# Patient Record
Sex: Male | Born: 1935
Health system: Southern US, Community
[De-identification: ages and names within clinical notes are randomized; demographics above are authoritative.]

## PROBLEM LIST (undated history)

## (undated) DIAGNOSIS — G4700 Insomnia, unspecified: Secondary | ICD-10-CM

## (undated) DIAGNOSIS — I491 Atrial premature depolarization: Secondary | ICD-10-CM

## (undated) DIAGNOSIS — D497 Neoplasm of unspecified behavior of endocrine glands and other parts of nervous system: Secondary | ICD-10-CM

## (undated) DIAGNOSIS — L309 Dermatitis, unspecified: Secondary | ICD-10-CM

## (undated) DIAGNOSIS — Z955 Presence of coronary angioplasty implant and graft: Secondary | ICD-10-CM

## (undated) DIAGNOSIS — M545 Low back pain, unspecified: Secondary | ICD-10-CM

## (undated) DIAGNOSIS — N400 Enlarged prostate without lower urinary tract symptoms: Secondary | ICD-10-CM

## (undated) DIAGNOSIS — M1711 Unilateral primary osteoarthritis, right knee: Secondary | ICD-10-CM

## (undated) DIAGNOSIS — E559 Vitamin D deficiency, unspecified: Secondary | ICD-10-CM

## (undated) DIAGNOSIS — I509 Heart failure, unspecified: Secondary | ICD-10-CM

## (undated) DIAGNOSIS — I2510 Atherosclerotic heart disease of native coronary artery without angina pectoris: Secondary | ICD-10-CM

## (undated) DIAGNOSIS — G8929 Other chronic pain: Secondary | ICD-10-CM

## (undated) DIAGNOSIS — M5136 Other intervertebral disc degeneration, lumbar region: Secondary | ICD-10-CM

## (undated) DIAGNOSIS — K635 Polyp of colon: Secondary | ICD-10-CM

## (undated) DIAGNOSIS — T7849XA Other allergy, initial encounter: Secondary | ICD-10-CM

## (undated) DIAGNOSIS — M51369 Other intervertebral disc degeneration, lumbar region without mention of lumbar back pain or lower extremity pain: Secondary | ICD-10-CM

## (undated) DIAGNOSIS — R413 Other amnesia: Secondary | ICD-10-CM

## (undated) DIAGNOSIS — R339 Retention of urine, unspecified: Secondary | ICD-10-CM

## (undated) DIAGNOSIS — N281 Cyst of kidney, acquired: Secondary | ICD-10-CM

## (undated) DIAGNOSIS — E785 Hyperlipidemia, unspecified: Secondary | ICD-10-CM

## (undated) DIAGNOSIS — I428 Other cardiomyopathies: Secondary | ICD-10-CM

## (undated) DIAGNOSIS — I214 Non-ST elevation (NSTEMI) myocardial infarction: Secondary | ICD-10-CM

## (undated) DIAGNOSIS — R768 Other specified abnormal immunological findings in serum: Secondary | ICD-10-CM

## (undated) DIAGNOSIS — H269 Unspecified cataract: Secondary | ICD-10-CM

## (undated) HISTORY — DX: Other chronic pain: G89.29

## (undated) HISTORY — DX: Unilateral primary osteoarthritis, right knee: M17.11

## (undated) HISTORY — DX: Cyst of kidney, acquired: N28.1

## (undated) HISTORY — DX: Hyperlipidemia, unspecified: E78.5

## (undated) HISTORY — DX: Other amnesia: R41.3

## (undated) HISTORY — DX: Other intervertebral disc degeneration, lumbar region without mention of lumbar back pain or lower extremity pain: M51.369

## (undated) HISTORY — DX: Other allergy, initial encounter: T78.49XA

## (undated) HISTORY — DX: Benign prostatic hyperplasia without lower urinary tract symptoms: N40.0

## (undated) HISTORY — DX: Atrial premature depolarization: I49.1

## (undated) HISTORY — DX: Retention of urine, unspecified: R33.9

## (undated) HISTORY — PX: OTHER SURGICAL HISTORY: SHX169

## (undated) HISTORY — DX: Polyp of colon: K63.5

## (undated) HISTORY — DX: Low back pain, unspecified: M54.50

## (undated) HISTORY — DX: Unspecified cataract: H26.9

## (undated) HISTORY — DX: Other intervertebral disc degeneration, lumbar region: M51.36

## (undated) HISTORY — DX: Dermatitis, unspecified: L30.9

## (undated) HISTORY — DX: Neoplasm of unspecified behavior of endocrine glands and other parts of nervous system: D49.7

## (undated) HISTORY — DX: Vitamin D deficiency, unspecified: E55.9

## (undated) HISTORY — DX: Atherosclerotic heart disease of native coronary artery without angina pectoris: I25.10

## (undated) HISTORY — DX: Other cardiomyopathies: I42.8

## (undated) HISTORY — DX: Presence of coronary angioplasty implant and graft: Z95.5

## (undated) HISTORY — PX: HAND SURGERY: SHX662

## (undated) HISTORY — PX: PITUITARY SURGERY: SHX203

## (undated) HISTORY — DX: Other specified abnormal immunological findings in serum: R76.8

## (undated) HISTORY — DX: Insomnia, unspecified: G47.00

## (undated) HISTORY — PX: BACK SURGERY: SHX140

## (undated) HISTORY — DX: Low back pain: M54.5

## (undated) HISTORY — PX: CATARACT EXTRACTION: SUR2

---

## 2000-03-13 ENCOUNTER — Encounter: Payer: Self-pay | Admitting: Orthopedic Surgery

## 2000-03-13 ENCOUNTER — Ambulatory Visit (HOSPITAL_COMMUNITY): Admission: RE | Admit: 2000-03-13 | Discharge: 2000-03-13 | Payer: Self-pay | Admitting: Orthopedic Surgery

## 2000-03-18 ENCOUNTER — Ambulatory Visit (HOSPITAL_COMMUNITY): Admission: RE | Admit: 2000-03-18 | Discharge: 2000-03-18 | Payer: Self-pay | Admitting: Orthopedic Surgery

## 2000-03-18 ENCOUNTER — Encounter: Payer: Self-pay | Admitting: Orthopedic Surgery

## 2003-03-01 ENCOUNTER — Emergency Department (HOSPITAL_COMMUNITY): Admission: EM | Admit: 2003-03-01 | Discharge: 2003-03-01 | Payer: Self-pay | Admitting: Emergency Medicine

## 2006-07-10 ENCOUNTER — Encounter: Admission: RE | Admit: 2006-07-10 | Discharge: 2006-07-10 | Payer: Self-pay | Admitting: Sports Medicine

## 2006-09-19 ENCOUNTER — Encounter: Admission: RE | Admit: 2006-09-19 | Discharge: 2006-09-19 | Payer: Self-pay | Admitting: Sports Medicine

## 2006-09-23 ENCOUNTER — Ambulatory Visit (HOSPITAL_COMMUNITY): Admission: RE | Admit: 2006-09-23 | Discharge: 2006-09-23 | Payer: Self-pay | Admitting: Neurological Surgery

## 2006-09-25 ENCOUNTER — Ambulatory Visit (HOSPITAL_COMMUNITY): Admission: RE | Admit: 2006-09-25 | Discharge: 2006-09-26 | Payer: Self-pay | Admitting: Neurological Surgery

## 2006-09-29 ENCOUNTER — Emergency Department (HOSPITAL_COMMUNITY): Admission: EM | Admit: 2006-09-29 | Discharge: 2006-09-29 | Payer: Self-pay | Admitting: Emergency Medicine

## 2006-12-04 ENCOUNTER — Ambulatory Visit (HOSPITAL_COMMUNITY): Admission: RE | Admit: 2006-12-04 | Discharge: 2006-12-04 | Payer: Self-pay | Admitting: Internal Medicine

## 2007-10-29 ENCOUNTER — Encounter: Admission: RE | Admit: 2007-10-29 | Discharge: 2007-10-29 | Payer: Self-pay | Admitting: Internal Medicine

## 2007-10-29 ENCOUNTER — Encounter: Admission: RE | Admit: 2007-10-29 | Discharge: 2007-10-29 | Payer: Self-pay | Admitting: Neurological Surgery

## 2008-01-06 ENCOUNTER — Encounter: Admission: RE | Admit: 2008-01-06 | Discharge: 2008-01-06 | Payer: Self-pay | Admitting: Cardiology

## 2008-01-12 ENCOUNTER — Inpatient Hospital Stay (HOSPITAL_BASED_OUTPATIENT_CLINIC_OR_DEPARTMENT_OTHER): Admission: RE | Admit: 2008-01-12 | Discharge: 2008-01-12 | Payer: Self-pay | Admitting: Cardiology

## 2008-01-30 DIAGNOSIS — E559 Vitamin D deficiency, unspecified: Secondary | ICD-10-CM

## 2008-01-30 HISTORY — DX: Vitamin D deficiency, unspecified: E55.9

## 2008-09-03 ENCOUNTER — Ambulatory Visit: Payer: Self-pay | Admitting: Cardiology

## 2008-09-06 ENCOUNTER — Encounter: Payer: Self-pay | Admitting: Cardiology

## 2008-09-06 ENCOUNTER — Ambulatory Visit: Payer: Self-pay

## 2008-09-22 ENCOUNTER — Ambulatory Visit: Payer: Self-pay | Admitting: Cardiology

## 2009-01-10 ENCOUNTER — Emergency Department (HOSPITAL_COMMUNITY): Admission: EM | Admit: 2009-01-10 | Discharge: 2009-01-10 | Payer: Self-pay | Admitting: Emergency Medicine

## 2009-02-04 ENCOUNTER — Encounter: Payer: Self-pay | Admitting: Cardiology

## 2009-02-10 ENCOUNTER — Ambulatory Visit: Payer: Self-pay | Admitting: Cardiology

## 2009-02-10 DIAGNOSIS — I459 Conduction disorder, unspecified: Secondary | ICD-10-CM | POA: Insufficient documentation

## 2009-02-10 DIAGNOSIS — I2510 Atherosclerotic heart disease of native coronary artery without angina pectoris: Secondary | ICD-10-CM | POA: Insufficient documentation

## 2009-02-10 DIAGNOSIS — I429 Cardiomyopathy, unspecified: Secondary | ICD-10-CM | POA: Insufficient documentation

## 2009-02-10 DIAGNOSIS — E7800 Pure hypercholesterolemia, unspecified: Secondary | ICD-10-CM | POA: Insufficient documentation

## 2009-02-10 DIAGNOSIS — I498 Other specified cardiac arrhythmias: Secondary | ICD-10-CM | POA: Insufficient documentation

## 2009-02-17 ENCOUNTER — Ambulatory Visit: Payer: Self-pay | Admitting: Cardiology

## 2009-07-13 ENCOUNTER — Encounter
Admission: RE | Admit: 2009-07-13 | Discharge: 2009-07-15 | Payer: Self-pay | Admitting: Physical Medicine & Rehabilitation

## 2009-07-15 ENCOUNTER — Ambulatory Visit: Payer: Self-pay | Admitting: Physical Medicine & Rehabilitation

## 2009-08-03 ENCOUNTER — Ambulatory Visit: Payer: Self-pay

## 2009-08-03 ENCOUNTER — Ambulatory Visit (HOSPITAL_COMMUNITY): Admission: RE | Admit: 2009-08-03 | Discharge: 2009-08-03 | Payer: Self-pay | Admitting: Cardiology

## 2009-08-03 ENCOUNTER — Encounter: Payer: Self-pay | Admitting: Cardiology

## 2009-08-03 ENCOUNTER — Ambulatory Visit: Payer: Self-pay | Admitting: Cardiovascular Disease

## 2009-08-17 ENCOUNTER — Ambulatory Visit: Payer: Self-pay | Admitting: Cardiology

## 2009-10-01 DIAGNOSIS — D497 Neoplasm of unspecified behavior of endocrine glands and other parts of nervous system: Secondary | ICD-10-CM

## 2009-10-01 HISTORY — DX: Neoplasm of unspecified behavior of endocrine glands and other parts of nervous system: D49.7

## 2009-11-15 ENCOUNTER — Encounter: Admission: RE | Admit: 2009-11-15 | Discharge: 2009-11-15 | Payer: Self-pay | Admitting: Neurology

## 2009-11-30 ENCOUNTER — Telehealth: Payer: Self-pay | Admitting: Cardiology

## 2009-12-01 ENCOUNTER — Encounter (INDEPENDENT_AMBULATORY_CARE_PROVIDER_SITE_OTHER): Payer: Self-pay | Admitting: Neurological Surgery

## 2009-12-01 ENCOUNTER — Inpatient Hospital Stay (HOSPITAL_COMMUNITY): Admission: RE | Admit: 2009-12-01 | Discharge: 2009-12-07 | Payer: Self-pay | Admitting: Neurological Surgery

## 2010-01-05 ENCOUNTER — Ambulatory Visit (HOSPITAL_COMMUNITY): Admission: RE | Admit: 2010-01-05 | Discharge: 2010-01-05 | Payer: Self-pay | Admitting: Internal Medicine

## 2010-02-20 ENCOUNTER — Encounter: Admission: RE | Admit: 2010-02-20 | Discharge: 2010-02-20 | Payer: Self-pay | Admitting: Neurological Surgery

## 2010-10-23 ENCOUNTER — Ambulatory Visit
Admission: RE | Admit: 2010-10-23 | Discharge: 2010-10-23 | Payer: Self-pay | Source: Home / Self Care | Attending: Cardiology | Admitting: Cardiology

## 2010-10-23 ENCOUNTER — Encounter: Payer: Self-pay | Admitting: Cardiology

## 2010-10-27 ENCOUNTER — Encounter: Payer: Self-pay | Admitting: Cardiology

## 2010-10-31 NOTE — Progress Notes (Signed)
Summary: surgery  Phone Note Call from Patient Call back at Home Phone [phone]   Caller: Patient Reason for Call: Talk to Nurse Summary of Call: having surgery on 3/3 @ [facility], pituitary gland Initial call taken by: Migdalia Dk,  November 30, 2009 1:24 PM  Follow-up for Phone Call        I spoke with the pt and he has been having vision loss.  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  Dr Annalee Genta and Dr [name] will be performing 3 hour surgery on 12/01/09 at [facility].  The pt will be hospitalized 3-4 days. The pt wanted Dr Riley Kill to know this information.  Follow-up by: Julieta Gutting, RN, BSN,  November 30, 2009 2:50 PM  Additional Follow-up for Phone Call Additional follow up Details #1::        Noted.  Will check on patient at the hospital Additional Follow-up by: Ronaldo Miyamoto, MD, [facility],  December 01, 2009 4:59 PM

## 2010-11-02 NOTE — Assessment & Plan Note (Signed)
Summary: 1 YR F/U   Visit Type:  1 year follow up Primary Provider:  Dr.Perini  CC:  No complaints.  History of Present Illness: He is getting along really well at this point.  He had pituitary surgery, and he is gotten over that.  In light of his symptoms, Dr. Waynard Edwards was reconsidering his anti lipemic treatment, but the patient still has medication related side effects, and is very sensitive to most medications.   Problems Prior to Update: 1)  Cardiomyopathy, Primary, Dilated  (ICD-425.4) 2)  Cad, Native Vessel  (ICD-414.01) 3)  Hypercholesterolemia Iia  (ICD-272.0) 4)  Bradycardia  (ICD-427.89)  Current Medications (verified): 1)  Lovaza 1 Gm Caps (Omega-3-Acid Ethyl Esters) .... 4 Once Daily 2)  Finasteride 5 Mg Tabs (Finasteride) .... Take 1 Tablet By Mouth Once A Day 3)  Aspirin 81 Mg Tbec (Aspirin) .... Take One Tablet By Mouth Daily 4)  Vitamin D 50,000 .... Once A Week 5)  Glucosamine Sulfate   Powd (Glucosamine Sulfate) .Marland Kitchen.. 1 By Mouth Once Daily 6)  Cinnamon 500 Mg Caps (Cinnamon) .Marland Kitchen.. 1 By Mouth Once Daily 7)  Mega Multi Men  Cr-Tabs (Multiple Vitamins-Minerals) .Marland Kitchen.. 1 By Mouth Once Daily 8)  Coq10 200 Mg Caps (Coenzyme Q10) .... Take 1 Capsule By Mouth Once A Day  Allergies: 1)  ! * Flomax 2)  ! Sulfa  Past History:  Past Medical History: Last updated: 09/30/2008 hyperlipidemia Benign Prostatic Hypertrophy Non ischemic cardiomyopathy Atrial premature beats Coronary Artery Disease  Past Surgical History: Left hand surgery cataract extraction prior back surgery cataract extraction arthroscopic surgery bunion and hammar toe surgery pituitary surgery  Family History: Reviewed history from 09/30/2008 and no changes required. Father age 72 deceased CHF Mother age 65 deceased old age Brother  history of CAD Sister deceased 66 polio Brother and sister alive  Social History: Reviewed history from 09/30/2008 and no changes required. Nonsmoker, married,  retired Associate Professor  Vital Signs:  Patient profile:   77 year old male Height:      75 inches Weight:      253.75 pounds BMI:     31.83 Pulse rate:   75 / minute Pulse rhythm:   irregular Resp:     18 per minute BP sitting:   128 / 82  (left arm) Cuff size:   large  Vitals Entered By: Vikki Ports (October 23, 2010 9:21 AM)  Physical Exam  General:  Well developed, well nourished, in no acute distress. Head:  normocephalic and atraumatic Eyes:  PERRLA/EOM intact; conjunctiva and lids normal. Lungs:  Clear bilaterally to auscultation and percussion. Heart:  PMI non displaced. NOrmal S1 and S2.  S4 gallop.  No definite murmur. Pulses:  pulses normal in all 4 extremities Extremities:  No clubbing or cyanosis. Neurologic:  Alert and oriented x 3.   EKG  Procedure date:  10/23/2010  Findings:      NSR  Atrial bigeminy and PACs  Impression & Recommendations:  Problem # 1:  CARDIOMYOPATHY, PRIMARY, DILATED (ICD-425.4) Out of proportion to CAD findings.  Continue to monitor.  Will need repeat echocardiogram to follow. His updated medication list for this problem includes:    Aspirin 81 Mg Tbec (Aspirin) .Marland Kitchen... Take one tablet by mouth daily  Orders: EKG w/ Interpretation (93000)  Problem # 2:  CAD, NATIVE VESSEL (ICD-414.01) Has been stable.  Unable to take statins.  continues on ASA His updated medication list for this problem includes:    Aspirin 81 Mg Tbec (  Aspirin) .Marland Kitchen... Take one tablet by mouth daily  Orders: EKG w/ Interpretation (93000)  Problem # 3:  HYPERCHOLESTEROLEMIA  IIA (ICD-272.0) difficult treatment issues secondary to medication side effects. His updated medication list for this problem includes:    Lovaza 1 Gm Caps (Omega-3-acid ethyl esters) .Marland KitchenMarland KitchenMarland KitchenMarland Kitchen 4 once daily  Orders: EKG w/ Interpretation (93000)  Problem # 4:  BRADYCARDIA (ICD-427.89) not currently a clinical issue. His updated medication list for this problem includes:    Aspirin 81 Mg  Tbec (Aspirin) .Marland Kitchen... Take one tablet by mouth daily  Orders: EKG w/ Interpretation (93000)  Patient Instructions: 1)  Your physician recommends that you continue on your current medications as directed. Please refer to the Current Medication list given to you today. 2)  Your physician wants you to follow-up in: 6 MONTHS.  You will receive a reminder letter in the mail two months in advance. If you don't receive a letter, please call our office to schedule the follow-up appointment. 3)  Your physician has requested that you have an echocardiogram (prior to orthopaedic surgery--please call the office once surgery is scheduled).  Echocardiography is a painless test that uses sound waves to create images of your heart. It provides your doctor with information about the size and shape of your heart and how well your heart's chambers and valves are working.  This procedure takes approximately one hour. There are no restrictions for this procedure.

## 2010-12-24 LAB — CBC
HCT: 37.6 % — ABNORMAL LOW (ref 39.0–52.0)
HCT: 43.1 % (ref 39.0–52.0)
HCT: 45.3 % (ref 39.0–52.0)
Hemoglobin: 13.1 g/dL (ref 13.0–17.0)
Hemoglobin: 15 g/dL (ref 13.0–17.0)
Hemoglobin: 15.1 g/dL (ref 13.0–17.0)
MCHC: 34.3 g/dL (ref 30.0–36.0)
MCHC: 34.9 g/dL (ref 30.0–36.0)
MCV: 96.2 fL (ref 78.0–100.0)
Platelets: 174 10*3/uL (ref 150–400)
Platelets: 174 10*3/uL (ref 150–400)
RBC: 4.11 MIL/uL — ABNORMAL LOW (ref 4.22–5.81)
RBC: 4.45 MIL/uL (ref 4.22–5.81)
RDW: 13.6 % (ref 11.5–15.5)
RDW: 13.9 % (ref 11.5–15.5)
WBC: 14.1 10*3/uL — ABNORMAL HIGH (ref 4.0–10.5)
WBC: 7.3 10*3/uL (ref 4.0–10.5)

## 2010-12-24 LAB — BASIC METABOLIC PANEL
BUN: 10 mg/dL (ref 6–23)
BUN: 10 mg/dL (ref 6–23)
CO2: 14 mEq/L — ABNORMAL LOW (ref 19–32)
CO2: 26 mEq/L (ref 19–32)
CO2: 29 mEq/L (ref 19–32)
Calcium: 8.4 mg/dL (ref 8.4–10.5)
Calcium: 8.6 mg/dL (ref 8.4–10.5)
Calcium: 9.8 mg/dL (ref 8.4–10.5)
Chloride: 102 mEq/L (ref 96–112)
Chloride: 104 mEq/L (ref 96–112)
Chloride: 107 mEq/L (ref 96–112)
Creatinine, Ser: 0.85 mg/dL (ref 0.4–1.5)
Creatinine, Ser: 0.9 mg/dL (ref 0.4–1.5)
Creatinine, Ser: 0.94 mg/dL (ref 0.4–1.5)
GFR calc Af Amer: >60 mL/min (ref >60)
GFR calc non Af Amer: >60 mL/min (ref >60)
GFR calc non Af Amer: >60 mL/min (ref >60)
Glucose, Bld: 150 mg/dL — ABNORMAL HIGH (ref 70–99)
Glucose, Bld: 98 mg/dL (ref 70–99)
Potassium: 4.4 mEq/L (ref 3.5–5.1)
Sodium: 139 mEq/L (ref 135–145)

## 2011-01-01 ENCOUNTER — Encounter: Payer: Self-pay | Admitting: Cardiology

## 2011-01-12 ENCOUNTER — Other Ambulatory Visit (HOSPITAL_COMMUNITY): Payer: Self-pay | Admitting: Cardiology

## 2011-01-12 DIAGNOSIS — I428 Other cardiomyopathies: Secondary | ICD-10-CM

## 2011-01-15 ENCOUNTER — Ambulatory Visit (HOSPITAL_COMMUNITY): Payer: Medicare Other | Attending: Cardiology | Admitting: Radiology

## 2011-01-15 DIAGNOSIS — I428 Other cardiomyopathies: Secondary | ICD-10-CM

## 2011-01-15 DIAGNOSIS — I2510 Atherosclerotic heart disease of native coronary artery without angina pectoris: Secondary | ICD-10-CM

## 2011-01-15 DIAGNOSIS — Z01810 Encounter for preprocedural cardiovascular examination: Secondary | ICD-10-CM | POA: Insufficient documentation

## 2011-01-30 ENCOUNTER — Encounter: Payer: Self-pay | Admitting: Cardiology

## 2011-01-31 ENCOUNTER — Encounter: Payer: Self-pay | Admitting: Cardiology

## 2011-01-31 ENCOUNTER — Ambulatory Visit (INDEPENDENT_AMBULATORY_CARE_PROVIDER_SITE_OTHER): Payer: Medicare Other | Admitting: Cardiology

## 2011-01-31 VITALS — BP 144/72 | HR 67 | Resp 14 | Ht 75.0 in | Wt 254.8 lb

## 2011-01-31 DIAGNOSIS — E7800 Pure hypercholesterolemia, unspecified: Secondary | ICD-10-CM

## 2011-01-31 DIAGNOSIS — I2510 Atherosclerotic heart disease of native coronary artery without angina pectoris: Secondary | ICD-10-CM

## 2011-01-31 DIAGNOSIS — I428 Other cardiomyopathies: Secondary | ICD-10-CM

## 2011-01-31 NOTE — Patient Instructions (Signed)
Your physician recommends that you schedule a follow-up appointment in: 6 months with Dr. Stuckey   

## 2011-02-08 NOTE — Assessment & Plan Note (Signed)
See results of his echo.  His overall LVEF is not too far down.  He also has some RV dysfunction.  Fluid during surgery will need to be monitored closely.

## 2011-02-08 NOTE — Assessment & Plan Note (Signed)
Has not been able to take statins without significant side effects.  Dr. Waynard Edwards has worked closely with him on this.  No change.

## 2011-02-08 NOTE — Assessment & Plan Note (Signed)
The patient has class I NYHA symptoms.  He can exercise at the 4 mets level without symptoms.  He has known disease without symptoms.  Given a cath within a three year period, and echo done recently, no other workup is indicated.  I explained in part the futility of preop testing to ensure no events, and the guidelines per [facility] regarding this.  He has some inflammatory risk with the procedure, and has known disease without aggressive prevention due to lack of medication tolerance.  However, this is not per se a contraindication to proceeding. Quality of life is substantially impacted, and he has been working on other things as well.    I have spoken with Dr. Waynard Edwards who is aware of his needs for stress steroids.  Dr. [name] is also aware of this.  We would be glad to see him, although I will be away during his hospitalization.  [facility] will be available.

## 2011-02-08 NOTE — Progress Notes (Signed)
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  He is seen prior to having an orthopedic procedure, specifically a TKR.  He has been doing reasonably well. 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  He has no chest pain whatsoever.  The patient has significant medical issues.  Of note, he has had pituitary surgery, and is on replacement hormonal therapy that Dr. Waynard Edwards manages.  He denies any chest pain.  He has known CAD, and has not been able to take statins despite substantial hyperlipidemia.  A lot has been tried by Dr. Waynard Edwards over a period of time. He also has a mild cardiomyopathy out of proportion to his CAD findings.  These are noted as follows:  January 15, 2011:  Echo: Study Conclusions    - Left ventricle: Thereis inferior hypokinesis. The EF is in the     50-55% range. The cavity size was mildly dilated. Wall thickness     was increased in a pattern of mild LVH. Doppler parameters are     consistent with abnormal left ventricular relaxation (grade 1     diastolic dysfunction). Doppler parameters are consistent with     high ventricular filling pressure   He has had prior cath 01/12/2008 by Dr. Donnie Aho  .ANGIOGRAPHIC DATA:  Left ventriculogram:  Performed in the 30-degree RAO   projection.  The aortic valve is normal.  The mitral valve is normal.   There are frequent ventricular ectopic beats noted most likely PACs.   There was mild diffuse hypokinesis noted with an estimated ejection   fraction between 40% and 45% with some accentuation of the EF post   premature beat.  Coronary arteries arise and distribute normally.  There   was calcification noted in the left coronary system.  The left main   coronary artery is normal.  The left anterior descending is calcified   proximally.  There is an area of diffuse narrowing in the proximal and   mid segment.  There is a focal area, which is eccentric seen best in the   LAO cranial  view estimated at no greater than 50%.  There is diffuse   disease in the midportion estimated at 30%.  There is a focal eccentric   stenosis at the very distal apex estimated at 60%.  Circumflex coronary   artery contains moderate irregularity, but no significant focal   obstructive stenoses are noted.  Several marginal branches arise.  The   right coronary artery is a dominant vessel with scattered irregularity   as the posterior descending and posterolateral branches contain   scattered irregularities with no significant disease.      IMPRESSION:   1. Mild reduction in left ventricular ejection fraction.   2. Coronary artery disease predominantly involving the left anterior       descending in moderate fashion.  No severe       focal obstructive stenoses were noted that would be suitable for       intervention.  There is only mild irregularity in the other       vessels.      RECOMMENDATIONS:  Intensive medical therapy, initiate ACE inhibitor.               Georga Hacking, M.D.    - Aortic valve: Sclerosis without stenosis.   - Left atrium: The atrium was moderately to severely dilated.   -  Right ventricle: RV is not seen well. There is suggestion of some     RV dilitation and some RV dysfunction.  He denies anginal chest pain, or progressive shortness of breath.  He does not have orthopnea.  He has no other cardiac symptoms.        Current Outpatient Prescriptions  Medication Sig Dispense Refill  . aspirin 81 MG tablet Take 81 mg by mouth daily.        . Cinnamon 500 MG capsule Take 500 mg by mouth daily.        . Coenzyme Q10 (COQ10) 200 MG CAPS Take 1 capsule by mouth daily.        . ergocalciferol (VITAMIN D2) 50000 UNITS capsule Take 50,000 Units by mouth once a week.        . fenofibrate 160 MG tablet Take 1 tablet by mouth Daily.      . finasteride (PROSCAR) 5 MG tablet Take 5 mg by mouth daily.        . Glucosamine Sulfate 1000 MG CAPS Take 1 capsule by mouth  daily.        . hydrocortisone (CORTEF) 10 MG tablet One and on half tablet daily      . levothyroxine (SYNTHROID, LEVOTHROID) 75 MCG tablet As directed. One tablet every other day. One and one half the other days.      . Multiple Vitamins-Minerals (MEGA MULTI MEN PO) Take 1 tablet by mouth daily.        Marland Kitchen omega-3 acid ethyl esters (LOVAZA) 1 G capsule Take by mouth. Take 4 caps daily       . testosterone cypionate (DEPOTESTOTERONE CYPIONATE) 200 MG/ML injection Inject into the muscle every 14 (fourteen) days. 1.62 % injection         Allergies  Allergen Reactions  . Sulfonamide Derivatives   . Tamsulosin     Past Medical History  Diagnosis Date  . Hyperlipidemia   . BPH (benign prostatic hypertrophy)   . Non-ischemic cardiomyopathy   . Atrial premature beats   . Coronary artery disease     Past Surgical History  Procedure Date  . Hand surgery     left  . Cataract extraction   . Back surgery   . Hammer  bunion toe surgery   . Pituitary surgery     12/01/09 Dr. Annalee Genta and Dr. [name].  . Arthroscopic surgery     Family History  Problem Relation Age of Onset  . Heart failure Father 28    deceased  . Other Mother 47    deceased old age  . Coronary artery disease Brother   . Other Sister 27    polio    History   Social History  . Marital Status: Married    Spouse Name: N/A    Number of Children: N/A  . Years of Education: N/A   Occupational History  . retired Associate Professor    Social History Main Topics  . Smoking status: Never Smoker   . Smokeless tobacco: Not on file  . Alcohol Use: No  . Drug Use: No  . Sexually Active: Not on file   Other Topics Concern  . Not on file   Social History Narrative  . No narrative on file    ROS: Please see the HPI.  All other systems reviewed and negative.  PHYSICAL EXAM:  BP 144/72  Pulse 67  Resp 14  Ht 6\' 3"  (1.905 m)  Wt 254 lb 12.8 oz (115.577 kg)  BMI 31.85 kg/m2  General: Well developed, well  nourished, in no acute distress. Head:  Normocephalic and atraumatic. Neck: no JVD Lungs: Clear to auscultation and percussion. Heart: Normal S1 and S2.  No murmur, rubs or gallops.  Abdomen:  Normal bowel sounds; soft; non tender; no organomegaly Pulses: Pulses normal in all 4 extremities. Extremities: No clubbing or cyanosis. No edema. Neurologic: Alert and oriented x 3.  EKG:  NSR with PACs, frequent.  No acute changes.    ASSESSMENT AND PLAN:

## 2011-02-09 ENCOUNTER — Other Ambulatory Visit: Payer: Self-pay | Admitting: Neurological Surgery

## 2011-02-09 DIAGNOSIS — D497 Neoplasm of unspecified behavior of endocrine glands and other parts of nervous system: Secondary | ICD-10-CM

## 2011-02-13 ENCOUNTER — Other Ambulatory Visit: Payer: Self-pay | Admitting: Orthopedic Surgery

## 2011-02-13 ENCOUNTER — Other Ambulatory Visit (HOSPITAL_COMMUNITY): Payer: Self-pay | Admitting: Orthopedic Surgery

## 2011-02-13 ENCOUNTER — Encounter (HOSPITAL_COMMUNITY): Payer: Medicare Other

## 2011-02-13 ENCOUNTER — Ambulatory Visit (HOSPITAL_COMMUNITY)
Admission: RE | Admit: 2011-02-13 | Discharge: 2011-02-13 | Disposition: A | Discharge status: Home / Self Care | Payer: Medicare Other | Source: Ambulatory Visit | Attending: Orthopedic Surgery | Admitting: Orthopedic Surgery

## 2011-02-13 DIAGNOSIS — I77819 Aortic ectasia, unspecified site: Secondary | ICD-10-CM | POA: Insufficient documentation

## 2011-02-13 DIAGNOSIS — Z01811 Encounter for preprocedural respiratory examination: Secondary | ICD-10-CM

## 2011-02-13 DIAGNOSIS — M1710 Unilateral primary osteoarthritis, unspecified knee: Secondary | ICD-10-CM | POA: Insufficient documentation

## 2011-02-13 DIAGNOSIS — Z01818 Encounter for other preprocedural examination: Secondary | ICD-10-CM | POA: Insufficient documentation

## 2011-02-13 DIAGNOSIS — Z01812 Encounter for preprocedural laboratory examination: Secondary | ICD-10-CM | POA: Insufficient documentation

## 2011-02-13 LAB — COMPREHENSIVE METABOLIC PANEL
ALT: 32 U/L (ref 0–53)
AST: 29 U/L (ref 0–37)
Alkaline Phosphatase: 65 U/L (ref 39–117)
CO2: 27 mEq/L (ref 19–32)
Calcium: 9.2 mg/dL (ref 8.4–10.5)
Chloride: 103 mEq/L (ref 96–112)
GFR calc Af Amer: >60 mL/min (ref >60)
GFR calc non Af Amer: >60 mL/min (ref >60)
Potassium: 4.4 mEq/L (ref 3.5–5.1)
Sodium: 138 mEq/L (ref 135–145)

## 2011-02-13 LAB — URINALYSIS, ROUTINE W REFLEX MICROSCOPIC
Bilirubin Urine: NEGATIVE
Glucose, UA: NEGATIVE mg/dL
Hgb urine dipstick: NEGATIVE
Ketones, ur: NEGATIVE mg/dL
Protein, ur: NEGATIVE mg/dL
Urobilinogen, UA: 0.2 mg/dL (ref 0.0–1.0)

## 2011-02-13 LAB — CBC
HCT: 51.9 % (ref 39.0–52.0)
Hemoglobin: 17.6 g/dL — ABNORMAL HIGH (ref 13.0–17.0)
MCH: 30.5 pg (ref 26.0–34.0)
MCHC: 33.9 g/dL (ref 30.0–36.0)
MCV: 89.9 fL (ref 78.0–100.0)
RDW: 14.3 % (ref 11.5–15.5)

## 2011-02-13 LAB — PROTIME-INR: Prothrombin Time: 14.2 seconds (ref 11.6–15.2)

## 2011-02-13 NOTE — Cardiovascular Report (Signed)
NAMEKRISS, [name]                   ACCOUNT NO.:  [id number]   MEDICAL RECORD NO.:  [id number]          PATIENT TYPE:  OIB   LOCATION:  1961                         FACILITY:  [facility]   PHYSICIAN:  Georga Hacking, M.D.DATE OF BIRTH:  10-08-1933   DATE OF PROCEDURE:  DATE OF DISCHARGE:  01/12/2008                            CARDIAC CATHETERIZATION   HISTORY:  The patient is a 77 year old male who has had some vague  fatigue.  He has a significant family history of heart disease and  familial hypercholesterolemia.  Cardiolite testing showed a diminished  ejection fraction, but no evidence of ischemia.  Catheterization was  done to assess the etiology of the diminished ejection fraction.   PROCEDURE:  Left heart catheterization with coronary angiograms and left  ventriculogram.   PROCEDURE:  The patient tolerated the procedure well without  complications.  The right femoral artery was entered using a single  anterior needle wall stick and a 4-French sheath was placed in the right  femoral artery.  The procedure was done through 4-French catheters and a  30 mL ventriculogram was performed following the end of the procedure.  The sheath was removed with manual pressure.   HEMODYNAMIC DATA:  Aorta postcontrast 123/57 and LV postcontrast 123/6-  12.   ANGIOGRAPHIC DATA:  Left ventriculogram:  Performed in the 30-degree RAO  projection.  The aortic valve is normal.  The mitral valve is normal.  There are frequent ventricular ectopic beats noted most likely PACs.  There was mild diffuse hypokinesis noted with an estimated ejection  fraction between 40% and 45% with some accentuation of the EF post  premature beat.  Coronary arteries arise and distribute normally.  There  was calcification noted in the left coronary system.  The left main  coronary artery is normal.  The left anterior descending is calcified  proximally.  There is an area of diffuse narrowing in the proximal and  mid  segment.  There is a focal area, which is eccentric seen best in the  LAO cranial view estimated at no greater than 50%.  There is diffuse  disease in the midportion estimated at 30%.  There is a focal eccentric  stenosis at the very distal apex estimated at 60%.  Circumflex coronary  artery contains moderate irregularity, but no significant focal  obstructive stenoses are noted.  Several marginal branches arise.  The  right coronary artery is a dominant vessel with scattered irregularity  as the posterior descending and posterolateral branches contain  scattered irregularities with no significant disease.   IMPRESSION:  1. Mild reduction in left ventricular ejection fraction.  2. Coronary artery disease predominantly involving the left anterior      descending in moderate fashion.  No severe      focal obstructive stenoses were noted that would be suitable for      intervention.  There is only mild irregularity in the other      vessels.   RECOMMENDATIONS:  Intensive medical therapy, initiate ACE inhibitor.      Georga Hacking, M.D.  Electronically Signed  WST/MEDQ  D:  01/12/2008  T:  01/12/2008  Job:  [id number]   cc:   Loraine Leriche A. Perini, M.D.

## 2011-02-13 NOTE — Assessment & Plan Note (Signed)
Olsen Olsen                            CARDIOLOGY OFFICE NOTE   NAME:Olsen Olsen, Olsen                          MRN:          [id number]  DATE:09/10/2008                            DOB:          02-11-1934    REFERRING PHYSICIAN:  Loraine Leriche A. Perini, M.D.   CHIEF COMPLAINT:  Cardiomyopathy.   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  He actually is doing reasonably well.  He had seen Dr. [name] in  late 2007, and at that time he had some back surgery.  He was seen by  [name] at that point in late December and he had been a patient of  [name].  At the time that he had a surgery, which was near  Christmas time in December 2007, the patient was in pretty severe pain.  He wanted to go ahead right away because he had been unable to lie down  for over 10 days due to his back pain.  He had been given some  prednisone, and then developed some lower extremity swelling.  Eventually, a cardiac evaluation ensued which revealed a reduced  ejection fraction by echo.  We do not have availability of the echo.  He  was seen by [name] at that time, and a Cardiolite testing  apparently showed diminished ejection fraction, but without evidence of  ischemia.  Because of that, he ultimately underwent cardiac  catheterization.  This was done in April 2009.  There were frequent  ventricular ectopic beats, and it also should be noted the he was noted  to have frequent skips.  He was thought at that point to have mild  diffuse hypokinesis with an ejection fraction of 40-45%.  There was a  normal distribution of the coronary arteries by the report and the LAD  was calcified with diffuse narrowing in the proximal and midsegment, but  there was not felt to be in excess of 50%.  There was an apical stenosis  of 60%.  There were no significant focal obstructions  in the circumflex,  and the right was a dominant vessel with scattered irregularities.  As a  result, it was not felt that a coronary disease was the etiology of his  LV dysfunction.  The actual reason for this was uncertain.  The patient  since then has been tried on a combination of variety of medications for  treatment.  He was started on some carvedilol, as well as lisinopril and  with this, he had problems with his vision.  He basically could not see  he was in [name] at that time.  He also had some problems after missing  [name] where he had some trouble with Flomax and apparently with  Avodart likewise.  He ultimately stopped his Flomax.  Similarly, he has  experienced some visual difficulties with statins as well.  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  His blood pressures at home have really been  relatively adequate, and with all of these issues we have discussed, he  asked about the possibility of being seen.   PAST MEDICAL HISTORY:  Remarkable for his neurosurgical evaluation.  He  has been seen subsequently by Dr. [name] who felt that repeat surgery  would be of no benefit to him.  He has had arthroscopic surgery on his  right knee twice.  He has had modest fatigue, and arthritis involving  what he describes as the lower back.   MEDICATIONS:  1. Lovaza 4 tablets daily.  2. Finasteride 5 mg daily.  3. Aspirin 81 mg daily.  4. Vitamin D weekly.  5. Glucosamine chondroitin sulfate.  6. Cinnamon 500 mg daily.  7. Multivitamin daily.   FAMILY HISTORY:  Remarkable for mother who died at 30 of old age.  His  father died at 40 and had some a congestive heart failure.  He did have  a sister who drowned.   REVIEW OF SYSTEMS:  The patient has had fatigue.  He has not had  particular problems with breathing.  He denies any history of  significant chest pain.  He has had palpitations.  His weight has been   relatively stable.  Review of systems also reveals the fact that he had  a thumb injury previously.  He had acute urinary retention, Dr. [name]  notes also list fatty liver infiltration with gallbladder sludge.  He  has had colonoscopy in 1997 and 2008.  He has had negative stool cards.  Lipitor resulted in upset stomach as did Crestor, finasteride, and  Uroxatral all affected his vision.   PHYSICAL EXAMINATION:  VITAL SIGNS:  His weight is 255 pounds.  Blood  pressure 134/68, equal and bilaterally, and the pulse is 78.  The  carotid upstrokes are brisk.  LUNGS:  Clear to auscultation and percussion.  The PMI is not displaced.  There is a normal first heart sound and second heart sound.  There is  frequent ectopy with what sounds like atrial bigeminy.  The abdomen is  soft without any obvious hepatosplenomegaly.  He has varicosities, but  no significant extremity edema.   The electrocardiogram demonstrates normal sinus rhythm.  There are no Q  waves.  There are frequent atrial premature beats.   IMPRESSION:  1. Nonischemic cardiomyopathy of uncertain etiology.  2. History of prior back surgery.  3. History of multiple arthroscopic surgeries.  4. Mild coronary artery disease by cardiac catheterization previously.  5. History of frequent atrial ectopy.   RECOMMENDATIONS:  1. We had an extensive discussion about the benefit of potential      weight loss as it relates to his inability to take Crestor,      cardiovascular risk factor reduction, and potential options in that      regard.  2. We had a thorough discussion regarding his multiple drug issues,      and the fact that many drugs affect him adversely and therefore not      optimal on his long-term management.  3. We talked about the possibility of referral for management of his      hyperlipidemia with regard to specific options.  We talked about      various options in that regard.  4. We discussed the potential role of  electrophysiologic evaluation.  5. I do feel that repeat echocardiography would be helpful and they  would be helpful to obtain his records from [name] office      specifically in that regard.   We will see him back in followup in a month or so to try to review all  of this once we have had an opportunity to peer at his prior records.     Arturo Morton. Riley Kill, MD, [facility]  Electronically Signed    TDS/MedQ  DD: 09/10/2008  DT: 09/10/2008  Job #: [id number]

## 2011-02-16 NOTE — H&P (Signed)
[name], [name]                   ACCOUNT NO.:  [id number]   MEDICAL RECORD NO.:  [id number]          PATIENT TYPE:  OIB   LOCATION:  3007                         FACILITY:  [facility]   PHYSICIAN:  Stefani Dama, M.D.  DATE OF BIRTH:  [birth year]   DATE OF ADMISSION:  09/25/2006  DATE OF DISCHARGE:                              HISTORY & PHYSICAL   ADMISSION DIAGNOSIS:  Herniated nucleus pulposus with left lumbar  radiculopathy L5 distribution.   HISTORY OF PRESENT ILLNESS:  Mr. [name] is a 77 year old individual  who for the last 2 weeks has had substantial pain un the back and left  lower extremity.  He notes that about a week ago the pain became so  severe and unrelenting he was not able to sleep or rest in any  comfortable position.  I saw him on December 24th, emergently in the  office and the patient had evidence of a foot drop on that left side.  He had an MRI performed in October because of an episode of centralized  back pain with some radiation and the scan demonstrated the presence of  marked disk degeneration with a small central herniation of the disk at  L4-L5 and moderate degeneration at the L5-S1 level without any evidence  of any disk herniation.  Because of the notable change and the  complaints of severe radiculopathy, a new MRI was performed on the  evening of the 24th and this demonstrated the presence of a substantial  disk herniation in the left lateral gutter compressing the L5 nerve  root.  This appears to have come from the L5-S1 disk space and traveled  cephalad.  There is also some increase in the size of the disk  protrusion at the L4-L5 level.  Because of the patients severe  radiculopathy, he is now being admitted to undergo surgical  decompression of the L4-5 and the L5-S1 space.   PAST MEDICAL HISTORY:  Reveals that his general health has been very  good.  He reports no medical problems whatsoever and takes no medication  on a regular basis.   In  the recent past has been taking Vicodin for pain control and he also  had been given some Flexeril.  I had prescribed the patient some  Percocet and Valium to make him comfortable over the Christmas holiday.   THE PATIENT NOTES NO ALLERGIES TO AN Y MEDICATIONS.   His only other past surgery has been some knee and foot surgery on that  right leg a couple of years ago by Dr. Salvatore Marvel.   PERSONAL HISTORY:  He does not smoke.  He drinks alcohol on a social  basis.  His height and weight have been stable at 6 feet 3 inches, 225  pounds.   His systems review is notable only for the complaints of back pain, left  lower extremity pain, and weakness that he has been experiencing  recently   PHYSICAL EXAMINATION:  GENERAL:  He is an alert and oriented individual  in moderate to severe distress with back and leg pain.  MUSCULOSKELETAL:  The patient prefers standing as to sitting but does so  uncomfortable pacing about the room and maintaining his back forward  flexed approximately 15-20 degrees.  He notes that he has not been able  to lie down flat and has been sleeping while leaning over a table to  gain some level of comfort.  NEUROLOGIC:  Reveals that his motor strength in the iliopsoas and  quadriceps appears intact.  The tibialis anterior, extensor hallucis  longus on the left is weak to 3 minus out of 5, compared to the right  side.  Gastroc strength is also decreased by a grade.  His deep tendon  reflexes are 2+ in the patellae, absent in the left Achilles, 1+ in the  right Achilles.  It is also noted that he has 3+ pitting edema in the  distal lower extremities.  Sensation appears to be intact to vibration  in the distal lower extremities.  In the upper extremities, his motor  strength is good in the deltoids, biceps, triceps, grips and intrinsics.  Deep tendon reflexes are 2+ in the biceps and triceps, 1+ in the  brachial radialis.  Cranial nerve examination reveals pupils 3-mm   briskly reactive to light and accommodation.  Extraocular movements are  full.  Face symmetric grimace.  Tongue and uvula midline.  Sclerae and  conjunctivae are normal.  LUNGS:  Clear to auscultation.  HEART:  Regular rate and rhythm.  ABDOMEN:  Soft.  Bowel sounds positive.  No masses are palpable.  EXTREMITIES:  Reveal no cyanosis, clubbing or edema.   IMPRESSION:  The patient has evidence of a herniated nucleus pulposus at  the L4-5 and the L5-S1 level.  He is now being admitted to undergo  surgical extirpation of the disk.  As noted, the patient has 3+ pitting  edema in his lower extremities.  This will be observed during the  postoperative period.      Stefani Dama, M.D.  Electronically Signed     HJE/MEDQ  D:  09/25/2006  T:  09/25/2006  Job:  [id number]

## 2011-02-16 NOTE — Consult Note (Signed)
NAMEIRVING, BLOOR                   ACCOUNT NO.:  [id number]   MEDICAL RECORD NO.:  [id number]          PATIENT TYPE:  OIB   LOCATION:  3007                         FACILITY:  [facility]   PHYSICIAN:  Mark A. Perini, M.D.   DATE OF BIRTH:  04-Sep-1934   DATE OF CONSULTATION:  09/26/2006  DATE OF DISCHARGE:  09/26/2006                                 CONSULTATION   REFERRING PHYSICIAN:  Stefani Dama, M.D.   I was asked to see this patient by neurosurgery due to his lower  extremity edema.  [name] is a patient of our office, although he has not been in our office  in over 5 years.  He reports no interval medical problems.  He was  admitted for a herniated nucleus pulposus and radiculopathy and  underwent a lumbar diskectomy this admission.  He had been given  prednisone and has been unable to lie down for over 10 days due to his  back pain and leg pain and this is when his lower extremity swelling  began up.  He denies any chest pain or shortness of breath.  He reports  no known allergies and no previous medications.  He denies any previous  significant medical history.   PHYSICAL EXAM:  VITAL SIGNS:  His temperature was 98.0, he is afebrile.  Pulse was 58-41, respiratory rate 20, blood pressure 139/76, 95%  saturation on room air.  GENERAL:  He is in no acute distress.  Alert and oriented, sitting in  chair.  LUNGS:  Clear to auscultation bilaterally with no wheezes, rales or  rhonchi.  HEART:  Regular rhythm with no murmur, rub or gallop.  ABDOMEN:  Soft.  There is 2+ bilateral lower extremity edema which was  nonpitting.   LABORATORY DATA:  Sodium 140, potassium 4.7, chloride 103, CO2 29, BUN  19, creatinine 0.9, glucose 120, albumin 3.0.   EKG showed normal sinus rhythm with PACs and LVH by voltage, otherwise  normal.  Chest x-ray was negative for any acute findings.   ASSESSMENT/PLAN:  Probable dependent lower extremity edema.  Now that  the patient can rest in supine fashion.   I think that his edema will  gradually resolve.  We should make every effort to avoid prednisone and  NSAIDS if possible.  He will follow  up in my office in 7-10 days.  He  will call sooner if he has other problems.  I do not feel that any  further inpatient workup is necessary for his edema at this point.           ______________________________  Redge Gainer. Waynard Edwards, M.D.     MAP/MEDQ  D:  10/08/2006  T:  10/08/2006  Job:  [id number]

## 2011-02-16 NOTE — Discharge Summary (Signed)
[name], [name]                   ACCOUNT NO.:  [id number]   MEDICAL RECORD NO.:  [id number]          PATIENT TYPE:  OIB   LOCATION:  3007                         FACILITY:  [facility]   PHYSICIAN:  Stefani Dama, M.D.  DATE OF BIRTH:  03/04/1934   DATE OF ADMISSION:  09/25/2006  DATE OF DISCHARGE:  09/26/2006                               DISCHARGE SUMMARY   ADMISSION DIAGNOSIS:  Herniated nucleus pulposus L4-5 and L5-S1 with  left lumbar radiculopathy.   DISCHARGE AND FINAL DIAGNOSES:  1. Herniated nucleus pulposus at L4-5 and L5-S1 with left lumbar      radiculopathy.  2. Dependent edema both lower extremities.   CONDITION ON DISCHARGE:  Improving.   CONSULTATIONS:  Dr. Loraine Leriche A. Perini, M.D., Internal Medicine.   HOSPITAL COURSE:  [name]. [name] is a 77 year old individual who has had  severe unrelenting pain in the back and left lower extremity secondary  to herniated nucleus pulposus at L4-5 and L5-S1.  He was admitted to the  hospital to undergo surgical intervention which was performed on the  evening of the 26th.  It was noted at the time of admission that he had  3+ pitting edema in both ankles.  I had contacted Dr. [name], his  primary care physician, who evaluated him and feels that this is purely  secondary to dependent positioning and possibly aggravation from the  prednisone dosage that he had received in the weeks prior.   Postoperatively, the patient has experienced good relief of the leg pain  and has only mild to moderate back pain.  He is discharged home having  prescriptions at the house for Percocet and Valium.   He will be seen in the office in 2-3 weeks' time.   His incision on his back is dry.  A dressing is intact.  There is  minimal spotting, bleed through, on the dressing. We will see him in 2  weeks' time.   CONDITION ON DISCHARGE:  Improving.      Stefani Dama, M.D.  Electronically Signed     HJE/MEDQ  D:  09/26/2006  T:  09/26/2006   Job:  [id number]

## 2011-02-16 NOTE — Op Note (Signed)
[name], [name]                   ACCOUNT NO.:  [id number]   MEDICAL RECORD NO.:  [id number]          PATIENT TYPE:  OIB   LOCATION:  3007                         FACILITY:  [facility]   PHYSICIAN:  Stefani Dama, M.D.  DATE OF BIRTH:  [birth year]   DATE OF PROCEDURE:  09/25/2006  DATE OF DISCHARGE:                               OPERATIVE REPORT   PREOPERATIVE DIAGNOSIS:  Herniated nucleus pulposus L4-5, L5-S1 left  with left lumbar radiculopathy L5 distribution.   POSTOPERATIVE DIAGNOSIS:  Herniated nucleus pulposus L4-5, L5-S1 left  with left lumbar radiculopathy L5 distribution.   PROCEDURE:  Hemilaminectomy of L5 laminotomy L4-5 at L5-S1, diskectomy  L4-5, L5-S1, decompression of L5 nerve root.  Operating microscope  microdissection technique.   SURGEON:  Dr. [name]   FIRST ASSISTANT:  Dr. Delma Officer.   ANESTHESIA:  General endotracheal.   INDICATIONS:  [name] is a 77 year old individual who has had  significant back and left lower extremity pain with weakness in tibialis  anterior group. He was noted on a recent MRI to have substantial disk  herniation which is believed come from L5-S1 and moved cephalad.  There  is also presence of disk herniation at L4-5 which may have moved  possibly inferiorly, is larger than was 2 months ago.  He is now taken  to the operating room to undergo surgical decompression.   PROCEDURE:  The patient was brought to the operating room supine on the  stretcher after smooth induction of general endotracheal anesthesia was  turned prone.  The back was prepped with DuraPrep and draped in sterile  fashion.  A midline incision was created in the lower lumbar spinous and  was carried down to the lumbodorsal fascia. Fascia was opened on the  left side of midline and monopolar cautery was then used to dissect the  paraspinous fascia and allow a subperiosteal retraction of the  paraspinous musculature. The spinous process of L4 was marked  positively  and this was identified positively on a radiograph.  The L4-5 in the L5-  S1 space was then identified and the hemilamina of L5 was identified.  This was then resected using a high-speed bur and a 4 mm round bur. The  dura underlying this was identified. The yellow ligament was taken up in  this region to expose the interlaminar space at the L5-S1 space and  hemilaminectomy of L5 was completed. The common dural tube being exposed  the L5 nerve root was identified being pushed out laterally by  substantial mass underneath. This mass was identified as a number of  disk fragments which were traced down and found to be coming from the L5-  S1 space.  The fragments were removed in a piecemeal fashion and  gradually the common dural tube and the L5 nerve root became  decompressed.  Dissection was carried out laterally and a probe was  passed carefully under the L5 nerve root down to the lateral passage to  make sure any fragments that may been migrated there could be retrieved  and brought back into the opening.  Underneath the common dural tube on  the cephalad margin the L4-5 space was identified. The ligament over the  disk here was noted be rather firm and no fragments of disk were noted  to protrude themselves from underneath this ligament which was tightly  bound. A formal diskectomy at the L4-5 space was not then performed,  however, the space was explored and the space above the L4-5 disk space  was also explored. Path the L5 nerve root was cleared and decompressed.  Dissection was taken down to L5-S1 where an opening into the disk space  was identified.  This was enlarged with a 15 blade and a combination of  curettes and rongeurs was used to evacuate a substantial quantity of  severely degenerated disk material.  This was done both the medial and  lateral aspects removing numerous fragments of disk from within the disk  space until the space was essentially empty. Dissection  was carried as  far across the midline as could be possible in subligamentous space and  good decompression was achieved. The interval space was evacuated  copiously and irrigated copiously with antibiotic irrigating solution.  Hemostasis was then achieved in the epidural space.  There was numerous  epidural bleeding veins from around the L5 nerve root superiorly and the  L4-5 disk space and also the L5-S1 disk space. In the end hemostasis was  adequately achieved. Disk space was well decompressed. Both the paths of  the L5-S1 nerve roots were well decompressed and in the end the Gelfoam  soaked thrombin pledgets which were placed to obtain hemostasis were  removed in piecemeal fashion.  40 mg of Depo-Medrol was left in the  epidural space. Retractor was then removed. The microscope was removed.  Lumbodorsal fascia was then closed with #1 Vicryl interrupted fashion, 2-  0 Vicryl used in subcutaneous tissues, 3-0 Vicryl subcuticularly.  Blood  loss for the procedure was estimated about 250 mL. [name] helped  during the entirety of this procedure from the dissection of the  interlaminar space and the diskectomy by doing the lateral portions of  the diskectomy and also provided retraction against common dural tube  while under the microscope.      Stefani Dama, M.D.  Electronically Signed     HJE/MEDQ  D:  09/25/2006  T:  09/26/2006  Job:  [id number]

## 2011-02-19 ENCOUNTER — Inpatient Hospital Stay (HOSPITAL_COMMUNITY)
Admission: RE | Admit: 2011-02-19 | Discharge: 2011-02-22 | DRG: 470 | Disposition: A | Discharge status: Home Health | Payer: Medicare Other | Source: Ambulatory Visit | Attending: Orthopedic Surgery | Admitting: Orthopedic Surgery

## 2011-02-19 DIAGNOSIS — IMO0002 Reserved for concepts with insufficient information to code with codable children: Secondary | ICD-10-CM

## 2011-02-19 DIAGNOSIS — M1710 Unilateral primary osteoarthritis, unspecified knee: Principal | ICD-10-CM | POA: Diagnosis present

## 2011-02-19 DIAGNOSIS — N138 Other obstructive and reflux uropathy: Secondary | ICD-10-CM | POA: Diagnosis not present

## 2011-02-19 DIAGNOSIS — M21869 Other specified acquired deformities of unspecified lower leg: Secondary | ICD-10-CM | POA: Diagnosis present

## 2011-02-19 DIAGNOSIS — R339 Retention of urine, unspecified: Secondary | ICD-10-CM | POA: Diagnosis not present

## 2011-02-19 DIAGNOSIS — N401 Enlarged prostate with lower urinary tract symptoms: Secondary | ICD-10-CM | POA: Diagnosis not present

## 2011-02-19 DIAGNOSIS — Z01812 Encounter for preprocedural laboratory examination: Secondary | ICD-10-CM

## 2011-02-19 DIAGNOSIS — E785 Hyperlipidemia, unspecified: Secondary | ICD-10-CM | POA: Diagnosis present

## 2011-02-19 DIAGNOSIS — I428 Other cardiomyopathies: Secondary | ICD-10-CM | POA: Diagnosis present

## 2011-02-19 LAB — TYPE AND SCREEN: Antibody Screen: NEGATIVE

## 2011-02-20 LAB — BASIC METABOLIC PANEL
BUN: 12 mg/dL (ref 6–23)
CO2: 28 mEq/L (ref 19–32)
Calcium: 8 mg/dL — ABNORMAL LOW (ref 8.4–10.5)
Chloride: 103 mEq/L (ref 96–112)
Creatinine, Ser: 0.95 mg/dL (ref 0.4–1.5)
GFR calc Af Amer: >60 mL/min (ref >60)

## 2011-02-20 LAB — CBC
MCH: 30.4 pg (ref 26.0–34.0)
MCHC: 33.7 g/dL (ref 30.0–36.0)
MCV: 90.2 fL (ref 78.0–100.0)
Platelets: 196 10*3/uL (ref 150–400)

## 2011-02-21 LAB — CBC
MCH: 30.4 pg (ref 26.0–34.0)
MCV: 91.1 fL (ref 78.0–100.0)
Platelets: 189 10*3/uL (ref 150–400)
RBC: 4.25 MIL/uL (ref 4.22–5.81)
RDW: 14.5 % (ref 11.5–15.5)
WBC: 12.9 10*3/uL — ABNORMAL HIGH (ref 4.0–10.5)

## 2011-02-21 LAB — BASIC METABOLIC PANEL
BUN: 9 mg/dL (ref 6–23)
Chloride: 104 mEq/L (ref 96–112)
Creatinine, Ser: 1.03 mg/dL (ref 0.4–1.5)
GFR calc Af Amer: >60 mL/min (ref >60)
GFR calc non Af Amer: >60 mL/min (ref >60)

## 2011-02-21 NOTE — Op Note (Signed)
NAMEHALIM, SURRETTE                   ACCOUNT NO.:  [id number]  MEDICAL RECORD NO.:  [id number]           PATIENT TYPE:  I  LOCATION:  0012                         [facility]ACILITY[facility]  PHYSICIAN:  Ollen Gross, M.D.    DATE OF BIRTH:  11/07/1933  DATE OF PROCEDURE: DATE OF DISCHARGE:                              OPERATIVE REPORT   PREOPERATIVE DIAGNOSIS:  Osteoarthritis of the right knee.  POSTOPERATIVE DIAGNOSIS:  Osteoarthritis of the right knee.  PROCEDURE:  Right total-knee arthroplasty.  SURGEON:  Ollen Gross, M.D.  ASSISTANT:  Alexzandrew L. Perkins, P.A.C.  ANESTHESIA:  Spinal.  ESTIMATED BLOOD LOSS:  Minimal.  DRAINS:  Hemovac x1.  TOURNIQUET TIME:  41 minutes at 300 mmHg.  COMPLICATIONS:  None.  CONDITION:  Stable to recovery.  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  He has failed extensive nonoperative management and presents now for aright total-knee arthroplasty.  PROCEDURE IN DETAIL:  After the successful administration of spinal anesthetic, a tourniquet was placed on his right thigh and his right lower extremity was prepped and draped in the usual sterile fashion. Extremities wrapped in Esmarch, knee flexed, tourniquet inflated to 300 mmHg.  Midline incision was made with a 10-blade through subcutaneous tissue to the level of the extensor mechanism.  He has a severe valgus deformity, but the knee was also very stiff so I did not want to do a lateral arthrotomy as I did not think that I would be able to get the patella everted safely medially.  I subsequently did a medial parapatellar arthrotomy and did not elevate the soft tissue on the medial side.  I made a counter incision laterally from the superior part of the patella down adjacent to the patellar tendon to the tubercle.  I elevated the soft tissue on the proximal lateral tibia, around to  the posterolateral corner, but did not include the structures of the posterolateral corner.  We then everted the patella easily in the lateral direction.  The knee was flexed 90 degrees and the ACL was already gone, but the PCL was removed.  A drill was used to create a starting hole and the distal femoral canal was thoroughly irrigated.  A 5-degrees right valgus alignment guide was placed.  Distal femoral cutting block was pinned and the resection made for a distal resection of 12 mm due to his flexion contracture.  Distal femoral resection was made with an oscillating saw.  The tibia was subluxed forward and the menisci were removed. Extramedullary tibial alignment guide was placed referencing proximally at the medial aspect of the tibial tubercle and distally along the second metatarsal axis of the tibial crest.  Block was pinned to remove 2 mm off the more deficient lateral side.  Tibial resection was made with an oscillating saw.  Size 6 was the most appropriate tibial component and the proximal tibia was prepared with the modular drill and keel punch for the size 6.  Femoral sizing guide was placed and size 5 was the  most appropriate. Rotation was marked at the epicondylar axis and confirmed by creating rectangular flexion gap at 90 degrees.  The block was pinned in this rotation and the anterior, posterior and chamfer cuts were made. Intercondylar block was placed and that cut was made.  Trial size 5 posterior stabilized femur was placed.  The 12.5-mm insert was placed and there was a tiny bit of AP play so we went to a 15, which allowed for full extension with excellent varus-valgus and anterior-posterior balance throughout full range of motion.  The patella was everted and thickness measured to be 27 mm.  Freehand resection was taken to 15 mm, 41 template was placed, lug holes were drilled, a trial patella was placed and it tracks normally.  Large osteophytes were removed off  the posterior femur with the trials in place.  All trials were removed and the cut bone surface was prepared with pulsatile lavage.  Cement was mixed and once ready for implantation, the size 6 mobile bearing tibial tray, size 5 posterior stabilized femur and the 41 patella were cemented into place.  The patella was held with a clamp.  Trial 15 insert was placed, knee held in full extension, and all extruded cement was removed.  When the cement had fully hardened, then the permanent 15-mm posterior stabilized rotating platform insert was placed in the tibial tray.  The wound was copiously irrigated with saline solution and the arthrotomy medially closed with interrupted #1 PDS.  Flexion against gravity was 140 degrees and the patella tracks normally.  The tourniquet was released for a total time of 41 minutes.  The lateral arthrotomy was inspected and there was no evidence of any bleeding.  I left open a small area from the superior and inferior pole of the patella and then closed off the rest of it inferior to that with interrupted PDS. Flexion against gravity remains at 140 degrees with normal tracking of the patella.  Subcutaneous was then closed with interrupted 2-0 Vicryl, subcuticular running 4-0 Monocryl.  The catheter for the Marcaine pain pump was placed and the pump was initiated.  The incision was cleaned and dried and Steri-Strips and a bulky sterile dressing were applied. He was then placed into a knee immobilizer, awakened, and transported to recovery in stable condition.     Ollen Gross, M.D.     FA/MEDQ  D:  02/19/2011  T:  02/19/2011  Job:  [id number]  Electronically Signed by Ollen Gross M.D. on 02/21/2011 12:55:54 PM

## 2011-02-22 LAB — CBC
MCV: 90.7 fL (ref 78.0–100.0)
Platelets: 177 10*3/uL (ref 150–400)
RBC: 3.78 MIL/uL — ABNORMAL LOW (ref 4.22–5.81)
RDW: 14.5 % (ref 11.5–15.5)
WBC: 14.3 10*3/uL — ABNORMAL HIGH (ref 4.0–10.5)

## 2011-03-03 NOTE — H&P (Signed)
[name], [name]                   ACCOUNT NO.:  [id number]  MEDICAL RECORD NO.:  [id number]           PATIENT TYPE:  LOCATION:                                 FACILITY:  PHYSICIAN:  Ollen Gross, M.D.    DATE OF BIRTH:  1934-05-27  DATE OF ADMISSION: DATE OF DISCHARGE:                             HISTORY & PHYSICAL   DATE OF ADMISSION:  Feb 19, 2011  CHIEF COMPLAINT:  Right knee pain.  HISTORY OF PRESENT ILLNESS:  The patient is a 77 year old male who is seen by Dr. Lequita Halt, who has known advanced end-stage arthritis, it has been progressive over the years.  He has tried injections including Synvisc, which no longer help.  It is felt he would benefit undergoing surgical intervention.  Risks and benefits have been discussed and he elects to proceed with surgery.  He has been seen preoperatively by Dr. [name] and felt stable.  He does have chronic hydrocortisone therapy and feels that a stress dose steroid on the day of surgery is required.  He has also been seen by Dr. Shawnie Pons.  The patient is known to have known cardiac disease and does have some inflammatory risk with the procedure and has known disease without aggressive prevention due to the lack of medication tolerance.  It is felt that he can proceed with surgery.  Dr. Riley Kill also agrees with Dr. Waynard Edwards who states that he will need stress steroids.  The patient's risks and benefits have been discussed with the patient.  He would like to proceed with surgery.  ALLERGIES:  SULFA drugs cause anaphylactic shock and also TAMSULOSIN documented.  CURRENT MEDICATIONS:  Aspirin, cinnamon, CoQ10, vitamin D, fenofibrate, Proscar, glucosamine chondroitin, hydrocortisone, levothyroxine, multivitamin, omega-3, testosterone injections.  PAST MEDICAL HISTORY: 1. Hyperlipidemia. 2. Benign prostatic hypertrophy. 3. Past history of urinary retention following surgery. 4. Nonischemic cardiomyopathy, premature atrial  beats, coronary     arterial disease. 5. Chronic hydrocortisone steroid therapy. 6. Basal cell skin cancer.  PAST SURGICAL HISTORY: 1. Right knee arthroscopy, 2004. 2. Hammertoe surgery, 2004. 3. Right big toe surgery, 2005. 4. Back surgery, 2007. 5. Benign tumor removal, 2011.  FAMILY HISTORY:  Father deceased at age 40 with congestive heart failure.  Mother with history of herniated bowel.  He has 3 living siblings and 1 sibling that has passed away.  SOCIAL HISTORY:  Married.  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  Seldom intake of alcohol.  His wife will be assisting with care after surgery.  REVIEW OF SYSTEMS:  GENERAL:  No fevers, chills, night sweats. NEUROLOGIC:  No seizures, syncope, or paralysis.  RESPIRATORY:  No shortness breath, productive cough, or hemoptysis.  CARDIOVASCULAR:  No chest pain, orthopnea.  GI:  No nausea, vomiting, diarrhea, or constipation.  GU:  He does have a little bit of nocturia.  No dysuria, hematuria.  MUSCULOSKELETAL:  Knee pain.  PHYSICAL EXAMINATION:  VITAL SIGNS:  Pulse is around 52-54, respirations 12, blood pressure 148/68. GENERAL:  A 77 year old white male well nourished, well developed, tall frame, he is alert, oriented, and cooperative. HEENT:  Normocephalic, atraumatic.  Pupils are round and reactive.  EOMs intact.  Does have hearing aid. NECK:  Supple.  No carotid bruits are appreciated. CHEST:  Clear anterior and posterior chest walls.  No rhonchi, rales, or wheezing. HEART:  He does have an irregular-type rhythm, it is a bradycardic rhythm with some ectopic beats.  He does have a systolic ejection murmur grade 3/6. ABDOMEN:  Soft, nontender.  Bowel sounds present. RECTAL/BREASTS/GENITALIA:  Not done, not part of present illness. EXTREMITIES:  Right knee, range of motion is 5-125, marked crepitus.  He does have a severe valgus malalignment deformity.  No effusion is noted.  IMPRESSION:  Osteoarthritis right knee  with severe valgus malalignment deformity.  PLAN:  The patient admitted to [facility] to undergo a right total knee replacement arthroplasty.  Surgery will be performed by Dr. Ollen Gross.  Dr. Waynard Edwards and Dr. Riley Kill will be both notified of the patient's room number and admission and they will be consulted if needed for any medical assistance or cardiac assistance with this patient during the hospital course.     Alexzandrew L. Julien Girt, P.A.C.   ______________________________ Ollen Gross, M.D.    ALP/MEDQ  D:  02/18/2011  T:  02/18/2011  Job:  [id number]  cc:   Loraine Leriche A. Perini, M.D. Fax: [phone]  Arturo Morton. Riley Kill, MD, [facility] [address]  [address] [place] [address]  Ollen Gross, M.D. Fax: [phone]  Electronically Signed by Patrica Duel P.A.C. on 02/27/2011 07:10:10 AM Electronically Signed by Ollen Gross M.D. on 03/03/2011 04:05:42 PM

## 2011-03-21 ENCOUNTER — Other Ambulatory Visit: Payer: Medicare Other

## 2011-03-28 ENCOUNTER — Ambulatory Visit: Payer: BLUE CROSS/BLUE SHIELD | Admitting: Cardiology

## 2011-04-11 NOTE — Discharge Summary (Signed)
[name], Olsen                   ACCOUNT NO.:  [id number]  MEDICAL RECORD NO.:  [id number]  LOCATION:  1618                         FACILITY:  [facility]  PHYSICIAN:  [name], M.D.    DATE OF BIRTH:  1934/02/21  DATE OF ADMISSION:  02/19/2011 DATE OF DISCHARGE:  02/22/2011                              DISCHARGE SUMMARY   ADMITTING DIAGNOSES: 1. Osteoarthritis, right knee, with severe valgus malalignment     deformity. 2. Hyperlipidemia. 3. Benign prostatic hypertrophy. 4. Past history of urinary retention following surgery. 5. Nonischemic cardiomyopathy 6. Premature atrial beats. 7. Coronary arterial disease. 8. Chronic hydrocortisone steroid therapy. 9. Basal cell skin cancer.  DISCHARGE DIAGNOSES: 1. Osteoarthritis, right knee, status post right total knee     replacement arthroplasty. 2. Postoperative urinary retention. 3. Postoperative bradycardia. 4. Hyperlipidemia. 5. Benign prostatic hypertrophy. 6. Past history of urinary retention following surgery. 7. Nonischemic cardiomyopathy 8. Premature atrial beats. 9. Coronary arterial disease. 10.Chronic hydrocortisone steroid therapy. 11.Basal cell skin cancer.  PROCEDURE:  Feb 19, 2011, right total knee.  Surgeon Dr. Lequita Olsen. Assistant, [name], P.A.C.  Spinal anesthesia. Tourniquet time, 41 minutes.  CONSULTS:  None.  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  He has failed nonoperative management, now presents for total knee arthroplasty.  LABORATORY DATA:  Admission CBC is not scanned into the chart.  Followup CBC after surgery was 13.9.  Daily CBCs were followed, last known hemoglobin 11.5 and 34.3.  Chem panel on admission is not scanned under this chart.  Serial BMETs were followed for 48 hours.  Electrolytes all remained within normal limits.  Blood group type A negative.  HOSPITAL COURSE:  The patient  admitted to [facility], taken to OR, underwent above-stated procedure without complication.  The patient tolerated the procedure well, later transferred to the recovery room on orthopedic floor, started on p.o. and IV analgesic pain control following surgery, given 24 hours postop IV antibiotics.  Started on Xarelto for DVT prophylaxis.  The patient did pretty well on the morning of day 1, had a little bit of pain through the night, some intermittent nausea.  We DC'ed the PCA and switched him over to p.o. meds and provided a different IV push medication.  He did a little bit better with this.  He was started back on his home meds.  His pulse little slowed down in the high 40s and early 50s.  This was normal for him.  He states that he has always been bradycardic.  He was asymptomatic with this bradycardia.  Dr. Riley Olsen was his cardiologist, we would call if he were to develop any symptoms.  He started getting up with therapy, did fairly well with therapy by day 2 with removal of his Foley.  He was afebrile.  Hemoglobin was 12.9.  Incision was healing well.  No signs of infection.  Continued to work with physical therapy, arranging home health.  He did have a little bit problems with some urinary retention later that day requiring bladder scan.  He was able to void a little bit  later that day but was not able to empty his bladder requiring reinsertion of the Foley.  By day #3, he was doing well from an orthopedic standpoint.  We put him on some Flomax.  He had had this problem before and had to have the same treatment.  He converted over to a leg bag with a Foley.  He was meeting his goals from therapy standpoint, otherwise, other than the urinary retention he was doing well, was discharged home later that day.  DISCHARGE/PLAN: 1. The patient discharged home Feb 22, 2011. 2. Discharge diagnoses, please see above. 3. Discharge meds.  Current medications at time of transfer  include:  Robaxin, OxyIR, Xarelto.  Also given Flomax.  Continue home meds of enteric-coated aspirin 81 mg, finasteride, hydrocortisone and levothyroxine.  ACTIVITY:  Weightbearing as tolerated.  Total knee protocol.  FOLLOWUP:  He needs to follow up by Dr. Lequita Olsen in the office 2 weeks from date of surgery.  Please contact the office at [phone].  He will also follow up with [facility] Dr. Vonita Olsen if needed.  DIET:  Heart healthy diet.  DISPOSITION:  Home.  CONDITION ON DISCHARGE:  Improved.     [name], P.A.C.   ______________________________ [name], M.D.    ALP/MEDQ  D:  04/02/2011  T:  04/02/2011  Job:  [id number]  cc:   Loraine Leriche A. Olsen, M.D. Fax: [phone]  [name] Morton. [name] Kill, MD, [facility] [address]  [address] [place] [address]  Electronically Signed by [name] P.A.C. on 04/03/2011 07:30:34 AM Electronically Signed by [name] M.D. on 04/11/2011 12:32:13 PM

## 2011-06-06 ENCOUNTER — Ambulatory Visit
Admission: RE | Admit: 2011-06-06 | Discharge: 2011-06-06 | Disposition: A | Discharge status: Home / Self Care | Payer: Medicare Other | Source: Ambulatory Visit | Attending: Neurological Surgery | Admitting: Neurological Surgery

## 2011-06-06 DIAGNOSIS — D497 Neoplasm of unspecified behavior of endocrine glands and other parts of nervous system: Secondary | ICD-10-CM

## 2011-06-06 MED ORDER — GADOBENATE DIMEGLUMINE 529 MG/ML IV SOLN
10.0000 mL | Freq: Once | INTRAVENOUS | Status: AC | PRN
  Administered 2011-06-06: 10 mL via INTRAVENOUS

## 2011-08-30 DIAGNOSIS — E237 Disorder of pituitary gland, unspecified: Secondary | ICD-10-CM | POA: Insufficient documentation

## 2011-10-03 DIAGNOSIS — R972 Elevated prostate specific antigen [PSA]: Secondary | ICD-10-CM | POA: Diagnosis not present

## 2011-10-03 DIAGNOSIS — N138 Other obstructive and reflux uropathy: Secondary | ICD-10-CM | POA: Diagnosis not present

## 2011-10-03 DIAGNOSIS — N401 Enlarged prostate with lower urinary tract symptoms: Secondary | ICD-10-CM | POA: Diagnosis not present

## 2011-10-04 DIAGNOSIS — Z1212 Encounter for screening for malignant neoplasm of rectum: Secondary | ICD-10-CM | POA: Diagnosis not present

## 2011-10-08 DIAGNOSIS — Z0000 Encounter for general adult medical examination without abnormal findings: Secondary | ICD-10-CM | POA: Diagnosis not present

## 2011-10-08 DIAGNOSIS — E230 Hypopituitarism: Secondary | ICD-10-CM | POA: Diagnosis not present

## 2011-10-08 DIAGNOSIS — E039 Hypothyroidism, unspecified: Secondary | ICD-10-CM | POA: Diagnosis not present

## 2011-10-08 DIAGNOSIS — I2510 Atherosclerotic heart disease of native coronary artery without angina pectoris: Secondary | ICD-10-CM | POA: Diagnosis not present

## 2011-10-10 DIAGNOSIS — E291 Testicular hypofunction: Secondary | ICD-10-CM | POA: Diagnosis not present

## 2011-10-16 DIAGNOSIS — C751 Malignant neoplasm of pituitary gland: Secondary | ICD-10-CM | POA: Diagnosis not present

## 2011-10-16 DIAGNOSIS — D352 Benign neoplasm of pituitary gland: Secondary | ICD-10-CM | POA: Diagnosis not present

## 2011-10-24 DIAGNOSIS — E291 Testicular hypofunction: Secondary | ICD-10-CM | POA: Diagnosis not present

## 2011-11-07 DIAGNOSIS — E291 Testicular hypofunction: Secondary | ICD-10-CM | POA: Diagnosis not present

## 2011-11-21 DIAGNOSIS — E291 Testicular hypofunction: Secondary | ICD-10-CM | POA: Diagnosis not present

## 2011-11-21 DIAGNOSIS — Z79899 Other long term (current) drug therapy: Secondary | ICD-10-CM | POA: Diagnosis not present

## 2011-11-21 DIAGNOSIS — E559 Vitamin D deficiency, unspecified: Secondary | ICD-10-CM | POA: Diagnosis not present

## 2011-11-30 ENCOUNTER — Other Ambulatory Visit: Payer: Self-pay | Admitting: Oral and Maxillofacial Surgery

## 2011-11-30 DIAGNOSIS — L570 Actinic keratosis: Secondary | ICD-10-CM | POA: Diagnosis not present

## 2011-11-30 DIAGNOSIS — D219 Benign neoplasm of connective and other soft tissue, unspecified: Secondary | ICD-10-CM | POA: Diagnosis not present

## 2011-11-30 DIAGNOSIS — L723 Sebaceous cyst: Secondary | ICD-10-CM | POA: Diagnosis not present

## 2011-12-05 DIAGNOSIS — E291 Testicular hypofunction: Secondary | ICD-10-CM | POA: Diagnosis not present

## 2011-12-14 DIAGNOSIS — J0100 Acute maxillary sinusitis, unspecified: Secondary | ICD-10-CM | POA: Diagnosis not present

## 2011-12-18 DIAGNOSIS — E291 Testicular hypofunction: Secondary | ICD-10-CM | POA: Diagnosis not present

## 2011-12-19 DIAGNOSIS — J0100 Acute maxillary sinusitis, unspecified: Secondary | ICD-10-CM | POA: Diagnosis not present

## 2011-12-19 DIAGNOSIS — E291 Testicular hypofunction: Secondary | ICD-10-CM | POA: Diagnosis not present

## 2011-12-19 DIAGNOSIS — D352 Benign neoplasm of pituitary gland: Secondary | ICD-10-CM | POA: Diagnosis not present

## 2011-12-19 DIAGNOSIS — E2749 Other adrenocortical insufficiency: Secondary | ICD-10-CM | POA: Diagnosis not present

## 2011-12-19 DIAGNOSIS — E230 Hypopituitarism: Secondary | ICD-10-CM | POA: Diagnosis not present

## 2011-12-19 DIAGNOSIS — Z79899 Other long term (current) drug therapy: Secondary | ICD-10-CM | POA: Diagnosis not present

## 2011-12-19 DIAGNOSIS — D353 Benign neoplasm of craniopharyngeal duct: Secondary | ICD-10-CM | POA: Diagnosis not present

## 2012-01-02 DIAGNOSIS — N138 Other obstructive and reflux uropathy: Secondary | ICD-10-CM | POA: Diagnosis not present

## 2012-01-02 DIAGNOSIS — N400 Enlarged prostate without lower urinary tract symptoms: Secondary | ICD-10-CM | POA: Diagnosis not present

## 2012-01-02 DIAGNOSIS — N401 Enlarged prostate with lower urinary tract symptoms: Secondary | ICD-10-CM | POA: Diagnosis not present

## 2012-01-07 DIAGNOSIS — J309 Allergic rhinitis, unspecified: Secondary | ICD-10-CM | POA: Diagnosis not present

## 2012-01-09 DIAGNOSIS — N400 Enlarged prostate without lower urinary tract symptoms: Secondary | ICD-10-CM | POA: Diagnosis not present

## 2012-01-09 DIAGNOSIS — E291 Testicular hypofunction: Secondary | ICD-10-CM | POA: Diagnosis not present

## 2012-01-09 DIAGNOSIS — E2749 Other adrenocortical insufficiency: Secondary | ICD-10-CM | POA: Diagnosis not present

## 2012-01-09 DIAGNOSIS — E230 Hypopituitarism: Secondary | ICD-10-CM | POA: Diagnosis not present

## 2012-01-09 DIAGNOSIS — D352 Benign neoplasm of pituitary gland: Secondary | ICD-10-CM | POA: Diagnosis not present

## 2012-01-09 DIAGNOSIS — R972 Elevated prostate specific antigen [PSA]: Secondary | ICD-10-CM | POA: Diagnosis not present

## 2012-01-09 DIAGNOSIS — D353 Benign neoplasm of craniopharyngeal duct: Secondary | ICD-10-CM | POA: Diagnosis not present

## 2012-01-14 DIAGNOSIS — J3081 Allergic rhinitis due to animal (cat) (dog) hair and dander: Secondary | ICD-10-CM | POA: Diagnosis not present

## 2012-01-14 DIAGNOSIS — J301 Allergic rhinitis due to pollen: Secondary | ICD-10-CM | POA: Diagnosis not present

## 2012-01-14 DIAGNOSIS — J3089 Other allergic rhinitis: Secondary | ICD-10-CM | POA: Diagnosis not present

## 2012-01-15 DIAGNOSIS — J309 Allergic rhinitis, unspecified: Secondary | ICD-10-CM | POA: Diagnosis not present

## 2012-01-17 DIAGNOSIS — J309 Allergic rhinitis, unspecified: Secondary | ICD-10-CM | POA: Diagnosis not present

## 2012-01-21 DIAGNOSIS — J309 Allergic rhinitis, unspecified: Secondary | ICD-10-CM | POA: Diagnosis not present

## 2012-01-23 DIAGNOSIS — E291 Testicular hypofunction: Secondary | ICD-10-CM | POA: Diagnosis not present

## 2012-01-24 DIAGNOSIS — J309 Allergic rhinitis, unspecified: Secondary | ICD-10-CM | POA: Diagnosis not present

## 2012-01-28 DIAGNOSIS — J309 Allergic rhinitis, unspecified: Secondary | ICD-10-CM | POA: Diagnosis not present

## 2012-01-31 DIAGNOSIS — J309 Allergic rhinitis, unspecified: Secondary | ICD-10-CM | POA: Diagnosis not present

## 2012-02-04 DIAGNOSIS — J309 Allergic rhinitis, unspecified: Secondary | ICD-10-CM | POA: Diagnosis not present

## 2012-02-06 ENCOUNTER — Other Ambulatory Visit: Payer: Self-pay | Admitting: Dermatology

## 2012-02-06 DIAGNOSIS — C4360 Malignant melanoma of unspecified upper limb, including shoulder: Secondary | ICD-10-CM | POA: Diagnosis not present

## 2012-02-06 DIAGNOSIS — L820 Inflamed seborrheic keratosis: Secondary | ICD-10-CM | POA: Diagnosis not present

## 2012-02-06 DIAGNOSIS — C44519 Basal cell carcinoma of skin of other part of trunk: Secondary | ICD-10-CM | POA: Diagnosis not present

## 2012-02-06 DIAGNOSIS — C4359 Malignant melanoma of other part of trunk: Secondary | ICD-10-CM | POA: Diagnosis not present

## 2012-02-06 DIAGNOSIS — E291 Testicular hypofunction: Secondary | ICD-10-CM | POA: Diagnosis not present

## 2012-02-06 DIAGNOSIS — C44319 Basal cell carcinoma of skin of other parts of face: Secondary | ICD-10-CM | POA: Diagnosis not present

## 2012-02-06 DIAGNOSIS — L821 Other seborrheic keratosis: Secondary | ICD-10-CM | POA: Diagnosis not present

## 2012-02-06 DIAGNOSIS — L570 Actinic keratosis: Secondary | ICD-10-CM | POA: Diagnosis not present

## 2012-02-06 DIAGNOSIS — D485 Neoplasm of uncertain behavior of skin: Secondary | ICD-10-CM | POA: Diagnosis not present

## 2012-02-07 DIAGNOSIS — J309 Allergic rhinitis, unspecified: Secondary | ICD-10-CM | POA: Diagnosis not present

## 2012-02-11 DIAGNOSIS — J309 Allergic rhinitis, unspecified: Secondary | ICD-10-CM | POA: Diagnosis not present

## 2012-02-13 DIAGNOSIS — E039 Hypothyroidism, unspecified: Secondary | ICD-10-CM | POA: Diagnosis not present

## 2012-02-13 DIAGNOSIS — E785 Hyperlipidemia, unspecified: Secondary | ICD-10-CM | POA: Diagnosis not present

## 2012-02-13 DIAGNOSIS — F411 Generalized anxiety disorder: Secondary | ICD-10-CM | POA: Diagnosis not present

## 2012-02-13 DIAGNOSIS — E230 Hypopituitarism: Secondary | ICD-10-CM | POA: Diagnosis not present

## 2012-02-14 ENCOUNTER — Other Ambulatory Visit: Payer: Self-pay | Admitting: Dermatology

## 2012-02-14 DIAGNOSIS — J309 Allergic rhinitis, unspecified: Secondary | ICD-10-CM | POA: Diagnosis not present

## 2012-02-14 DIAGNOSIS — C4359 Malignant melanoma of other part of trunk: Secondary | ICD-10-CM | POA: Diagnosis not present

## 2012-02-14 DIAGNOSIS — C4360 Malignant melanoma of unspecified upper limb, including shoulder: Secondary | ICD-10-CM | POA: Diagnosis not present

## 2012-02-18 DIAGNOSIS — J309 Allergic rhinitis, unspecified: Secondary | ICD-10-CM | POA: Diagnosis not present

## 2012-02-20 DIAGNOSIS — E291 Testicular hypofunction: Secondary | ICD-10-CM | POA: Diagnosis not present

## 2012-02-21 DIAGNOSIS — J309 Allergic rhinitis, unspecified: Secondary | ICD-10-CM | POA: Diagnosis not present

## 2012-02-26 DIAGNOSIS — J309 Allergic rhinitis, unspecified: Secondary | ICD-10-CM | POA: Diagnosis not present

## 2012-02-28 DIAGNOSIS — C44319 Basal cell carcinoma of skin of other parts of face: Secondary | ICD-10-CM | POA: Diagnosis not present

## 2012-02-29 DIAGNOSIS — J309 Allergic rhinitis, unspecified: Secondary | ICD-10-CM | POA: Diagnosis not present

## 2012-03-03 DIAGNOSIS — J309 Allergic rhinitis, unspecified: Secondary | ICD-10-CM | POA: Diagnosis not present

## 2012-03-05 DIAGNOSIS — E291 Testicular hypofunction: Secondary | ICD-10-CM | POA: Diagnosis not present

## 2012-03-06 ENCOUNTER — Other Ambulatory Visit: Payer: Self-pay | Admitting: Dermatology

## 2012-03-06 DIAGNOSIS — D485 Neoplasm of uncertain behavior of skin: Secondary | ICD-10-CM | POA: Diagnosis not present

## 2012-03-06 DIAGNOSIS — J309 Allergic rhinitis, unspecified: Secondary | ICD-10-CM | POA: Diagnosis not present

## 2012-03-06 DIAGNOSIS — L570 Actinic keratosis: Secondary | ICD-10-CM | POA: Diagnosis not present

## 2012-03-11 DIAGNOSIS — J309 Allergic rhinitis, unspecified: Secondary | ICD-10-CM | POA: Diagnosis not present

## 2012-03-18 DIAGNOSIS — E291 Testicular hypofunction: Secondary | ICD-10-CM | POA: Diagnosis not present

## 2012-03-18 DIAGNOSIS — J309 Allergic rhinitis, unspecified: Secondary | ICD-10-CM | POA: Diagnosis not present

## 2012-03-24 DIAGNOSIS — J309 Allergic rhinitis, unspecified: Secondary | ICD-10-CM | POA: Diagnosis not present

## 2012-04-01 DIAGNOSIS — J309 Allergic rhinitis, unspecified: Secondary | ICD-10-CM | POA: Diagnosis not present

## 2012-04-02 DIAGNOSIS — N529 Male erectile dysfunction, unspecified: Secondary | ICD-10-CM | POA: Diagnosis not present

## 2012-04-02 DIAGNOSIS — E291 Testicular hypofunction: Secondary | ICD-10-CM | POA: Diagnosis not present

## 2012-04-04 DIAGNOSIS — J309 Allergic rhinitis, unspecified: Secondary | ICD-10-CM | POA: Diagnosis not present

## 2012-04-04 DIAGNOSIS — E291 Testicular hypofunction: Secondary | ICD-10-CM | POA: Diagnosis not present

## 2012-04-07 DIAGNOSIS — D353 Benign neoplasm of craniopharyngeal duct: Secondary | ICD-10-CM | POA: Diagnosis not present

## 2012-04-07 DIAGNOSIS — D352 Benign neoplasm of pituitary gland: Secondary | ICD-10-CM | POA: Diagnosis not present

## 2012-04-07 DIAGNOSIS — Z09 Encounter for follow-up examination after completed treatment for conditions other than malignant neoplasm: Secondary | ICD-10-CM | POA: Diagnosis not present

## 2012-04-08 DIAGNOSIS — J309 Allergic rhinitis, unspecified: Secondary | ICD-10-CM | POA: Diagnosis not present

## 2012-04-11 DIAGNOSIS — J309 Allergic rhinitis, unspecified: Secondary | ICD-10-CM | POA: Diagnosis not present

## 2012-04-14 DIAGNOSIS — J309 Allergic rhinitis, unspecified: Secondary | ICD-10-CM | POA: Diagnosis not present

## 2012-04-14 DIAGNOSIS — D352 Benign neoplasm of pituitary gland: Secondary | ICD-10-CM | POA: Diagnosis not present

## 2012-04-16 ENCOUNTER — Other Ambulatory Visit: Payer: Self-pay | Admitting: Dermatology

## 2012-04-16 DIAGNOSIS — E291 Testicular hypofunction: Secondary | ICD-10-CM | POA: Diagnosis not present

## 2012-04-16 DIAGNOSIS — L723 Sebaceous cyst: Secondary | ICD-10-CM | POA: Diagnosis not present

## 2012-04-16 DIAGNOSIS — L570 Actinic keratosis: Secondary | ICD-10-CM | POA: Diagnosis not present

## 2012-04-21 ENCOUNTER — Ambulatory Visit (INDEPENDENT_AMBULATORY_CARE_PROVIDER_SITE_OTHER): Payer: Medicare Other | Admitting: Cardiology

## 2012-04-21 ENCOUNTER — Encounter: Payer: Self-pay | Admitting: Cardiology

## 2012-04-21 VITALS — BP 150/80 | HR 71 | Ht 75.0 in | Wt 255.0 lb

## 2012-04-21 DIAGNOSIS — I498 Other specified cardiac arrhythmias: Secondary | ICD-10-CM | POA: Diagnosis not present

## 2012-04-21 DIAGNOSIS — I428 Other cardiomyopathies: Secondary | ICD-10-CM

## 2012-04-21 DIAGNOSIS — E7800 Pure hypercholesterolemia, unspecified: Secondary | ICD-10-CM

## 2012-04-21 DIAGNOSIS — I2510 Atherosclerotic heart disease of native coronary artery without angina pectoris: Secondary | ICD-10-CM

## 2012-04-21 DIAGNOSIS — J309 Allergic rhinitis, unspecified: Secondary | ICD-10-CM | POA: Diagnosis not present

## 2012-04-21 NOTE — Patient Instructions (Addendum)
Your physician has requested that you have an echocardiogram. Echocardiography is a painless test that uses sound waves to create images of your heart. It provides your doctor with information about the size and shape of your heart and how well your heart's chambers and valves are working. This procedure takes approximately one hour. There are no restrictions for this procedure.   Your physician recommends that you continue on your current medications as directed. Please refer to the Current Medication list given to you today.   Your physician wants you to follow-up in: 6 MONTHS  You will receive a reminder letter in the mail two months in advance. If you don't receive a letter, please call our office to schedule the follow-up appointment.  

## 2012-04-25 DIAGNOSIS — J309 Allergic rhinitis, unspecified: Secondary | ICD-10-CM | POA: Diagnosis not present

## 2012-04-27 NOTE — Assessment & Plan Note (Signed)
Much of the bradycardia appears to be functional. He has atrial bigeminy. The current EKG demonstrates as well. In the absence of symptoms, therapies such as ablation would not be considered.

## 2012-04-27 NOTE — Assessment & Plan Note (Signed)
The patient is intolerant to statins. His been managed by Dr. Waynard Edwards appropriately

## 2012-04-27 NOTE — Assessment & Plan Note (Signed)
Has not had current symptoms. Overall he has done well. He remains a conservative course of management.

## 2012-04-27 NOTE — Assessment & Plan Note (Signed)
The patient has had no hemodynamic symptoms. He's tolerated things relatively well. It would be an appropriate time to do a repeat 2-D echocardiogram as a significant reduction in left ventricular ejection fraction, and this needs to be monitored on a serial basis.

## 2012-04-27 NOTE — Progress Notes (Signed)
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 From a cardiac standpoint he is doing extremely well. The patient has had resection of a pituitary microadenoma by Dr. [name], and then subsequently had a gamma knife at [facility].  He then had a knee replacement. They have noticed his pulse to be slow intermittently, and this has historically been associated with atrial bigeminy. He denies any syncope or presyncope, and overall seems beginning along extremely well. The patient's had prior cardiac catheterization by Dr. Donnie Aho, and has been treated medically. It is important to note that he has not been a candidate for statins, and has a poor tolerance for medications.  Current Outpatient Prescriptions  Medication Sig Dispense Refill  . aspirin 81 MG tablet Take 81 mg by mouth daily.        . Cinnamon 500 MG capsule Take 500 mg by mouth daily.        . Coenzyme Q10 (COQ10) 200 MG CAPS Take 1 capsule by mouth daily.        . ergocalciferol (VITAMIN D2) 50000 UNITS capsule Take 50,000 Units by mouth once a week.        . fenofibrate 160 MG tablet Take 1 tablet by mouth Daily.      . finasteride (PROSCAR) 5 MG tablet Take 5 mg by mouth daily.        . Glucosamine Sulfate 1000 MG CAPS Take 1 capsule by mouth daily.        . hydrocortisone (CORTEF) 10 MG tablet One and on half tablet daily      . levothyroxine (SYNTHROID, LEVOTHROID) 75 MCG tablet As directed. One tablet every other day. One and one half the other days.      . Multiple Vitamins-Minerals (MEGA MULTI MEN PO) Take 1 tablet by mouth daily.        Marland Kitchen omega-3 acid ethyl esters (LOVAZA) 1 G capsule Take by mouth. Take 4 caps daily       . testosterone cypionate (DEPOTESTOTERONE CYPIONATE) 200 MG/ML injection Inject into the muscle every 14 (fourteen) days. 1.62 % injection         Allergies  Allergen Reactions  . Sulfonamide Derivatives   . Tamsulosin     Past Medical History  Diagnosis Date  . Hyperlipidemia   . BPH (benign prostatic hypertrophy)   .  Non-ischemic cardiomyopathy   . Atrial premature beats   . Coronary artery disease     Past Surgical History  Procedure Date  . Hand surgery     left  . Cataract extraction   . Back surgery   . Hammer  bunion toe surgery   . Pituitary surgery     12/01/09 Dr. Annalee Genta and Dr. [name].  . Arthroscopic surgery     Family History  Problem Relation Age of Onset  . Heart failure Father 72    deceased  . Other Mother 49    deceased old age  . Coronary artery disease Brother   . Other Sister 77    polio    History   Social History  . Marital Status: Married    Spouse Name: N/A    Number of Children: N/A  . Years of Education: N/A   Occupational History  . retired Associate Professor    Social History Main Topics  . Smoking status: Never Smoker   . Smokeless tobacco: Not on file  . Alcohol Use: No  . Drug Use: No  . Sexually Active: Not on file   Other  Topics Concern  . Not on file   Social History Narrative  . No narrative on file    ROS: Please see the HPI.  All other systems reviewed and negative.  PHYSICAL EXAM:  BP 150/80  Pulse 71  Ht 6\' 3"  (1.905 m)  Wt 255 lb (115.667 kg)  BMI 31.87 kg/m2  General: Well developed, well nourished, in no acute distress. Head:  Normocephalic and atraumatic. Neck: no JVD Lungs: Clear to auscultation and percussion. Heart: Normal S1 and S2.  No murmur, rubs or gallops.  Abdomen:  Normal bowel sounds; soft; non tender; no organomegaly Pulses: Pulses normal in all 4 extremities. Extremities: No clubbing or cyanosis. No edema. Neurologic: Alert and oriented x 3.  EKG:  NSR.  APCs and VPCs.  Otherwise normal tracing.  No real change from prior tracing of 14 months earlier.   ASSESSMENT AND PLAN:

## 2012-04-28 DIAGNOSIS — J309 Allergic rhinitis, unspecified: Secondary | ICD-10-CM | POA: Diagnosis not present

## 2012-04-30 DIAGNOSIS — E291 Testicular hypofunction: Secondary | ICD-10-CM | POA: Diagnosis not present

## 2012-05-02 DIAGNOSIS — J309 Allergic rhinitis, unspecified: Secondary | ICD-10-CM | POA: Diagnosis not present

## 2012-05-05 DIAGNOSIS — J309 Allergic rhinitis, unspecified: Secondary | ICD-10-CM | POA: Diagnosis not present

## 2012-05-12 DIAGNOSIS — J309 Allergic rhinitis, unspecified: Secondary | ICD-10-CM | POA: Diagnosis not present

## 2012-05-14 ENCOUNTER — Ambulatory Visit (HOSPITAL_COMMUNITY): Payer: Medicare Other | Attending: Cardiovascular Disease

## 2012-05-14 DIAGNOSIS — E785 Hyperlipidemia, unspecified: Secondary | ICD-10-CM | POA: Diagnosis not present

## 2012-05-14 DIAGNOSIS — E291 Testicular hypofunction: Secondary | ICD-10-CM | POA: Diagnosis not present

## 2012-05-14 DIAGNOSIS — I498 Other specified cardiac arrhythmias: Secondary | ICD-10-CM | POA: Diagnosis not present

## 2012-05-14 DIAGNOSIS — I2510 Atherosclerotic heart disease of native coronary artery without angina pectoris: Secondary | ICD-10-CM | POA: Diagnosis not present

## 2012-05-14 DIAGNOSIS — I428 Other cardiomyopathies: Secondary | ICD-10-CM | POA: Insufficient documentation

## 2012-05-14 DIAGNOSIS — I079 Rheumatic tricuspid valve disease, unspecified: Secondary | ICD-10-CM | POA: Diagnosis not present

## 2012-05-14 DIAGNOSIS — I059 Rheumatic mitral valve disease, unspecified: Secondary | ICD-10-CM | POA: Diagnosis not present

## 2012-05-14 NOTE — Progress Notes (Signed)
Echocardiogram performed.  

## 2012-05-19 DIAGNOSIS — J309 Allergic rhinitis, unspecified: Secondary | ICD-10-CM | POA: Diagnosis not present

## 2012-05-20 DIAGNOSIS — J309 Allergic rhinitis, unspecified: Secondary | ICD-10-CM | POA: Diagnosis not present

## 2012-05-26 DIAGNOSIS — J309 Allergic rhinitis, unspecified: Secondary | ICD-10-CM | POA: Diagnosis not present

## 2012-05-28 DIAGNOSIS — E291 Testicular hypofunction: Secondary | ICD-10-CM | POA: Diagnosis not present

## 2012-06-03 DIAGNOSIS — J309 Allergic rhinitis, unspecified: Secondary | ICD-10-CM | POA: Diagnosis not present

## 2012-06-04 ENCOUNTER — Other Ambulatory Visit: Payer: Self-pay | Admitting: Dermatology

## 2012-06-04 DIAGNOSIS — L570 Actinic keratosis: Secondary | ICD-10-CM | POA: Diagnosis not present

## 2012-06-04 DIAGNOSIS — L821 Other seborrheic keratosis: Secondary | ICD-10-CM | POA: Diagnosis not present

## 2012-06-04 DIAGNOSIS — C44519 Basal cell carcinoma of skin of other part of trunk: Secondary | ICD-10-CM | POA: Diagnosis not present

## 2012-06-04 DIAGNOSIS — Z85820 Personal history of malignant melanoma of skin: Secondary | ICD-10-CM | POA: Diagnosis not present

## 2012-06-04 DIAGNOSIS — L851 Acquired keratosis [keratoderma] palmaris et plantaris: Secondary | ICD-10-CM | POA: Diagnosis not present

## 2012-06-04 DIAGNOSIS — Z85828 Personal history of other malignant neoplasm of skin: Secondary | ICD-10-CM | POA: Diagnosis not present

## 2012-06-04 DIAGNOSIS — C44611 Basal cell carcinoma of skin of unspecified upper limb, including shoulder: Secondary | ICD-10-CM | POA: Diagnosis not present

## 2012-06-04 DIAGNOSIS — D485 Neoplasm of uncertain behavior of skin: Secondary | ICD-10-CM | POA: Diagnosis not present

## 2012-06-10 DIAGNOSIS — J309 Allergic rhinitis, unspecified: Secondary | ICD-10-CM | POA: Diagnosis not present

## 2012-06-11 DIAGNOSIS — I2510 Atherosclerotic heart disease of native coronary artery without angina pectoris: Secondary | ICD-10-CM | POA: Diagnosis not present

## 2012-06-11 DIAGNOSIS — E291 Testicular hypofunction: Secondary | ICD-10-CM | POA: Diagnosis not present

## 2012-06-11 DIAGNOSIS — E230 Hypopituitarism: Secondary | ICD-10-CM | POA: Diagnosis not present

## 2012-06-11 DIAGNOSIS — E785 Hyperlipidemia, unspecified: Secondary | ICD-10-CM | POA: Diagnosis not present

## 2012-06-11 DIAGNOSIS — Z23 Encounter for immunization: Secondary | ICD-10-CM | POA: Diagnosis not present

## 2012-06-16 DIAGNOSIS — J309 Allergic rhinitis, unspecified: Secondary | ICD-10-CM | POA: Diagnosis not present

## 2012-06-23 DIAGNOSIS — J309 Allergic rhinitis, unspecified: Secondary | ICD-10-CM | POA: Diagnosis not present

## 2012-06-25 DIAGNOSIS — J309 Allergic rhinitis, unspecified: Secondary | ICD-10-CM | POA: Diagnosis not present

## 2012-06-27 DIAGNOSIS — J309 Allergic rhinitis, unspecified: Secondary | ICD-10-CM | POA: Diagnosis not present

## 2012-06-30 DIAGNOSIS — J309 Allergic rhinitis, unspecified: Secondary | ICD-10-CM | POA: Diagnosis not present

## 2012-07-01 DIAGNOSIS — E291 Testicular hypofunction: Secondary | ICD-10-CM | POA: Diagnosis not present

## 2012-07-07 DIAGNOSIS — J309 Allergic rhinitis, unspecified: Secondary | ICD-10-CM | POA: Diagnosis not present

## 2012-07-11 DIAGNOSIS — J309 Allergic rhinitis, unspecified: Secondary | ICD-10-CM | POA: Diagnosis not present

## 2012-07-17 DIAGNOSIS — J309 Allergic rhinitis, unspecified: Secondary | ICD-10-CM | POA: Diagnosis not present

## 2012-07-17 DIAGNOSIS — J301 Allergic rhinitis due to pollen: Secondary | ICD-10-CM | POA: Diagnosis not present

## 2012-07-17 DIAGNOSIS — J3081 Allergic rhinitis due to animal (cat) (dog) hair and dander: Secondary | ICD-10-CM | POA: Diagnosis not present

## 2012-07-17 DIAGNOSIS — J3089 Other allergic rhinitis: Secondary | ICD-10-CM | POA: Diagnosis not present

## 2012-07-21 DIAGNOSIS — J309 Allergic rhinitis, unspecified: Secondary | ICD-10-CM | POA: Diagnosis not present

## 2012-07-23 DIAGNOSIS — E291 Testicular hypofunction: Secondary | ICD-10-CM | POA: Diagnosis not present

## 2012-07-28 DIAGNOSIS — J309 Allergic rhinitis, unspecified: Secondary | ICD-10-CM | POA: Diagnosis not present

## 2012-08-04 DIAGNOSIS — J309 Allergic rhinitis, unspecified: Secondary | ICD-10-CM | POA: Diagnosis not present

## 2012-08-13 DIAGNOSIS — J309 Allergic rhinitis, unspecified: Secondary | ICD-10-CM | POA: Diagnosis not present

## 2012-08-13 DIAGNOSIS — E291 Testicular hypofunction: Secondary | ICD-10-CM | POA: Diagnosis not present

## 2012-08-18 DIAGNOSIS — J309 Allergic rhinitis, unspecified: Secondary | ICD-10-CM | POA: Diagnosis not present

## 2012-08-18 DIAGNOSIS — R972 Elevated prostate specific antigen [PSA]: Secondary | ICD-10-CM | POA: Diagnosis not present

## 2012-08-18 DIAGNOSIS — N400 Enlarged prostate without lower urinary tract symptoms: Secondary | ICD-10-CM | POA: Diagnosis not present

## 2012-08-25 DIAGNOSIS — J309 Allergic rhinitis, unspecified: Secondary | ICD-10-CM | POA: Diagnosis not present

## 2012-09-01 DIAGNOSIS — J309 Allergic rhinitis, unspecified: Secondary | ICD-10-CM | POA: Diagnosis not present

## 2012-09-03 DIAGNOSIS — E291 Testicular hypofunction: Secondary | ICD-10-CM | POA: Diagnosis not present

## 2012-09-09 DIAGNOSIS — J309 Allergic rhinitis, unspecified: Secondary | ICD-10-CM | POA: Diagnosis not present

## 2012-09-15 DIAGNOSIS — J309 Allergic rhinitis, unspecified: Secondary | ICD-10-CM | POA: Diagnosis not present

## 2012-09-22 DIAGNOSIS — J309 Allergic rhinitis, unspecified: Secondary | ICD-10-CM | POA: Diagnosis not present

## 2012-09-23 DIAGNOSIS — E291 Testicular hypofunction: Secondary | ICD-10-CM | POA: Diagnosis not present

## 2012-09-29 DIAGNOSIS — J309 Allergic rhinitis, unspecified: Secondary | ICD-10-CM | POA: Diagnosis not present

## 2012-10-08 DIAGNOSIS — J309 Allergic rhinitis, unspecified: Secondary | ICD-10-CM | POA: Diagnosis not present

## 2012-10-10 DIAGNOSIS — J309 Allergic rhinitis, unspecified: Secondary | ICD-10-CM | POA: Diagnosis not present

## 2012-10-13 DIAGNOSIS — J309 Allergic rhinitis, unspecified: Secondary | ICD-10-CM | POA: Diagnosis not present

## 2012-10-13 DIAGNOSIS — E039 Hypothyroidism, unspecified: Secondary | ICD-10-CM | POA: Diagnosis not present

## 2012-10-20 DIAGNOSIS — J309 Allergic rhinitis, unspecified: Secondary | ICD-10-CM | POA: Diagnosis not present

## 2012-10-22 DIAGNOSIS — N529 Male erectile dysfunction, unspecified: Secondary | ICD-10-CM | POA: Diagnosis not present

## 2012-10-27 DIAGNOSIS — J309 Allergic rhinitis, unspecified: Secondary | ICD-10-CM | POA: Diagnosis not present

## 2012-10-29 DIAGNOSIS — I2510 Atherosclerotic heart disease of native coronary artery without angina pectoris: Secondary | ICD-10-CM | POA: Diagnosis not present

## 2012-10-29 DIAGNOSIS — E559 Vitamin D deficiency, unspecified: Secondary | ICD-10-CM | POA: Diagnosis not present

## 2012-10-29 DIAGNOSIS — Z125 Encounter for screening for malignant neoplasm of prostate: Secondary | ICD-10-CM | POA: Diagnosis not present

## 2012-10-29 DIAGNOSIS — E039 Hypothyroidism, unspecified: Secondary | ICD-10-CM | POA: Diagnosis not present

## 2012-10-29 DIAGNOSIS — N529 Male erectile dysfunction, unspecified: Secondary | ICD-10-CM | POA: Diagnosis not present

## 2012-10-29 DIAGNOSIS — E785 Hyperlipidemia, unspecified: Secondary | ICD-10-CM | POA: Diagnosis not present

## 2012-11-03 DIAGNOSIS — J309 Allergic rhinitis, unspecified: Secondary | ICD-10-CM | POA: Diagnosis not present

## 2012-11-05 DIAGNOSIS — E669 Obesity, unspecified: Secondary | ICD-10-CM | POA: Diagnosis not present

## 2012-11-05 DIAGNOSIS — E291 Testicular hypofunction: Secondary | ICD-10-CM | POA: Diagnosis not present

## 2012-11-05 DIAGNOSIS — Z0000 Encounter for general adult medical examination without abnormal findings: Secondary | ICD-10-CM | POA: Diagnosis not present

## 2012-11-05 DIAGNOSIS — R7301 Impaired fasting glucose: Secondary | ICD-10-CM | POA: Diagnosis not present

## 2012-11-07 DIAGNOSIS — Z1212 Encounter for screening for malignant neoplasm of rectum: Secondary | ICD-10-CM | POA: Diagnosis not present

## 2012-11-10 DIAGNOSIS — J309 Allergic rhinitis, unspecified: Secondary | ICD-10-CM | POA: Diagnosis not present

## 2012-11-18 DIAGNOSIS — J309 Allergic rhinitis, unspecified: Secondary | ICD-10-CM | POA: Diagnosis not present

## 2012-11-27 DIAGNOSIS — J309 Allergic rhinitis, unspecified: Secondary | ICD-10-CM | POA: Diagnosis not present

## 2012-12-01 DIAGNOSIS — J309 Allergic rhinitis, unspecified: Secondary | ICD-10-CM | POA: Diagnosis not present

## 2012-12-03 ENCOUNTER — Other Ambulatory Visit: Payer: Self-pay | Admitting: Dermatology

## 2012-12-03 DIAGNOSIS — E291 Testicular hypofunction: Secondary | ICD-10-CM | POA: Diagnosis not present

## 2012-12-03 DIAGNOSIS — C4401 Basal cell carcinoma of skin of lip: Secondary | ICD-10-CM | POA: Diagnosis not present

## 2012-12-03 DIAGNOSIS — D216 Benign neoplasm of connective and other soft tissue of trunk, unspecified: Secondary | ICD-10-CM | POA: Diagnosis not present

## 2012-12-03 DIAGNOSIS — L570 Actinic keratosis: Secondary | ICD-10-CM | POA: Diagnosis not present

## 2012-12-03 DIAGNOSIS — Z85828 Personal history of other malignant neoplasm of skin: Secondary | ICD-10-CM | POA: Diagnosis not present

## 2012-12-03 DIAGNOSIS — L821 Other seborrheic keratosis: Secondary | ICD-10-CM | POA: Diagnosis not present

## 2012-12-03 DIAGNOSIS — L723 Sebaceous cyst: Secondary | ICD-10-CM | POA: Diagnosis not present

## 2012-12-03 DIAGNOSIS — Z85820 Personal history of malignant melanoma of skin: Secondary | ICD-10-CM | POA: Diagnosis not present

## 2012-12-03 DIAGNOSIS — D235 Other benign neoplasm of skin of trunk: Secondary | ICD-10-CM | POA: Diagnosis not present

## 2012-12-03 DIAGNOSIS — D485 Neoplasm of uncertain behavior of skin: Secondary | ICD-10-CM | POA: Diagnosis not present

## 2012-12-03 DIAGNOSIS — C44319 Basal cell carcinoma of skin of other parts of face: Secondary | ICD-10-CM | POA: Diagnosis not present

## 2012-12-04 DIAGNOSIS — J309 Allergic rhinitis, unspecified: Secondary | ICD-10-CM | POA: Diagnosis not present

## 2012-12-08 DIAGNOSIS — J309 Allergic rhinitis, unspecified: Secondary | ICD-10-CM | POA: Diagnosis not present

## 2012-12-17 DIAGNOSIS — J309 Allergic rhinitis, unspecified: Secondary | ICD-10-CM | POA: Diagnosis not present

## 2012-12-23 DIAGNOSIS — E291 Testicular hypofunction: Secondary | ICD-10-CM | POA: Diagnosis not present

## 2012-12-25 DIAGNOSIS — J309 Allergic rhinitis, unspecified: Secondary | ICD-10-CM | POA: Diagnosis not present

## 2012-12-30 DIAGNOSIS — J309 Allergic rhinitis, unspecified: Secondary | ICD-10-CM | POA: Diagnosis not present

## 2012-12-31 DIAGNOSIS — C4401 Basal cell carcinoma of skin of lip: Secondary | ICD-10-CM | POA: Diagnosis not present

## 2013-01-01 DIAGNOSIS — J301 Allergic rhinitis due to pollen: Secondary | ICD-10-CM | POA: Diagnosis not present

## 2013-01-01 DIAGNOSIS — J3081 Allergic rhinitis due to animal (cat) (dog) hair and dander: Secondary | ICD-10-CM | POA: Diagnosis not present

## 2013-01-01 DIAGNOSIS — J3089 Other allergic rhinitis: Secondary | ICD-10-CM | POA: Diagnosis not present

## 2013-01-05 DIAGNOSIS — J309 Allergic rhinitis, unspecified: Secondary | ICD-10-CM | POA: Diagnosis not present

## 2013-01-12 DIAGNOSIS — J309 Allergic rhinitis, unspecified: Secondary | ICD-10-CM | POA: Diagnosis not present

## 2013-01-15 ENCOUNTER — Ambulatory Visit (INDEPENDENT_AMBULATORY_CARE_PROVIDER_SITE_OTHER): Payer: Medicare Other | Admitting: Cardiology

## 2013-01-15 ENCOUNTER — Encounter: Payer: Self-pay | Admitting: Cardiology

## 2013-01-15 VITALS — BP 124/70 | HR 67 | Ht 75.0 in | Wt 247.0 lb

## 2013-01-15 DIAGNOSIS — I2510 Atherosclerotic heart disease of native coronary artery without angina pectoris: Secondary | ICD-10-CM | POA: Diagnosis not present

## 2013-01-15 DIAGNOSIS — E291 Testicular hypofunction: Secondary | ICD-10-CM | POA: Diagnosis not present

## 2013-01-15 DIAGNOSIS — E7800 Pure hypercholesterolemia, unspecified: Secondary | ICD-10-CM

## 2013-01-15 DIAGNOSIS — I428 Other cardiomyopathies: Secondary | ICD-10-CM

## 2013-01-15 NOTE — Patient Instructions (Addendum)
Your physician wants you to follow-up in: 6 MONTHS with Dr Shirlee Latch (previous pt of Dr Riley Kill). You will receive a reminder letter in the mail two months in advance. If you don't receive a letter, please call our office to schedule the follow-up appointment.  Your physician has requested that you have an echocardiogram in 6 MONTHS. Echocardiography is a painless test that uses sound waves to create images of your heart. It provides your doctor with information about the size and shape of your heart and how well your heart's chambers and valves are working. This procedure takes approximately one hour. There are no restrictions for this procedure.  Your physician recommends that you continue on your current medications as directed. Please refer to the Current Medication list given to you today.

## 2013-01-16 NOTE — Progress Notes (Signed)
HPI:  The patient return in followup today, and we bigeminal rhythm had a nice visit. He seems to be doing extremely well. We discussed his condition in detail. His lipid management is been under control with Dr. [name].  Patient has poor tolerance to statins, and has not been taking them. He does have some underlying coronary artery disease, and as well and underlying cardiomyopathy that appears independent. The patient also has chronic bigeminal rhythm that is made ejection fraction calculation somewhat difficult.  Current Outpatient Prescriptions  Medication Sig Dispense Refill  . aspirin 81 MG tablet Take 81 mg by mouth daily.        . Cinnamon 500 MG capsule Take 500 mg by mouth daily.        . Coenzyme Q10 (COQ10) 200 MG CAPS Take 1 capsule by mouth daily.        . ergocalciferol (VITAMIN D2) 50000 UNITS capsule Take 50,000 Units by mouth once a week.        . fenofibrate 160 MG tablet Take 1 tablet by mouth Daily.      . finasteride (PROSCAR) 5 MG tablet Take 5 mg by mouth daily.        . Glucosamine Sulfate 1000 MG CAPS Take 1 capsule by mouth daily.        . hydrocortisone (CORTEF) 10 MG tablet One and on half tablet daily      . levothyroxine (SYNTHROID, LEVOTHROID) 75 MCG tablet As directed. One tablet every other day. One and one half the other days.      . Multiple Vitamins-Minerals (MEGA MULTI MEN PO) Take 1 tablet by mouth daily.        Marland Kitchen omega-3 acid ethyl esters (LOVAZA) 1 G capsule Take by mouth. Take 4 caps daily       . testosterone cypionate (DEPOTESTOTERONE CYPIONATE) 200 MG/ML injection Inject into the muscle every 14 (fourteen) days. 1.62 % injection        No current facility-administered medications for this visit.    Allergies  Allergen Reactions  . Sulfonamide Derivatives   . Tamsulosin     Past Medical History  Diagnosis Date  . Hyperlipidemia   . BPH (benign prostatic hypertrophy)   . Non-ischemic cardiomyopathy   . Atrial premature beats   .  Coronary artery disease     Past Surgical History  Procedure Laterality Date  . Hand surgery      left  . Cataract extraction    . Back surgery    . Hammer  bunion toe surgery    . Pituitary surgery      12/01/09 Dr. Annalee Genta and Dr. [name].  . Arthroscopic surgery      Family History  Problem Relation Age of Onset  . Heart failure Father 12    deceased  . Other Mother 49    deceased old age  . Coronary artery disease Brother   . Other Sister 81    polio    History   Social History  . Marital Status: Married    Spouse Name: N/A    Number of Children: N/A  . Years of Education: N/A   Occupational History  . retired Associate Professor    Social History Main Topics  . Smoking status: Never Smoker   . Smokeless tobacco: Not on file  . Alcohol Use: No  . Drug Use: No  . Sexually Active: Not on file   Other Topics Concern  . Not on file  Social History Narrative  . No narrative on file    ROS: Please see the HPI.  All other systems reviewed and negative.  PHYSICAL EXAM:  BP 124/70  Pulse 67  Ht 6\' 3"  (1.905 m)  Wt 247 lb (112.038 kg)  BMI 30.87 kg/m2  SpO2 96%  General: Well developed, well nourished, in no acute distress. Head:  Normocephalic and atraumatic. Neck: no JVD Lungs: Clear to auscultation and percussion. Heart: Normal S1 and S2.  No murmur, rubs or gallops.  Frequent extra systoles.    Abdomen:  Normal bowel sounds; soft; non tender; no organomegaly Pulses: Pulses normal in all 4 extremities. Extremities: No clubbing or cyanosis. No edema. Neurologic: Alert and oriented x 3.  EKG:  SB with PVCs in a pattern of bigeminy.  LVH.  No acute changes.  PRIOR ECHO  (8/13)  Study Conclusions  - Left ventricle: The cavity size was moderately dilated. Wall thickness was increased in a pattern of mild LVH. Systolic function was moderately reduced. The estimated ejection fraction was in the range of 35% to 40%. Wall motion was normal; there were  no regional wall motion abnormalities. The study is not technically sufficient to allow evaluation of LV diastolic function due to atrial bigeminy. - Left atrium: The atrium was mildly dilated. - Right ventricle: The cavity size was mildly dilated. Wall thickness was normal. - Right atrium: The atrium was mildly dilated. - Atrial septum: No defect or patent foramen ovale was identified.  Prior CATH  Donnie Aho 2009)  ANGIOGRAPHIC DATA: Left ventriculogram: Performed in the 30-degree RAO  projection. The aortic valve is normal. The mitral valve is normal.  There are frequent ventricular ectopic beats noted most likely PACs.  There was mild diffuse hypokinesis noted with an estimated ejection  fraction between 40% and 45% with some accentuation of the EF post  premature beat. Coronary arteries arise and distribute normally. There  was calcification noted in the left coronary system. The left main  coronary artery is normal. The left anterior descending is calcified  proximally. There is an area of diffuse narrowing in the proximal and  mid segment. There is a focal area, which is eccentric seen best in the  LAO cranial view estimated at no greater than 50%. There is diffuse  disease in the midportion estimated at 30%. There is a focal eccentric  stenosis at the very distal apex estimated at 60%. Circumflex coronary  artery contains moderate irregularity, but no significant focal  obstructive stenoses are noted. Several marginal branches arise. The  right coronary artery is a dominant vessel with scattered irregularity  as the posterior descending and posterolateral branches contain  scattered irregularities with no significant disease.  IMPRESSION:  1. Mild reduction in left ventricular ejection fraction.  2. Coronary artery disease predominantly involving the left anterior  descending in moderate fashion. No severe  focal obstructive stenoses were noted that would be suitable for    intervention. There is only mild irregularity in the other  vessels.    ASSESSMENT AND PLAN:  1.  Moderate non critical CAD 2.  Non ischemic CM 3.  Ventricular bigeminy 4.  Poor medication tolerance---statins, ACE,   Continue follow up with Dr. Shirlee Latch.  Repeat echo.  Hard to judge with vent bigeminy

## 2013-01-16 NOTE — Assessment & Plan Note (Signed)
Poor medication tolerance.  Managed by Dr. Waynard Edwards.

## 2013-01-16 NOTE — Assessment & Plan Note (Signed)
EF reduced out of proportion to CAD.  No prior MI.  Overall EF 35-45%.  No tolerance to statins.  Continue to monitor.  Difficult to judge EF secondary to ectopy.

## 2013-01-16 NOTE — Assessment & Plan Note (Signed)
Non critical CAD, involves mainly LAD.  Continue medical therapy as tolerated.

## 2013-01-19 DIAGNOSIS — J309 Allergic rhinitis, unspecified: Secondary | ICD-10-CM | POA: Diagnosis not present

## 2013-01-26 DIAGNOSIS — J309 Allergic rhinitis, unspecified: Secondary | ICD-10-CM | POA: Diagnosis not present

## 2013-02-02 DIAGNOSIS — J309 Allergic rhinitis, unspecified: Secondary | ICD-10-CM | POA: Diagnosis not present

## 2013-02-04 DIAGNOSIS — E291 Testicular hypofunction: Secondary | ICD-10-CM | POA: Diagnosis not present

## 2013-02-04 DIAGNOSIS — N529 Male erectile dysfunction, unspecified: Secondary | ICD-10-CM | POA: Diagnosis not present

## 2013-02-11 DIAGNOSIS — J309 Allergic rhinitis, unspecified: Secondary | ICD-10-CM | POA: Diagnosis not present

## 2013-02-17 DIAGNOSIS — J309 Allergic rhinitis, unspecified: Secondary | ICD-10-CM | POA: Diagnosis not present

## 2013-02-25 DIAGNOSIS — E291 Testicular hypofunction: Secondary | ICD-10-CM | POA: Diagnosis not present

## 2013-03-04 DIAGNOSIS — J309 Allergic rhinitis, unspecified: Secondary | ICD-10-CM | POA: Diagnosis not present

## 2013-03-06 DIAGNOSIS — E669 Obesity, unspecified: Secondary | ICD-10-CM | POA: Diagnosis not present

## 2013-03-06 DIAGNOSIS — E039 Hypothyroidism, unspecified: Secondary | ICD-10-CM | POA: Diagnosis not present

## 2013-03-06 DIAGNOSIS — R7301 Impaired fasting glucose: Secondary | ICD-10-CM | POA: Diagnosis not present

## 2013-03-06 DIAGNOSIS — Z1331 Encounter for screening for depression: Secondary | ICD-10-CM | POA: Diagnosis not present

## 2013-03-06 DIAGNOSIS — D751 Secondary polycythemia: Secondary | ICD-10-CM | POA: Diagnosis not present

## 2013-03-06 DIAGNOSIS — E291 Testicular hypofunction: Secondary | ICD-10-CM | POA: Diagnosis not present

## 2013-03-06 DIAGNOSIS — I2510 Atherosclerotic heart disease of native coronary artery without angina pectoris: Secondary | ICD-10-CM | POA: Diagnosis not present

## 2013-03-06 DIAGNOSIS — E785 Hyperlipidemia, unspecified: Secondary | ICD-10-CM | POA: Diagnosis not present

## 2013-03-06 DIAGNOSIS — E230 Hypopituitarism: Secondary | ICD-10-CM | POA: Diagnosis not present

## 2013-03-09 DIAGNOSIS — J309 Allergic rhinitis, unspecified: Secondary | ICD-10-CM | POA: Diagnosis not present

## 2013-03-17 DIAGNOSIS — J309 Allergic rhinitis, unspecified: Secondary | ICD-10-CM | POA: Diagnosis not present

## 2013-03-18 DIAGNOSIS — E291 Testicular hypofunction: Secondary | ICD-10-CM | POA: Diagnosis not present

## 2013-03-18 DIAGNOSIS — J309 Allergic rhinitis, unspecified: Secondary | ICD-10-CM | POA: Diagnosis not present

## 2013-03-18 DIAGNOSIS — N529 Male erectile dysfunction, unspecified: Secondary | ICD-10-CM | POA: Diagnosis not present

## 2013-03-18 DIAGNOSIS — Z79899 Other long term (current) drug therapy: Secondary | ICD-10-CM | POA: Diagnosis not present

## 2013-03-24 DIAGNOSIS — J309 Allergic rhinitis, unspecified: Secondary | ICD-10-CM | POA: Diagnosis not present

## 2013-04-01 DIAGNOSIS — J309 Allergic rhinitis, unspecified: Secondary | ICD-10-CM | POA: Diagnosis not present

## 2013-04-08 DIAGNOSIS — J309 Allergic rhinitis, unspecified: Secondary | ICD-10-CM | POA: Diagnosis not present

## 2013-04-08 DIAGNOSIS — E291 Testicular hypofunction: Secondary | ICD-10-CM | POA: Diagnosis not present

## 2013-04-13 DIAGNOSIS — E237 Disorder of pituitary gland, unspecified: Secondary | ICD-10-CM | POA: Diagnosis not present

## 2013-04-17 DIAGNOSIS — J309 Allergic rhinitis, unspecified: Secondary | ICD-10-CM | POA: Diagnosis not present

## 2013-04-20 DIAGNOSIS — Z483 Aftercare following surgery for neoplasm: Secondary | ICD-10-CM | POA: Diagnosis not present

## 2013-04-20 DIAGNOSIS — E237 Disorder of pituitary gland, unspecified: Secondary | ICD-10-CM | POA: Diagnosis not present

## 2013-04-20 DIAGNOSIS — D352 Benign neoplasm of pituitary gland: Secondary | ICD-10-CM | POA: Diagnosis not present

## 2013-04-27 DIAGNOSIS — J309 Allergic rhinitis, unspecified: Secondary | ICD-10-CM | POA: Diagnosis not present

## 2013-04-28 DIAGNOSIS — E291 Testicular hypofunction: Secondary | ICD-10-CM | POA: Diagnosis not present

## 2013-04-29 DIAGNOSIS — J309 Allergic rhinitis, unspecified: Secondary | ICD-10-CM | POA: Diagnosis not present

## 2013-05-05 DIAGNOSIS — J309 Allergic rhinitis, unspecified: Secondary | ICD-10-CM | POA: Diagnosis not present

## 2013-05-13 DIAGNOSIS — J3089 Other allergic rhinitis: Secondary | ICD-10-CM | POA: Diagnosis not present

## 2013-05-13 DIAGNOSIS — J309 Allergic rhinitis, unspecified: Secondary | ICD-10-CM | POA: Diagnosis not present

## 2013-05-13 DIAGNOSIS — J3081 Allergic rhinitis due to animal (cat) (dog) hair and dander: Secondary | ICD-10-CM | POA: Diagnosis not present

## 2013-05-13 DIAGNOSIS — J301 Allergic rhinitis due to pollen: Secondary | ICD-10-CM | POA: Diagnosis not present

## 2013-05-19 DIAGNOSIS — E291 Testicular hypofunction: Secondary | ICD-10-CM | POA: Diagnosis not present

## 2013-05-19 DIAGNOSIS — J309 Allergic rhinitis, unspecified: Secondary | ICD-10-CM | POA: Diagnosis not present

## 2013-05-26 DIAGNOSIS — J309 Allergic rhinitis, unspecified: Secondary | ICD-10-CM | POA: Diagnosis not present

## 2013-06-02 DIAGNOSIS — J309 Allergic rhinitis, unspecified: Secondary | ICD-10-CM | POA: Diagnosis not present

## 2013-06-03 ENCOUNTER — Other Ambulatory Visit: Payer: Self-pay | Admitting: Dermatology

## 2013-06-03 DIAGNOSIS — L570 Actinic keratosis: Secondary | ICD-10-CM | POA: Diagnosis not present

## 2013-06-03 DIAGNOSIS — Z85828 Personal history of other malignant neoplasm of skin: Secondary | ICD-10-CM | POA: Diagnosis not present

## 2013-06-03 DIAGNOSIS — C44519 Basal cell carcinoma of skin of other part of trunk: Secondary | ICD-10-CM | POA: Diagnosis not present

## 2013-06-03 DIAGNOSIS — D485 Neoplasm of uncertain behavior of skin: Secondary | ICD-10-CM | POA: Diagnosis not present

## 2013-06-03 DIAGNOSIS — Z85820 Personal history of malignant melanoma of skin: Secondary | ICD-10-CM | POA: Diagnosis not present

## 2013-06-03 DIAGNOSIS — L439 Lichen planus, unspecified: Secondary | ICD-10-CM | POA: Diagnosis not present

## 2013-06-03 DIAGNOSIS — L821 Other seborrheic keratosis: Secondary | ICD-10-CM | POA: Diagnosis not present

## 2013-06-09 DIAGNOSIS — J309 Allergic rhinitis, unspecified: Secondary | ICD-10-CM | POA: Diagnosis not present

## 2013-06-09 DIAGNOSIS — E291 Testicular hypofunction: Secondary | ICD-10-CM | POA: Diagnosis not present

## 2013-06-16 DIAGNOSIS — J309 Allergic rhinitis, unspecified: Secondary | ICD-10-CM | POA: Diagnosis not present

## 2013-06-23 DIAGNOSIS — J309 Allergic rhinitis, unspecified: Secondary | ICD-10-CM | POA: Diagnosis not present

## 2013-06-30 DIAGNOSIS — E291 Testicular hypofunction: Secondary | ICD-10-CM | POA: Diagnosis not present

## 2013-06-30 DIAGNOSIS — Z79899 Other long term (current) drug therapy: Secondary | ICD-10-CM | POA: Diagnosis not present

## 2013-07-06 DIAGNOSIS — K5900 Constipation, unspecified: Secondary | ICD-10-CM | POA: Diagnosis not present

## 2013-07-06 DIAGNOSIS — Z6828 Body mass index (BMI) 28.0-28.9, adult: Secondary | ICD-10-CM | POA: Diagnosis not present

## 2013-07-06 DIAGNOSIS — E230 Hypopituitarism: Secondary | ICD-10-CM | POA: Diagnosis not present

## 2013-07-06 DIAGNOSIS — J309 Allergic rhinitis, unspecified: Secondary | ICD-10-CM | POA: Diagnosis not present

## 2013-07-06 DIAGNOSIS — D751 Secondary polycythemia: Secondary | ICD-10-CM | POA: Diagnosis not present

## 2013-07-06 DIAGNOSIS — E785 Hyperlipidemia, unspecified: Secondary | ICD-10-CM | POA: Diagnosis not present

## 2013-07-06 DIAGNOSIS — R7301 Impaired fasting glucose: Secondary | ICD-10-CM | POA: Diagnosis not present

## 2013-07-14 ENCOUNTER — Other Ambulatory Visit (HOSPITAL_COMMUNITY): Payer: Medicare Other

## 2013-07-16 DIAGNOSIS — J309 Allergic rhinitis, unspecified: Secondary | ICD-10-CM | POA: Diagnosis not present

## 2013-07-21 DIAGNOSIS — J309 Allergic rhinitis, unspecified: Secondary | ICD-10-CM | POA: Diagnosis not present

## 2013-07-21 DIAGNOSIS — E291 Testicular hypofunction: Secondary | ICD-10-CM | POA: Diagnosis not present

## 2013-07-28 DIAGNOSIS — J309 Allergic rhinitis, unspecified: Secondary | ICD-10-CM | POA: Diagnosis not present

## 2013-07-29 DIAGNOSIS — N400 Enlarged prostate without lower urinary tract symptoms: Secondary | ICD-10-CM | POA: Diagnosis not present

## 2013-08-05 DIAGNOSIS — N139 Obstructive and reflux uropathy, unspecified: Secondary | ICD-10-CM | POA: Diagnosis not present

## 2013-08-05 DIAGNOSIS — R3915 Urgency of urination: Secondary | ICD-10-CM | POA: Diagnosis not present

## 2013-08-05 DIAGNOSIS — R972 Elevated prostate specific antigen [PSA]: Secondary | ICD-10-CM | POA: Diagnosis not present

## 2013-08-05 DIAGNOSIS — N138 Other obstructive and reflux uropathy: Secondary | ICD-10-CM | POA: Diagnosis not present

## 2013-08-05 DIAGNOSIS — N401 Enlarged prostate with lower urinary tract symptoms: Secondary | ICD-10-CM | POA: Diagnosis not present

## 2013-08-06 DIAGNOSIS — J309 Allergic rhinitis, unspecified: Secondary | ICD-10-CM | POA: Diagnosis not present

## 2013-08-12 ENCOUNTER — Encounter: Payer: Self-pay | Admitting: Cardiology

## 2013-08-12 ENCOUNTER — Ambulatory Visit (HOSPITAL_COMMUNITY): Payer: Medicare Other | Attending: Cardiology | Admitting: Cardiology

## 2013-08-12 ENCOUNTER — Encounter: Payer: Self-pay | Admitting: Cardiovascular Disease

## 2013-08-12 ENCOUNTER — Other Ambulatory Visit (HOSPITAL_COMMUNITY): Payer: Medicare Other

## 2013-08-12 ENCOUNTER — Ambulatory Visit (INDEPENDENT_AMBULATORY_CARE_PROVIDER_SITE_OTHER): Payer: Medicare Other | Admitting: Cardiology

## 2013-08-12 ENCOUNTER — Encounter (INDEPENDENT_AMBULATORY_CARE_PROVIDER_SITE_OTHER): Payer: Self-pay

## 2013-08-12 VITALS — BP 152/80 | HR 63 | Ht 75.0 in | Wt 245.0 lb

## 2013-08-12 DIAGNOSIS — E7800 Pure hypercholesterolemia, unspecified: Secondary | ICD-10-CM | POA: Diagnosis not present

## 2013-08-12 DIAGNOSIS — I2510 Atherosclerotic heart disease of native coronary artery without angina pectoris: Secondary | ICD-10-CM | POA: Diagnosis not present

## 2013-08-12 DIAGNOSIS — E785 Hyperlipidemia, unspecified: Secondary | ICD-10-CM | POA: Diagnosis not present

## 2013-08-12 DIAGNOSIS — E291 Testicular hypofunction: Secondary | ICD-10-CM | POA: Diagnosis not present

## 2013-08-12 DIAGNOSIS — I428 Other cardiomyopathies: Secondary | ICD-10-CM | POA: Diagnosis not present

## 2013-08-12 DIAGNOSIS — J309 Allergic rhinitis, unspecified: Secondary | ICD-10-CM | POA: Diagnosis not present

## 2013-08-12 MED ORDER — CARVEDILOL 3.125 MG PO TABS: 3.1250 mg | ORAL_TABLET | Freq: Two times a day (BID) | ORAL | Status: DC

## 2013-08-12 NOTE — Patient Instructions (Signed)
Start coreg(carvedilol) 3.125mg  two times a day.   Your physician recommends that you schedule a follow-up appointment in: 4 months with Dr Shirlee Latch.

## 2013-08-12 NOTE — Progress Notes (Signed)
Patient ID: [name], male   DOB: 12-08-1933, 79 y.o.   MRN: [id number] PCP: Dr. Waynard Edwards  79 yo with history of pituitary adenoma s/p resection, nonobstructive CAD, nonischemic cardiomyopathy, and chronic atrial bigeminy presents for cardiology followup.  Patient was seen by Dr. Riley Kill in the past and is seen by me for the first time today.  Patient has a long history of atrial bigeminy.  He is in this rhythm today.  He has a nonischemic cardiomyopathy with EF 40% by echo today.  Prior cardiac cath showed nonobstructive disease.  Symptomatically, he does well.  No exertional dyspnea or chest pain.  He hunts a lot and has no problems walking in the woods up and down hills.  He has not been able to tolerate statins.   ECG: NSR with bigeminal PACs.   Labs (5/12): K 4.3, creatinine 1.03  PMH: 1. Hyperlipidemia: He has been unable to tolerate statins.  2. Chronic atrial bigeminy: x years.  3. Hypothyroidism: s/p surgery for pituitary adenoma.  4. Pituitary adenoma: s/p surgery in 3/11 for removal.  Now on thyroid replacement and hydrocortisone.  5. Cardiomyopathy: Nonischemic.  LHC (2009) with 50% mid LAD, 60% distal LAD.  Left ventriculogram (2009) with EF 40-45%.  Echo (8/13) with EF 35-40%, mild LVH.  Echo (11/14) with EF 40%, diffuse hypokinesis, normal RV.  He was tried in the past on an ACEI but apparently at that time began to have side effects from his pituitary adenoma that were thought initially to be ACEI-related.   He has been off ACEI since that time.  6. CAD: Nonobstructive.  2009 LHC with 50% mid, 60% distal LAD.   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   FH: Brother with CAD.  ROS: All systems reviewed and negative except as per HPI.   Current Outpatient Prescriptions  Medication Sig Dispense Refill  . aspirin 81 MG tablet Take 81 mg by mouth daily.        . Cinnamon 500 MG capsule Take 500 mg by mouth daily.        . Coenzyme Q10 (COQ10) 200 MG  CAPS Take 1 capsule by mouth daily.        . Colesevelam HCl (WELCHOL PO) Take by mouth. TAKE AS DIRECTED      . ergocalciferol (VITAMIN D2) 50000 UNITS capsule Take 50,000 Units by mouth once a week.        . fenofibrate 160 MG tablet Take 1 tablet by mouth Daily.      . finasteride (PROSCAR) 5 MG tablet Take 5 mg by mouth daily.        . Glucosamine Sulfate 1000 MG CAPS Take 1 capsule by mouth daily.        . hydrocortisone (CORTEF) 10 MG tablet One and on half tablet daily      . levothyroxine (SYNTHROID, LEVOTHROID) 75 MCG tablet As directed. One tablet every other day. One and one half the other days.      . Multiple Vitamins-Minerals (MEGA MULTI MEN PO) Take 1 tablet by mouth daily.        Marland Kitchen omega-3 acid ethyl esters (LOVAZA) 1 G capsule Take by mouth. Take 4 caps daily       . sildenafil (REVATIO) 20 MG tablet Take 20 mg by mouth as needed.      . Solifenacin Succinate (VESICARE PO) Take by mouth. TAKE ONE TABLET DAILY      . testosterone cypionate (DEPOTESTOTERONE CYPIONATE) 200 MG/ML  injection Inject into the muscle every 14 (fourteen) days. 1.62 % injection       . carvedilol (COREG) 3.125 MG tablet Take 1 tablet (3.125 mg total) by mouth 2 (two) times daily.  60 tablet  6   No current facility-administered medications for this visit.    BP 152/80  Pulse 63  Ht 6\' 3"  (1.905 m)  Wt 245 lb (111.131 kg)  BMI 30.62 kg/m2 General: NAD Neck: No JVD, no thyromegaly or thyroid nodule.  Lungs: Clear to auscultation bilaterally with normal respiratory effort. CV: Nondisplaced PMI.  Heart regular S1/S2, no S3/S4, no murmur.  1+ ankle edema.  No carotid bruit.  Normal pedal pulses.  Abdomen: Soft, nontender, no hepatosplenomegaly, no distention.  Skin: Intact without lesions or rashes.  Neurologic: Alert and oriented x 3.  Psych: Normal affect. Extremities: No clubbing or cyanosis.   Assessment/Plan: 1. Nonischemic cardiomyopathy: LHC (2009) with nonobstructive disease.  EF 40% with  diffuse hypokinesis on echo today.  He is euvolemic with NYHA class I-II symptoms. - Frequent PVCs can cause cardiomyopathy.  This patient has may PACs.  I am going to need to research whether frequent PACs can cause a cardiomyopathy.  Will discuss with EP.  - Add Coreg 3.125 mg bid.   - Would eventually like to restart an ACEI as I think that the symptoms that the patient had prior while on ACEI were actually due to his pituitary adenoma.  2. CAD: Nonobstructive on LHC in 2009.  He has not tolerated statins.  Will continue ASA 81.  3. Hyperlipidemia: On Welchol.  Will get most recent lipids from PCP.   Marca Ancona 08/12/2013

## 2013-08-12 NOTE — Progress Notes (Signed)
Echo performed. 

## 2013-08-17 DIAGNOSIS — J309 Allergic rhinitis, unspecified: Secondary | ICD-10-CM | POA: Diagnosis not present

## 2013-08-24 DIAGNOSIS — J309 Allergic rhinitis, unspecified: Secondary | ICD-10-CM | POA: Diagnosis not present

## 2013-09-02 DIAGNOSIS — E291 Testicular hypofunction: Secondary | ICD-10-CM | POA: Diagnosis not present

## 2013-09-02 DIAGNOSIS — J309 Allergic rhinitis, unspecified: Secondary | ICD-10-CM | POA: Diagnosis not present

## 2013-09-07 DIAGNOSIS — J309 Allergic rhinitis, unspecified: Secondary | ICD-10-CM | POA: Diagnosis not present

## 2013-09-14 DIAGNOSIS — J309 Allergic rhinitis, unspecified: Secondary | ICD-10-CM | POA: Diagnosis not present

## 2013-09-17 DIAGNOSIS — R972 Elevated prostate specific antigen [PSA]: Secondary | ICD-10-CM | POA: Diagnosis not present

## 2013-09-22 DIAGNOSIS — J309 Allergic rhinitis, unspecified: Secondary | ICD-10-CM | POA: Diagnosis not present

## 2013-09-22 DIAGNOSIS — E291 Testicular hypofunction: Secondary | ICD-10-CM | POA: Diagnosis not present

## 2013-09-23 DIAGNOSIS — J309 Allergic rhinitis, unspecified: Secondary | ICD-10-CM | POA: Diagnosis not present

## 2013-09-29 DIAGNOSIS — J309 Allergic rhinitis, unspecified: Secondary | ICD-10-CM | POA: Diagnosis not present

## 2013-09-30 DIAGNOSIS — R972 Elevated prostate specific antigen [PSA]: Secondary | ICD-10-CM | POA: Diagnosis not present

## 2013-09-30 DIAGNOSIS — N401 Enlarged prostate with lower urinary tract symptoms: Secondary | ICD-10-CM | POA: Diagnosis not present

## 2013-09-30 DIAGNOSIS — N139 Obstructive and reflux uropathy, unspecified: Secondary | ICD-10-CM | POA: Diagnosis not present

## 2013-09-30 DIAGNOSIS — N138 Other obstructive and reflux uropathy: Secondary | ICD-10-CM | POA: Diagnosis not present

## 2013-09-30 DIAGNOSIS — R3915 Urgency of urination: Secondary | ICD-10-CM | POA: Diagnosis not present

## 2013-10-01 DIAGNOSIS — R413 Other amnesia: Secondary | ICD-10-CM

## 2013-10-01 HISTORY — DX: Other amnesia: R41.3

## 2013-10-06 DIAGNOSIS — J309 Allergic rhinitis, unspecified: Secondary | ICD-10-CM | POA: Diagnosis not present

## 2013-10-12 DIAGNOSIS — J309 Allergic rhinitis, unspecified: Secondary | ICD-10-CM | POA: Diagnosis not present

## 2013-10-13 DIAGNOSIS — E291 Testicular hypofunction: Secondary | ICD-10-CM | POA: Diagnosis not present

## 2013-10-22 DIAGNOSIS — J309 Allergic rhinitis, unspecified: Secondary | ICD-10-CM | POA: Diagnosis not present

## 2013-10-27 DIAGNOSIS — J309 Allergic rhinitis, unspecified: Secondary | ICD-10-CM | POA: Diagnosis not present

## 2013-11-02 DIAGNOSIS — J309 Allergic rhinitis, unspecified: Secondary | ICD-10-CM | POA: Diagnosis not present

## 2013-11-03 DIAGNOSIS — E291 Testicular hypofunction: Secondary | ICD-10-CM | POA: Diagnosis not present

## 2013-11-10 DIAGNOSIS — J309 Allergic rhinitis, unspecified: Secondary | ICD-10-CM | POA: Diagnosis not present

## 2013-11-18 DIAGNOSIS — J309 Allergic rhinitis, unspecified: Secondary | ICD-10-CM | POA: Diagnosis not present

## 2013-11-19 ENCOUNTER — Encounter: Payer: Self-pay | Admitting: Cardiology

## 2013-11-19 DIAGNOSIS — Z125 Encounter for screening for malignant neoplasm of prostate: Secondary | ICD-10-CM | POA: Diagnosis not present

## 2013-11-19 DIAGNOSIS — E039 Hypothyroidism, unspecified: Secondary | ICD-10-CM | POA: Diagnosis not present

## 2013-11-19 DIAGNOSIS — I2510 Atherosclerotic heart disease of native coronary artery without angina pectoris: Secondary | ICD-10-CM | POA: Diagnosis not present

## 2013-11-19 DIAGNOSIS — Z79899 Other long term (current) drug therapy: Secondary | ICD-10-CM | POA: Diagnosis not present

## 2013-11-19 DIAGNOSIS — R7301 Impaired fasting glucose: Secondary | ICD-10-CM | POA: Diagnosis not present

## 2013-11-19 DIAGNOSIS — E785 Hyperlipidemia, unspecified: Secondary | ICD-10-CM | POA: Diagnosis not present

## 2013-11-19 DIAGNOSIS — E559 Vitamin D deficiency, unspecified: Secondary | ICD-10-CM | POA: Diagnosis not present

## 2013-11-25 DIAGNOSIS — J309 Allergic rhinitis, unspecified: Secondary | ICD-10-CM | POA: Diagnosis not present

## 2013-11-27 DIAGNOSIS — E291 Testicular hypofunction: Secondary | ICD-10-CM | POA: Diagnosis not present

## 2013-11-30 DIAGNOSIS — J309 Allergic rhinitis, unspecified: Secondary | ICD-10-CM | POA: Diagnosis not present

## 2013-12-02 DIAGNOSIS — Z0000 Encounter for general adult medical examination without abnormal findings: Secondary | ICD-10-CM | POA: Diagnosis not present

## 2013-12-02 DIAGNOSIS — E669 Obesity, unspecified: Secondary | ICD-10-CM | POA: Diagnosis not present

## 2013-12-02 DIAGNOSIS — L723 Sebaceous cyst: Secondary | ICD-10-CM | POA: Diagnosis not present

## 2013-12-02 DIAGNOSIS — Z85828 Personal history of other malignant neoplasm of skin: Secondary | ICD-10-CM | POA: Diagnosis not present

## 2013-12-02 DIAGNOSIS — L820 Inflamed seborrheic keratosis: Secondary | ICD-10-CM | POA: Diagnosis not present

## 2013-12-02 DIAGNOSIS — M1990 Unspecified osteoarthritis, unspecified site: Secondary | ICD-10-CM | POA: Diagnosis not present

## 2013-12-02 DIAGNOSIS — E230 Hypopituitarism: Secondary | ICD-10-CM | POA: Diagnosis not present

## 2013-12-02 DIAGNOSIS — E039 Hypothyroidism, unspecified: Secondary | ICD-10-CM | POA: Diagnosis not present

## 2013-12-02 DIAGNOSIS — G4700 Insomnia, unspecified: Secondary | ICD-10-CM | POA: Diagnosis not present

## 2013-12-02 DIAGNOSIS — L570 Actinic keratosis: Secondary | ICD-10-CM | POA: Diagnosis not present

## 2013-12-02 DIAGNOSIS — D485 Neoplasm of uncertain behavior of skin: Secondary | ICD-10-CM | POA: Diagnosis not present

## 2013-12-02 DIAGNOSIS — N529 Male erectile dysfunction, unspecified: Secondary | ICD-10-CM | POA: Diagnosis not present

## 2013-12-02 DIAGNOSIS — R609 Edema, unspecified: Secondary | ICD-10-CM | POA: Diagnosis not present

## 2013-12-02 DIAGNOSIS — L821 Other seborrheic keratosis: Secondary | ICD-10-CM | POA: Diagnosis not present

## 2013-12-02 DIAGNOSIS — Z85820 Personal history of malignant melanoma of skin: Secondary | ICD-10-CM | POA: Diagnosis not present

## 2013-12-08 DIAGNOSIS — J309 Allergic rhinitis, unspecified: Secondary | ICD-10-CM | POA: Diagnosis not present

## 2013-12-09 ENCOUNTER — Encounter: Payer: Self-pay | Admitting: Cardiology

## 2013-12-09 ENCOUNTER — Ambulatory Visit: Payer: Medicare Other | Admitting: Cardiology

## 2013-12-09 ENCOUNTER — Ambulatory Visit (INDEPENDENT_AMBULATORY_CARE_PROVIDER_SITE_OTHER): Payer: Medicare Other | Admitting: Cardiology

## 2013-12-09 VITALS — BP 120/62 | HR 35 | Ht 75.0 in | Wt 240.0 lb

## 2013-12-09 DIAGNOSIS — I498 Other specified cardiac arrhythmias: Secondary | ICD-10-CM

## 2013-12-09 DIAGNOSIS — I428 Other cardiomyopathies: Secondary | ICD-10-CM

## 2013-12-09 DIAGNOSIS — I491 Atrial premature depolarization: Secondary | ICD-10-CM

## 2013-12-09 DIAGNOSIS — I2510 Atherosclerotic heart disease of native coronary artery without angina pectoris: Secondary | ICD-10-CM | POA: Diagnosis not present

## 2013-12-09 MED ORDER — CARVEDILOL 3.125 MG PO TABS: 3.1250 mg | ORAL_TABLET | Freq: Two times a day (BID) | ORAL | Status: DC

## 2013-12-09 NOTE — Patient Instructions (Signed)
Your physician has recommended you make the following change in your medication: Increase Coreg to 3.125 MG 1 tablet BID  Your physician wants you to follow-up in: 6 Months with Dr Kendall Flack will receive a reminder letter in the mail two months in advance. If you don't receive a letter, please call our office to schedule the follow-up appointment.

## 2013-12-10 DIAGNOSIS — I491 Atrial premature depolarization: Secondary | ICD-10-CM | POA: Insufficient documentation

## 2013-12-10 NOTE — Progress Notes (Signed)
Patient ID: [name], male   DOB: 12-24-1933, 80 y.o.   MRN: [id number] PCP: [name]  80 yo with history of pituitary adenoma s/p resection, nonobstructive CAD, nonischemic cardiomyopathy, and chronic atrial bigeminy presents for cardiology followup.  Patient has a long history of atrial bigeminy.  He is in this rhythm today.  He has a nonischemic cardiomyopathy with EF 40% by last echo.  Prior cardiac cath showed nonobstructive disease.  Symptomatically, he does well. No exertional dyspnea or chest pain.  He hunts a lot and has no problems walking in the woods up and down hills.  He has not been able to tolerate statins.  His LDL remains high despite colesevelam.  It was increased by Dr Joylene Draft recently.  At last appointment, I tried to start him on Coreg 3.125 mg bid.  He got lightheaded with this dose of Coreg so he is now taking 1/2 tablet of 3.125 mg Coreg once a day.  He can tolerate this.   ECG: NSR with bigeminal PACs.   Labs (5/12): K 4.3, creatinine 1.03 Labs (2/15): K 4, creatinine 1.1, LDL 184  PMH: 1. Hyperlipidemia: He has been unable to tolerate statins.  2. Chronic atrial bigeminy: x years.  3. Hypothyroidism: s/p surgery for pituitary adenoma.  4. Pituitary adenoma: s/p surgery in 3/11 for removal.  Now on thyroid replacement and hydrocortisone.  5. Cardiomyopathy: Nonischemic.  LHC (2009) with 50% mid LAD, 60% distal LAD.  Left ventriculogram (2009) with EF 40-45%.  Echo (8/13) with EF 35-40%, mild LVH.  Echo (11/14) with EF 40%, diffuse hypokinesis, normal RV.  He was tried in the past on an ACEI but apparently at that time began to have side effects from his pituitary adenoma that were thought initially to be ACEI-related.   He has been off ACEI since that time.  6. CAD: Nonobstructive.  2009 LHC with 50% mid, 60% distal LAD.   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   FH: Brother with CAD.  ROS: All systems reviewed and negative except as  per HPI.   Current Outpatient Prescriptions  Medication Sig Dispense Refill  . aspirin 81 MG tablet Take 81 mg by mouth daily.        . carvedilol (COREG) 3.125 MG tablet Take 1 tablet (3.125 mg total) by mouth 2 (two) times daily with a meal.  30 tablet  6  . Cinnamon 500 MG capsule Take 500 mg by mouth daily.        . Coenzyme Q10 (COQ10) 200 MG CAPS Take 1 capsule by mouth daily.        . Colesevelam HCl (WELCHOL PO) Take 6 tablets by mouth daily. TAKE AS DIRECTED      . ergocalciferol (VITAMIN D2) 50000 UNITS capsule Take 50,000 Units by mouth once a week.        . fenofibrate 160 MG tablet Take 1 tablet by mouth Daily.      . finasteride (PROSCAR) 5 MG tablet Take 5 mg by mouth daily.        . Glucosamine Sulfate 1000 MG CAPS Take 1 capsule by mouth daily.        . hydrocortisone (CORTEF) 10 MG tablet One and on half tablet daily      . levothyroxine (SYNTHROID, LEVOTHROID) 100 MCG tablet Take 100 mcg by mouth daily before breakfast.      . Multiple Vitamins-Minerals (MEGA MULTI MEN PO) Take 1 tablet by mouth daily.        Marland Kitchen  omega-3 acid ethyl esters (LOVAZA) 1 G capsule Take by mouth. Take 4 caps daily       . sildenafil (REVATIO) 20 MG tablet Take 20 mg by mouth as needed.      . Solifenacin Succinate (VESICARE PO) Take by mouth. TAKE ONE TABLET DAILY      . testosterone cypionate (DEPOTESTOTERONE CYPIONATE) 200 MG/ML injection Inject into the muscle every 14 (fourteen) days. 1.62 % injection        No current facility-administered medications for this visit.    BP 120/62  Pulse 35  Ht 6\' 3"  (1.905 m)  Wt 108.863 kg (240 lb)  BMI 30.00 kg/m2  SpO2 95% General: NAD Neck: No JVD, no thyromegaly or thyroid nodule.  Lungs: Clear to auscultation bilaterally with normal respiratory effort. CV: Nondisplaced PMI.  Heart regular S1/S2, no S3/S4, no murmur.  1+ ankle edema.  No carotid bruit.  Normal pedal pulses.  Abdomen: Soft, nontender, no hepatosplenomegaly, no distention.  Skin:  Intact without lesions or rashes.  Neurologic: Alert and oriented x 3.  Psych: Normal affect. Extremities: No clubbing or cyanosis.   Assessment/Plan: 1. Nonischemic cardiomyopathy: LHC (2009) with nonobstructive disease.  EF 40% with diffuse hypokinesis on last echo.  He is euvolemic with NYHA class I symptoms. - Frequent PVCs can cause cardiomyopathy.  This patient has many PACs. Have discussed with EP, plan medical treatment for now and follow echo.   - He did not tolerate Coreg 3.125 mg bid due to lightheadedness.  This may be related to his pituitary insufficiency (difficulty tolerating BP-active medications).  I am going to have him take Coreg 1/2 tablet of 3.125 mg bid (instead of qd).   - Would eventually like to restart an ACEI as I think that the symptoms that the patient had prior while on ACEI were actually due to his pituitary adenoma.  However, he may not be able to tolerate it in terms of lightheadedness/orthostatic symptoms.   2. CAD: Nonobstructive on LHC in 2009.  He has not tolerated statins.  Will continue ASA 81.  3. Hyperlipidemia: He has not tolerated multiple statins.  On Welchol, recently increased.  LDL has been quite elevated.  If repeat LDL is still high, would start him on Zetia 10 mg daily.  He will be a candidate for clinical trials of non-statin lipid lowering agents that should be available through our office soon.    Followup in 6 months  [name] 12/10/2013

## 2013-12-14 DIAGNOSIS — J309 Allergic rhinitis, unspecified: Secondary | ICD-10-CM | POA: Diagnosis not present

## 2013-12-16 DIAGNOSIS — J309 Allergic rhinitis, unspecified: Secondary | ICD-10-CM | POA: Diagnosis not present

## 2013-12-16 DIAGNOSIS — E291 Testicular hypofunction: Secondary | ICD-10-CM | POA: Diagnosis not present

## 2013-12-21 DIAGNOSIS — J309 Allergic rhinitis, unspecified: Secondary | ICD-10-CM | POA: Diagnosis not present

## 2013-12-23 DIAGNOSIS — Z1212 Encounter for screening for malignant neoplasm of rectum: Secondary | ICD-10-CM | POA: Diagnosis not present

## 2013-12-24 ENCOUNTER — Other Ambulatory Visit: Payer: Self-pay | Admitting: Dermatology

## 2013-12-24 DIAGNOSIS — L821 Other seborrheic keratosis: Secondary | ICD-10-CM | POA: Diagnosis not present

## 2013-12-24 DIAGNOSIS — C44319 Basal cell carcinoma of skin of other parts of face: Secondary | ICD-10-CM | POA: Diagnosis not present

## 2013-12-24 DIAGNOSIS — L820 Inflamed seborrheic keratosis: Secondary | ICD-10-CM | POA: Diagnosis not present

## 2013-12-24 DIAGNOSIS — D485 Neoplasm of uncertain behavior of skin: Secondary | ICD-10-CM | POA: Diagnosis not present

## 2013-12-24 DIAGNOSIS — Z85828 Personal history of other malignant neoplasm of skin: Secondary | ICD-10-CM | POA: Diagnosis not present

## 2013-12-25 DIAGNOSIS — J309 Allergic rhinitis, unspecified: Secondary | ICD-10-CM | POA: Diagnosis not present

## 2013-12-31 DIAGNOSIS — J309 Allergic rhinitis, unspecified: Secondary | ICD-10-CM | POA: Diagnosis not present

## 2014-01-05 DIAGNOSIS — E291 Testicular hypofunction: Secondary | ICD-10-CM | POA: Diagnosis not present

## 2014-01-06 DIAGNOSIS — J309 Allergic rhinitis, unspecified: Secondary | ICD-10-CM | POA: Diagnosis not present

## 2014-01-12 ENCOUNTER — Encounter: Payer: Self-pay | Admitting: *Deleted

## 2014-01-12 DIAGNOSIS — J309 Allergic rhinitis, unspecified: Secondary | ICD-10-CM | POA: Diagnosis not present

## 2014-01-13 ENCOUNTER — Telehealth: Payer: Self-pay | Admitting: Neurology

## 2014-01-13 ENCOUNTER — Encounter (INDEPENDENT_AMBULATORY_CARE_PROVIDER_SITE_OTHER): Payer: Self-pay

## 2014-01-13 ENCOUNTER — Encounter: Payer: Self-pay | Admitting: Neurology

## 2014-01-13 ENCOUNTER — Ambulatory Visit (INDEPENDENT_AMBULATORY_CARE_PROVIDER_SITE_OTHER): Payer: Medicare Other | Admitting: Neurology

## 2014-01-13 VITALS — BP 130/70 | HR 56 | Resp 16 | Ht 74.0 in | Wt 241.0 lb

## 2014-01-13 DIAGNOSIS — D352 Benign neoplasm of pituitary gland: Secondary | ICD-10-CM

## 2014-01-13 DIAGNOSIS — G3184 Mild cognitive impairment, so stated: Secondary | ICD-10-CM

## 2014-01-13 DIAGNOSIS — R413 Other amnesia: Secondary | ICD-10-CM | POA: Diagnosis not present

## 2014-01-13 DIAGNOSIS — F09 Unspecified mental disorder due to known physiological condition: Secondary | ICD-10-CM | POA: Insufficient documentation

## 2014-01-13 DIAGNOSIS — I2510 Atherosclerotic heart disease of native coronary artery without angina pectoris: Secondary | ICD-10-CM | POA: Diagnosis not present

## 2014-01-13 DIAGNOSIS — D353 Benign neoplasm of craniopharyngeal duct: Secondary | ICD-10-CM | POA: Diagnosis not present

## 2014-01-13 NOTE — Telephone Encounter (Signed)
Spoke with [name].  She said they dropped the Xanax packet they were given for MRI and would like to know if they can either come back to the office later in the week to get another one, or if Rx can be called into the pharmacy.  Please advise.  Thank you.

## 2014-01-13 NOTE — Patient Instructions (Signed)
Pituitary Tumors Pituitary tumors are abnormal growths found in the pituitary gland. The pituitary gland is a small organ about the size of a dime located in the center of the brain. It makes hormones that affect growth and the functions of other glands in the body. Most pituitary tumors are benign. This means they are noncancerous. They grow slowly and do not spread to other parts of the body. A pituitary tumor may make the pituitary gland produce too many hormones. Tumors that make hormones are called functioning tumors (those that do not make hormones are called nonfunctioning tumors). Problems that can be caused by pituitary tumors include:  Cushing disease. This disease causes fat to build up in the face, back, and chest while the arms and legs become thin.  Acromegaly. This is a condition in which the hands, feet, and face are larger than normal.  Breast milk production even though there is no pregnancy. CAUSES  The cause of most pituitary tumors is not known. In some cases, these kinds of tumors run in a family. RISK FACTORS Some cases of pituitary tumors are due to genetic factors that a person inherits that increase the likelihood of developing certain tumors, including pituitary tumors. SIGNS AND SYMPTOMS   Headaches.   Vision problems.   Weakness or low energy.  Clear fluid draining from the nose.  Changes in the sense of smell.  Feeling sick to your stomach (nauseous) and vomiting.   Problems caused by the production of too many hormones, such as:   Infertility.  Loss of menstrual periods in women.   Abnormal growth.   High blood pressure (hypertension).   Heat or cold intolerance.   Other skin and body changes.   Nipple discharge.  Decreased sexual function. DIAGNOSIS  If you develop symptoms, you will be sent for a CT scan or MRI to look for pituitary tumors. If you know that these kinds of tumors run in your family, you may need to have your blood  tested regularly to monitor pituitary hormone levels. TREATMENT  These tumors are best treated when they are found and diagnosed early. Treatments include:   Surgical removal of the tumor. This is the most common treatment.  Radiation therapy. During this treatment, high doses of X-rays are used to kill tumor cells.  Drug therapy. This involves using certain medicines to block the pituitary gland from producing too many hormones. HOME CARE INSTRUCTIONS  Drink plenty of fluids.  Measure your urine output if directed to do so by your health care provider.  Do not pick your nose or remove any crusting.  Do not do any activities that require straining.  Take all medicines as directed by your health care provider.  Keep follow-up appointments as directed by your health care provider. SEEK MEDICAL CARE IF:  You have sudden, unusual thirst.  You are urinating frequently.  You have a headache that will not go away.  You have new vision changes.  You notice clear fluid leaking from your nose or ears, a sensation of fluid trickling down the back of your throat, or a salty taste in your mouth.  You are having trouble concentrating. SEEK IMMEDIATE MEDICAL CARE IF:  Your symptoms suddenly become severe.  You have a nosebleed that does not stop after a few minutes.  You have a fever over 101F (38.3C).  You have a severe headache or a stiff neck.  You are confused or not as alert as usual.  You have chest pain or  shortness of breath. Document Released: 09/07/2002 Document Revised: 07/08/2013 Document Reviewed: 03/20/2013 [facility] Patient Information 2014 [place].

## 2014-01-13 NOTE — Addendum Note (Signed)
Addended by: Larey Seat on: 01/13/2014 11:14 AM   Modules accepted: Orders
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
## 2014-01-13 NOTE — Telephone Encounter (Signed)
Patient just saw Dr. Nance Pear gave patient samples of Valium to relax him before an MRI--patient left medication @ our office--patient will come by to pick up more medication--please call--I could not find @ front desk--thank you.

## 2014-01-19 DIAGNOSIS — J309 Allergic rhinitis, unspecified: Secondary | ICD-10-CM | POA: Diagnosis not present

## 2014-01-21 DIAGNOSIS — C44319 Basal cell carcinoma of skin of other parts of face: Secondary | ICD-10-CM | POA: Diagnosis not present

## 2014-01-21 DIAGNOSIS — Z85828 Personal history of other malignant neoplasm of skin: Secondary | ICD-10-CM | POA: Diagnosis not present

## 2014-01-25 DIAGNOSIS — J309 Allergic rhinitis, unspecified: Secondary | ICD-10-CM | POA: Diagnosis not present

## 2014-01-26 DIAGNOSIS — E291 Testicular hypofunction: Secondary | ICD-10-CM | POA: Diagnosis not present

## 2014-02-04 DIAGNOSIS — J309 Allergic rhinitis, unspecified: Secondary | ICD-10-CM | POA: Diagnosis not present

## 2014-02-09 DIAGNOSIS — J309 Allergic rhinitis, unspecified: Secondary | ICD-10-CM | POA: Diagnosis not present

## 2014-02-10 ENCOUNTER — Ambulatory Visit
Admission: RE | Admit: 2014-02-10 | Discharge: 2014-02-10 | Disposition: A | Discharge status: Home / Self Care | Payer: Medicare Other | Source: Ambulatory Visit | Attending: Neurology | Admitting: Neurology

## 2014-02-10 DIAGNOSIS — D352 Benign neoplasm of pituitary gland: Secondary | ICD-10-CM | POA: Diagnosis not present

## 2014-02-10 DIAGNOSIS — D353 Benign neoplasm of craniopharyngeal duct: Secondary | ICD-10-CM | POA: Diagnosis not present

## 2014-02-10 DIAGNOSIS — G3184 Mild cognitive impairment, so stated: Secondary | ICD-10-CM

## 2014-02-10 DIAGNOSIS — S0990XA Unspecified injury of head, initial encounter: Secondary | ICD-10-CM | POA: Diagnosis not present

## 2014-02-10 MED ORDER — GADOBENATE DIMEGLUMINE 529 MG/ML IV SOLN
10.0000 mL | Freq: Once | INTRAVENOUS | Status: AC | PRN
  Administered 2014-02-10: 10 mL via INTRAVENOUS

## 2014-02-17 DIAGNOSIS — J309 Allergic rhinitis, unspecified: Secondary | ICD-10-CM | POA: Diagnosis not present

## 2014-02-17 DIAGNOSIS — E291 Testicular hypofunction: Secondary | ICD-10-CM | POA: Diagnosis not present

## 2014-02-23 DIAGNOSIS — T8131XA Disruption of external operation (surgical) wound, not elsewhere classified, initial encounter: Secondary | ICD-10-CM | POA: Diagnosis not present

## 2014-02-23 DIAGNOSIS — C44319 Basal cell carcinoma of skin of other parts of face: Secondary | ICD-10-CM | POA: Diagnosis not present

## 2014-02-23 DIAGNOSIS — S0120XA Unspecified open wound of nose, initial encounter: Secondary | ICD-10-CM | POA: Diagnosis not present

## 2014-02-23 DIAGNOSIS — J329 Chronic sinusitis, unspecified: Secondary | ICD-10-CM | POA: Diagnosis not present

## 2014-02-23 DIAGNOSIS — J3489 Other specified disorders of nose and nasal sinuses: Secondary | ICD-10-CM | POA: Diagnosis not present

## 2014-03-01 DIAGNOSIS — S0120XA Unspecified open wound of nose, initial encounter: Secondary | ICD-10-CM | POA: Diagnosis not present

## 2014-03-01 DIAGNOSIS — C44319 Basal cell carcinoma of skin of other parts of face: Secondary | ICD-10-CM | POA: Diagnosis not present

## 2014-03-01 DIAGNOSIS — J329 Chronic sinusitis, unspecified: Secondary | ICD-10-CM | POA: Diagnosis not present

## 2014-03-01 DIAGNOSIS — J3489 Other specified disorders of nose and nasal sinuses: Secondary | ICD-10-CM | POA: Diagnosis not present

## 2014-03-04 DIAGNOSIS — J309 Allergic rhinitis, unspecified: Secondary | ICD-10-CM | POA: Diagnosis not present

## 2014-03-10 ENCOUNTER — Telehealth: Payer: Self-pay | Admitting: Neurology

## 2014-03-10 DIAGNOSIS — D353 Benign neoplasm of craniopharyngeal duct: Secondary | ICD-10-CM | POA: Diagnosis not present

## 2014-03-10 DIAGNOSIS — D352 Benign neoplasm of pituitary gland: Secondary | ICD-10-CM | POA: Diagnosis not present

## 2014-03-10 DIAGNOSIS — E230 Hypopituitarism: Secondary | ICD-10-CM | POA: Diagnosis not present

## 2014-03-10 DIAGNOSIS — R7301 Impaired fasting glucose: Secondary | ICD-10-CM | POA: Diagnosis not present

## 2014-03-10 DIAGNOSIS — Z1331 Encounter for screening for depression: Secondary | ICD-10-CM | POA: Diagnosis not present

## 2014-03-10 DIAGNOSIS — I2510 Atherosclerotic heart disease of native coronary artery without angina pectoris: Secondary | ICD-10-CM | POA: Diagnosis not present

## 2014-03-10 DIAGNOSIS — E039 Hypothyroidism, unspecified: Secondary | ICD-10-CM | POA: Diagnosis not present

## 2014-03-10 NOTE — Telephone Encounter (Signed)
Patient requesting MRI results.  Please return call

## 2014-03-11 NOTE — Telephone Encounter (Signed)
Spoke to patient and relayed MRI brain results, per [name].  See result notes in Imaging.

## 2014-03-15 DIAGNOSIS — J309 Allergic rhinitis, unspecified: Secondary | ICD-10-CM | POA: Diagnosis not present

## 2014-03-22 DIAGNOSIS — Z961 Presence of intraocular lens: Secondary | ICD-10-CM | POA: Diagnosis not present

## 2014-03-22 DIAGNOSIS — H4720 Unspecified optic atrophy: Secondary | ICD-10-CM | POA: Diagnosis not present

## 2014-03-22 DIAGNOSIS — H5340 Unspecified visual field defects: Secondary | ICD-10-CM | POA: Diagnosis not present

## 2014-03-22 DIAGNOSIS — H43819 Vitreous degeneration, unspecified eye: Secondary | ICD-10-CM | POA: Diagnosis not present

## 2014-03-25 DIAGNOSIS — J309 Allergic rhinitis, unspecified: Secondary | ICD-10-CM | POA: Diagnosis not present

## 2014-04-01 DIAGNOSIS — E291 Testicular hypofunction: Secondary | ICD-10-CM | POA: Diagnosis not present

## 2014-04-05 DIAGNOSIS — C4490 Unspecified malignant neoplasm of skin, unspecified: Secondary | ICD-10-CM | POA: Diagnosis not present

## 2014-04-05 DIAGNOSIS — L989 Disorder of the skin and subcutaneous tissue, unspecified: Secondary | ICD-10-CM | POA: Insufficient documentation

## 2014-04-05 DIAGNOSIS — Q849 Congenital malformation of integument, unspecified: Secondary | ICD-10-CM | POA: Diagnosis not present

## 2014-04-06 DIAGNOSIS — J309 Allergic rhinitis, unspecified: Secondary | ICD-10-CM | POA: Diagnosis not present

## 2014-04-16 DIAGNOSIS — J309 Allergic rhinitis, unspecified: Secondary | ICD-10-CM | POA: Diagnosis not present

## 2014-04-20 DIAGNOSIS — E291 Testicular hypofunction: Secondary | ICD-10-CM | POA: Diagnosis not present

## 2014-04-21 DIAGNOSIS — J309 Allergic rhinitis, unspecified: Secondary | ICD-10-CM | POA: Diagnosis not present

## 2014-04-22 DIAGNOSIS — J309 Allergic rhinitis, unspecified: Secondary | ICD-10-CM | POA: Diagnosis not present

## 2014-04-26 DIAGNOSIS — J309 Allergic rhinitis, unspecified: Secondary | ICD-10-CM | POA: Diagnosis not present

## 2014-05-03 DIAGNOSIS — J309 Allergic rhinitis, unspecified: Secondary | ICD-10-CM | POA: Diagnosis not present

## 2014-05-10 DIAGNOSIS — J309 Allergic rhinitis, unspecified: Secondary | ICD-10-CM | POA: Diagnosis not present

## 2014-05-11 DIAGNOSIS — E291 Testicular hypofunction: Secondary | ICD-10-CM | POA: Diagnosis not present

## 2014-05-12 DIAGNOSIS — H5340 Unspecified visual field defects: Secondary | ICD-10-CM | POA: Diagnosis not present

## 2014-05-12 DIAGNOSIS — H4720 Unspecified optic atrophy: Secondary | ICD-10-CM | POA: Diagnosis not present

## 2014-05-14 DIAGNOSIS — M950 Acquired deformity of nose: Secondary | ICD-10-CM | POA: Diagnosis not present

## 2014-05-14 DIAGNOSIS — Z85828 Personal history of other malignant neoplasm of skin: Secondary | ICD-10-CM | POA: Diagnosis not present

## 2014-05-17 DIAGNOSIS — J309 Allergic rhinitis, unspecified: Secondary | ICD-10-CM | POA: Diagnosis not present

## 2014-05-19 DIAGNOSIS — J309 Allergic rhinitis, unspecified: Secondary | ICD-10-CM | POA: Diagnosis not present

## 2014-05-19 DIAGNOSIS — J301 Allergic rhinitis due to pollen: Secondary | ICD-10-CM | POA: Diagnosis not present

## 2014-05-19 DIAGNOSIS — J3089 Other allergic rhinitis: Secondary | ICD-10-CM | POA: Diagnosis not present

## 2014-05-19 DIAGNOSIS — J3081 Allergic rhinitis due to animal (cat) (dog) hair and dander: Secondary | ICD-10-CM | POA: Diagnosis not present

## 2014-05-24 DIAGNOSIS — J309 Allergic rhinitis, unspecified: Secondary | ICD-10-CM | POA: Diagnosis not present

## 2014-06-01 DIAGNOSIS — E291 Testicular hypofunction: Secondary | ICD-10-CM | POA: Diagnosis not present

## 2014-06-01 DIAGNOSIS — J309 Allergic rhinitis, unspecified: Secondary | ICD-10-CM | POA: Diagnosis not present

## 2014-06-08 DIAGNOSIS — J309 Allergic rhinitis, unspecified: Secondary | ICD-10-CM | POA: Diagnosis not present

## 2014-06-10 ENCOUNTER — Ambulatory Visit: Payer: Medicare Other | Admitting: Cardiology

## 2014-06-15 DIAGNOSIS — J309 Allergic rhinitis, unspecified: Secondary | ICD-10-CM | POA: Diagnosis not present

## 2014-06-22 DIAGNOSIS — E291 Testicular hypofunction: Secondary | ICD-10-CM | POA: Diagnosis not present

## 2014-06-23 DIAGNOSIS — J309 Allergic rhinitis, unspecified: Secondary | ICD-10-CM | POA: Diagnosis not present

## 2014-06-28 DIAGNOSIS — J309 Allergic rhinitis, unspecified: Secondary | ICD-10-CM | POA: Diagnosis not present

## 2014-07-06 DIAGNOSIS — J3089 Other allergic rhinitis: Secondary | ICD-10-CM | POA: Diagnosis not present

## 2014-07-06 DIAGNOSIS — J3081 Allergic rhinitis due to animal (cat) (dog) hair and dander: Secondary | ICD-10-CM | POA: Diagnosis not present

## 2014-07-06 DIAGNOSIS — J301 Allergic rhinitis due to pollen: Secondary | ICD-10-CM | POA: Diagnosis not present

## 2014-07-09 ENCOUNTER — Encounter: Payer: Self-pay | Admitting: *Deleted

## 2014-07-12 DIAGNOSIS — E785 Hyperlipidemia, unspecified: Secondary | ICD-10-CM | POA: Diagnosis not present

## 2014-07-12 DIAGNOSIS — N529 Male erectile dysfunction, unspecified: Secondary | ICD-10-CM | POA: Diagnosis not present

## 2014-07-12 DIAGNOSIS — R7301 Impaired fasting glucose: Secondary | ICD-10-CM | POA: Diagnosis not present

## 2014-07-12 DIAGNOSIS — E039 Hypothyroidism, unspecified: Secondary | ICD-10-CM | POA: Diagnosis not present

## 2014-07-12 DIAGNOSIS — Z23 Encounter for immunization: Secondary | ICD-10-CM | POA: Diagnosis not present

## 2014-07-12 DIAGNOSIS — I2510 Atherosclerotic heart disease of native coronary artery without angina pectoris: Secondary | ICD-10-CM | POA: Diagnosis not present

## 2014-07-12 DIAGNOSIS — Z6830 Body mass index (BMI) 30.0-30.9, adult: Secondary | ICD-10-CM | POA: Diagnosis not present

## 2014-07-12 DIAGNOSIS — E291 Testicular hypofunction: Secondary | ICD-10-CM | POA: Diagnosis not present

## 2014-07-15 ENCOUNTER — Ambulatory Visit (INDEPENDENT_AMBULATORY_CARE_PROVIDER_SITE_OTHER): Payer: Medicare Other | Admitting: Cardiology

## 2014-07-15 ENCOUNTER — Encounter: Payer: Self-pay | Admitting: Cardiology

## 2014-07-15 VITALS — BP 122/64 | HR 68 | Ht 74.0 in | Wt 241.0 lb

## 2014-07-15 DIAGNOSIS — E7800 Pure hypercholesterolemia, unspecified: Secondary | ICD-10-CM

## 2014-07-15 DIAGNOSIS — J329 Chronic sinusitis, unspecified: Secondary | ICD-10-CM | POA: Insufficient documentation

## 2014-07-15 DIAGNOSIS — C4491 Basal cell carcinoma of skin, unspecified: Secondary | ICD-10-CM | POA: Insufficient documentation

## 2014-07-15 DIAGNOSIS — I428 Other cardiomyopathies: Secondary | ICD-10-CM

## 2014-07-15 DIAGNOSIS — I429 Cardiomyopathy, unspecified: Secondary | ICD-10-CM

## 2014-07-15 DIAGNOSIS — I491 Atrial premature depolarization: Secondary | ICD-10-CM | POA: Diagnosis not present

## 2014-07-15 DIAGNOSIS — I2510 Atherosclerotic heart disease of native coronary artery without angina pectoris: Secondary | ICD-10-CM | POA: Diagnosis not present

## 2014-07-15 DIAGNOSIS — G479 Sleep disorder, unspecified: Secondary | ICD-10-CM | POA: Insufficient documentation

## 2014-07-15 DIAGNOSIS — J399 Disease of upper respiratory tract, unspecified: Secondary | ICD-10-CM | POA: Insufficient documentation

## 2014-07-15 DIAGNOSIS — S0120XA Unspecified open wound of nose, initial encounter: Secondary | ICD-10-CM | POA: Insufficient documentation

## 2014-07-16 MED ORDER — CARVEDILOL 3.125 MG PO TABS: ORAL_TABLET | ORAL | Status: DC

## 2014-07-16 NOTE — Patient Instructions (Signed)
Decrease coreg (carvedilol) to 1/2 of a 3.125mg  tablet two times a day.  Your physician wants you to follow-up in: 6 months with Dr Aundra Dubin. (You will receive a reminder letter in the mail two months in advance. If you don't receive a letter, please call our office to schedule the follow-up appointment.

## 2014-07-16 NOTE — Progress Notes (Signed)
Patient ID: [name], male   DOB: 1933/10/16, 80 y.o.   MRN: [id number] PCP: [name]  80 yo with history of pituitary adenoma s/p resection, nonobstructive CAD, nonischemic cardiomyopathy, and chronic atrial bigeminy presents for cardiology followup.  Patient has a long history of atrial bigeminy.  Unlike prior appointments, he is in NSR today with only 1 PAC on the ECG.  He has a nonischemic cardiomyopathy with EF 40% by last echo.  Prior cardiac cath showed nonobstructive disease.  Symptomatically, he does well. No exertional dyspnea or chest pain.  He hunts a lot and has no problems walking in the woods up and down hills.  He has not been able to tolerate statins, he has been tried on multiple agents. He is only taking Coreg 3.125 mg in the morning.   ECG: NSR with one PAC.   Labs (5/12): K 4.3, creatinine 1.03 Labs (2/15): K 4, creatinine 1.1, LDL 184  PMH: 1. Hyperlipidemia: He has been unable to tolerate statins.  2. Chronic atrial bigeminy: x years.  3. Hypothyroidism: s/p surgery for pituitary adenoma.  4. Pituitary adenoma: s/p surgery in 3/11 for removal.  Now on thyroid replacement and hydrocortisone.  5. Cardiomyopathy: Nonischemic.  LHC (2009) with 50% mid LAD, 60% distal LAD.  Left ventriculogram (2009) with EF 40-45%.  Echo (8/13) with EF 35-40%, mild LVH.  Echo (11/14) with EF 40%, diffuse hypokinesis, normal RV.  He was tried in the past on an ACEI but apparently at that time began to have side effects from his pituitary adenoma that were thought initially to be ACEI-related.   He has been off ACEI since that time.  6. CAD: Nonobstructive.  2009 LHC with 50% mid, 60% distal LAD.  7. Mild cognitive impairment.   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   FH: Brother with CAD.  ROS: All systems reviewed and negative except as per HPI.   Current Outpatient Prescriptions  Medication Sig Dispense Refill  . aspirin 81 MG tablet Take 81 mg by  mouth daily.        . Cinnamon 500 MG capsule Take 500 mg by mouth daily.        . Coenzyme Q10 (COQ10) 200 MG CAPS Take 1 capsule by mouth daily.        . ergocalciferol (VITAMIN D2) 50000 UNITS capsule Take 50,000 Units by mouth once a week.        . fenofibrate 160 MG tablet Take 1 tablet by mouth Daily.      . finasteride (PROSCAR) 5 MG tablet Take 5 mg by mouth daily.        . Glucosamine Sulfate 1000 MG CAPS Take 1 capsule by mouth daily.        . hydrocortisone (CORTEF) 10 MG tablet One and on half tablet daily      . levothyroxine (SYNTHROID, LEVOTHROID) 150 MCG tablet Take 150 mcg by mouth daily before breakfast.      . Multiple Vitamins-Minerals (MEGA MULTI MEN PO) Take 1 tablet by mouth daily.        Marland Kitchen omega-3 acid ethyl esters (LOVAZA) 1 G capsule Take by mouth. Take 4 caps daily       . Solifenacin Succinate (VESICARE PO) Take by mouth. TAKE ONE TABLET DAILY AS NEEDED      . testosterone cypionate (DEPOTESTOTERONE CYPIONATE) 200 MG/ML injection Inject into the muscle every 14 (fourteen) days. 1.62 % injection       . carvedilol (  COREG) 3.125 MG tablet 1/2 tablet two times a day  30 tablet  6   No current facility-administered medications for this visit.    BP 122/64  Pulse 68  Ht 6\' 2"  (1.88 m)  Wt 241 lb (109.317 kg)  BMI 30.93 kg/m2 General: NAD Neck: No JVD, no thyromegaly or thyroid nodule.  Lungs: Clear to auscultation bilaterally with normal respiratory effort. CV: Nondisplaced PMI.  Heart regular S1/S2, no S3/S4, no murmur.  1+ ankle edema.  No carotid bruit.  Normal pedal pulses.  Abdomen: Soft, nontender, no hepatosplenomegaly, no distention.  Skin: Intact without lesions or rashes.  Neurologic: Alert and oriented x 3.  Psych: Normal affect. Extremities: No clubbing or cyanosis.   Assessment/Plan: 1. Nonischemic cardiomyopathy: LHC (2009) with nonobstructive disease.  EF 40% with diffuse hypokinesis on last echo.  He is euvolemic with NYHA class I symptoms. -  Frequent PVCs can cause cardiomyopathy.  This patient, however, has a history of having many PACs. Have discussed with EP in the past, plan medical treatment for now and follow echo (set up for repeat echo at next visit).   - He thinks that he can tolerate Coreg 3.125 mg bid rather than qd, so I would like him to go ahead and start doing this. - Would eventually like to restart an ACEI as I think that the symptoms that the patient had prior while on ACEI were actually due to his pituitary adenoma.  However, he may not be able to tolerate it in terms of lightheadedness/orthostatic symptoms.   2. CAD: Nonobstructive moderate CAD on LHC in 2009.  He has not tolerated statins.  Will continue ASA 81.  3. Hyperlipidemia: He has not tolerated multiple statins.  I think that he would be a good candidate for Praluent or Repatha.  I am going to refer him to our pharmacy clinic to initiate.    Followup in 6 months  [name] 07/16/2014

## 2014-07-19 DIAGNOSIS — J301 Allergic rhinitis due to pollen: Secondary | ICD-10-CM | POA: Diagnosis not present

## 2014-07-19 DIAGNOSIS — J3089 Other allergic rhinitis: Secondary | ICD-10-CM | POA: Diagnosis not present

## 2014-07-19 DIAGNOSIS — J3081 Allergic rhinitis due to animal (cat) (dog) hair and dander: Secondary | ICD-10-CM | POA: Diagnosis not present

## 2014-07-26 DIAGNOSIS — J3089 Other allergic rhinitis: Secondary | ICD-10-CM | POA: Diagnosis not present

## 2014-07-26 DIAGNOSIS — Z85828 Personal history of other malignant neoplasm of skin: Secondary | ICD-10-CM | POA: Diagnosis not present

## 2014-07-26 DIAGNOSIS — L821 Other seborrheic keratosis: Secondary | ICD-10-CM | POA: Diagnosis not present

## 2014-07-26 DIAGNOSIS — J301 Allergic rhinitis due to pollen: Secondary | ICD-10-CM | POA: Diagnosis not present

## 2014-07-26 DIAGNOSIS — L570 Actinic keratosis: Secondary | ICD-10-CM | POA: Diagnosis not present

## 2014-07-26 DIAGNOSIS — J3081 Allergic rhinitis due to animal (cat) (dog) hair and dander: Secondary | ICD-10-CM | POA: Diagnosis not present

## 2014-07-26 DIAGNOSIS — Z85820 Personal history of malignant melanoma of skin: Secondary | ICD-10-CM | POA: Diagnosis not present

## 2014-07-28 ENCOUNTER — Encounter: Payer: Self-pay | Admitting: Nurse Practitioner

## 2014-07-28 ENCOUNTER — Ambulatory Visit (INDEPENDENT_AMBULATORY_CARE_PROVIDER_SITE_OTHER): Payer: Medicare Other | Admitting: Nurse Practitioner

## 2014-07-28 VITALS — BP 126/80 | HR 64 | Ht 74.0 in | Wt 239.6 lb

## 2014-07-28 DIAGNOSIS — G3184 Mild cognitive impairment, so stated: Secondary | ICD-10-CM

## 2014-07-28 DIAGNOSIS — D352 Benign neoplasm of pituitary gland: Secondary | ICD-10-CM | POA: Diagnosis not present

## 2014-07-28 DIAGNOSIS — I2510 Atherosclerotic heart disease of native coronary artery without angina pectoris: Secondary | ICD-10-CM

## 2014-07-28 NOTE — Patient Instructions (Addendum)
I think overall you are doing fairly well but I do want to suggest a few things today:  Remember to drink plenty of fluid, eat healthy meals and do not skip any meals. Try to eat protein with a every meal and eat a healthy snack such as fruit or nuts in between meals. Try to keep a regular sleep-wake schedule and try to exercise daily, particularly in the form of walking, 20-30 minutes a day, if you can. Good nutrition, proper sleep and exercise can help her cognitive function.  Engage in social activities in your community and with your family and try to keep up with current events by reading the newspaper or watching the news. If you have computer and can go online, try http://cook-fox.com/. Also, you may like to do word finding puzzles or crossword puzzles.  As far as your medications are concerned, I would like to suggest nothing new at this time.  As far as diagnostic testing: none needed at this time.  [name] would like to see you back in 6 months, sooner if we need to. Please call us with any interim questions, concerns, problems, updates or refill requests.  Please also call us for any test results so we can go over those with you on the phone.  My clinical assistant will answer any of your questions and relay your messages to me and also relay most of my messages to you.  Our phone number is [phone]. We also have an after hours call service for urgent matters and there is a physician on-call for urgent questions. For any emergencies you know to call 911 or go to the nearest emergency room.
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
## 2014-07-29 NOTE — Progress Notes (Signed)
I agree with the assessment and plan as directed by NP .The patient is known to me .   Shahil Speegle, MD  

## 2014-08-02 DIAGNOSIS — J3089 Other allergic rhinitis: Secondary | ICD-10-CM | POA: Diagnosis not present

## 2014-08-02 DIAGNOSIS — J3081 Allergic rhinitis due to animal (cat) (dog) hair and dander: Secondary | ICD-10-CM | POA: Diagnosis not present

## 2014-08-02 DIAGNOSIS — J301 Allergic rhinitis due to pollen: Secondary | ICD-10-CM | POA: Diagnosis not present

## 2014-08-03 DIAGNOSIS — E291 Testicular hypofunction: Secondary | ICD-10-CM | POA: Diagnosis not present

## 2014-08-10 DIAGNOSIS — J3089 Other allergic rhinitis: Secondary | ICD-10-CM | POA: Diagnosis not present

## 2014-08-10 DIAGNOSIS — J3081 Allergic rhinitis due to animal (cat) (dog) hair and dander: Secondary | ICD-10-CM | POA: Diagnosis not present

## 2014-08-10 DIAGNOSIS — J301 Allergic rhinitis due to pollen: Secondary | ICD-10-CM | POA: Diagnosis not present

## 2014-08-13 DIAGNOSIS — S91331A Puncture wound without foreign body, right foot, initial encounter: Secondary | ICD-10-CM | POA: Diagnosis not present

## 2014-08-13 DIAGNOSIS — W228XXA Striking against or struck by other objects, initial encounter: Secondary | ICD-10-CM | POA: Diagnosis not present

## 2014-08-13 DIAGNOSIS — Z23 Encounter for immunization: Secondary | ICD-10-CM | POA: Diagnosis not present

## 2014-08-13 DIAGNOSIS — Y92821 Forest as the place of occurrence of the external cause: Secondary | ICD-10-CM | POA: Diagnosis not present

## 2014-08-20 DIAGNOSIS — J301 Allergic rhinitis due to pollen: Secondary | ICD-10-CM | POA: Diagnosis not present

## 2014-08-20 DIAGNOSIS — J3081 Allergic rhinitis due to animal (cat) (dog) hair and dander: Secondary | ICD-10-CM | POA: Diagnosis not present

## 2014-08-20 DIAGNOSIS — J3089 Other allergic rhinitis: Secondary | ICD-10-CM | POA: Diagnosis not present

## 2014-08-23 DIAGNOSIS — L578 Other skin changes due to chronic exposure to nonionizing radiation: Secondary | ICD-10-CM | POA: Diagnosis not present

## 2014-08-23 DIAGNOSIS — Z85828 Personal history of other malignant neoplasm of skin: Secondary | ICD-10-CM | POA: Diagnosis not present

## 2014-08-25 ENCOUNTER — Telehealth: Payer: Self-pay | Admitting: Cardiology

## 2014-08-25 ENCOUNTER — Inpatient Hospital Stay (HOSPITAL_COMMUNITY): Payer: Medicare Other

## 2014-08-25 ENCOUNTER — Inpatient Hospital Stay (HOSPITAL_COMMUNITY)
Admission: EM | Admit: 2014-08-25 | Discharge: 2014-08-27 | DRG: 247 | Disposition: A | Discharge status: Home Health | Payer: Medicare Other | Attending: Cardiovascular Disease | Admitting: Cardiovascular Disease

## 2014-08-25 ENCOUNTER — Emergency Department (HOSPITAL_COMMUNITY): Payer: Medicare Other

## 2014-08-25 ENCOUNTER — Encounter (HOSPITAL_COMMUNITY): Payer: Self-pay | Admitting: Emergency Medicine

## 2014-08-25 ENCOUNTER — Encounter (HOSPITAL_COMMUNITY): Admission: EM | Disposition: A | Discharge status: Home Health | Payer: Medicare Other | Source: Home / Self Care

## 2014-08-25 DIAGNOSIS — I517 Cardiomegaly: Secondary | ICD-10-CM | POA: Diagnosis not present

## 2014-08-25 DIAGNOSIS — I2121 ST elevation (STEMI) myocardial infarction involving left circumflex coronary artery: Secondary | ICD-10-CM

## 2014-08-25 DIAGNOSIS — Z8249 Family history of ischemic heart disease and other diseases of the circulatory system: Secondary | ICD-10-CM

## 2014-08-25 DIAGNOSIS — I428 Other cardiomyopathies: Secondary | ICD-10-CM | POA: Diagnosis present

## 2014-08-25 DIAGNOSIS — E785 Hyperlipidemia, unspecified: Secondary | ICD-10-CM | POA: Diagnosis present

## 2014-08-25 DIAGNOSIS — Z7982 Long term (current) use of aspirin: Secondary | ICD-10-CM

## 2014-08-25 DIAGNOSIS — I2582 Chronic total occlusion of coronary artery: Secondary | ICD-10-CM | POA: Diagnosis present

## 2014-08-25 DIAGNOSIS — I420 Dilated cardiomyopathy: Secondary | ICD-10-CM | POA: Diagnosis not present

## 2014-08-25 DIAGNOSIS — I2510 Atherosclerotic heart disease of native coronary artery without angina pectoris: Secondary | ICD-10-CM | POA: Diagnosis present

## 2014-08-25 DIAGNOSIS — F40240 Claustrophobia: Secondary | ICD-10-CM | POA: Diagnosis present

## 2014-08-25 DIAGNOSIS — N400 Enlarged prostate without lower urinary tract symptoms: Secondary | ICD-10-CM | POA: Diagnosis present

## 2014-08-25 DIAGNOSIS — R079 Chest pain, unspecified: Secondary | ICD-10-CM | POA: Diagnosis not present

## 2014-08-25 DIAGNOSIS — Z79899 Other long term (current) drug therapy: Secondary | ICD-10-CM | POA: Diagnosis not present

## 2014-08-25 DIAGNOSIS — I2129 ST elevation (STEMI) myocardial infarction involving other sites: Secondary | ICD-10-CM | POA: Diagnosis not present

## 2014-08-25 DIAGNOSIS — I214 Non-ST elevation (NSTEMI) myocardial infarction: Principal | ICD-10-CM

## 2014-08-25 DIAGNOSIS — Z882 Allergy status to sulfonamides status: Secondary | ICD-10-CM

## 2014-08-25 DIAGNOSIS — I10 Essential (primary) hypertension: Secondary | ICD-10-CM | POA: Diagnosis not present

## 2014-08-25 DIAGNOSIS — E291 Testicular hypofunction: Secondary | ICD-10-CM | POA: Diagnosis not present

## 2014-08-25 HISTORY — PX: LEFT HEART CATHETERIZATION WITH CORONARY ANGIOGRAM: SHX5451

## 2014-08-25 HISTORY — DX: Non-ST elevation (NSTEMI) myocardial infarction: I21.4

## 2014-08-25 LAB — I-STAT TROPONIN, ED: TROPONIN I, POC: 0.21 ng/mL — AB (ref 0.00–0.08)

## 2014-08-25 LAB — CBC
HEMATOCRIT: 47.3 % (ref 39.0–52.0)
Hemoglobin: 16.1 g/dL (ref 13.0–17.0)
MCH: 30.6 pg (ref 26.0–34.0)
MCHC: 34 g/dL (ref 30.0–36.0)
MCV: 89.9 fL (ref 78.0–100.0)
PLATELETS: 226 10*3/uL (ref 150–400)
RBC: 5.26 MIL/uL (ref 4.22–5.81)
RDW: 13.2 % (ref 11.5–15.5)
WBC: 7.3 10*3/uL (ref 4.0–10.5)

## 2014-08-25 LAB — I-STAT CHEM 8, ED
BUN: 25 mg/dL — ABNORMAL HIGH (ref 6–23)
CHLORIDE: 100 meq/L (ref 96–112)
Calcium, Ion: 1.19 mmol/L (ref 1.13–1.30)
Creatinine, Ser: 1.2 mg/dL (ref 0.50–1.35)
GLUCOSE: 101 mg/dL — AB (ref 70–99)
HCT: 52 % (ref 39.0–52.0)
Hemoglobin: 17.7 g/dL — ABNORMAL HIGH (ref 13.0–17.0)
POTASSIUM: 4.5 meq/L (ref 3.7–5.3)
Sodium: 139 mEq/L (ref 137–147)
TCO2: 23 mmol/L (ref 0–100)

## 2014-08-25 LAB — TROPONIN I

## 2014-08-25 LAB — MRSA PCR SCREENING: MRSA by PCR: NEGATIVE

## 2014-08-25 SURGERY — LEFT HEART CATHETERIZATION WITH CORONARY ANGIOGRAM
Anesthesia: LOCAL

## 2014-08-25 MED ORDER — TICAGRELOR 90 MG PO TABS
90.0000 mg | ORAL_TABLET | Freq: Two times a day (BID) | ORAL | Status: DC
  Administered 2014-08-26 – 2014-08-27 (×3): 90 mg via ORAL
  Filled 2014-08-25 (×4): qty 1

## 2014-08-25 MED ORDER — FINASTERIDE 5 MG PO TABS
5.0000 mg | ORAL_TABLET | Freq: Every day | ORAL | Status: DC
  Administered 2014-08-26 – 2014-08-27 (×2): 5 mg via ORAL
  Filled 2014-08-25 (×2): qty 1

## 2014-08-25 MED ORDER — CARVEDILOL 3.125 MG PO TABS
3.1250 mg | ORAL_TABLET | Freq: Two times a day (BID) | ORAL | Status: DC
  Administered 2014-08-25 – 2014-08-27 (×4): 3.125 mg via ORAL
  Filled 2014-08-25 (×6): qty 1

## 2014-08-25 MED ORDER — MIDAZOLAM HCL 2 MG/2ML IJ SOLN
INTRAMUSCULAR | Status: AC
  Filled 2014-08-25: qty 2

## 2014-08-25 MED ORDER — SOLIFENACIN SUCCINATE 5 MG PO TABS
10.0000 mg | ORAL_TABLET | ORAL | Status: DC
Start: 2014-08-26 — End: 2014-08-25
  Filled 2014-08-25: qty 2

## 2014-08-25 MED ORDER — SODIUM CHLORIDE 0.9 % IV SOLN
INTRAVENOUS | Status: DC
  Administered 2014-08-25: 19:00:00 via INTRAVENOUS

## 2014-08-25 MED ORDER — NITROGLYCERIN 0.4 MG SL SUBL
0.4000 mg | SUBLINGUAL_TABLET | SUBLINGUAL | Status: DC | PRN
  Administered 2014-08-25 (×2): 0.4 mg via SUBLINGUAL
  Filled 2014-08-25: qty 1

## 2014-08-25 MED ORDER — SOLIFENACIN SUCCINATE 5 MG PO TABS
10.0000 mg | ORAL_TABLET | ORAL | Status: DC
  Administered 2014-08-25 – 2014-08-27 (×2): 10 mg via ORAL
  Filled 2014-08-25 (×2): qty 2

## 2014-08-25 MED ORDER — HEPARIN SODIUM (PORCINE) 1000 UNIT/ML IJ SOLN
INTRAMUSCULAR | Status: AC
  Filled 2014-08-25: qty 1

## 2014-08-25 MED ORDER — ONDANSETRON HCL 4 MG/2ML IJ SOLN: 4.0000 mg | Freq: Four times a day (QID) | INTRAMUSCULAR | Status: DC | PRN

## 2014-08-25 MED ORDER — LIDOCAINE HCL (PF) 1 % IJ SOLN
INTRAMUSCULAR | Status: AC
  Filled 2014-08-25: qty 30

## 2014-08-25 MED ORDER — HYDROCORTISONE 5 MG PO TABS
15.0000 mg | ORAL_TABLET | Freq: Every day | ORAL | Status: DC
  Administered 2014-08-26 – 2014-08-27 (×2): 15 mg via ORAL
  Filled 2014-08-25 (×3): qty 1

## 2014-08-25 MED ORDER — SOLIFENACIN SUCCINATE 5 MG PO TABS: 5.0000 mg | ORAL_TABLET | ORAL | Status: DC

## 2014-08-25 MED ORDER — NITROGLYCERIN 1 MG/10 ML FOR IR/CATH LAB
INTRA_ARTERIAL | Status: AC
  Filled 2014-08-25: qty 10

## 2014-08-25 MED ORDER — DARIFENACIN HYDROBROMIDE ER 7.5 MG PO TB24: 7.5000 mg | ORAL_TABLET | ORAL | Status: DC

## 2014-08-25 MED ORDER — HEPARIN (PORCINE) IN NACL 2-0.9 UNIT/ML-% IJ SOLN
INTRAMUSCULAR | Status: AC
  Filled 2014-08-25: qty 1000

## 2014-08-25 MED ORDER — BIVALIRUDIN 250 MG IV SOLR
INTRAVENOUS | Status: AC
  Filled 2014-08-25: qty 250

## 2014-08-25 MED ORDER — ASPIRIN 81 MG PO CHEW
81.0000 mg | CHEWABLE_TABLET | Freq: Every day | ORAL | Status: DC
  Administered 2014-08-26 – 2014-08-27 (×2): 81 mg via ORAL
  Filled 2014-08-25 (×2): qty 1

## 2014-08-25 MED ORDER — FAMOTIDINE IN NACL 20-0.9 MG/50ML-% IV SOLN
20.0000 mg | Freq: Once | INTRAVENOUS | Status: AC
  Administered 2014-08-25: 20 mg via INTRAVENOUS
  Filled 2014-08-25: qty 50

## 2014-08-25 MED ORDER — FENTANYL CITRATE 0.05 MG/ML IJ SOLN
INTRAMUSCULAR | Status: AC
  Filled 2014-08-25: qty 2

## 2014-08-25 MED ORDER — SODIUM CHLORIDE 0.9 % IV SOLN
0.2500 mg/kg/h | INTRAVENOUS | Status: AC
Start: 2014-08-25 — End: 2014-08-25
  Filled 2014-08-25: qty 250

## 2014-08-25 MED ORDER — LEVOTHYROXINE SODIUM 150 MCG PO TABS
150.0000 ug | ORAL_TABLET | Freq: Every day | ORAL | Status: DC
  Administered 2014-08-26 – 2014-08-27 (×2): 150 ug via ORAL
  Filled 2014-08-25 (×5): qty 1

## 2014-08-25 MED ORDER — ACETAMINOPHEN 325 MG PO TABS
650.0000 mg | ORAL_TABLET | ORAL | Status: DC | PRN
  Administered 2014-08-26: 650 mg via ORAL
  Filled 2014-08-25: qty 2

## 2014-08-25 MED ORDER — TICAGRELOR 90 MG PO TABS
ORAL_TABLET | ORAL | Status: AC
  Filled 2014-08-25: qty 2

## 2014-08-25 MED ORDER — ROSUVASTATIN CALCIUM 40 MG PO TABS
40.0000 mg | ORAL_TABLET | Freq: Every day | ORAL | Status: DC
  Filled 2014-08-25 (×3): qty 1

## 2014-08-25 MED ORDER — VERAPAMIL HCL 2.5 MG/ML IV SOLN
INTRAVENOUS | Status: AC
  Filled 2014-08-25: qty 2

## 2014-08-25 NOTE — Telephone Encounter (Signed)
Calling stating he has been having chest pain upper mid chest into (R) and (L) arm for about 1-2 hrs.  States not getting any better. Rates pain on scale on about 5-7.  States he has sl SOB.  Advised to go to [facility] ER or call 911.  States his wife is with him so will have her drive him to ER. Notified Trish of pt going to ER.

## 2014-08-25 NOTE — H&P (Signed)
History and Physical  Patient ID: [name] MRN: [id number], SOB: November 27, 1933 80 y.o. Date of Encounter: 08/25/2014, 5:15 PM  Primary Physician: Jerlyn Ly, MD Primary Cardiologist: Dr Aundra Dubin  Chief Complaint: Chest pain  HPI: 80 y.o. male w/ PMHx significant for nonischemic cardiomyopathy who presented to [facility] on 08/25/2014 with complaints of chest pain. The patient was in his normal state of health until 2:30 PM this afternoon. At that time he developed substernal chest pressure radiating to his right arm. He states that "I just felt bad." There is no associated shortness of breath, lightheadedness, or syncope. The patient has never had symptoms like this before. At the time of my evaluation, he describes 2/10 chest pain. I was called by the emergency department because the patient's initial point-of-care troponin is elevated and he has ST segment depression on EKG. I came down to evaluate him immediately. He otherwise reports no recent symptoms and specifically denies fevers/chills/cough. He denies heart palpitations, orthopnea, or PND.   Past Medical History  Diagnosis Date  . Hyperlipidemia   . BPH (benign prostatic hypertrophy)   . Non-ischemic cardiomyopathy   . Atrial premature beats   . Coronary artery disease      Surgical History:  Past Surgical History  Procedure Laterality Date  . Hand surgery Left   . Cataract extraction    . Back surgery    . Hammer  bunion toe surgery    . Pituitary surgery      12/01/09 Dr. Wilburn Cornelia and [name].  . Arthroscopic surgery       Home Meds: Prior to Admission medications   Medication Sig Start Date End Date Taking? Authorizing Provider  ALPRAZolam Duanne Moron) 0.5 MG tablet Take 0.5 mg by mouth.    Historical Provider, MD  aspirin 81 MG tablet Take 81 mg by mouth daily.      Historical Provider, MD  carvedilol (COREG) 3.125 MG tablet 1/2 tablet two times a day 07/16/14   [name], MD  cefdinir  (OMNICEF) 300 MG capsule  02/23/14   Historical Provider, MD  Cinnamon 500 MG capsule Take 500 mg by mouth daily.      Historical Provider, MD  Coenzyme Q10 (COQ10) 200 MG CAPS Take 1 capsule by mouth daily.      Historical Provider, MD  CRESTOR 5 MG tablet  07/15/14   Historical Provider, MD  diazepam (VALIUM) 5 MG tablet Take 5 mg by mouth.    Historical Provider, MD  ergocalciferol (VITAMIN D2) 50000 UNITS capsule Take 50,000 Units by mouth once a week.      Historical Provider, MD  fenofibrate 160 MG tablet Take 1 tablet by mouth Daily. 01/14/11   Historical Provider, MD  finasteride (PROSCAR) 5 MG tablet Take 5 mg by mouth daily.      Historical Provider, MD  Glucosamine Sulfate 1000 MG CAPS Take 1 capsule by mouth daily.      Historical Provider, MD  hydrocortisone (CORTEF) 10 MG tablet One and on half tablet daily 01/09/11   Historical Provider, MD  levothyroxine (SYNTHROID, LEVOTHROID) 150 MCG tablet Take 150 mcg by mouth daily before breakfast.    Historical Provider, MD  Multiple Vitamins-Minerals (MEGA MULTI MEN PO) Take 1 tablet by mouth daily.      Historical Provider, MD  omega-3 acid ethyl esters (LOVAZA) 1 G capsule Take by mouth. Take 4 caps daily     Historical Provider, MD  Solifenacin Succinate (VESICARE PO) Take  by mouth. TAKE ONE TABLET DAILY AS NEEDED    Historical Provider, MD  tadalafil (CIALIS) 5 MG tablet  05/02/14   Historical Provider, MD  testosterone cypionate (DEPOTESTOTERONE CYPIONATE) 200 MG/ML injection Inject into the muscle every 14 (fourteen) days. 1.62 % injection     Historical Provider, MD  traMADol Veatrice Bourbon) 50 MG tablet  01/21/14   Historical Provider, MD  [facility] 3.75 G PACK  07/14/14   Historical Provider, MD    Allergies:  Allergies  Allergen Reactions  . Sulfa Antibiotics Nausea And Vomiting  . Sulfonamide Derivatives   . Tamsulosin     History   Social History  . Marital Status: Married    Spouse Name: [name]    Number of Children: 2  .  Years of Education: College   Occupational History  . retired Clinical biochemist    Social History Main Topics  . Smoking status: Never Smoker   . Smokeless tobacco: Never Used  . Alcohol Use: Yes     Comment: occas.  . Drug Use: No  . Sexual Activity: Not on file   Other Topics Concern  . Not on file   Social History Narrative   Patient is married [name]).   Patient has two children.   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   Patient is right-handed.   Patient does not drink any caffeine.     Family History  Problem Relation Age of Onset  . Congestive Heart Failure Father 56  . Other Mother 11  . Coronary artery disease Brother   . Other Sister 76    polio    Review of Systems: General: negative for chills, fever, night sweats or weight changes.  ENT: negative for rhinorrhea or epistaxis Cardiovascular: See history of present illness  Dermatological: negative for rash Respiratory: negative for cough or wheezing GI: negative for nausea, vomiting, diarrhea, bright red blood per rectum, melena, or hematemesis GU: no hematuria, urgency, or frequency Neurologic: negative for visual changes, syncope, headache, or dizziness Heme: no easy bruising or bleeding Endo: negative for excessive thirst, thyroid disorder, or flushing Musculoskeletal: negative for joint pain or swelling, negative for myalgias All other systems reviewed and are otherwise negative except as noted above.  Physical Exam: Blood pressure 113/43, pulse 62, temperature 97.7 F (36.5 C), temperature source Oral, resp. rate 18, height 6\' 3"  (1.905 m), weight 235 lb (106.595 kg), SpO2 96 %. General: Well developed, well nourished, alert and oriented, in no acute distress, somewhat anxious. HEENT: Normocephalic, atraumatic, sclera non-icteric, no xanthomas, nares are without discharge.  Neck: Supple. Carotids 2+ without bruits. JVP normal Lungs: Clear bilaterally to auscultation without  wheezes, rales, or rhonchi. Breathing is unlabored. Heart: Irregular with normal S1 and S2. No murmurs, rubs, or gallops appreciated. Abdomen: Soft, non-tender, non-distended with normoactive bowel sounds. No hepatomegaly. No rebound/guarding. No obvious abdominal masses. Back: No CVA tenderness Msk:  Strength and tone appear normal for age. Extremities: No clubbing, cyanosis, or edema.  Distal pedal pulses are 2+ and equal bilaterally. Neuro: CNII-XII intact, moves all extremities spontaneously. Psych:  Responds to questions appropriately with a normal affect.   Labs:   Lab Results  Component Value Date   WBC 7.3 08/25/2014   HGB 17.7* 08/25/2014   HCT 52.0 08/25/2014   MCV 89.9 08/25/2014   PLT 226 08/25/2014    Recent Labs Lab 08/25/14 1626  NA 139  K 4.5  CL 100  BUN 25*  CREATININE 1.20  GLUCOSE 101*   No results for input(s): CKTOTAL, CKMB, TROPONINI in the last 72 hours. No results found for: CHOL, HDL, LDLCALC, TRIG No results found for: DDIMER  Radiology/Studies:  No results found.   EKG: Normal sinus rhythm with frequent PACs, marked ST/T-wave abnormality consider anterior ischemia versus posterior injury  ASSESSMENT AND PLAN:  1. Acute non-ST segment elevation MI with ongoing ischemic symptoms. The patient has ongoing mild chest pain, marked ST segment depression on EKG, and elevated troponin levels. I have recommended emergency cardiac catheterization and possible PCI. He has been given ASA 325 mg.   I have reviewed the risks, indications, and alternatives to cardiac catheterization and PCI were reviewed with the patient. Risks include but are not limited to bleeding, infection, vascular injury, stroke, myocardial infection, arrhythmia, kidney injury, radiation-related injury in the case of prolonged fluoroscopy use, emergency cardiac surgery, and death. The patient understands the risks of serious complication is low (<8%).   2. Nonischemic cardiomyopathy. Will  continue home medical therapy. He has not been able to take an ACE inhibitor in the past. He has been started on a low-dose of carvedilol.  3. Hyperlipidemia. The patient is statin intolerant. He apparently has been offered a PCXK9 inhibitor but declined.  Dispo: emergency cardiac cath +/- PCI, further plans pending cath results.  Signed, Sherren Mocha MD 08/25/2014, 5:15 PM

## 2014-08-25 NOTE — CV Procedure (Signed)
      Cardiac Catheterization Operative Report  [name] [id number] 11/25/20156:15 PM Jerlyn Ly, MD  Procedure Performed:  1. Left Heart Catheterization 2. Selective Coronary Angiography 3. Left ventricular angiogram 4. PTCA/DES x 1 proximal Circumflex  Operator: Lauree Chandler, MD  Arterial access site:  Right radial artery.   Indication:  80 yo male with CAD admitted with ongoing chest pain/elevated troponin. Emergent cath for lateral ST depression. (NSTEMI)                                Procedure Details: The risks, benefits, complications, treatment options, and expected outcomes were discussed with the patient. The patient and/or family concurred with the proposed plan, giving informed consent. The patient was brought to the cath lab from the ED. The patient was further sedated with Versed and Fentanyl. The right wrist was assessed with a modified Allens test which was positive. The right wrist was prepped and draped in a sterile fashion. 1% lidocaine was used for local anesthesia. Using the modified Seldinger access technique, a 5 French sheath was placed in the right radial artery. 3 mg Verapamil was given through the sheath. 4000 units IV heparin was given. Standard diagnostic catheters were used to perform selective coronary angiography. He was found to have a total occlusion of the Circumflex artery. I elected to proceed to PCI.   PCI Note: He was given a weight based bolus of Angiomax and a drip was started. He was given Brilinta 180 mg po x 1. I engaged the left main with a XB LAD 3.5 guiding catheter. When the ACT was over 200, I passed a Cougar IC wire down the Circumflex. Flow was established with the wire. A 2.5 x 12 mm balloon was inflated x 1 in the proximal Circumflex. A 3.5 x 16 mm Promus Premier DES was deployed in the proximal Circumflex. The stent was post-dilated with a 3.5 x 12 mm [place] balloon x 1. The stenosis was taken from 100% down to 0%.   A  pigtail catheter was used to perform a left ventricular angiogram. The sheath was removed from the right radial artery and a hemostasis band was applied at the arteriotomy site on the right wrist.  There were no immediate complications. The patient was taken to the recovery area in stable condition.   Hemodynamic Findings: Central aortic pressure: 103/5/10 Left ventricular pressure: 99/50  Angiographic Findings:  Left main: 10% distal stenosis  Left Anterior Descending Artery: Large caliber vessel that courses to the apex. There is a 60-70% proximal LAD stenosis. The mid vessel has a 50% stenosis. The distal vessel has a long segment of 40% stenosis. The diagonal branch is a moderate caliber vessel with ostial 30% stenosis.   Circumflex Artery:  100% ostial occlusion.   Right Coronary Artery: Large dominant vessel with 30% mid stenosis.   Left Ventricular Angiogram: LVEF=55-60%  Impression: 1. Inferolateral MI secondary to occluded Circumflex 2. Successful PTCA/DES x 1 proximal Circumflex 3. Moderate disease in the proximal LAD 4. Preserved LV systolic function  Recommendations: He will be admitted to the CCU. Echo in am. Will continue ASA and Brilinta. Will continue beta blocker and statin.        Complications:  None. The patient tolerated the procedure well.

## 2014-08-25 NOTE — Telephone Encounter (Signed)
New msg   Patient has chest pain, shortness of breath and left arm pain

## 2014-08-25 NOTE — ED Provider Notes (Signed)
CSN: [id number]     Arrival date & time 08/25/14  1555 History   First MD Initiated Contact with Patient 08/25/14 1602     Chief Complaint  Patient presents with  . Chest Pain     Patient is a 80 y.o. male presenting with chest pain. The history is provided by the patient.  Chest Pain  Mr. Leflore presents with chest pain that is substernal, started at 2:30 this afternoon. Pain is described as "pain", constant, and nonradiating. He also reports some pain in bilateral shoulders and arms. He denies shortness of breath, nausea, vomiting, leg pain, fevers. He has not had similar symptoms previously. He took 325 mg of aspirin prior to ED arrival. Symptoms are reported as moderate.   Past Medical History  Diagnosis Date  . Hyperlipidemia   . BPH (benign prostatic hypertrophy)   . Non-ischemic cardiomyopathy   . Atrial premature beats   . Coronary artery disease    Past Surgical History  Procedure Laterality Date  . Hand surgery Left   . Cataract extraction    . Back surgery    . Hammer  bunion toe surgery    . Pituitary surgery      12/01/09 Dr. Wilburn Cornelia and [name].  . Arthroscopic surgery     Family History  Problem Relation Age of Onset  . Congestive Heart Failure Father 32  . Other Mother 20  . Coronary artery disease Brother   . Other Sister 62    polio   History  Substance Use Topics  . Smoking status: Never Smoker   . Smokeless tobacco: Never Used  . Alcohol Use: Yes     Comment: occas.    Review of Systems  Cardiovascular: Positive for chest pain.  All other systems reviewed and are negative.     Allergies  Sulfa antibiotics; Sulfonamide derivatives; and Tamsulosin  Home Medications   Prior to Admission medications   Medication Sig Start Date End Date Taking? Authorizing Provider  ALPRAZolam Duanne Moron) 0.5 MG tablet Take 0.5 mg by mouth.    Historical Provider, MD  aspirin 81 MG tablet Take 81 mg by mouth daily.      Historical Provider, MD  carvedilol  (COREG) 3.125 MG tablet 1/2 tablet two times a day 07/16/14   [name], MD  cefdinir (OMNICEF) 300 MG capsule  02/23/14   Historical Provider, MD  Cinnamon 500 MG capsule Take 500 mg by mouth daily.      Historical Provider, MD  Coenzyme Q10 (COQ10) 200 MG CAPS Take 1 capsule by mouth daily.      Historical Provider, MD  CRESTOR 5 MG tablet  07/15/14   Historical Provider, MD  diazepam (VALIUM) 5 MG tablet Take 5 mg by mouth.    Historical Provider, MD  ergocalciferol (VITAMIN D2) 50000 UNITS capsule Take 50,000 Units by mouth once a week.      Historical Provider, MD  fenofibrate 160 MG tablet Take 1 tablet by mouth Daily. 01/14/11   Historical Provider, MD  finasteride (PROSCAR) 5 MG tablet Take 5 mg by mouth daily.      Historical Provider, MD  Glucosamine Sulfate 1000 MG CAPS Take 1 capsule by mouth daily.      Historical Provider, MD  hydrocortisone (CORTEF) 10 MG tablet One and on half tablet daily 01/09/11   Historical Provider, MD  levothyroxine (SYNTHROID, LEVOTHROID) 150 MCG tablet Take 150 mcg by mouth daily before breakfast.    Historical Provider, MD  Multiple  Vitamins-Minerals (MEGA MULTI MEN PO) Take 1 tablet by mouth daily.      Historical Provider, MD  omega-3 acid ethyl esters (LOVAZA) 1 G capsule Take by mouth. Take 4 caps daily     Historical Provider, MD  Solifenacin Succinate (VESICARE PO) Take by mouth. TAKE ONE TABLET DAILY AS NEEDED    Historical Provider, MD  tadalafil (CIALIS) 5 MG tablet  05/02/14   Historical Provider, MD  testosterone cypionate (DEPOTESTOTERONE CYPIONATE) 200 MG/ML injection Inject into the muscle every 14 (fourteen) days. 1.62 % injection     Historical Provider, MD  traMADol Veatrice Bourbon) 50 MG tablet  01/21/14   Historical Provider, MD  [facility] 3.75 G PACK  07/14/14   Historical Provider, MD   BP 175/78 mmHg  Pulse 70  Temp(Src) 97.7 F (36.5 C) (Oral)  Resp 18  SpO2 96% Physical Exam  Constitutional: He is oriented to person, place, and time.  He appears well-developed and well-nourished.  Mild distress  HENT:  Head: Normocephalic and atraumatic.  Cardiovascular: Normal rate.   Faint SEM  Pulmonary/Chest: Effort normal. No respiratory distress.  Abdominal: Soft. There is no tenderness. There is no rebound and no guarding.  Musculoskeletal: He exhibits no tenderness.  Trace pitting edema  Neurological: He is alert and oriented to person, place, and time.  Skin: Skin is warm and dry.  Psychiatric: He has a normal mood and affect. His behavior is normal.  Nursing note and vitals reviewed.   ED Course  Procedures (including critical care time) Labs Review Labs Reviewed  I-STAT CHEM 8, ED - Abnormal; Notable for the following:    BUN 25 (*)    Glucose, Bld 101 (*)    Hemoglobin 17.7 (*)    All other components within normal limits  I-STAT TROPOININ, ED - Abnormal; Notable for the following:    Troponin i, poc 0.21 (*)    All other components within normal limits  MRSA PCR SCREENING  CBC    Imaging Review No results found.   EKG Interpretation   Date/Time:  Wednesday August 25 2014 15:55:55 EST Ventricular Rate:  70 PR Interval:  172 QRS Duration: 86 QT Interval:  394 QTC Calculation: 425 R Axis:   29 Text Interpretation:  Sinus rhythm with Premature atrial complexes  Nonspecific ST abnormality Abnormal ECG Confirmed by Hazle Coca [phone]) on  08/25/2014 4:05:21 PM      MDM   Final diagnoses:  Non-ST elevation MI (NSTEMI)    Patient here for evaluation of chest pain. EKG with ST depressions in lateral leads concerning for ischemia. Patient's pain did improve after nitroglycerin. Troponin is elevated. Discussed with cardiology regarding history EKG and labs concerning for NSTEMI. Clinical picture not consistent with dissection or PE.    Quintella Reichert, MD 08/25/14 [phone]

## 2014-08-25 NOTE — ED Notes (Signed)
Pt taken to cath lab with this RN, Carmell Austria, EMT, Dr Burt Knack and cath lab tech. Pt on Zoll.

## 2014-08-25 NOTE — ED Notes (Signed)
Dr Ralene Bathe made aware of blood pressure. No more NTG to be given at this time.

## 2014-08-25 NOTE — ED Notes (Signed)
Pt reports he was sent here by Dr. Trey Paula stating he was having a heart attack. Pt reports midsternal cp that started a few hours ago. Pt took 325 asa this am. Denies any other associated sx. Pt has never had pain like this before. MD to triage to assess pt.

## 2014-08-25 NOTE — ED Notes (Signed)
NOTIFIED DR. REES IN PERSON FOR PATIENTS PANIC LAB RESULTS OF I-STAT TROPONIN =0.21 ng/ml @16 :36 pm ,08/25/2014.

## 2014-08-26 DIAGNOSIS — I420 Dilated cardiomyopathy: Secondary | ICD-10-CM

## 2014-08-26 DIAGNOSIS — I10 Essential (primary) hypertension: Secondary | ICD-10-CM

## 2014-08-26 DIAGNOSIS — I2129 ST elevation (STEMI) myocardial infarction involving other sites: Secondary | ICD-10-CM

## 2014-08-26 LAB — CBC
HCT: 47.1 % (ref 39.0–52.0)
Hemoglobin: 16.7 g/dL (ref 13.0–17.0)
MCH: 31.7 pg (ref 26.0–34.0)
MCHC: 35.5 g/dL (ref 30.0–36.0)
MCV: 89.5 fL (ref 78.0–100.0)
Platelets: 189 10*3/uL (ref 150–400)
RBC: 5.26 MIL/uL (ref 4.22–5.81)
RDW: 13.2 % (ref 11.5–15.5)
WBC: 8.5 10*3/uL (ref 4.0–10.5)

## 2014-08-26 LAB — BASIC METABOLIC PANEL
Anion gap: 14 (ref 5–15)
BUN: 20 mg/dL (ref 6–23)
CO2: 20 meq/L (ref 19–32)
Calcium: 9.4 mg/dL (ref 8.4–10.5)
Chloride: 102 mEq/L (ref 96–112)
Creatinine, Ser: 0.95 mg/dL (ref 0.50–1.35)
GFR calc non Af Amer: 78 mL/min — ABNORMAL LOW (ref >90)
GLUCOSE: 94 mg/dL (ref 70–99)
POTASSIUM: 4.8 meq/L (ref 3.7–5.3)
Sodium: 136 mEq/L — ABNORMAL LOW (ref 137–147)

## 2014-08-26 LAB — CK TOTAL AND CKMB (NOT AT ARMC)
CK TOTAL: 1018 U/L — AB (ref 7–232)
CK, MB: 144.6 ng/mL (ref 0.3–4.0)
Relative Index: 14.2 — ABNORMAL HIGH (ref 0.0–2.5)

## 2014-08-26 LAB — TROPONIN I
Troponin I: >20 ng/mL (ref <0.30)
Troponin I: >20 ng/mL (ref <0.30)

## 2014-08-26 MED ORDER — ALPRAZOLAM 0.5 MG PO TABS
0.5000 mg | ORAL_TABLET | Freq: Every evening | ORAL | Status: DC | PRN
  Administered 2014-08-26: 0.5 mg via ORAL
  Filled 2014-08-26: qty 1

## 2014-08-26 MED ORDER — MIRTAZAPINE 30 MG PO TABS
30.0000 mg | ORAL_TABLET | Freq: Every day | ORAL | Status: DC
  Filled 2014-08-26 (×2): qty 1

## 2014-08-26 MED ORDER — LORATADINE 10 MG PO TABS
10.0000 mg | ORAL_TABLET | Freq: Every day | ORAL | Status: DC
  Administered 2014-08-26 – 2014-08-27 (×2): 10 mg via ORAL
  Filled 2014-08-26 (×2): qty 1

## 2014-08-26 NOTE — Progress Notes (Signed)
SUBJECTIVE:  Mild residual chest pain.  OBJECTIVE:   Vitals:   Filed Vitals:   08/26/14 0400 08/26/14 0500 08/26/14 0600 08/26/14 0800  BP: 183/77 152/105 145/94 146/87  Pulse:      Temp: 98 F (36.7 C)   98 F (36.7 C)  TempSrc: Oral   Oral  Resp: 16 16 14 18   Height:      Weight:      SpO2: 98%   98%   I&O's:   Intake/Output Summary (Last 24 hours) at 08/26/14 1856 Last data filed at 08/26/14 0800  Gross per 24 hour  Intake    350 ml  Output   2100 ml  Net  -1750 ml   TELEMETRY: Reviewed telemetry pt in NSR, PACs:     PHYSICAL EXAM General: Well developed, well nourished, in no acute distress Head:   Normal cephalic and atramatic  Lungs:  Clear bilaterally to auscultation. Heart:   HRRR S1 S2 , premature beats, No JVD.   Abdomen: abdomen soft and non-tender Msk:  Back normal,  Normal strength and tone for age. Extremities:   No edema.  2+ right radial pulse, no hematoma Neuro: Alert and oriented. Psych:  Normal affect, responds appropriately Skin: No rash   LABS: Basic Metabolic Panel:  Recent Labs  08/25/14 1626 08/26/14 0730  NA 139 136*  K 4.5 4.8  CL 100 102  CO2  --  20  GLUCOSE 101* 94  BUN 25* 20  CREATININE 1.20 0.95  CALCIUM  --  9.4   Liver Function Tests: No results for input(s): AST, ALT, ALKPHOS, BILITOT, PROT, ALBUMIN in the last 72 hours. No results for input(s): LIPASE, AMYLASE in the last 72 hours. CBC:  Recent Labs  08/25/14 1611 08/25/14 1626 08/26/14 0730  WBC 7.3  --  8.5  HGB 16.1 17.7* 16.7  HCT 47.3 52.0 47.1  MCV 89.9  --  89.5  PLT 226  --  189   Cardiac Enzymes:  Recent Labs  08/25/14 1930 08/26/14 0042 08/26/14 0730  TROPONINI >20.00* >20.00* >20.00*   BNP: Invalid input(s): POCBNP D-Dimer: No results for input(s): DDIMER in the last 72 hours. Hemoglobin A1C: No results for input(s): HGBA1C in the last 72 hours. Fasting Lipid Panel: No results for input(s): CHOL, HDL, LDLCALC, TRIG, CHOLHDL,  LDLDIRECT in the last 72 hours. Thyroid Function Tests: No results for input(s): TSH, T4TOTAL, T3FREE, THYROIDAB in the last 72 hours.  Invalid input(s): FREET3 Anemia Panel: No results for input(s): VITAMINB12, FOLATE, FERRITIN, TIBC, IRON, RETICCTPCT in the last 72 hours. Coag Panel:   Lab Results  Component Value Date   INR 1.08 02/13/2011    RADIOLOGY: Dg Chest Portable 1 View  08/25/2014   CLINICAL DATA:  Chest pain.  EXAM: PORTABLE CHEST - 1 VIEW  COMPARISON:  02/13/2011  FINDINGS: The heart size and mediastinal contours are within normal limits. Both lungs are clear. The visualized skeletal structures are unremarkable.  IMPRESSION: No active disease.   Electronically Signed   By: Kerby Moors M.D.   On: 08/25/2014 22:15   NSR, PACs, anterior ST depression, less pronounced than yesterdays ECG.   ASSESSMENT: Acute lateral wall MI  PLAN:  Mild chest discomfort persisting. Increase carvedilol to 6.25.  Continue dual antiplatelet therapy.  Cardiomyopathy: EF 40% in 2014. Eho pending from this admission.  No overt heart failure at this time.   Enzymes still quite elevated.  He now thinks he was having some chest pain for the  past few days which was increasing in intensity, and he finally came in yesterday.  HTN: Has not tolerated ACE-I in the past.  Try increased carvedilol.   Watch in ICU today.  Could possibly move out later today if his low level chest discomfort resolves and the bed is needed.  Consider discharge tomorrow.  He is pushing to go home due to his claustrophobia.  Check CKs to look at trend since troponin is not helpful at >20.  Jettie Booze, MD  08/26/2014  9:03 AM

## 2014-08-26 NOTE — Progress Notes (Signed)
CRITICAL VALUE ALERT  Critical value received:  Troponin >20  Date of notification:  08/25/14  Time of notification:  1930  Critical value read back:Yes.    Nurse who received alert:  Newman Nickels RN  Expected value post PCI

## 2014-08-27 DIAGNOSIS — I214 Non-ST elevation (NSTEMI) myocardial infarction: Principal | ICD-10-CM

## 2014-08-27 DIAGNOSIS — I517 Cardiomegaly: Secondary | ICD-10-CM

## 2014-08-27 LAB — POCT ACTIVATED CLOTTING TIME: Activated Clotting Time: 681 seconds

## 2014-08-27 LAB — BASIC METABOLIC PANEL
ANION GAP: 15 (ref 5–15)
BUN: 21 mg/dL (ref 6–23)
CHLORIDE: 100 meq/L (ref 96–112)
CO2: 23 mEq/L (ref 19–32)
Calcium: 9.3 mg/dL (ref 8.4–10.5)
Creatinine, Ser: 1.07 mg/dL (ref 0.50–1.35)
GFR calc Af Amer: 75 mL/min — ABNORMAL LOW (ref >90)
GFR calc non Af Amer: 65 mL/min — ABNORMAL LOW (ref >90)
Glucose, Bld: 91 mg/dL (ref 70–99)
POTASSIUM: 4.8 meq/L (ref 3.7–5.3)
Sodium: 138 mEq/L (ref 137–147)

## 2014-08-27 LAB — CK TOTAL AND CKMB (NOT AT ARMC)
CK, MB: 36.5 ng/mL — AB (ref 0.3–4.0)
Relative Index: 3.9 — ABNORMAL HIGH (ref 0.0–2.5)
Total CK: 934 U/L — ABNORMAL HIGH (ref 7–232)

## 2014-08-27 MED ORDER — TICAGRELOR 90 MG PO TABS: 90.0000 mg | ORAL_TABLET | Freq: Two times a day (BID) | ORAL | Status: DC

## 2014-08-27 MED ORDER — MIRTAZAPINE 30 MG PO TABS: 30.0000 mg | ORAL_TABLET | Freq: Every day | ORAL | Status: DC

## 2014-08-27 MED FILL — Sodium Chloride IV Soln 0.9%: INTRAVENOUS | Qty: 50 | Status: AC

## 2014-08-27 NOTE — Progress Notes (Signed)
CARDIAC REHAB PHASE I  Per pt RN, pt being d/c today.  Completed education with patient.  Pt is hesitant in CRP II, but would like to be contacted later after he thinks about it.  Pt voiced understanding and did not have any questions at this time. [phone]  Lillia Dallas MS, ACSM RCEP 11:23 AM 08/27/2014

## 2014-08-27 NOTE — Discharge Summary (Signed)
Physician Discharge Summary     Cardiologist: Aundra Dubin  Patient ID: [name] MRN: [id number] DOB/AGE: 1934/06/04 80 y.o.  Admit date: 08/25/2014 Discharge date: 08/27/2014  Admission Diagnoses: NSTEMI  Discharge Diagnoses:  Active Problems:   NSTEMI (non-ST elevated myocardial infarction)   HTN     Discharged Condition: stable  Hospital Course:   80 y.o. male w/ PMHx significant for nonischemic cardiomyopathy who presented to [facility] on 08/25/2014 with complaints of chest pain. The patient was in his normal state of health until 2:30 PM this afternoon. At that time he developed substernal chest pressure radiating to his right arm. He states that "I just felt bad." There is no associated shortness of breath, lightheadedness, or syncope. The patient has never had symptoms like this before. At the time of evaluation, he described 2/10 chest pain.  The patient's initial point-of-care troponin is elevated and he has ST segment depression on EKG.  He otherwise reports no recent symptoms and specifically denies fevers/chills/cough. He denies heart palpitations, orthopnea, or PND.  The patient was taken emergently to the cath lab.  Cath revealed a 100% occluded circumflex artery which was successfully stented with a DES x 1.  He has residual nonobstructive disease(See cath report below).  He was load and continued on Brilinta, ASA.  2D echo revealed Normal EF, G1DD(see report below).   BP was controlled on coreg 3.125mg  BID.  The patient was seen by Dr. Percival Spanish who felt he was stable for DC home.   Consults: None  Significant Diagnostic Studies:  Echocardiogram Study Conclusions  - Left ventricle: The cavity size was normal. Wall thickness was increased in a pattern of mild LVH. Systolic function was normal. The estimated ejection fraction was in the range of 55% to 60%. Regional wall motion abnormalities cannot be excluded. Doppler parameters are consistent with  abnormal left ventricular relaxation (grade 1 diastolic dysfunction). The E/e&' ratio is between 8-15, suggesting indeterminate LV filling pressure. - Aortic valve: Sclerosis without stenosis. Transvalvular velocity was minimally increased. There was no regurgitation. Valve area (VTI): 2.91 cm^2. Valve area (Vmax): 2.8 cm^2. Valve area (Vmean): 2.51 cm^2. - Left atrium: Moderately dilated at 45 ml/m2. - Right atrium: The atrium was mildly dilated.  Impressions:  - Compared to the prior echo in 2014, the LVEF has now normalized. Incomplete wall visualization cannot exclude wall motion abnormalities.      PORTABLE CHEST - 1 VIEW  COMPARISON: 02/13/2011  FINDINGS: The heart size and mediastinal contours are within normal limits. Both lungs are clear. The visualized skeletal structures are unremarkable.  IMPRESSION: No active disease.   Cardiac Catheterization Operative Report  ORAN DILLENBURG [id number] 11/25/20156:15 PM Jerlyn Ly, MD  Procedure Performed:  1. Left Heart Catheterization 2. Selective Coronary Angiography 3. Left ventricular angiogram 4. PTCA/DES x 1 proximal Circumflex  Operator: Lauree Chandler, MD  Arterial access site: Right radial artery.   Indication: 80 yo male with CAD admitted with ongoing chest pain/elevated troponin. Emergent cath for lateral ST depression. (NSTEMI)   Procedure Details: The risks, benefits, complications, treatment options, and expected outcomes were discussed with the patient. The patient and/or family concurred with the proposed plan, giving informed consent. The patient was brought to the cath lab from the ED. The patient was further sedated with Versed and Fentanyl. The right wrist was assessed with a modified Allens test which was positive. The right wrist was prepped and draped in a sterile fashion. 1% lidocaine was used for local anesthesia. Using  the modified Seldinger  access technique, a 5 French sheath was placed in the right radial artery. 3 mg Verapamil was given through the sheath. 4000 units IV heparin was given. Standard diagnostic catheters were used to perform selective coronary angiography. He was found to have a total occlusion of the Circumflex artery. I elected to proceed to PCI.   PCI Note: He was given a weight based bolus of Angiomax and a drip was started. He was given Brilinta 180 mg po x 1. I engaged the left main with a XB LAD 3.5 guiding catheter. When the ACT was over 200, I passed a Cougar IC wire down the Circumflex. Flow was established with the wire. A 2.5 x 12 mm balloon was inflated x 1 in the proximal Circumflex. A 3.5 x 16 mm Promus Premier DES was deployed in the proximal Circumflex. The stent was post-dilated with a 3.5 x 12 mm [place] balloon x 1. The stenosis was taken from 100% down to 0%.   A pigtail catheter was used to perform a left ventricular angiogram. The sheath was removed from the right radial artery and a hemostasis band was applied at the arteriotomy site on the right wrist. There were no immediate complications. The patient was taken to the recovery area in stable condition.   Hemodynamic Findings: Central aortic pressure: 103/5/10 Left ventricular pressure: 99/50  Angiographic Findings:  Left main: 10% distal stenosis  Left Anterior Descending Artery: Large caliber vessel that courses to the apex. There is a 60-70% proximal LAD stenosis. The mid vessel has a 50% stenosis. The distal vessel has a long segment of 40% stenosis. The diagonal branch is a moderate caliber vessel with ostial 30% stenosis.   Circumflex Artery: 100% ostial occlusion.   Right Coronary Artery: Large dominant vessel with 30% mid stenosis.   Left Ventricular Angiogram: LVEF=55-60%  Impression: 1. Inferolateral MI secondary to occluded Circumflex 2. Successful PTCA/DES x 1 proximal Circumflex 3. Moderate disease in the proximal LAD 4.  Preserved LV systolic function   Complications: None. The patient tolerated the procedure well.  Treatments: See above  Discharge Exam: Blood pressure 138/78, pulse 71, temperature 97.4 F (36.3 C), temperature source Oral, resp. rate 18, height 6\' 3"  (1.905 m), weight 230 lb 9.6 oz (104.6 kg), SpO2 96 %.   Disposition: 06-Home-Health Care Svc     Medication List    ASK your doctor about these medications        ALPRAZolam 0.5 MG tablet  Commonly known as:  XANAX  Take 0.5 mg by mouth as needed for anxiety.     aspirin 81 MG tablet  Take 81 mg by mouth daily.     carvedilol 3.125 MG tablet  Commonly known as:  COREG  1/2 tablet two times a day     CIALIS 5 MG tablet  Generic drug:  tadalafil  Take 5 mg by mouth daily as needed for erectile dysfunction.     Cinnamon 500 MG capsule  Take 500 mg by mouth daily.     CoQ10 200 MG Caps  Take 1 capsule by mouth daily.     CRESTOR 5 MG tablet  Generic drug:  rosuvastatin  Take 5 mg by mouth daily.     diazepam 5 MG tablet  Commonly known as:  VALIUM  Take 5 mg by mouth as needed for anxiety.     EPIPEN 2-PAK 0.3 mg/0.3 mL Soaj injection  Generic drug:  EPINEPHrine  Inject 1 Dose as directed as  needed.     ergocalciferol 50000 UNITS capsule  Commonly known as:  VITAMIN D2  Take 50,000 Units by mouth once a week.     fenofibrate 160 MG tablet  Take 1 tablet by mouth Daily.     finasteride 5 MG tablet  Commonly known as:  PROSCAR  Take 5 mg by mouth daily.     Glucosamine Sulfate 1000 MG Caps  Take 1 capsule by mouth daily.     hydrocortisone 10 MG tablet  Commonly known as:  CORTEF  Take 15 mg by mouth daily. One and on half tablet daily     levothyroxine 150 MCG tablet  Commonly known as:  SYNTHROID, LEVOTHROID  Take 150 mcg by mouth daily before breakfast.     Red Yeast Rice Extract 600 MG Caps  Take 600 mg by mouth daily.     testosterone cypionate 200 MG/ML injection  Commonly known as:   DEPOTESTOTERONE CYPIONATE  Inject into the muscle every 14 (fourteen) days. 1.62 % injection     VESICARE PO  Take 0.5 tablets by mouth every other day.     WELCHOL 3.75 G Pack  Generic drug:  Colesevelam HCl  Take 1 packet by mouth daily.       Follow-up Information    Follow up with Murray Hodgkins, NP On 09/10/2014.   Specialty:  Nurse Practitioner   Why:  9:00 AM   Contact information:   [address]  [place] [address] [phone]      Greater than 30 minutes was spent completing the patient's discharge.    SignedTarri Fuller, [place] 08/27/2014, 9:22 AM  Patient seen and examined.  Plan as discussed in my rounding note for today and outlined above. Jeneen Rinks [facility]  08/27/2014  12:42 PM

## 2014-08-27 NOTE — Progress Notes (Signed)
    SUBJECTIVE:  No further chest pain.  No SOB.   PHYSICAL EXAM Filed Vitals:   08/26/14 2234 08/27/14 0000 08/27/14 0224 08/27/14 0500  BP: 138/77 149/78 138/85 138/78  Pulse: 68 81 71 71  Temp:  97.7 F (36.5 C)  97.4 F (36.3 C)  TempSrc:  Oral  Oral  Resp: 16 18  18   Height:      Weight:      SpO2: 97% 95%  96%   General:  No acute distress Lungs:  Clear Heart:  RRR, no rub Abdomen:  Positive bowel sounds, no rebound no guarding Extremities:  No edema   LABS: Lab Results  Component Value Date   TROPONINI >20.00* 08/26/2014   Results for orders placed or performed during the hospital encounter of 08/25/14 (from the past 24 hour(s))   CK total and CKMB (cardiac)     Status: Abnormal   Collection Time: 08/27/14  2:30 AM  Result Value Ref Range   Total CK 934 (H) 7 - 232 U/L   CK, MB 36.5 (HH) 0.3 - 4.0 ng/mL   Relative Index 3.9 (H) 0.0 - 2.5  Basic metabolic panel     Status: Abnormal   Collection Time: 08/27/14  2:30 AM  Result Value Ref Range   Sodium 138 137 - 147 mEq/L   Potassium 4.8 3.7 - 5.3 mEq/L   Chloride 100 96 - 112 mEq/L   CO2 23 19 - 32 mEq/L   Glucose, Bld 91 70 - 99 mg/dL   BUN 21 6 - 23 mg/dL   Creatinine, Ser 1.07 0.50 - 1.35 mg/dL   Calcium 9.3 8.4 - 10.5 mg/dL   GFR calc non Af Amer 65 (L) >90 mL/min   GFR calc Af Amer 75 (L) >90 mL/min   Anion gap 15 5 - 15    Intake/Output Summary (Last 24 hours) at 08/27/14 0815 Last data filed at 08/26/14 1800  Gross per 24 hour  Intake    720 ml  Output      0 ml  Net    720 ml    ASSESSMENT AND PLAN:  Acute inferolateral MI:  Occluded circ status post DES.  Residual nonobstructive disease managed medically.    Previous History of nonischemic cardiomyopathy:   EF 55 - 60% by cath.  Echo is pending.  We called them to do him early so that he can go home.   HTN:  BP controlled   OK to discharge home.     Jeneen Rinks [facility] 08/27/2014 8:15 AM

## 2014-08-27 NOTE — Progress Notes (Signed)
Echocardiogram 2D Echocardiogram has been performed.  Genevive Printup 08/27/2014, 10:38 AM

## 2014-08-30 DIAGNOSIS — J301 Allergic rhinitis due to pollen: Secondary | ICD-10-CM | POA: Diagnosis not present

## 2014-08-30 DIAGNOSIS — J3089 Other allergic rhinitis: Secondary | ICD-10-CM | POA: Diagnosis not present

## 2014-08-30 DIAGNOSIS — J3081 Allergic rhinitis due to animal (cat) (dog) hair and dander: Secondary | ICD-10-CM | POA: Diagnosis not present

## 2014-09-01 ENCOUNTER — Telehealth: Payer: Self-pay | Admitting: Cardiology

## 2014-09-01 MED ORDER — NITROGLYCERIN 0.4 MG SL SUBL: 0.4000 mg | SUBLINGUAL_TABLET | SUBLINGUAL | Status: DC | PRN

## 2014-09-01 NOTE — Telephone Encounter (Signed)
LMTCB

## 2014-09-01 NOTE — Telephone Encounter (Signed)
New problem   Pt was discharged from hospital and was told to take nitro, but wasn't given a prescription... [facility]. Please advise

## 2014-09-01 NOTE — Telephone Encounter (Signed)
Spoke with patient--advised would call in a prescription for NTG but he should not take NTG within 24 hours of taking Cialis. Pt states he is no longer taking Cialis.   Pt mention he had redness in the tissue of his right eye starting a short time ago.  Pt denies vision problems or pain in his right eye.   Dr Aundra Dubin discussed with patient and advised pt to continue Brilinta and aspirin.  Pt was advised to see his eye doctor if the redness does not stay the same and gradually improve.

## 2014-09-06 ENCOUNTER — Encounter: Payer: Self-pay | Admitting: Cardiology

## 2014-09-06 ENCOUNTER — Ambulatory Visit (INDEPENDENT_AMBULATORY_CARE_PROVIDER_SITE_OTHER): Payer: Medicare Other | Admitting: Cardiology

## 2014-09-06 VITALS — BP 128/84 | HR 68 | Ht 75.0 in | Wt 237.2 lb

## 2014-09-06 DIAGNOSIS — I2510 Atherosclerotic heart disease of native coronary artery without angina pectoris: Secondary | ICD-10-CM | POA: Diagnosis not present

## 2014-09-06 DIAGNOSIS — E7800 Pure hypercholesterolemia, unspecified: Secondary | ICD-10-CM

## 2014-09-06 DIAGNOSIS — I428 Other cardiomyopathies: Secondary | ICD-10-CM

## 2014-09-06 DIAGNOSIS — I429 Cardiomyopathy, unspecified: Secondary | ICD-10-CM | POA: Diagnosis not present

## 2014-09-06 MED ORDER — ROSUVASTATIN CALCIUM 5 MG PO TABS: 5.0000 mg | ORAL_TABLET | Freq: Every day | ORAL | Status: DC

## 2014-09-06 MED ORDER — CARVEDILOL 3.125 MG PO TABS: 1.5625 mg | ORAL_TABLET | Freq: Every day | ORAL | Status: DC

## 2014-09-06 NOTE — Progress Notes (Signed)
Patient ID: [name], male   DOB: [birth year], 80 y.o.   MRN: [id number] PCP: [name]  80 yo with history of pituitary adenoma s/p resection, nonobstructive CAD, nonischemic cardiomyopathy, and chronic atrial bigeminy presents for cardiology followup.  Patient has a long history of atrial bigeminy.  He is in this today.  He had had a nonischemic cardiomyopathy with EF around 40% but most recent echo showed normal EF.  Historically, he has not been able to tolerate statins, and he has been tried on multiple agents. At a prior appointment, we talked about him going on a PCSK9-inhibitor, but he decided to try Crestor 1 more time and started taking 2.5 mg every other day.    In 11/15, he developed chest tightness and was admitted to [facility] with NSTEMI.  LHC showed 60-70% proximal LAD stenosis with totally occluded LCx.  This was opened with DES to the proximal LCx.  Echo post-cath showed EF 55-60%.  He has done well since that time.  No further chest pain.  He denies exertional dyspnea.  He actually went hunting last weekend with no problems.  No orthopnea/PND, no lightheadedness.  He is still only taking Coreg 3.125 mg 1/2 tab once a day.  He has had difficulty with BP-active meds in the setting of adrenal insufficiency post pituitary surgery. He developed some bruising around the right eye at the end of last week but this is improving.   ECG: NSR, bigeminal PACs, early R wave progression.   Labs (5/12): K 4.3, creatinine 1.03 Labs (2/15): K 4, creatinine 1.1, LDL 184 Labs (11/15): K 4.8, creatinine 1.07  PMH: 1. Hyperlipidemia: He has been unable to tolerate statins.  2. Chronic atrial bigeminy: x years.  3. Hypothyroidism: s/p surgery for pituitary adenoma.  4. Pituitary adenoma: s/p surgery in 3/11 for removal.  Now on thyroid replacement and hydrocortisone.  5. Cardiomyopathy: Nonischemic.  LHC (2009) with 50% mid LAD, 60% distal LAD.  Left ventriculogram (2009) with EF 40-45%.  Echo (8/13) with  EF 35-40%, mild LVH.  Echo (11/14) with EF 40%, diffuse hypokinesis, normal RV.  He was tried in the past on an ACEI but apparently at that time began to have side effects from his pituitary adenoma that were thought initially to be ACEI-related.   He has been off ACEI since that time.  He has had trouble tolerating more that 1/2 tab of 3.125 mg Coreg once a day. Echo (11/15) with EF 55-60%, aortic sclerosis without significant stenosis.  6. CAD: 2009 LHC with 50% mid, 60% distal LAD. NSTEMI 11/15 with 60-70% pLAD, totally occluded LCx with DES to proximal LCx.  EF 55-60% by LV-gram.  7. Mild cognitive impairment.   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   FH: Brother with CAD.  ROS: All systems reviewed and negative except as per HPI.   Current Outpatient Prescriptions  Medication Sig Dispense Refill  . ALPRAZolam (XANAX) 0.5 MG tablet Take 0.5 mg by mouth as needed for anxiety.     Marland Kitchen aspirin 81 MG tablet Take 81 mg by mouth daily.      . carvedilol (COREG) 3.125 MG tablet Take 0.5 tablets (1.5625 mg total) by mouth daily. 1/2 tablet two times a day 30 tablet 6  . CIALIS 5 MG tablet Take 5 mg by mouth daily.  1  . Coenzyme Q10 (COQ10) 200 MG CAPS Take 1 capsule by mouth daily.      . CRESTOR 5 MG tablet Take  2.5 mg by mouth daily.     . diazepam (VALIUM) 5 MG tablet Take 5 mg by mouth as needed for anxiety.     Marland Kitchen EPIPEN 2-PAK 0.3 MG/0.3ML SOAJ injection Inject 1 Dose as directed as needed.  0  . ergocalciferol (VITAMIN D2) 50000 UNITS capsule Take 50,000 Units by mouth once a week.      . finasteride (PROSCAR) 5 MG tablet Take 5 mg by mouth daily.      . Glucosamine Sulfate 1000 MG CAPS Take 1 capsule by mouth daily.      Marland Kitchen levothyroxine (SYNTHROID, LEVOTHROID) 150 MCG tablet Take 150 mcg by mouth daily before breakfast.    . mirtazapine (REMERON) 30 MG tablet Take 1 tablet (30 mg total) by mouth at bedtime. 30 tablet 5  . nitroGLYCERIN (NITROSTAT) 0.4 MG SL  tablet Place 1 tablet (0.4 mg total) under the tongue every 5 (five) minutes as needed for chest pain. 90 tablet 3  . Red Yeast Rice Extract 600 MG CAPS Take 600 mg by mouth daily.    . Solifenacin Succinate (VESICARE PO) Take 0.5 tablets by mouth every other day.     . testosterone cypionate (DEPOTESTOTERONE CYPIONATE) 200 MG/ML injection Inject into the muscle every 14 (fourteen) days. 1.62 % injection     . ticagrelor (BRILINTA) 90 MG TABS tablet Take 1 tablet (90 mg total) by mouth 2 (two) times daily. 60 tablet 10  . WELCHOL 3.75 G PACK Take 1 packet by mouth daily.     . Cinnamon 500 MG capsule Take 500 mg by mouth daily.      . hydrocortisone (CORTEF) 10 MG tablet Take 15 mg by mouth daily. One and on half tablet daily    . rosuvastatin (CRESTOR) 5 MG tablet Take 1 tablet (5 mg total) by mouth daily. 30 tablet 3   No current facility-administered medications for this visit.    BP 128/84 mmHg  Pulse 68  Ht 6\' 3"  (1.905 m)  Wt 237 lb 3.2 oz (107.593 kg)  BMI 29.65 kg/m2 General: NAD Neck: No JVD, no thyromegaly or thyroid nodule.  Lungs: Clear to auscultation bilaterally with normal respiratory effort. CV: Nondisplaced PMI.  Heart regular S1/S2, no S3/S4, no murmur.  1+ ankle edema bilaterally.  No carotid bruit.  Normal pedal pulses.  Abdomen: Soft, nontender, no hepatosplenomegaly, no distention.  Skin: Intact without lesions or rashes.  Neurologic: Alert and oriented x 3.  Psych: Normal affect. Extremities: No clubbing or cyanosis.   Assessment/Plan: 1. Cardiomyopathy: EF 40% with diffuse hypokinesis on 11/14 echo, but echo post-NSTEMI actually showed EF up to 55-60%.  He is euvolemic with NYHA class I symptoms.   2. CAD: s/p NSTEMI.  No further chest pain.  He has not tolerated statins in the past but is doing ok on Crestor 2.5 mg every other day.  Will continue ASA 81 and Brilinta 90 mg bid.  - Increase Coreg to 3.125 mg 1/2 tab bid rather than qd.  - Increase Crestor to 5  mg daily - Start cardiac rehab.  3. Hyperlipidemia: He has not tolerated multiple statins but has been able to take Crestor 2.5 mg every other day so far.  I will increase Crestor to 5 mg daily.  If I am unable to titrate him up to a reasonable dose of Crestor and LDL stays above goal < 70, I think that he would be a good candidate for Praluent or Repatha. I will try to increase Crestor again  when I see him for followup in 1 month.  Will check lipids/LFTs in 2-3 months.   Followup in 1 month.   [name] 09/06/2014

## 2014-09-06 NOTE — Patient Instructions (Signed)
Take Crestor 5mg  daily.   Take coreg (carvedilol) 1/2 of a 3.125mg  tablet two times a day.  Your physician recommends that you schedule a follow-up appointment in: 1 month with Dr Aundra Dubin.  Your physician recommends that you return for a FASTING lipid profile /liver profile in 2 months.   Let me know if you do not hear from [place] [phone].

## 2014-09-07 DIAGNOSIS — J301 Allergic rhinitis due to pollen: Secondary | ICD-10-CM | POA: Diagnosis not present

## 2014-09-07 DIAGNOSIS — J3089 Other allergic rhinitis: Secondary | ICD-10-CM | POA: Diagnosis not present

## 2014-09-07 DIAGNOSIS — J3081 Allergic rhinitis due to animal (cat) (dog) hair and dander: Secondary | ICD-10-CM | POA: Diagnosis not present

## 2014-09-09 ENCOUNTER — Encounter (HOSPITAL_COMMUNITY): Payer: Self-pay | Admitting: Cardiovascular Disease

## 2014-09-10 ENCOUNTER — Encounter: Payer: Medicare Other | Admitting: Nurse Practitioner

## 2014-09-14 ENCOUNTER — Telehealth: Payer: Self-pay | Admitting: Cardiology

## 2014-09-14 NOTE — Telephone Encounter (Signed)
New message      Pt has a stent put in wed before thanksgiving.  He needs to get into cardiac rehab.  No one has signed the orders for pt to go to cardiac rehab.  Pt called cardiac rehab--but they said they are waiting for [place] to call.  Please call and get him in cardiac rehab

## 2014-09-14 NOTE — Telephone Encounter (Signed)
Called cardiac rehab. They need Dr.Mclean's signature on the cardiac rehab form and then faxed back to them. Provided out fax number for them to send here again. Will forward to Desiree Lucy RN to follow up tomorrow.

## 2014-09-15 DIAGNOSIS — E291 Testicular hypofunction: Secondary | ICD-10-CM | POA: Diagnosis not present

## 2014-09-15 NOTE — Addendum Note (Signed)
Addended by: Katrine Coho on: 09/15/2014 03:57 PM   Modules accepted: Orders, Medications

## 2014-09-15 NOTE — Telephone Encounter (Signed)
Pt advised order has been signed and will be faxed to Cardiac Rehab.

## 2014-09-15 NOTE — Telephone Encounter (Signed)
Pt states he has been unable to tolerate crestor or any statins.  Pt states Dr Joylene Draft recommended referral to lipid clinic for evaluation for PCSK9 inhibitor. Dr Aundra Dubin agree with this recommendation. I will send message to lipid clinic to contact pt for PCSK9 inhibitor evaluation.

## 2014-09-15 NOTE — Telephone Encounter (Signed)
Cardiac Rehab order form signed by Dr Aundra Dubin and returned to HIM to be faxed to Cardiac Rehab.

## 2014-09-16 DIAGNOSIS — J301 Allergic rhinitis due to pollen: Secondary | ICD-10-CM | POA: Diagnosis not present

## 2014-09-16 DIAGNOSIS — J3089 Other allergic rhinitis: Secondary | ICD-10-CM | POA: Diagnosis not present

## 2014-09-16 DIAGNOSIS — J3081 Allergic rhinitis due to animal (cat) (dog) hair and dander: Secondary | ICD-10-CM | POA: Diagnosis not present

## 2014-09-17 ENCOUNTER — Telehealth: Payer: Self-pay | Admitting: Cardiology

## 2014-09-17 NOTE — Telephone Encounter (Signed)
[phone] or [phone] pt requesting call from [facility]

## 2014-09-17 NOTE — Telephone Encounter (Signed)
Pt calling about referral to lipid clinic for evaluation for PCSK9 inhibitor, referral made 09/15/14, pt advised.

## 2014-09-20 NOTE — Telephone Encounter (Signed)
Spoke with pt.  Had discussion about PCSK-9 and records that would need to be obtained.  He has done all of his cholesterol management with [name] in the past.  Suggested he continue to work with [name] for this in the future to prevent a third party from getting into the mix.  He is agreeable to this plan.

## 2014-09-21 ENCOUNTER — Encounter (HOSPITAL_COMMUNITY)
Admission: RE | Admit: 2014-09-21 | Discharge: 2014-09-21 | Disposition: A | Discharge status: Home / Self Care | Payer: Medicare Other | Source: Ambulatory Visit | Attending: Cardiology | Admitting: Cardiology

## 2014-09-21 DIAGNOSIS — I2510 Atherosclerotic heart disease of native coronary artery without angina pectoris: Secondary | ICD-10-CM | POA: Insufficient documentation

## 2014-09-21 DIAGNOSIS — Z5189 Encounter for other specified aftercare: Secondary | ICD-10-CM | POA: Insufficient documentation

## 2014-09-21 DIAGNOSIS — Z7982 Long term (current) use of aspirin: Secondary | ICD-10-CM | POA: Insufficient documentation

## 2014-09-21 DIAGNOSIS — N400 Enlarged prostate without lower urinary tract symptoms: Secondary | ICD-10-CM | POA: Insufficient documentation

## 2014-09-21 DIAGNOSIS — Z955 Presence of coronary angioplasty implant and graft: Secondary | ICD-10-CM | POA: Insufficient documentation

## 2014-09-21 DIAGNOSIS — I2582 Chronic total occlusion of coronary artery: Secondary | ICD-10-CM | POA: Insufficient documentation

## 2014-09-21 DIAGNOSIS — I428 Other cardiomyopathies: Secondary | ICD-10-CM | POA: Insufficient documentation

## 2014-09-21 DIAGNOSIS — F40240 Claustrophobia: Secondary | ICD-10-CM | POA: Insufficient documentation

## 2014-09-21 DIAGNOSIS — I10 Essential (primary) hypertension: Secondary | ICD-10-CM | POA: Insufficient documentation

## 2014-09-21 DIAGNOSIS — Z882 Allergy status to sulfonamides status: Secondary | ICD-10-CM | POA: Insufficient documentation

## 2014-09-21 DIAGNOSIS — Z79899 Other long term (current) drug therapy: Secondary | ICD-10-CM | POA: Insufficient documentation

## 2014-09-21 DIAGNOSIS — E785 Hyperlipidemia, unspecified: Secondary | ICD-10-CM | POA: Insufficient documentation

## 2014-09-21 DIAGNOSIS — I252 Old myocardial infarction: Secondary | ICD-10-CM | POA: Insufficient documentation

## 2014-09-21 DIAGNOSIS — Z8249 Family history of ischemic heart disease and other diseases of the circulatory system: Secondary | ICD-10-CM | POA: Insufficient documentation

## 2014-09-21 NOTE — Progress Notes (Signed)
Cardiac Rehab Medication Review by a Pharmacist  Does the patient  feel that his/her medications are working for him/her?  yes  Has the patient been experiencing any side effects to the medications prescribed?  Yes. Has tried 5 statins in the past and all give him muscle aches  Does the patient measure his/her own blood pressure or blood glucose at home?  no   Does the patient have any problems obtaining medications due to transportation or finances?   no  Understanding of regimen: good Understanding of indications: good Potential of compliance: good    Pharmacist comments: Pt reports compliance with all medications except for his Remeron and Crestor which he is not taking anymore. Pt and wife have no further questions or concerns today.    Reginia Naas 09/21/2014 8:28 AM

## 2014-09-22 DIAGNOSIS — J301 Allergic rhinitis due to pollen: Secondary | ICD-10-CM | POA: Diagnosis not present

## 2014-09-22 DIAGNOSIS — J3081 Allergic rhinitis due to animal (cat) (dog) hair and dander: Secondary | ICD-10-CM | POA: Diagnosis not present

## 2014-09-22 DIAGNOSIS — J3089 Other allergic rhinitis: Secondary | ICD-10-CM | POA: Diagnosis not present

## 2014-09-23 ENCOUNTER — Other Ambulatory Visit: Payer: Self-pay | Admitting: *Deleted

## 2014-09-23 DIAGNOSIS — J301 Allergic rhinitis due to pollen: Secondary | ICD-10-CM | POA: Diagnosis not present

## 2014-09-23 DIAGNOSIS — J3089 Other allergic rhinitis: Secondary | ICD-10-CM | POA: Diagnosis not present

## 2014-09-23 DIAGNOSIS — J3081 Allergic rhinitis due to animal (cat) (dog) hair and dander: Secondary | ICD-10-CM | POA: Diagnosis not present

## 2014-09-23 MED ORDER — TICAGRELOR 90 MG PO TABS: 90.0000 mg | ORAL_TABLET | Freq: Two times a day (BID) | ORAL | Status: DC

## 2014-09-27 ENCOUNTER — Encounter (HOSPITAL_COMMUNITY)
Admission: RE | Admit: 2014-09-27 | Discharge: 2014-09-27 | Disposition: A | Discharge status: Home / Self Care | Payer: Medicare Other | Source: Ambulatory Visit | Attending: Cardiology | Admitting: Cardiology

## 2014-09-27 DIAGNOSIS — I2510 Atherosclerotic heart disease of native coronary artery without angina pectoris: Secondary | ICD-10-CM | POA: Diagnosis not present

## 2014-09-27 DIAGNOSIS — I252 Old myocardial infarction: Secondary | ICD-10-CM | POA: Diagnosis not present

## 2014-09-27 DIAGNOSIS — I428 Other cardiomyopathies: Secondary | ICD-10-CM | POA: Diagnosis not present

## 2014-09-27 DIAGNOSIS — Z79899 Other long term (current) drug therapy: Secondary | ICD-10-CM | POA: Diagnosis not present

## 2014-09-27 DIAGNOSIS — E785 Hyperlipidemia, unspecified: Secondary | ICD-10-CM | POA: Diagnosis not present

## 2014-09-27 DIAGNOSIS — Z5189 Encounter for other specified aftercare: Secondary | ICD-10-CM | POA: Diagnosis not present

## 2014-09-27 DIAGNOSIS — N400 Enlarged prostate without lower urinary tract symptoms: Secondary | ICD-10-CM | POA: Diagnosis not present

## 2014-09-27 DIAGNOSIS — F40240 Claustrophobia: Secondary | ICD-10-CM | POA: Diagnosis not present

## 2014-09-27 DIAGNOSIS — I2582 Chronic total occlusion of coronary artery: Secondary | ICD-10-CM | POA: Diagnosis not present

## 2014-09-27 DIAGNOSIS — I10 Essential (primary) hypertension: Secondary | ICD-10-CM | POA: Diagnosis not present

## 2014-09-27 DIAGNOSIS — Z8249 Family history of ischemic heart disease and other diseases of the circulatory system: Secondary | ICD-10-CM | POA: Diagnosis not present

## 2014-09-27 DIAGNOSIS — Z882 Allergy status to sulfonamides status: Secondary | ICD-10-CM | POA: Diagnosis not present

## 2014-09-27 DIAGNOSIS — Z7982 Long term (current) use of aspirin: Secondary | ICD-10-CM | POA: Diagnosis not present

## 2014-09-27 DIAGNOSIS — Z955 Presence of coronary angioplasty implant and graft: Secondary | ICD-10-CM | POA: Diagnosis not present

## 2014-09-27 NOTE — Progress Notes (Signed)
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 Telemetry rhythm Sinus with bigeminal PAC's this is chronic.  Vital signs stable. 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 No psychosocial needs assessed at this time. PHQ score =1. Will continue to monitor the patient throughout  the program.

## 2014-09-27 NOTE — Progress Notes (Signed)
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

## 2014-09-28 DIAGNOSIS — J3089 Other allergic rhinitis: Secondary | ICD-10-CM | POA: Diagnosis not present

## 2014-09-28 DIAGNOSIS — J301 Allergic rhinitis due to pollen: Secondary | ICD-10-CM | POA: Diagnosis not present

## 2014-09-28 DIAGNOSIS — J3081 Allergic rhinitis due to animal (cat) (dog) hair and dander: Secondary | ICD-10-CM | POA: Diagnosis not present

## 2014-09-29 ENCOUNTER — Encounter (HOSPITAL_COMMUNITY)
Admission: RE | Admit: 2014-09-29 | Discharge: 2014-09-29 | Disposition: A | Discharge status: Home / Self Care | Payer: Medicare Other | Source: Ambulatory Visit | Attending: Cardiology | Admitting: Cardiology

## 2014-09-29 DIAGNOSIS — Z5189 Encounter for other specified aftercare: Secondary | ICD-10-CM | POA: Diagnosis not present

## 2014-09-29 DIAGNOSIS — Z955 Presence of coronary angioplasty implant and graft: Secondary | ICD-10-CM | POA: Diagnosis not present

## 2014-09-29 DIAGNOSIS — I10 Essential (primary) hypertension: Secondary | ICD-10-CM | POA: Diagnosis not present

## 2014-09-29 DIAGNOSIS — I252 Old myocardial infarction: Secondary | ICD-10-CM | POA: Diagnosis not present

## 2014-09-29 DIAGNOSIS — I2582 Chronic total occlusion of coronary artery: Secondary | ICD-10-CM | POA: Diagnosis not present

## 2014-09-29 DIAGNOSIS — I428 Other cardiomyopathies: Secondary | ICD-10-CM | POA: Diagnosis not present

## 2014-10-04 ENCOUNTER — Encounter (HOSPITAL_COMMUNITY)
Admission: RE | Admit: 2014-10-04 | Discharge: 2014-10-04 | Disposition: A | Discharge status: Home / Self Care | Payer: Medicare Other | Source: Ambulatory Visit | Attending: Cardiology | Admitting: Cardiology

## 2014-10-04 DIAGNOSIS — E785 Hyperlipidemia, unspecified: Secondary | ICD-10-CM | POA: Insufficient documentation

## 2014-10-04 DIAGNOSIS — N400 Enlarged prostate without lower urinary tract symptoms: Secondary | ICD-10-CM | POA: Diagnosis not present

## 2014-10-04 DIAGNOSIS — F40240 Claustrophobia: Secondary | ICD-10-CM | POA: Insufficient documentation

## 2014-10-04 DIAGNOSIS — Z5189 Encounter for other specified aftercare: Secondary | ICD-10-CM | POA: Diagnosis not present

## 2014-10-04 DIAGNOSIS — Z79899 Other long term (current) drug therapy: Secondary | ICD-10-CM | POA: Diagnosis not present

## 2014-10-04 DIAGNOSIS — J3089 Other allergic rhinitis: Secondary | ICD-10-CM | POA: Diagnosis not present

## 2014-10-04 DIAGNOSIS — Z8249 Family history of ischemic heart disease and other diseases of the circulatory system: Secondary | ICD-10-CM | POA: Insufficient documentation

## 2014-10-04 DIAGNOSIS — Z882 Allergy status to sulfonamides status: Secondary | ICD-10-CM | POA: Insufficient documentation

## 2014-10-04 DIAGNOSIS — I428 Other cardiomyopathies: Secondary | ICD-10-CM | POA: Insufficient documentation

## 2014-10-04 DIAGNOSIS — I252 Old myocardial infarction: Secondary | ICD-10-CM | POA: Insufficient documentation

## 2014-10-04 DIAGNOSIS — I2582 Chronic total occlusion of coronary artery: Secondary | ICD-10-CM | POA: Diagnosis not present

## 2014-10-04 DIAGNOSIS — I10 Essential (primary) hypertension: Secondary | ICD-10-CM | POA: Diagnosis not present

## 2014-10-04 DIAGNOSIS — I2510 Atherosclerotic heart disease of native coronary artery without angina pectoris: Secondary | ICD-10-CM | POA: Diagnosis not present

## 2014-10-04 DIAGNOSIS — Z7982 Long term (current) use of aspirin: Secondary | ICD-10-CM | POA: Diagnosis not present

## 2014-10-04 DIAGNOSIS — J3081 Allergic rhinitis due to animal (cat) (dog) hair and dander: Secondary | ICD-10-CM | POA: Diagnosis not present

## 2014-10-04 DIAGNOSIS — Z955 Presence of coronary angioplasty implant and graft: Secondary | ICD-10-CM | POA: Diagnosis not present

## 2014-10-04 DIAGNOSIS — J301 Allergic rhinitis due to pollen: Secondary | ICD-10-CM | POA: Diagnosis not present

## 2014-10-04 NOTE — Progress Notes (Signed)
Reviewed home exercise with pt today.  Pt plans to continue walking at home for exercise.  Reviewed THR, pulse (needs practice, class on Friday), RPE, sign and symptoms, NTG use, and when to call 911 or MD.  Pt voiced understanding. Alberteen Sam, MA, ACSM RCEP

## 2014-10-06 ENCOUNTER — Encounter (HOSPITAL_COMMUNITY)
Admission: RE | Admit: 2014-10-06 | Discharge: 2014-10-06 | Disposition: A | Discharge status: Home / Self Care | Payer: Medicare Other | Source: Ambulatory Visit | Attending: Cardiology | Admitting: Cardiology

## 2014-10-06 DIAGNOSIS — Z955 Presence of coronary angioplasty implant and graft: Secondary | ICD-10-CM | POA: Diagnosis not present

## 2014-10-06 DIAGNOSIS — I2582 Chronic total occlusion of coronary artery: Secondary | ICD-10-CM | POA: Diagnosis not present

## 2014-10-06 DIAGNOSIS — Z5189 Encounter for other specified aftercare: Secondary | ICD-10-CM | POA: Diagnosis not present

## 2014-10-06 DIAGNOSIS — E291 Testicular hypofunction: Secondary | ICD-10-CM | POA: Diagnosis not present

## 2014-10-06 DIAGNOSIS — I252 Old myocardial infarction: Secondary | ICD-10-CM | POA: Diagnosis not present

## 2014-10-06 DIAGNOSIS — I428 Other cardiomyopathies: Secondary | ICD-10-CM | POA: Diagnosis not present

## 2014-10-06 DIAGNOSIS — I10 Essential (primary) hypertension: Secondary | ICD-10-CM | POA: Diagnosis not present

## 2014-10-08 ENCOUNTER — Encounter (HOSPITAL_COMMUNITY)
Admission: RE | Admit: 2014-10-08 | Discharge: 2014-10-08 | Disposition: A | Discharge status: Home / Self Care | Payer: Medicare Other | Source: Ambulatory Visit | Attending: Cardiology | Admitting: Cardiology

## 2014-10-08 DIAGNOSIS — I428 Other cardiomyopathies: Secondary | ICD-10-CM | POA: Diagnosis not present

## 2014-10-08 DIAGNOSIS — Z955 Presence of coronary angioplasty implant and graft: Secondary | ICD-10-CM | POA: Diagnosis not present

## 2014-10-08 DIAGNOSIS — I10 Essential (primary) hypertension: Secondary | ICD-10-CM | POA: Diagnosis not present

## 2014-10-08 DIAGNOSIS — I2582 Chronic total occlusion of coronary artery: Secondary | ICD-10-CM | POA: Diagnosis not present

## 2014-10-08 DIAGNOSIS — Z5189 Encounter for other specified aftercare: Secondary | ICD-10-CM | POA: Diagnosis not present

## 2014-10-08 DIAGNOSIS — I252 Old myocardial infarction: Secondary | ICD-10-CM | POA: Diagnosis not present

## 2014-10-11 ENCOUNTER — Encounter (HOSPITAL_COMMUNITY)
Admission: RE | Admit: 2014-10-11 | Discharge: 2014-10-11 | Disposition: A | Discharge status: Home / Self Care | Payer: Medicare Other | Source: Ambulatory Visit | Attending: Cardiology | Admitting: Cardiology

## 2014-10-11 DIAGNOSIS — J3089 Other allergic rhinitis: Secondary | ICD-10-CM | POA: Diagnosis not present

## 2014-10-11 DIAGNOSIS — Z955 Presence of coronary angioplasty implant and graft: Secondary | ICD-10-CM | POA: Diagnosis not present

## 2014-10-11 DIAGNOSIS — I428 Other cardiomyopathies: Secondary | ICD-10-CM | POA: Diagnosis not present

## 2014-10-11 DIAGNOSIS — I252 Old myocardial infarction: Secondary | ICD-10-CM | POA: Diagnosis not present

## 2014-10-11 DIAGNOSIS — Z5189 Encounter for other specified aftercare: Secondary | ICD-10-CM | POA: Diagnosis not present

## 2014-10-11 DIAGNOSIS — I2582 Chronic total occlusion of coronary artery: Secondary | ICD-10-CM | POA: Diagnosis not present

## 2014-10-11 DIAGNOSIS — I10 Essential (primary) hypertension: Secondary | ICD-10-CM | POA: Diagnosis not present

## 2014-10-11 DIAGNOSIS — J301 Allergic rhinitis due to pollen: Secondary | ICD-10-CM | POA: Diagnosis not present

## 2014-10-11 DIAGNOSIS — J3081 Allergic rhinitis due to animal (cat) (dog) hair and dander: Secondary | ICD-10-CM | POA: Diagnosis not present

## 2014-10-13 ENCOUNTER — Encounter (HOSPITAL_COMMUNITY)
Admission: RE | Admit: 2014-10-13 | Discharge: 2014-10-13 | Disposition: A | Discharge status: Home / Self Care | Payer: Medicare Other | Source: Ambulatory Visit | Attending: Cardiology | Admitting: Cardiology

## 2014-10-13 ENCOUNTER — Ambulatory Visit (INDEPENDENT_AMBULATORY_CARE_PROVIDER_SITE_OTHER): Payer: Medicare Other | Admitting: Cardiology

## 2014-10-13 ENCOUNTER — Encounter: Payer: Self-pay | Admitting: Cardiology

## 2014-10-13 ENCOUNTER — Encounter: Payer: Self-pay | Admitting: *Deleted

## 2014-10-13 VITALS — BP 120/72 | HR 35 | Ht 75.5 in | Wt 227.1 lb

## 2014-10-13 DIAGNOSIS — I10 Essential (primary) hypertension: Secondary | ICD-10-CM | POA: Diagnosis not present

## 2014-10-13 DIAGNOSIS — R0989 Other specified symptoms and signs involving the circulatory and respiratory systems: Secondary | ICD-10-CM | POA: Diagnosis not present

## 2014-10-13 DIAGNOSIS — I252 Old myocardial infarction: Secondary | ICD-10-CM | POA: Diagnosis not present

## 2014-10-13 DIAGNOSIS — I491 Atrial premature depolarization: Secondary | ICD-10-CM

## 2014-10-13 DIAGNOSIS — E7800 Pure hypercholesterolemia, unspecified: Secondary | ICD-10-CM

## 2014-10-13 DIAGNOSIS — I429 Cardiomyopathy, unspecified: Secondary | ICD-10-CM

## 2014-10-13 DIAGNOSIS — I428 Other cardiomyopathies: Secondary | ICD-10-CM | POA: Diagnosis not present

## 2014-10-13 DIAGNOSIS — I2510 Atherosclerotic heart disease of native coronary artery without angina pectoris: Secondary | ICD-10-CM

## 2014-10-13 DIAGNOSIS — Z5189 Encounter for other specified aftercare: Secondary | ICD-10-CM | POA: Diagnosis not present

## 2014-10-13 DIAGNOSIS — E785 Hyperlipidemia, unspecified: Secondary | ICD-10-CM | POA: Diagnosis not present

## 2014-10-13 DIAGNOSIS — I2582 Chronic total occlusion of coronary artery: Secondary | ICD-10-CM | POA: Diagnosis not present

## 2014-10-13 DIAGNOSIS — Z955 Presence of coronary angioplasty implant and graft: Secondary | ICD-10-CM | POA: Diagnosis not present

## 2014-10-13 NOTE — Patient Instructions (Signed)
Your physician has requested that you have a carotid duplex. This test is an ultrasound of the carotid arteries in your neck. It looks at blood flow through these arteries that supply the brain with blood. Allow one hour for this exam. There are no restrictions or special instructions.  Your physician recommends that you return for a FASTING lipid profile/liver profile in 3 months.   Your physician recommends that you schedule a follow-up appointment in: 4 months with Dr Aundra Dubin.

## 2014-10-14 NOTE — Progress Notes (Signed)
Patient ID: [name], male   DOB: August 24, 1934, 81 y.o.   MRN: [id number] PCP: [name]  81 yo with history of pituitary adenoma s/p resection, nonobstructive CAD, nonischemic cardiomyopathy, and chronic atrial bigeminy presents for cardiology followup.  Patient has a long history of atrial bigeminy.  He is in this today.  He had had a nonischemic cardiomyopathy with EF around 40% but most recent echo showed normal EF.  Historically, he has not been able to tolerate statins, and he has been tried on multiple agents.   In 11/15, he developed chest tightness and was admitted to [facility] with NSTEMI.  LHC showed 60-70% proximal LAD stenosis with totally occluded LCx.  This was opened with DES to the proximal LCx.  Echo post-cath showed EF 55-60%.  He has done well since that time.  No further chest pain.  He denies exertional dyspnea.  No orthopnea/PND, no lightheadedness.  He can only tolerate Coreg 3.125 mg 1/2 tab twice a day.  He has had difficulty with BP-active meds in the setting of adrenal insufficiency post pituitary surgery.  He is doing cardiac rehab.   He is going to be starting Repatha through Dr. Silvestre Mesi office.   ECG: NSR, bigeminal PACs  Labs (5/12): K 4.3, creatinine 1.03 Labs (2/15): K 4, creatinine 1.1, LDL 184 Labs (11/15): K 4.8, creatinine 1.07  PMH: 1. Hyperlipidemia: He has been unable to tolerate statins.  2. Chronic atrial bigeminy: x years.  3. Hypothyroidism: s/p surgery for pituitary adenoma.  4. Pituitary adenoma: s/p surgery in 3/11 for removal.  Now on thyroid replacement and hydrocortisone.  5. Cardiomyopathy: Nonischemic.  LHC (2009) with 50% mid LAD, 60% distal LAD.  Left ventriculogram (2009) with EF 40-45%.  Echo (8/13) with EF 35-40%, mild LVH.  Echo (11/14) with EF 40%, diffuse hypokinesis, normal RV.  He was tried in the past on an ACEI but apparently at that time began to have side effects from his pituitary adenoma that were thought initially to be  ACEI-related.   He has been off ACEI since that time.  He has had trouble tolerating more that 1/2 tab of 3.125 mg Coreg once a day. Echo (11/15) with EF 55-60%, aortic sclerosis without significant stenosis.  6. CAD: 2009 LHC with 50% mid, 60% distal LAD. NSTEMI 11/15 with 60-70% pLAD, totally occluded LCx with DES to proximal LCx.  EF 55-60% by LV-gram.  7. Mild cognitive impairment.   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   FH: Brother with CAD.  ROS: All systems reviewed and negative except as per HPI.   Current Outpatient Prescriptions  Medication Sig Dispense Refill  . ALPRAZolam (XANAX) 0.5 MG tablet Take 0.5 mg by mouth as needed for anxiety.     Marland Kitchen aspirin 81 MG tablet Take 81 mg by mouth daily.      . carvedilol (COREG) 3.125 MG tablet Take 0.5 tablets (1.5625 mg total) by mouth daily. 1/2 tablet two times a day 30 tablet 6  . Cinnamon 500 MG capsule Take 500 mg by mouth daily.      . Coenzyme Q10 (COQ10) 200 MG CAPS Take 1 capsule by mouth daily.      . diazepam (VALIUM) 5 MG tablet Take 5 mg by mouth as needed for anxiety.     Marland Kitchen EPIPEN 2-PAK 0.3 MG/0.3ML SOAJ injection Inject 1 Dose as directed as needed.  0  . ergocalciferol (VITAMIN D2) 50000 UNITS capsule Take 50,000 Units by mouth once  a week.      . finasteride (PROSCAR) 5 MG tablet Take 5 mg by mouth daily.      . Glucosamine Sulfate 1000 MG CAPS Take 1 capsule by mouth daily.      . hydrocortisone (CORTEF) 10 MG tablet Take 15 mg by mouth daily. One and on half tablet daily    . levothyroxine (SYNTHROID, LEVOTHROID) 150 MCG tablet Take 150 mcg by mouth daily before breakfast.    . nitroGLYCERIN (NITROSTAT) 0.4 MG SL tablet Place 1 tablet (0.4 mg total) under the tongue every 5 (five) minutes as needed for chest pain. 90 tablet 3  . Red Yeast Rice Extract 600 MG CAPS Take 600 mg by mouth daily.    . Solifenacin Succinate (VESICARE PO) Take 0.5 tablets by mouth every other day.     .  testosterone cypionate (DEPOTESTOTERONE CYPIONATE) 200 MG/ML injection Inject into the muscle every 21 ( twenty-one) days. 1.62 % injection    . ticagrelor (BRILINTA) 90 MG TABS tablet Take 1 tablet (90 mg total) by mouth 2 (two) times daily. 60 tablet 10  . WELCHOL 3.75 G PACK Take 1 packet by mouth daily.     Marland Kitchen CIALIS 5 MG tablet Take 5 mg by mouth daily.  1  . mirtazapine (REMERON) 30 MG tablet Take 1 tablet (30 mg total) by mouth at bedtime. (Patient not taking: Reported on 09/21/2014) 30 tablet 5  . rosuvastatin (CRESTOR) 5 MG tablet Take 1 tablet (5 mg total) by mouth daily. (Patient not taking: Reported on 10/13/2014) 30 tablet 3   No current facility-administered medications for this visit.    BP 120/72 mmHg  Pulse 35  Ht 6' 3.5" (1.918 m)  Wt 227 lb 1.9 oz (103.021 kg)  BMI 28.00 kg/m2 General: NAD Neck: No JVD, no thyromegaly or thyroid nodule.  Lungs: Clear to auscultation bilaterally with normal respiratory effort. CV: Nondisplaced PMI.  Heart regular S1/S2, no S3/S4, no murmur.  No edema.  Right carotid bruit.  Normal pedal pulses.  Abdomen: Soft, nontender, no hepatosplenomegaly, no distention.  Skin: Intact without lesions or rashes.  Neurologic: Alert and oriented x 3.  Psych: Normal affect. Extremities: No clubbing or cyanosis.   Assessment/Plan: 1. Cardiomyopathy: EF 40% with diffuse hypokinesis on 11/14 echo, but echo post-NSTEMI actually showed EF up to 55-60%.  He is euvolemic with NYHA class I symptoms.   2. CAD: s/p NSTEMI.  No further chest pain.  He has not tolerated multiple statins.   - Will continue ASA 81 and Brilinta 90 mg bid.  - Continue Coreg to 3.125 mg 1/2 tab bid  - He will be starting Repatha for lipid management.  - Continue cardiac rehab.  3. Hyperlipidemia: He has not tolerated multiple statins.  He will be starting Repatha soon through Dr. Silvestre Mesi office.  Check lipids/LFTs in 3 months.  4. Carotid bruit: On right.  Will arrange for carotid  dopplers.  5. Atrial bigeminy: Chronic.  EF is normal on echo.  He does not tolerate higher doses of beta blockers.   Followup in 4 months.   [name] 10/14/2014

## 2014-10-15 ENCOUNTER — Encounter (HOSPITAL_COMMUNITY)
Admission: RE | Admit: 2014-10-15 | Discharge: 2014-10-15 | Disposition: A | Discharge status: Home / Self Care | Payer: Medicare Other | Source: Ambulatory Visit | Attending: Cardiology | Admitting: Cardiology

## 2014-10-15 ENCOUNTER — Ambulatory Visit (HOSPITAL_COMMUNITY): Payer: Medicare Other | Attending: Internal Medicine | Admitting: *Deleted

## 2014-10-15 DIAGNOSIS — I428 Other cardiomyopathies: Secondary | ICD-10-CM | POA: Diagnosis not present

## 2014-10-15 DIAGNOSIS — Z955 Presence of coronary angioplasty implant and graft: Secondary | ICD-10-CM | POA: Diagnosis not present

## 2014-10-15 DIAGNOSIS — Z5189 Encounter for other specified aftercare: Secondary | ICD-10-CM | POA: Diagnosis not present

## 2014-10-15 DIAGNOSIS — E785 Hyperlipidemia, unspecified: Secondary | ICD-10-CM | POA: Insufficient documentation

## 2014-10-15 DIAGNOSIS — R0989 Other specified symptoms and signs involving the circulatory and respiratory systems: Secondary | ICD-10-CM | POA: Insufficient documentation

## 2014-10-15 DIAGNOSIS — I10 Essential (primary) hypertension: Secondary | ICD-10-CM | POA: Diagnosis not present

## 2014-10-15 DIAGNOSIS — I252 Old myocardial infarction: Secondary | ICD-10-CM | POA: Diagnosis not present

## 2014-10-15 DIAGNOSIS — I2582 Chronic total occlusion of coronary artery: Secondary | ICD-10-CM | POA: Diagnosis not present

## 2014-10-15 DIAGNOSIS — I429 Cardiomyopathy, unspecified: Secondary | ICD-10-CM | POA: Insufficient documentation

## 2014-10-15 NOTE — Progress Notes (Signed)
Carotid Duplex Performed 

## 2014-10-18 ENCOUNTER — Encounter (HOSPITAL_COMMUNITY): Payer: Medicare Other

## 2014-10-19 DIAGNOSIS — J3081 Allergic rhinitis due to animal (cat) (dog) hair and dander: Secondary | ICD-10-CM | POA: Diagnosis not present

## 2014-10-19 DIAGNOSIS — J3089 Other allergic rhinitis: Secondary | ICD-10-CM | POA: Diagnosis not present

## 2014-10-19 DIAGNOSIS — J301 Allergic rhinitis due to pollen: Secondary | ICD-10-CM | POA: Diagnosis not present

## 2014-10-20 ENCOUNTER — Encounter (HOSPITAL_COMMUNITY)
Admission: RE | Admit: 2014-10-20 | Discharge: 2014-10-20 | Disposition: A | Discharge status: Home / Self Care | Payer: Medicare Other | Source: Ambulatory Visit | Attending: Cardiology | Admitting: Cardiology

## 2014-10-20 DIAGNOSIS — Z955 Presence of coronary angioplasty implant and graft: Secondary | ICD-10-CM | POA: Diagnosis not present

## 2014-10-20 DIAGNOSIS — I2582 Chronic total occlusion of coronary artery: Secondary | ICD-10-CM | POA: Diagnosis not present

## 2014-10-20 DIAGNOSIS — Z5189 Encounter for other specified aftercare: Secondary | ICD-10-CM | POA: Diagnosis not present

## 2014-10-20 DIAGNOSIS — I252 Old myocardial infarction: Secondary | ICD-10-CM | POA: Diagnosis not present

## 2014-10-20 DIAGNOSIS — I10 Essential (primary) hypertension: Secondary | ICD-10-CM | POA: Diagnosis not present

## 2014-10-20 DIAGNOSIS — I428 Other cardiomyopathies: Secondary | ICD-10-CM | POA: Diagnosis not present

## 2014-10-22 ENCOUNTER — Encounter (HOSPITAL_COMMUNITY): Payer: Medicare Other

## 2014-10-25 ENCOUNTER — Encounter (HOSPITAL_COMMUNITY): Payer: Medicare Other

## 2014-10-25 DIAGNOSIS — J3081 Allergic rhinitis due to animal (cat) (dog) hair and dander: Secondary | ICD-10-CM | POA: Diagnosis not present

## 2014-10-25 DIAGNOSIS — J301 Allergic rhinitis due to pollen: Secondary | ICD-10-CM | POA: Diagnosis not present

## 2014-10-25 DIAGNOSIS — J3089 Other allergic rhinitis: Secondary | ICD-10-CM | POA: Diagnosis not present

## 2014-10-26 DIAGNOSIS — E291 Testicular hypofunction: Secondary | ICD-10-CM | POA: Diagnosis not present

## 2014-10-27 ENCOUNTER — Encounter (HOSPITAL_COMMUNITY)
Admission: RE | Admit: 2014-10-27 | Discharge: 2014-10-27 | Disposition: A | Discharge status: Home / Self Care | Payer: Medicare Other | Source: Ambulatory Visit | Attending: Cardiology | Admitting: Cardiology

## 2014-10-27 DIAGNOSIS — I2582 Chronic total occlusion of coronary artery: Secondary | ICD-10-CM | POA: Diagnosis not present

## 2014-10-27 DIAGNOSIS — I10 Essential (primary) hypertension: Secondary | ICD-10-CM | POA: Diagnosis not present

## 2014-10-27 DIAGNOSIS — I428 Other cardiomyopathies: Secondary | ICD-10-CM | POA: Diagnosis not present

## 2014-10-27 DIAGNOSIS — Z5189 Encounter for other specified aftercare: Secondary | ICD-10-CM | POA: Diagnosis not present

## 2014-10-27 DIAGNOSIS — Z955 Presence of coronary angioplasty implant and graft: Secondary | ICD-10-CM | POA: Diagnosis not present

## 2014-10-27 DIAGNOSIS — I252 Old myocardial infarction: Secondary | ICD-10-CM | POA: Diagnosis not present

## 2014-10-29 ENCOUNTER — Encounter (HOSPITAL_COMMUNITY): Payer: Medicare Other

## 2014-11-01 ENCOUNTER — Encounter (HOSPITAL_COMMUNITY)
Admission: RE | Admit: 2014-11-01 | Discharge: 2014-11-01 | Disposition: A | Discharge status: Home / Self Care | Payer: Medicare Other | Source: Ambulatory Visit | Attending: Cardiology | Admitting: Cardiology

## 2014-11-01 DIAGNOSIS — Z79899 Other long term (current) drug therapy: Secondary | ICD-10-CM | POA: Diagnosis not present

## 2014-11-01 DIAGNOSIS — Z8249 Family history of ischemic heart disease and other diseases of the circulatory system: Secondary | ICD-10-CM | POA: Diagnosis not present

## 2014-11-01 DIAGNOSIS — Z955 Presence of coronary angioplasty implant and graft: Secondary | ICD-10-CM | POA: Insufficient documentation

## 2014-11-01 DIAGNOSIS — Z882 Allergy status to sulfonamides status: Secondary | ICD-10-CM | POA: Insufficient documentation

## 2014-11-01 DIAGNOSIS — I2510 Atherosclerotic heart disease of native coronary artery without angina pectoris: Secondary | ICD-10-CM | POA: Diagnosis not present

## 2014-11-01 DIAGNOSIS — Z7982 Long term (current) use of aspirin: Secondary | ICD-10-CM | POA: Insufficient documentation

## 2014-11-01 DIAGNOSIS — Z5189 Encounter for other specified aftercare: Secondary | ICD-10-CM | POA: Insufficient documentation

## 2014-11-01 DIAGNOSIS — N400 Enlarged prostate without lower urinary tract symptoms: Secondary | ICD-10-CM | POA: Diagnosis not present

## 2014-11-01 DIAGNOSIS — I2582 Chronic total occlusion of coronary artery: Secondary | ICD-10-CM | POA: Diagnosis not present

## 2014-11-01 DIAGNOSIS — I428 Other cardiomyopathies: Secondary | ICD-10-CM | POA: Diagnosis not present

## 2014-11-01 DIAGNOSIS — I10 Essential (primary) hypertension: Secondary | ICD-10-CM | POA: Insufficient documentation

## 2014-11-01 DIAGNOSIS — F40240 Claustrophobia: Secondary | ICD-10-CM | POA: Insufficient documentation

## 2014-11-01 DIAGNOSIS — E785 Hyperlipidemia, unspecified: Secondary | ICD-10-CM | POA: Insufficient documentation

## 2014-11-01 DIAGNOSIS — I252 Old myocardial infarction: Secondary | ICD-10-CM | POA: Diagnosis not present

## 2014-11-03 ENCOUNTER — Encounter (HOSPITAL_COMMUNITY)
Admission: RE | Admit: 2014-11-03 | Discharge: 2014-11-03 | Disposition: A | Discharge status: Home / Self Care | Payer: Medicare Other | Source: Ambulatory Visit | Attending: Cardiology | Admitting: Cardiology

## 2014-11-03 DIAGNOSIS — Z5189 Encounter for other specified aftercare: Secondary | ICD-10-CM | POA: Diagnosis not present

## 2014-11-03 DIAGNOSIS — I252 Old myocardial infarction: Secondary | ICD-10-CM | POA: Diagnosis not present

## 2014-11-03 DIAGNOSIS — I2582 Chronic total occlusion of coronary artery: Secondary | ICD-10-CM | POA: Diagnosis not present

## 2014-11-03 DIAGNOSIS — Z955 Presence of coronary angioplasty implant and graft: Secondary | ICD-10-CM | POA: Diagnosis not present

## 2014-11-03 DIAGNOSIS — I10 Essential (primary) hypertension: Secondary | ICD-10-CM | POA: Diagnosis not present

## 2014-11-03 DIAGNOSIS — I428 Other cardiomyopathies: Secondary | ICD-10-CM | POA: Diagnosis not present

## 2014-11-03 DIAGNOSIS — N401 Enlarged prostate with lower urinary tract symptoms: Secondary | ICD-10-CM | POA: Diagnosis not present

## 2014-11-03 DIAGNOSIS — R350 Frequency of micturition: Secondary | ICD-10-CM | POA: Diagnosis not present

## 2014-11-03 DIAGNOSIS — R972 Elevated prostate specific antigen [PSA]: Secondary | ICD-10-CM | POA: Diagnosis not present

## 2014-11-04 DIAGNOSIS — J3081 Allergic rhinitis due to animal (cat) (dog) hair and dander: Secondary | ICD-10-CM | POA: Diagnosis not present

## 2014-11-04 DIAGNOSIS — J301 Allergic rhinitis due to pollen: Secondary | ICD-10-CM | POA: Diagnosis not present

## 2014-11-04 DIAGNOSIS — J3089 Other allergic rhinitis: Secondary | ICD-10-CM | POA: Diagnosis not present

## 2014-11-05 ENCOUNTER — Encounter (HOSPITAL_COMMUNITY): Payer: Medicare Other

## 2014-11-08 ENCOUNTER — Encounter (HOSPITAL_COMMUNITY)
Admission: RE | Admit: 2014-11-08 | Discharge: 2014-11-08 | Disposition: A | Discharge status: Home / Self Care | Payer: Medicare Other | Source: Ambulatory Visit | Attending: Cardiology | Admitting: Cardiology

## 2014-11-08 ENCOUNTER — Other Ambulatory Visit: Payer: Medicare Other

## 2014-11-08 DIAGNOSIS — I428 Other cardiomyopathies: Secondary | ICD-10-CM | POA: Diagnosis not present

## 2014-11-08 DIAGNOSIS — Z5189 Encounter for other specified aftercare: Secondary | ICD-10-CM | POA: Diagnosis not present

## 2014-11-08 DIAGNOSIS — I252 Old myocardial infarction: Secondary | ICD-10-CM | POA: Diagnosis not present

## 2014-11-08 DIAGNOSIS — I10 Essential (primary) hypertension: Secondary | ICD-10-CM | POA: Diagnosis not present

## 2014-11-08 DIAGNOSIS — Z955 Presence of coronary angioplasty implant and graft: Secondary | ICD-10-CM | POA: Diagnosis not present

## 2014-11-08 DIAGNOSIS — I2582 Chronic total occlusion of coronary artery: Secondary | ICD-10-CM | POA: Diagnosis not present

## 2014-11-10 ENCOUNTER — Encounter (HOSPITAL_COMMUNITY)
Admission: RE | Admit: 2014-11-10 | Discharge: 2014-11-10 | Disposition: A | Discharge status: Home / Self Care | Payer: Medicare Other | Source: Ambulatory Visit | Attending: Cardiology | Admitting: Cardiology

## 2014-11-10 DIAGNOSIS — J3081 Allergic rhinitis due to animal (cat) (dog) hair and dander: Secondary | ICD-10-CM | POA: Diagnosis not present

## 2014-11-10 DIAGNOSIS — Z5189 Encounter for other specified aftercare: Secondary | ICD-10-CM | POA: Diagnosis not present

## 2014-11-10 DIAGNOSIS — J3089 Other allergic rhinitis: Secondary | ICD-10-CM | POA: Diagnosis not present

## 2014-11-10 DIAGNOSIS — I252 Old myocardial infarction: Secondary | ICD-10-CM | POA: Diagnosis not present

## 2014-11-10 DIAGNOSIS — J301 Allergic rhinitis due to pollen: Secondary | ICD-10-CM | POA: Diagnosis not present

## 2014-11-10 DIAGNOSIS — Z955 Presence of coronary angioplasty implant and graft: Secondary | ICD-10-CM | POA: Diagnosis not present

## 2014-11-10 DIAGNOSIS — I2582 Chronic total occlusion of coronary artery: Secondary | ICD-10-CM | POA: Diagnosis not present

## 2014-11-10 DIAGNOSIS — I428 Other cardiomyopathies: Secondary | ICD-10-CM | POA: Diagnosis not present

## 2014-11-10 DIAGNOSIS — I10 Essential (primary) hypertension: Secondary | ICD-10-CM | POA: Diagnosis not present

## 2014-11-11 ENCOUNTER — Other Ambulatory Visit (INDEPENDENT_AMBULATORY_CARE_PROVIDER_SITE_OTHER): Payer: Medicare Other | Admitting: *Deleted

## 2014-11-11 DIAGNOSIS — I428 Other cardiomyopathies: Secondary | ICD-10-CM

## 2014-11-11 DIAGNOSIS — I2510 Atherosclerotic heart disease of native coronary artery without angina pectoris: Secondary | ICD-10-CM

## 2014-11-11 LAB — HEPATIC FUNCTION PANEL
ALT: 26 U/L (ref 0–53)
AST: 23 U/L (ref 0–37)
Albumin: 4 g/dL (ref 3.5–5.2)
Alkaline Phosphatase: 75 U/L (ref 39–117)
BILIRUBIN TOTAL: 0.8 mg/dL (ref 0.2–1.2)
Bilirubin, Direct: 0.1 mg/dL (ref 0.0–0.3)
Total Protein: 6.4 g/dL (ref 6.0–8.3)

## 2014-11-11 LAB — LIPID PANEL
Cholesterol: 246 mg/dL — ABNORMAL HIGH (ref 0–200)
HDL: 33 mg/dL — ABNORMAL LOW (ref >39.00)
NONHDL: 213
Total CHOL/HDL Ratio: 7
Triglycerides: 251 mg/dL — ABNORMAL HIGH (ref 0.0–149.0)
VLDL: 50.2 mg/dL — ABNORMAL HIGH (ref 0.0–40.0)

## 2014-11-11 LAB — LDL CHOLESTEROL, DIRECT: Direct LDL: 155 mg/dL

## 2014-11-11 NOTE — Addendum Note (Signed)
Addended by: Eulis Foster on: 11/11/2014 08:49 AM   Modules accepted: Orders

## 2014-11-12 ENCOUNTER — Encounter (HOSPITAL_COMMUNITY): Payer: Medicare Other

## 2014-11-15 ENCOUNTER — Encounter (HOSPITAL_COMMUNITY): Payer: Medicare Other

## 2014-11-16 DIAGNOSIS — E291 Testicular hypofunction: Secondary | ICD-10-CM | POA: Diagnosis not present

## 2014-11-16 DIAGNOSIS — J301 Allergic rhinitis due to pollen: Secondary | ICD-10-CM | POA: Diagnosis not present

## 2014-11-16 DIAGNOSIS — J3089 Other allergic rhinitis: Secondary | ICD-10-CM | POA: Diagnosis not present

## 2014-11-16 DIAGNOSIS — J3081 Allergic rhinitis due to animal (cat) (dog) hair and dander: Secondary | ICD-10-CM | POA: Diagnosis not present

## 2014-11-17 ENCOUNTER — Encounter (HOSPITAL_COMMUNITY)
Admission: RE | Admit: 2014-11-17 | Discharge: 2014-11-17 | Disposition: A | Discharge status: Home / Self Care | Payer: Medicare Other | Source: Ambulatory Visit | Attending: Cardiology | Admitting: Cardiology

## 2014-11-17 DIAGNOSIS — I2582 Chronic total occlusion of coronary artery: Secondary | ICD-10-CM | POA: Diagnosis not present

## 2014-11-17 DIAGNOSIS — I10 Essential (primary) hypertension: Secondary | ICD-10-CM | POA: Diagnosis not present

## 2014-11-17 DIAGNOSIS — Z955 Presence of coronary angioplasty implant and graft: Secondary | ICD-10-CM | POA: Diagnosis not present

## 2014-11-17 DIAGNOSIS — I428 Other cardiomyopathies: Secondary | ICD-10-CM | POA: Diagnosis not present

## 2014-11-17 DIAGNOSIS — Z5189 Encounter for other specified aftercare: Secondary | ICD-10-CM | POA: Diagnosis not present

## 2014-11-17 DIAGNOSIS — I252 Old myocardial infarction: Secondary | ICD-10-CM | POA: Diagnosis not present

## 2014-11-19 ENCOUNTER — Encounter (HOSPITAL_COMMUNITY): Payer: Medicare Other

## 2014-11-22 ENCOUNTER — Encounter (HOSPITAL_COMMUNITY)
Admission: RE | Admit: 2014-11-22 | Discharge: 2014-11-22 | Disposition: A | Discharge status: Home / Self Care | Payer: Medicare Other | Source: Ambulatory Visit | Attending: Cardiology | Admitting: Cardiology

## 2014-11-22 DIAGNOSIS — I2582 Chronic total occlusion of coronary artery: Secondary | ICD-10-CM | POA: Diagnosis not present

## 2014-11-22 DIAGNOSIS — I252 Old myocardial infarction: Secondary | ICD-10-CM | POA: Diagnosis not present

## 2014-11-22 DIAGNOSIS — Z955 Presence of coronary angioplasty implant and graft: Secondary | ICD-10-CM | POA: Diagnosis not present

## 2014-11-22 DIAGNOSIS — Z5189 Encounter for other specified aftercare: Secondary | ICD-10-CM | POA: Diagnosis not present

## 2014-11-22 DIAGNOSIS — I10 Essential (primary) hypertension: Secondary | ICD-10-CM | POA: Diagnosis not present

## 2014-11-22 DIAGNOSIS — I428 Other cardiomyopathies: Secondary | ICD-10-CM | POA: Diagnosis not present

## 2014-11-23 DIAGNOSIS — J3081 Allergic rhinitis due to animal (cat) (dog) hair and dander: Secondary | ICD-10-CM | POA: Diagnosis not present

## 2014-11-23 DIAGNOSIS — J3089 Other allergic rhinitis: Secondary | ICD-10-CM | POA: Diagnosis not present

## 2014-11-23 DIAGNOSIS — J301 Allergic rhinitis due to pollen: Secondary | ICD-10-CM | POA: Diagnosis not present

## 2014-11-24 ENCOUNTER — Encounter (HOSPITAL_COMMUNITY)
Admission: RE | Admit: 2014-11-24 | Discharge: 2014-11-24 | Disposition: A | Discharge status: Home / Self Care | Payer: Medicare Other | Source: Ambulatory Visit | Attending: Cardiology | Admitting: Cardiology

## 2014-11-24 DIAGNOSIS — I252 Old myocardial infarction: Secondary | ICD-10-CM | POA: Diagnosis not present

## 2014-11-24 DIAGNOSIS — Z5189 Encounter for other specified aftercare: Secondary | ICD-10-CM | POA: Diagnosis not present

## 2014-11-24 DIAGNOSIS — I428 Other cardiomyopathies: Secondary | ICD-10-CM | POA: Diagnosis not present

## 2014-11-24 DIAGNOSIS — I2582 Chronic total occlusion of coronary artery: Secondary | ICD-10-CM | POA: Diagnosis not present

## 2014-11-24 DIAGNOSIS — Z955 Presence of coronary angioplasty implant and graft: Secondary | ICD-10-CM | POA: Diagnosis not present

## 2014-11-24 DIAGNOSIS — I10 Essential (primary) hypertension: Secondary | ICD-10-CM | POA: Diagnosis not present

## 2014-11-25 ENCOUNTER — Other Ambulatory Visit: Payer: Self-pay

## 2014-11-25 ENCOUNTER — Telehealth: Payer: Self-pay

## 2014-11-25 MED ORDER — TADALAFIL 5 MG PO TABS
ORAL_TABLET | ORAL | Status: DC
Start: 2014-11-25 — End: 2015-07-29

## 2014-11-25 NOTE — Telephone Encounter (Signed)
Yes that is fine

## 2014-11-26 ENCOUNTER — Encounter (HOSPITAL_COMMUNITY): Payer: Medicare Other

## 2014-11-29 ENCOUNTER — Encounter (HOSPITAL_COMMUNITY): Payer: Medicare Other

## 2014-11-30 DIAGNOSIS — J3081 Allergic rhinitis due to animal (cat) (dog) hair and dander: Secondary | ICD-10-CM | POA: Diagnosis not present

## 2014-11-30 DIAGNOSIS — J301 Allergic rhinitis due to pollen: Secondary | ICD-10-CM | POA: Diagnosis not present

## 2014-11-30 DIAGNOSIS — J3089 Other allergic rhinitis: Secondary | ICD-10-CM | POA: Diagnosis not present

## 2014-12-01 ENCOUNTER — Encounter (HOSPITAL_COMMUNITY)
Admission: RE | Admit: 2014-12-01 | Discharge: 2014-12-01 | Disposition: A | Discharge status: Home / Self Care | Payer: Medicare Other | Source: Ambulatory Visit | Attending: Cardiology | Admitting: Cardiology

## 2014-12-01 DIAGNOSIS — Z79899 Other long term (current) drug therapy: Secondary | ICD-10-CM | POA: Diagnosis not present

## 2014-12-01 DIAGNOSIS — I252 Old myocardial infarction: Secondary | ICD-10-CM | POA: Insufficient documentation

## 2014-12-01 DIAGNOSIS — N400 Enlarged prostate without lower urinary tract symptoms: Secondary | ICD-10-CM | POA: Insufficient documentation

## 2014-12-01 DIAGNOSIS — E785 Hyperlipidemia, unspecified: Secondary | ICD-10-CM | POA: Diagnosis not present

## 2014-12-01 DIAGNOSIS — Z5189 Encounter for other specified aftercare: Secondary | ICD-10-CM | POA: Insufficient documentation

## 2014-12-01 DIAGNOSIS — F40240 Claustrophobia: Secondary | ICD-10-CM | POA: Diagnosis not present

## 2014-12-01 DIAGNOSIS — I2510 Atherosclerotic heart disease of native coronary artery without angina pectoris: Secondary | ICD-10-CM | POA: Insufficient documentation

## 2014-12-01 DIAGNOSIS — I2582 Chronic total occlusion of coronary artery: Secondary | ICD-10-CM | POA: Insufficient documentation

## 2014-12-01 DIAGNOSIS — Z7982 Long term (current) use of aspirin: Secondary | ICD-10-CM | POA: Diagnosis not present

## 2014-12-01 DIAGNOSIS — Z8249 Family history of ischemic heart disease and other diseases of the circulatory system: Secondary | ICD-10-CM | POA: Diagnosis not present

## 2014-12-01 DIAGNOSIS — I428 Other cardiomyopathies: Secondary | ICD-10-CM | POA: Diagnosis not present

## 2014-12-01 DIAGNOSIS — I10 Essential (primary) hypertension: Secondary | ICD-10-CM | POA: Insufficient documentation

## 2014-12-01 DIAGNOSIS — Z882 Allergy status to sulfonamides status: Secondary | ICD-10-CM | POA: Diagnosis not present

## 2014-12-01 DIAGNOSIS — Z955 Presence of coronary angioplasty implant and graft: Secondary | ICD-10-CM | POA: Diagnosis not present

## 2014-12-02 DIAGNOSIS — E039 Hypothyroidism, unspecified: Secondary | ICD-10-CM | POA: Diagnosis not present

## 2014-12-02 DIAGNOSIS — I2510 Atherosclerotic heart disease of native coronary artery without angina pectoris: Secondary | ICD-10-CM | POA: Diagnosis not present

## 2014-12-02 DIAGNOSIS — E559 Vitamin D deficiency, unspecified: Secondary | ICD-10-CM | POA: Diagnosis not present

## 2014-12-02 DIAGNOSIS — E785 Hyperlipidemia, unspecified: Secondary | ICD-10-CM | POA: Diagnosis not present

## 2014-12-02 DIAGNOSIS — Z125 Encounter for screening for malignant neoplasm of prostate: Secondary | ICD-10-CM | POA: Diagnosis not present

## 2014-12-02 DIAGNOSIS — Z008 Encounter for other general examination: Secondary | ICD-10-CM | POA: Diagnosis not present

## 2014-12-02 DIAGNOSIS — R7301 Impaired fasting glucose: Secondary | ICD-10-CM | POA: Diagnosis not present

## 2014-12-03 ENCOUNTER — Encounter (HOSPITAL_COMMUNITY)
Admission: RE | Admit: 2014-12-03 | Discharge: 2014-12-03 | Disposition: A | Discharge status: Home / Self Care | Payer: Medicare Other | Source: Ambulatory Visit | Attending: Cardiology | Admitting: Cardiology

## 2014-12-03 DIAGNOSIS — I252 Old myocardial infarction: Secondary | ICD-10-CM | POA: Diagnosis not present

## 2014-12-03 DIAGNOSIS — Z955 Presence of coronary angioplasty implant and graft: Secondary | ICD-10-CM | POA: Diagnosis not present

## 2014-12-03 DIAGNOSIS — Z5189 Encounter for other specified aftercare: Secondary | ICD-10-CM | POA: Diagnosis not present

## 2014-12-03 DIAGNOSIS — I428 Other cardiomyopathies: Secondary | ICD-10-CM | POA: Diagnosis not present

## 2014-12-03 DIAGNOSIS — I10 Essential (primary) hypertension: Secondary | ICD-10-CM | POA: Diagnosis not present

## 2014-12-03 DIAGNOSIS — I2582 Chronic total occlusion of coronary artery: Secondary | ICD-10-CM | POA: Diagnosis not present

## 2014-12-03 NOTE — Progress Notes (Signed)
Zyhir Cappella Dyal 81 y.o. male Nutrition Note Spoke with pt.  Nutrition Survey reviewed with pt. Pt is not currently following the Therapeutic Lifestyle Changes diet. Pt reports he has made changes to his diet (e.g. Eating oatmeal, not eating bacon/sausage/eggs frequently, eating more poultry, fruit and vegetables). Pt eats out frequently (e.g. Janine Limbo, Mrs. Berle Mull). Ways to improve dietary choices at home and when eating out discussed. Pt wants to maintain his wt. Per discussion, pt unintentionally lost wt and his PCP wants him to maintain his current wt at this time. Pt expressed understanding of the information reviewed. Pt aware of nutrition education classes offered.  Nutrition Diagnosis ? Food-and nutrition-related knowledge deficit related to lack of exposure to information as related to diagnosis of: ? CVD   Nutrition Intervention ? Benefits of adopting Therapeutic Lifestyle Changes discussed when Medficts reviewed. ? Pt to attend the Portion Distortion class - met; 11/03/14 and 12/01/14 ? Pt given handouts for: ? Nutrition I class ? Nutrition II class  ? Continue client-centered nutrition education by RD, as part of interdisciplinary care.  Goal(s) ? Pt to make good choices when eating out at restaurants. ? Pt to identify and limit food sources of saturated fat, trans fat, and cholesterol  Monitor and Evaluate progress toward nutrition goal with team.  Derek Mound, M.Ed, RD, LDN, CDE 12/03/2014 10:45 AM

## 2014-12-06 ENCOUNTER — Encounter (HOSPITAL_COMMUNITY)
Admission: RE | Admit: 2014-12-06 | Discharge: 2014-12-06 | Disposition: A | Discharge status: Home / Self Care | Payer: Medicare Other | Source: Ambulatory Visit | Attending: Cardiology | Admitting: Cardiology

## 2014-12-06 DIAGNOSIS — J301 Allergic rhinitis due to pollen: Secondary | ICD-10-CM | POA: Diagnosis not present

## 2014-12-06 DIAGNOSIS — I428 Other cardiomyopathies: Secondary | ICD-10-CM | POA: Diagnosis not present

## 2014-12-06 DIAGNOSIS — I252 Old myocardial infarction: Secondary | ICD-10-CM | POA: Diagnosis not present

## 2014-12-06 DIAGNOSIS — I2582 Chronic total occlusion of coronary artery: Secondary | ICD-10-CM | POA: Diagnosis not present

## 2014-12-06 DIAGNOSIS — J3081 Allergic rhinitis due to animal (cat) (dog) hair and dander: Secondary | ICD-10-CM | POA: Diagnosis not present

## 2014-12-06 DIAGNOSIS — Z955 Presence of coronary angioplasty implant and graft: Secondary | ICD-10-CM | POA: Diagnosis not present

## 2014-12-06 DIAGNOSIS — I10 Essential (primary) hypertension: Secondary | ICD-10-CM | POA: Diagnosis not present

## 2014-12-06 DIAGNOSIS — J3089 Other allergic rhinitis: Secondary | ICD-10-CM | POA: Diagnosis not present

## 2014-12-06 DIAGNOSIS — Z5189 Encounter for other specified aftercare: Secondary | ICD-10-CM | POA: Diagnosis not present

## 2014-12-08 ENCOUNTER — Encounter (HOSPITAL_COMMUNITY)
Admission: RE | Admit: 2014-12-08 | Discharge: 2014-12-08 | Disposition: A | Discharge status: Home / Self Care | Payer: Medicare Other | Source: Ambulatory Visit | Attending: Cardiology | Admitting: Cardiology

## 2014-12-08 DIAGNOSIS — I428 Other cardiomyopathies: Secondary | ICD-10-CM | POA: Diagnosis not present

## 2014-12-08 DIAGNOSIS — Z5189 Encounter for other specified aftercare: Secondary | ICD-10-CM | POA: Diagnosis not present

## 2014-12-08 DIAGNOSIS — I252 Old myocardial infarction: Secondary | ICD-10-CM | POA: Diagnosis not present

## 2014-12-08 DIAGNOSIS — I10 Essential (primary) hypertension: Secondary | ICD-10-CM | POA: Diagnosis not present

## 2014-12-08 DIAGNOSIS — I2582 Chronic total occlusion of coronary artery: Secondary | ICD-10-CM | POA: Diagnosis not present

## 2014-12-08 DIAGNOSIS — Z955 Presence of coronary angioplasty implant and graft: Secondary | ICD-10-CM | POA: Diagnosis not present

## 2014-12-09 DIAGNOSIS — R7301 Impaired fasting glucose: Secondary | ICD-10-CM | POA: Diagnosis not present

## 2014-12-09 DIAGNOSIS — G4700 Insomnia, unspecified: Secondary | ICD-10-CM | POA: Diagnosis not present

## 2014-12-09 DIAGNOSIS — Z0000 Encounter for general adult medical examination without abnormal findings: Secondary | ICD-10-CM | POA: Diagnosis not present

## 2014-12-09 DIAGNOSIS — E039 Hypothyroidism, unspecified: Secondary | ICD-10-CM | POA: Diagnosis not present

## 2014-12-09 DIAGNOSIS — Z1212 Encounter for screening for malignant neoplasm of rectum: Secondary | ICD-10-CM | POA: Diagnosis not present

## 2014-12-09 DIAGNOSIS — E291 Testicular hypofunction: Secondary | ICD-10-CM | POA: Diagnosis not present

## 2014-12-09 DIAGNOSIS — F419 Anxiety disorder, unspecified: Secondary | ICD-10-CM | POA: Diagnosis not present

## 2014-12-09 DIAGNOSIS — R413 Other amnesia: Secondary | ICD-10-CM | POA: Diagnosis not present

## 2014-12-09 DIAGNOSIS — E559 Vitamin D deficiency, unspecified: Secondary | ICD-10-CM | POA: Diagnosis not present

## 2014-12-09 DIAGNOSIS — E230 Hypopituitarism: Secondary | ICD-10-CM | POA: Diagnosis not present

## 2014-12-09 DIAGNOSIS — M1990 Unspecified osteoarthritis, unspecified site: Secondary | ICD-10-CM | POA: Diagnosis not present

## 2014-12-09 DIAGNOSIS — Z6829 Body mass index (BMI) 29.0-29.9, adult: Secondary | ICD-10-CM | POA: Diagnosis not present

## 2014-12-10 ENCOUNTER — Encounter (HOSPITAL_COMMUNITY)
Admission: RE | Admit: 2014-12-10 | Discharge: 2014-12-10 | Disposition: A | Discharge status: Home / Self Care | Payer: Medicare Other | Source: Ambulatory Visit | Attending: Cardiology | Admitting: Cardiology

## 2014-12-10 ENCOUNTER — Encounter: Payer: Self-pay | Admitting: Cardiology

## 2014-12-10 DIAGNOSIS — I428 Other cardiomyopathies: Secondary | ICD-10-CM | POA: Diagnosis not present

## 2014-12-10 DIAGNOSIS — I252 Old myocardial infarction: Secondary | ICD-10-CM | POA: Diagnosis not present

## 2014-12-10 DIAGNOSIS — I2582 Chronic total occlusion of coronary artery: Secondary | ICD-10-CM | POA: Diagnosis not present

## 2014-12-10 DIAGNOSIS — Z955 Presence of coronary angioplasty implant and graft: Secondary | ICD-10-CM | POA: Diagnosis not present

## 2014-12-10 DIAGNOSIS — I10 Essential (primary) hypertension: Secondary | ICD-10-CM | POA: Diagnosis not present

## 2014-12-10 DIAGNOSIS — Z5189 Encounter for other specified aftercare: Secondary | ICD-10-CM | POA: Diagnosis not present

## 2014-12-13 ENCOUNTER — Encounter (HOSPITAL_COMMUNITY)
Admission: RE | Admit: 2014-12-13 | Discharge: 2014-12-13 | Disposition: A | Discharge status: Home / Self Care | Payer: Medicare Other | Source: Ambulatory Visit | Attending: Cardiology | Admitting: Cardiology

## 2014-12-13 DIAGNOSIS — Z5189 Encounter for other specified aftercare: Secondary | ICD-10-CM | POA: Diagnosis not present

## 2014-12-13 DIAGNOSIS — I10 Essential (primary) hypertension: Secondary | ICD-10-CM | POA: Diagnosis not present

## 2014-12-13 DIAGNOSIS — J3081 Allergic rhinitis due to animal (cat) (dog) hair and dander: Secondary | ICD-10-CM | POA: Diagnosis not present

## 2014-12-13 DIAGNOSIS — Z955 Presence of coronary angioplasty implant and graft: Secondary | ICD-10-CM | POA: Diagnosis not present

## 2014-12-13 DIAGNOSIS — I2582 Chronic total occlusion of coronary artery: Secondary | ICD-10-CM | POA: Diagnosis not present

## 2014-12-13 DIAGNOSIS — I252 Old myocardial infarction: Secondary | ICD-10-CM | POA: Diagnosis not present

## 2014-12-13 DIAGNOSIS — J3089 Other allergic rhinitis: Secondary | ICD-10-CM | POA: Diagnosis not present

## 2014-12-13 DIAGNOSIS — J301 Allergic rhinitis due to pollen: Secondary | ICD-10-CM | POA: Diagnosis not present

## 2014-12-13 DIAGNOSIS — I428 Other cardiomyopathies: Secondary | ICD-10-CM | POA: Diagnosis not present

## 2014-12-15 ENCOUNTER — Encounter (HOSPITAL_COMMUNITY)
Admission: RE | Admit: 2014-12-15 | Discharge: 2014-12-15 | Disposition: A | Discharge status: Home / Self Care | Payer: Medicare Other | Source: Ambulatory Visit | Attending: Cardiology | Admitting: Cardiology

## 2014-12-15 DIAGNOSIS — Z5189 Encounter for other specified aftercare: Secondary | ICD-10-CM | POA: Diagnosis not present

## 2014-12-15 DIAGNOSIS — Z955 Presence of coronary angioplasty implant and graft: Secondary | ICD-10-CM | POA: Diagnosis not present

## 2014-12-15 DIAGNOSIS — I252 Old myocardial infarction: Secondary | ICD-10-CM | POA: Diagnosis not present

## 2014-12-15 DIAGNOSIS — I428 Other cardiomyopathies: Secondary | ICD-10-CM | POA: Diagnosis not present

## 2014-12-15 DIAGNOSIS — I10 Essential (primary) hypertension: Secondary | ICD-10-CM | POA: Diagnosis not present

## 2014-12-15 DIAGNOSIS — I2582 Chronic total occlusion of coronary artery: Secondary | ICD-10-CM | POA: Diagnosis not present

## 2014-12-17 ENCOUNTER — Encounter (HOSPITAL_COMMUNITY): Payer: Medicare Other

## 2014-12-20 ENCOUNTER — Encounter (HOSPITAL_COMMUNITY)
Admission: RE | Admit: 2014-12-20 | Discharge: 2014-12-20 | Disposition: A | Discharge status: Home / Self Care | Payer: Medicare Other | Source: Ambulatory Visit | Attending: Cardiology | Admitting: Cardiology

## 2014-12-20 DIAGNOSIS — I2582 Chronic total occlusion of coronary artery: Secondary | ICD-10-CM | POA: Diagnosis not present

## 2014-12-20 DIAGNOSIS — Z955 Presence of coronary angioplasty implant and graft: Secondary | ICD-10-CM | POA: Diagnosis not present

## 2014-12-20 DIAGNOSIS — Z5189 Encounter for other specified aftercare: Secondary | ICD-10-CM | POA: Diagnosis not present

## 2014-12-20 DIAGNOSIS — I428 Other cardiomyopathies: Secondary | ICD-10-CM | POA: Diagnosis not present

## 2014-12-20 DIAGNOSIS — I252 Old myocardial infarction: Secondary | ICD-10-CM | POA: Diagnosis not present

## 2014-12-20 DIAGNOSIS — I10 Essential (primary) hypertension: Secondary | ICD-10-CM | POA: Diagnosis not present

## 2014-12-21 DIAGNOSIS — J301 Allergic rhinitis due to pollen: Secondary | ICD-10-CM | POA: Diagnosis not present

## 2014-12-21 DIAGNOSIS — J3081 Allergic rhinitis due to animal (cat) (dog) hair and dander: Secondary | ICD-10-CM | POA: Diagnosis not present

## 2014-12-21 DIAGNOSIS — J3089 Other allergic rhinitis: Secondary | ICD-10-CM | POA: Diagnosis not present

## 2014-12-22 ENCOUNTER — Encounter (HOSPITAL_COMMUNITY)
Admission: RE | Admit: 2014-12-22 | Discharge: 2014-12-22 | Disposition: A | Discharge status: Home / Self Care | Payer: Medicare Other | Source: Ambulatory Visit | Attending: Cardiology | Admitting: Cardiology

## 2014-12-22 DIAGNOSIS — Z5189 Encounter for other specified aftercare: Secondary | ICD-10-CM | POA: Diagnosis not present

## 2014-12-22 DIAGNOSIS — Z955 Presence of coronary angioplasty implant and graft: Secondary | ICD-10-CM | POA: Diagnosis not present

## 2014-12-22 DIAGNOSIS — I2582 Chronic total occlusion of coronary artery: Secondary | ICD-10-CM | POA: Diagnosis not present

## 2014-12-22 DIAGNOSIS — I252 Old myocardial infarction: Secondary | ICD-10-CM | POA: Diagnosis not present

## 2014-12-22 DIAGNOSIS — I10 Essential (primary) hypertension: Secondary | ICD-10-CM | POA: Diagnosis not present

## 2014-12-22 DIAGNOSIS — I428 Other cardiomyopathies: Secondary | ICD-10-CM | POA: Diagnosis not present

## 2014-12-24 ENCOUNTER — Encounter (HOSPITAL_COMMUNITY)
Admission: RE | Admit: 2014-12-24 | Discharge: 2014-12-24 | Disposition: A | Discharge status: Home / Self Care | Payer: Medicare Other | Source: Ambulatory Visit | Attending: Cardiology | Admitting: Cardiology

## 2014-12-24 DIAGNOSIS — Z5189 Encounter for other specified aftercare: Secondary | ICD-10-CM | POA: Diagnosis not present

## 2014-12-24 DIAGNOSIS — I2582 Chronic total occlusion of coronary artery: Secondary | ICD-10-CM | POA: Diagnosis not present

## 2014-12-24 DIAGNOSIS — Z955 Presence of coronary angioplasty implant and graft: Secondary | ICD-10-CM | POA: Diagnosis not present

## 2014-12-24 DIAGNOSIS — I252 Old myocardial infarction: Secondary | ICD-10-CM | POA: Diagnosis not present

## 2014-12-24 DIAGNOSIS — I10 Essential (primary) hypertension: Secondary | ICD-10-CM | POA: Diagnosis not present

## 2014-12-24 DIAGNOSIS — I428 Other cardiomyopathies: Secondary | ICD-10-CM | POA: Diagnosis not present

## 2014-12-27 ENCOUNTER — Encounter (HOSPITAL_COMMUNITY)
Admission: RE | Admit: 2014-12-27 | Discharge: 2014-12-27 | Disposition: A | Discharge status: Home / Self Care | Payer: Medicare Other | Source: Ambulatory Visit | Attending: Cardiology | Admitting: Cardiology

## 2014-12-27 DIAGNOSIS — I428 Other cardiomyopathies: Secondary | ICD-10-CM | POA: Diagnosis not present

## 2014-12-27 DIAGNOSIS — I2582 Chronic total occlusion of coronary artery: Secondary | ICD-10-CM | POA: Diagnosis not present

## 2014-12-27 DIAGNOSIS — I252 Old myocardial infarction: Secondary | ICD-10-CM | POA: Diagnosis not present

## 2014-12-27 DIAGNOSIS — J3089 Other allergic rhinitis: Secondary | ICD-10-CM | POA: Diagnosis not present

## 2014-12-27 DIAGNOSIS — I10 Essential (primary) hypertension: Secondary | ICD-10-CM | POA: Diagnosis not present

## 2014-12-27 DIAGNOSIS — J301 Allergic rhinitis due to pollen: Secondary | ICD-10-CM | POA: Diagnosis not present

## 2014-12-27 DIAGNOSIS — Z5189 Encounter for other specified aftercare: Secondary | ICD-10-CM | POA: Diagnosis not present

## 2014-12-27 DIAGNOSIS — J3081 Allergic rhinitis due to animal (cat) (dog) hair and dander: Secondary | ICD-10-CM | POA: Diagnosis not present

## 2014-12-27 DIAGNOSIS — Z955 Presence of coronary angioplasty implant and graft: Secondary | ICD-10-CM | POA: Diagnosis not present

## 2014-12-28 DIAGNOSIS — E291 Testicular hypofunction: Secondary | ICD-10-CM | POA: Diagnosis not present

## 2014-12-29 ENCOUNTER — Encounter (HOSPITAL_COMMUNITY)
Admission: RE | Admit: 2014-12-29 | Discharge: 2014-12-29 | Disposition: A | Discharge status: Home / Self Care | Payer: Medicare Other | Source: Ambulatory Visit | Attending: Cardiology | Admitting: Cardiology

## 2014-12-29 DIAGNOSIS — I252 Old myocardial infarction: Secondary | ICD-10-CM | POA: Diagnosis not present

## 2014-12-29 DIAGNOSIS — Z5189 Encounter for other specified aftercare: Secondary | ICD-10-CM | POA: Diagnosis not present

## 2014-12-29 DIAGNOSIS — I10 Essential (primary) hypertension: Secondary | ICD-10-CM | POA: Diagnosis not present

## 2014-12-29 DIAGNOSIS — I428 Other cardiomyopathies: Secondary | ICD-10-CM | POA: Diagnosis not present

## 2014-12-29 DIAGNOSIS — I2582 Chronic total occlusion of coronary artery: Secondary | ICD-10-CM | POA: Diagnosis not present

## 2014-12-29 DIAGNOSIS — Z955 Presence of coronary angioplasty implant and graft: Secondary | ICD-10-CM | POA: Diagnosis not present

## 2014-12-31 ENCOUNTER — Encounter (HOSPITAL_COMMUNITY)
Admission: RE | Admit: 2014-12-31 | Discharge: 2014-12-31 | Disposition: A | Discharge status: Home / Self Care | Payer: Medicare Other | Source: Ambulatory Visit | Attending: Cardiology | Admitting: Cardiology

## 2014-12-31 DIAGNOSIS — I252 Old myocardial infarction: Secondary | ICD-10-CM | POA: Diagnosis not present

## 2014-12-31 DIAGNOSIS — Z79899 Other long term (current) drug therapy: Secondary | ICD-10-CM | POA: Diagnosis not present

## 2014-12-31 DIAGNOSIS — Z955 Presence of coronary angioplasty implant and graft: Secondary | ICD-10-CM | POA: Insufficient documentation

## 2014-12-31 DIAGNOSIS — Z882 Allergy status to sulfonamides status: Secondary | ICD-10-CM | POA: Diagnosis not present

## 2014-12-31 DIAGNOSIS — F40240 Claustrophobia: Secondary | ICD-10-CM | POA: Diagnosis not present

## 2014-12-31 DIAGNOSIS — E785 Hyperlipidemia, unspecified: Secondary | ICD-10-CM | POA: Diagnosis not present

## 2014-12-31 DIAGNOSIS — I2582 Chronic total occlusion of coronary artery: Secondary | ICD-10-CM | POA: Insufficient documentation

## 2014-12-31 DIAGNOSIS — I2510 Atherosclerotic heart disease of native coronary artery without angina pectoris: Secondary | ICD-10-CM | POA: Diagnosis not present

## 2014-12-31 DIAGNOSIS — Z8249 Family history of ischemic heart disease and other diseases of the circulatory system: Secondary | ICD-10-CM | POA: Diagnosis not present

## 2014-12-31 DIAGNOSIS — I428 Other cardiomyopathies: Secondary | ICD-10-CM | POA: Diagnosis not present

## 2014-12-31 DIAGNOSIS — Z7982 Long term (current) use of aspirin: Secondary | ICD-10-CM | POA: Diagnosis not present

## 2014-12-31 DIAGNOSIS — Z5189 Encounter for other specified aftercare: Secondary | ICD-10-CM | POA: Insufficient documentation

## 2014-12-31 DIAGNOSIS — I10 Essential (primary) hypertension: Secondary | ICD-10-CM | POA: Diagnosis not present

## 2014-12-31 DIAGNOSIS — N400 Enlarged prostate without lower urinary tract symptoms: Secondary | ICD-10-CM | POA: Insufficient documentation

## 2015-01-03 ENCOUNTER — Encounter (HOSPITAL_COMMUNITY)
Admission: RE | Admit: 2015-01-03 | Discharge: 2015-01-03 | Disposition: A | Discharge status: Home / Self Care | Payer: Medicare Other | Source: Ambulatory Visit | Attending: Cardiology | Admitting: Cardiology

## 2015-01-03 DIAGNOSIS — I2582 Chronic total occlusion of coronary artery: Secondary | ICD-10-CM | POA: Diagnosis not present

## 2015-01-03 DIAGNOSIS — J3089 Other allergic rhinitis: Secondary | ICD-10-CM | POA: Diagnosis not present

## 2015-01-03 DIAGNOSIS — J3081 Allergic rhinitis due to animal (cat) (dog) hair and dander: Secondary | ICD-10-CM | POA: Diagnosis not present

## 2015-01-03 DIAGNOSIS — Z955 Presence of coronary angioplasty implant and graft: Secondary | ICD-10-CM | POA: Diagnosis not present

## 2015-01-03 DIAGNOSIS — Z5189 Encounter for other specified aftercare: Secondary | ICD-10-CM | POA: Diagnosis not present

## 2015-01-03 DIAGNOSIS — I252 Old myocardial infarction: Secondary | ICD-10-CM | POA: Diagnosis not present

## 2015-01-03 DIAGNOSIS — J301 Allergic rhinitis due to pollen: Secondary | ICD-10-CM | POA: Diagnosis not present

## 2015-01-03 DIAGNOSIS — I10 Essential (primary) hypertension: Secondary | ICD-10-CM | POA: Diagnosis not present

## 2015-01-03 DIAGNOSIS — I428 Other cardiomyopathies: Secondary | ICD-10-CM | POA: Diagnosis not present

## 2015-01-05 ENCOUNTER — Encounter (HOSPITAL_COMMUNITY)
Admission: RE | Admit: 2015-01-05 | Discharge: 2015-01-05 | Disposition: A | Discharge status: Home / Self Care | Payer: Medicare Other | Source: Ambulatory Visit | Attending: Cardiology | Admitting: Cardiology

## 2015-01-05 DIAGNOSIS — Z955 Presence of coronary angioplasty implant and graft: Secondary | ICD-10-CM | POA: Diagnosis not present

## 2015-01-05 DIAGNOSIS — Z5189 Encounter for other specified aftercare: Secondary | ICD-10-CM | POA: Diagnosis not present

## 2015-01-05 DIAGNOSIS — I252 Old myocardial infarction: Secondary | ICD-10-CM | POA: Diagnosis not present

## 2015-01-05 DIAGNOSIS — I10 Essential (primary) hypertension: Secondary | ICD-10-CM | POA: Diagnosis not present

## 2015-01-05 DIAGNOSIS — I2582 Chronic total occlusion of coronary artery: Secondary | ICD-10-CM | POA: Diagnosis not present

## 2015-01-05 DIAGNOSIS — I428 Other cardiomyopathies: Secondary | ICD-10-CM | POA: Diagnosis not present

## 2015-01-07 ENCOUNTER — Encounter (HOSPITAL_COMMUNITY)
Admission: RE | Admit: 2015-01-07 | Discharge: 2015-01-07 | Disposition: A | Discharge status: Home / Self Care | Payer: Medicare Other | Source: Ambulatory Visit | Attending: Cardiology | Admitting: Cardiology

## 2015-01-07 DIAGNOSIS — I10 Essential (primary) hypertension: Secondary | ICD-10-CM | POA: Diagnosis not present

## 2015-01-07 DIAGNOSIS — Z5189 Encounter for other specified aftercare: Secondary | ICD-10-CM | POA: Diagnosis not present

## 2015-01-07 DIAGNOSIS — I428 Other cardiomyopathies: Secondary | ICD-10-CM | POA: Diagnosis not present

## 2015-01-07 DIAGNOSIS — I2582 Chronic total occlusion of coronary artery: Secondary | ICD-10-CM | POA: Diagnosis not present

## 2015-01-07 DIAGNOSIS — I252 Old myocardial infarction: Secondary | ICD-10-CM | POA: Diagnosis not present

## 2015-01-07 DIAGNOSIS — Z955 Presence of coronary angioplasty implant and graft: Secondary | ICD-10-CM | POA: Diagnosis not present

## 2015-01-10 ENCOUNTER — Encounter (HOSPITAL_COMMUNITY)
Admission: RE | Admit: 2015-01-10 | Discharge: 2015-01-10 | Disposition: A | Discharge status: Home / Self Care | Payer: Medicare Other | Source: Ambulatory Visit | Attending: Cardiology | Admitting: Cardiology

## 2015-01-10 DIAGNOSIS — I428 Other cardiomyopathies: Secondary | ICD-10-CM | POA: Diagnosis not present

## 2015-01-10 DIAGNOSIS — Z5189 Encounter for other specified aftercare: Secondary | ICD-10-CM | POA: Diagnosis not present

## 2015-01-10 DIAGNOSIS — I252 Old myocardial infarction: Secondary | ICD-10-CM | POA: Diagnosis not present

## 2015-01-10 DIAGNOSIS — I2582 Chronic total occlusion of coronary artery: Secondary | ICD-10-CM | POA: Diagnosis not present

## 2015-01-10 DIAGNOSIS — Z955 Presence of coronary angioplasty implant and graft: Secondary | ICD-10-CM | POA: Diagnosis not present

## 2015-01-10 DIAGNOSIS — J3089 Other allergic rhinitis: Secondary | ICD-10-CM | POA: Diagnosis not present

## 2015-01-10 DIAGNOSIS — I10 Essential (primary) hypertension: Secondary | ICD-10-CM | POA: Diagnosis not present

## 2015-01-10 DIAGNOSIS — J301 Allergic rhinitis due to pollen: Secondary | ICD-10-CM | POA: Diagnosis not present

## 2015-01-12 ENCOUNTER — Other Ambulatory Visit: Payer: Medicare Other

## 2015-01-12 ENCOUNTER — Encounter (HOSPITAL_COMMUNITY)
Admission: RE | Admit: 2015-01-12 | Discharge: 2015-01-12 | Disposition: A | Discharge status: Home / Self Care | Payer: Medicare Other | Source: Ambulatory Visit | Attending: Cardiology | Admitting: Cardiology

## 2015-01-12 DIAGNOSIS — I10 Essential (primary) hypertension: Secondary | ICD-10-CM | POA: Diagnosis not present

## 2015-01-12 DIAGNOSIS — I2582 Chronic total occlusion of coronary artery: Secondary | ICD-10-CM | POA: Diagnosis not present

## 2015-01-12 DIAGNOSIS — I428 Other cardiomyopathies: Secondary | ICD-10-CM | POA: Diagnosis not present

## 2015-01-12 DIAGNOSIS — I252 Old myocardial infarction: Secondary | ICD-10-CM | POA: Diagnosis not present

## 2015-01-12 DIAGNOSIS — Z5189 Encounter for other specified aftercare: Secondary | ICD-10-CM | POA: Diagnosis not present

## 2015-01-12 DIAGNOSIS — Z955 Presence of coronary angioplasty implant and graft: Secondary | ICD-10-CM | POA: Diagnosis not present

## 2015-01-13 ENCOUNTER — Other Ambulatory Visit (INDEPENDENT_AMBULATORY_CARE_PROVIDER_SITE_OTHER): Payer: Medicare Other | Admitting: *Deleted

## 2015-01-13 DIAGNOSIS — R0989 Other specified symptoms and signs involving the circulatory and respiratory systems: Secondary | ICD-10-CM

## 2015-01-13 DIAGNOSIS — I429 Cardiomyopathy, unspecified: Secondary | ICD-10-CM | POA: Diagnosis not present

## 2015-01-13 DIAGNOSIS — E785 Hyperlipidemia, unspecified: Secondary | ICD-10-CM | POA: Diagnosis not present

## 2015-01-13 LAB — LIPID PANEL
Cholesterol: 156 mg/dL (ref 0–200)
HDL: 37.1 mg/dL — ABNORMAL LOW (ref >39.00)
LDL Cholesterol: 80 mg/dL (ref 0–99)
NONHDL: 118.9
Total CHOL/HDL Ratio: 4
Triglycerides: 193 mg/dL — ABNORMAL HIGH (ref 0.0–149.0)
VLDL: 38.6 mg/dL (ref 0.0–40.0)

## 2015-01-13 NOTE — Addendum Note (Signed)
Addended by: Eulis Foster on: 01/13/2015 09:26 AM   Modules accepted: Orders

## 2015-01-14 ENCOUNTER — Encounter (HOSPITAL_COMMUNITY)
Admission: RE | Admit: 2015-01-14 | Discharge: 2015-01-14 | Disposition: A | Discharge status: Home / Self Care | Payer: Medicare Other | Source: Ambulatory Visit | Attending: Cardiology | Admitting: Cardiology

## 2015-01-14 DIAGNOSIS — I2582 Chronic total occlusion of coronary artery: Secondary | ICD-10-CM | POA: Diagnosis not present

## 2015-01-14 DIAGNOSIS — I428 Other cardiomyopathies: Secondary | ICD-10-CM | POA: Diagnosis not present

## 2015-01-14 DIAGNOSIS — Z955 Presence of coronary angioplasty implant and graft: Secondary | ICD-10-CM | POA: Diagnosis not present

## 2015-01-14 DIAGNOSIS — Z5189 Encounter for other specified aftercare: Secondary | ICD-10-CM | POA: Diagnosis not present

## 2015-01-14 DIAGNOSIS — I10 Essential (primary) hypertension: Secondary | ICD-10-CM | POA: Diagnosis not present

## 2015-01-14 DIAGNOSIS — I252 Old myocardial infarction: Secondary | ICD-10-CM | POA: Diagnosis not present

## 2015-01-14 NOTE — Progress Notes (Addendum)
Pt graduates today from the 9:45 cardiac rehab phase II program with the completion of 36 exercise sessions.  Pt maintained good attendance to exercise.  Pt MET level progression from 2.0 to 2.5.  At times pt exercise progression is hampered by the socialization with fellow participants.  Encouraged pt to continue to work hard and consistently.  Pt attended education classes and demonstrates an appropriate level of understanding of heart healthy lifestyle.  Pt plans to continue his home exercise here in the [place] cardiac rehab maintenance program.  Pt will start on April 18. Pt will benefit from continuing exercise in a medical supervised program.  Medication list reconciled.  Pt denies any barriers to compliancy of his medications. Repeat Psychosocial Assessment - repeat PHQ2 score 0. Pt enjoys hunting which he is back doing with no problems.  Pt demonstrates healthy and positive coping skills.  No needs identified. Short term goal met - Pt feels that his heart is doing better because of the activities he is able to do at ease.  Long term goal was to graduate early.  Pt enjoyed the social aspect of the rehab program and wanted to continue to participate therefore this goal was not met as a choice by the pt.  It was a pleasure to work with him and we look forward to having him in the cardiac rehab maintenance program.  Maurice Small RN, BSN

## 2015-01-17 ENCOUNTER — Encounter (HOSPITAL_COMMUNITY): Payer: Medicare Other

## 2015-01-17 ENCOUNTER — Encounter (HOSPITAL_COMMUNITY)
Admission: RE | Admit: 2015-01-17 | Discharge: 2015-01-17 | Disposition: A | Discharge status: Home / Self Care | Payer: Self-pay | Source: Ambulatory Visit | Attending: Cardiology | Admitting: Cardiology

## 2015-01-17 DIAGNOSIS — J301 Allergic rhinitis due to pollen: Secondary | ICD-10-CM | POA: Diagnosis not present

## 2015-01-17 DIAGNOSIS — J3081 Allergic rhinitis due to animal (cat) (dog) hair and dander: Secondary | ICD-10-CM | POA: Diagnosis not present

## 2015-01-17 DIAGNOSIS — I252 Old myocardial infarction: Secondary | ICD-10-CM | POA: Insufficient documentation

## 2015-01-17 DIAGNOSIS — Z48812 Encounter for surgical aftercare following surgery on the circulatory system: Secondary | ICD-10-CM | POA: Insufficient documentation

## 2015-01-17 DIAGNOSIS — J3089 Other allergic rhinitis: Secondary | ICD-10-CM | POA: Diagnosis not present

## 2015-01-17 DIAGNOSIS — Z955 Presence of coronary angioplasty implant and graft: Secondary | ICD-10-CM | POA: Insufficient documentation

## 2015-01-18 ENCOUNTER — Telehealth: Payer: Self-pay | Admitting: Cardiology

## 2015-01-18 DIAGNOSIS — E291 Testicular hypofunction: Secondary | ICD-10-CM | POA: Diagnosis not present

## 2015-01-18 NOTE — Telephone Encounter (Signed)
New Message    Patient is calling to speak to nurse in regards to test that were taken on him.  Please call back    [phone] cell call this

## 2015-01-18 NOTE — Telephone Encounter (Signed)
Spoke with patient about recent lab results 

## 2015-01-19 ENCOUNTER — Encounter (HOSPITAL_COMMUNITY): Payer: Medicare Other

## 2015-01-19 ENCOUNTER — Encounter (HOSPITAL_COMMUNITY)
Admission: RE | Admit: 2015-01-19 | Discharge: 2015-01-19 | Disposition: A | Discharge status: Home / Self Care | Payer: Self-pay | Source: Ambulatory Visit | Attending: Cardiology | Admitting: Cardiology

## 2015-01-21 ENCOUNTER — Encounter (HOSPITAL_COMMUNITY): Payer: Medicare Other

## 2015-01-21 ENCOUNTER — Encounter (HOSPITAL_COMMUNITY)
Admission: RE | Admit: 2015-01-21 | Discharge: 2015-01-21 | Disposition: A | Discharge status: Home / Self Care | Payer: Self-pay | Source: Ambulatory Visit | Attending: Cardiology | Admitting: Cardiology

## 2015-01-24 ENCOUNTER — Encounter (HOSPITAL_COMMUNITY)
Admission: RE | Admit: 2015-01-24 | Discharge: 2015-01-24 | Disposition: A | Discharge status: Home / Self Care | Payer: Self-pay | Source: Ambulatory Visit | Attending: Cardiology | Admitting: Cardiology

## 2015-01-24 ENCOUNTER — Encounter (HOSPITAL_COMMUNITY): Payer: Medicare Other

## 2015-01-24 DIAGNOSIS — L821 Other seborrheic keratosis: Secondary | ICD-10-CM | POA: Diagnosis not present

## 2015-01-24 DIAGNOSIS — Z85828 Personal history of other malignant neoplasm of skin: Secondary | ICD-10-CM | POA: Diagnosis not present

## 2015-01-24 DIAGNOSIS — Z85820 Personal history of malignant melanoma of skin: Secondary | ICD-10-CM | POA: Diagnosis not present

## 2015-01-24 DIAGNOSIS — D1801 Hemangioma of skin and subcutaneous tissue: Secondary | ICD-10-CM | POA: Diagnosis not present

## 2015-01-24 DIAGNOSIS — D2271 Melanocytic nevi of right lower limb, including hip: Secondary | ICD-10-CM | POA: Diagnosis not present

## 2015-01-24 DIAGNOSIS — L570 Actinic keratosis: Secondary | ICD-10-CM | POA: Diagnosis not present

## 2015-01-26 ENCOUNTER — Encounter (HOSPITAL_COMMUNITY): Payer: Medicare Other

## 2015-01-26 ENCOUNTER — Encounter (HOSPITAL_COMMUNITY)
Admission: RE | Admit: 2015-01-26 | Discharge: 2015-01-26 | Disposition: A | Discharge status: Home / Self Care | Payer: Self-pay | Source: Ambulatory Visit | Attending: Cardiology | Admitting: Cardiology

## 2015-01-27 DIAGNOSIS — J3081 Allergic rhinitis due to animal (cat) (dog) hair and dander: Secondary | ICD-10-CM | POA: Diagnosis not present

## 2015-01-27 DIAGNOSIS — J301 Allergic rhinitis due to pollen: Secondary | ICD-10-CM | POA: Diagnosis not present

## 2015-01-27 DIAGNOSIS — J3089 Other allergic rhinitis: Secondary | ICD-10-CM | POA: Diagnosis not present

## 2015-01-28 ENCOUNTER — Encounter (HOSPITAL_COMMUNITY)
Admission: RE | Admit: 2015-01-28 | Discharge: 2015-01-28 | Disposition: A | Discharge status: Home / Self Care | Payer: Self-pay | Source: Ambulatory Visit | Attending: Cardiology | Admitting: Cardiology

## 2015-01-28 ENCOUNTER — Encounter (HOSPITAL_COMMUNITY): Payer: Medicare Other

## 2015-01-31 ENCOUNTER — Encounter (HOSPITAL_COMMUNITY)
Admission: RE | Admit: 2015-01-31 | Discharge: 2015-01-31 | Disposition: A | Discharge status: Home / Self Care | Payer: Self-pay | Source: Ambulatory Visit | Attending: Cardiology | Admitting: Cardiology

## 2015-01-31 DIAGNOSIS — J3089 Other allergic rhinitis: Secondary | ICD-10-CM | POA: Diagnosis not present

## 2015-01-31 DIAGNOSIS — J301 Allergic rhinitis due to pollen: Secondary | ICD-10-CM | POA: Diagnosis not present

## 2015-01-31 DIAGNOSIS — I252 Old myocardial infarction: Secondary | ICD-10-CM | POA: Insufficient documentation

## 2015-01-31 DIAGNOSIS — Z48812 Encounter for surgical aftercare following surgery on the circulatory system: Secondary | ICD-10-CM | POA: Insufficient documentation

## 2015-01-31 DIAGNOSIS — J3081 Allergic rhinitis due to animal (cat) (dog) hair and dander: Secondary | ICD-10-CM | POA: Diagnosis not present

## 2015-01-31 DIAGNOSIS — Z955 Presence of coronary angioplasty implant and graft: Secondary | ICD-10-CM | POA: Insufficient documentation

## 2015-02-02 ENCOUNTER — Encounter (HOSPITAL_COMMUNITY)
Admission: RE | Admit: 2015-02-02 | Discharge: 2015-02-02 | Disposition: A | Discharge status: Home / Self Care | Payer: Self-pay | Source: Ambulatory Visit | Attending: Cardiology | Admitting: Cardiology

## 2015-02-04 ENCOUNTER — Encounter (HOSPITAL_COMMUNITY)
Admission: RE | Admit: 2015-02-04 | Discharge: 2015-02-04 | Disposition: A | Discharge status: Home / Self Care | Payer: Self-pay | Source: Ambulatory Visit | Attending: Cardiology | Admitting: Cardiology

## 2015-02-07 ENCOUNTER — Encounter (HOSPITAL_COMMUNITY)
Admission: RE | Admit: 2015-02-07 | Discharge: 2015-02-07 | Disposition: A | Discharge status: Home / Self Care | Payer: Self-pay | Source: Ambulatory Visit | Attending: Cardiology | Admitting: Cardiology

## 2015-02-09 ENCOUNTER — Encounter (HOSPITAL_COMMUNITY): Payer: Self-pay

## 2015-02-10 DIAGNOSIS — Z23 Encounter for immunization: Secondary | ICD-10-CM | POA: Diagnosis not present

## 2015-02-10 DIAGNOSIS — E291 Testicular hypofunction: Secondary | ICD-10-CM | POA: Diagnosis not present

## 2015-02-11 ENCOUNTER — Encounter (HOSPITAL_COMMUNITY)
Admission: RE | Admit: 2015-02-11 | Discharge: 2015-02-11 | Disposition: A | Discharge status: Home / Self Care | Payer: Self-pay | Source: Ambulatory Visit | Attending: Cardiology | Admitting: Cardiology

## 2015-02-14 ENCOUNTER — Encounter (HOSPITAL_COMMUNITY)
Admission: RE | Admit: 2015-02-14 | Discharge: 2015-02-14 | Disposition: A | Discharge status: Home / Self Care | Payer: Self-pay | Source: Ambulatory Visit | Attending: Cardiology | Admitting: Cardiology

## 2015-02-16 ENCOUNTER — Encounter (HOSPITAL_COMMUNITY)
Admission: RE | Admit: 2015-02-16 | Discharge: 2015-02-16 | Disposition: A | Discharge status: Home / Self Care | Payer: Self-pay | Source: Ambulatory Visit | Attending: Cardiology | Admitting: Cardiology

## 2015-02-16 DIAGNOSIS — J301 Allergic rhinitis due to pollen: Secondary | ICD-10-CM | POA: Diagnosis not present

## 2015-02-16 DIAGNOSIS — J3089 Other allergic rhinitis: Secondary | ICD-10-CM | POA: Diagnosis not present

## 2015-02-17 ENCOUNTER — Ambulatory Visit (INDEPENDENT_AMBULATORY_CARE_PROVIDER_SITE_OTHER): Payer: Medicare Other | Admitting: Cardiology

## 2015-02-17 ENCOUNTER — Encounter: Payer: Self-pay | Admitting: Cardiology

## 2015-02-17 VITALS — BP 124/62 | HR 60 | Ht 75.5 in | Wt 232.0 lb

## 2015-02-17 DIAGNOSIS — I429 Cardiomyopathy, unspecified: Secondary | ICD-10-CM

## 2015-02-17 DIAGNOSIS — I2510 Atherosclerotic heart disease of native coronary artery without angina pectoris: Secondary | ICD-10-CM

## 2015-02-17 DIAGNOSIS — I491 Atrial premature depolarization: Secondary | ICD-10-CM

## 2015-02-17 LAB — CBC WITH DIFFERENTIAL/PLATELET
BASOS PCT: 0.6 % (ref 0.0–3.0)
Basophils Absolute: 0.1 10*3/uL (ref 0.0–0.1)
EOS ABS: 0.4 10*3/uL (ref 0.0–0.7)
Eosinophils Relative: 4.7 % (ref 0.0–5.0)
HCT: 42.7 % (ref 39.0–52.0)
HEMOGLOBIN: 14.7 g/dL (ref 13.0–17.0)
LYMPHS ABS: 1.2 10*3/uL (ref 0.7–4.0)
Lymphocytes Relative: 13.4 % (ref 12.0–46.0)
MCHC: 34.5 g/dL (ref 30.0–36.0)
MCV: 89.6 fl (ref 78.0–100.0)
MONO ABS: 0.5 10*3/uL (ref 0.1–1.0)
Monocytes Relative: 6.3 % (ref 3.0–12.0)
NEUTROS PCT: 75 % (ref 43.0–77.0)
Neutro Abs: 6.5 10*3/uL (ref 1.4–7.7)
Platelets: 202 10*3/uL (ref 150.0–400.0)
RBC: 4.76 Mil/uL (ref 4.22–5.81)
RDW: 13.9 % (ref 11.5–15.5)
WBC: 8.6 10*3/uL (ref 4.0–10.5)

## 2015-02-17 NOTE — Progress Notes (Signed)
Patient ID: [name], [name], male   DOB: 11-Jul-1934, 81 y.o.   MRN: [id number] PCP: [name]  81 yo with history of pituitary adenoma s/p resection, nonobstructive CAD, nonischemic cardiomyopathy, and chronic atrial bigeminy presents for cardiology followup.  Patient has a long history of atrial bigeminy.  He is in this today.  He had had a nonischemic cardiomyopathy with EF around 40% but most recent echo showed normal EF.  Historically, he has not been able to tolerate statins, and he has been tried on multiple agents. He is now on Repatha.   In 11/15, he developed chest tightness and was admitted to [facility] with NSTEMI.  LHC showed 60-70% proximal LAD stenosis with totally occluded LCx.  This was opened with DES to the proximal LCx.  Echo post-cath showed EF 55-60%.  He has done well since that time.  No further chest pain.  He denies exertional dyspnea.  No orthopnea/PND, no lightheadedness.  He can only tolerate Coreg 3.125 mg 1/2 tab twice a day.  He has had difficulty with BP-active meds in the setting of adrenal insufficiency post pituitary surgery.  He is doing cardiac rehab.   ECG: NSR, bigeminal PACs  Labs (5/12): K 4.3, creatinine 1.03 Labs (2/15): K 4, creatinine 1.1, LDL 184 Labs (11/15): K 4.8, creatinine 1.07 Labs (3/16): K 4.4, creatinine 0.9 Labs (4/16): LDL 80, HDL 37 (on Repatha)  PMH: 1. Hyperlipidemia: He has been unable to tolerate statins.  2. Chronic atrial bigeminy: x years.  3. Hypothyroidism: s/p surgery for pituitary adenoma.  4. Pituitary adenoma: s/p surgery in 3/11 for removal.  Now on thyroid replacement and hydrocortisone.  5. Cardiomyopathy: Nonischemic.  LHC (2009) with 50% mid LAD, 60% distal LAD.  Left ventriculogram (2009) with EF 40-45%.  Echo (8/13) with EF 35-40%, mild LVH.  Echo (11/14) with EF 40%, diffuse hypokinesis, normal RV.  He was tried in the past on an ACEI but apparently at that time began to have side effects from his pituitary adenoma that  were thought initially to be ACEI-related.   He has been off ACEI since that time.  He has had trouble tolerating more that 1/2 tab of 3.125 mg Coreg once a day. Echo (11/15) with EF 55-60%, aortic sclerosis without significant stenosis.  6. CAD: 2009 LHC with 50% mid, 60% distal LAD. NSTEMI 11/15 with 60-70% pLAD, totally occluded LCx with DES to proximal LCx.  EF 55-60% by LV-gram.  7. Mild cognitive impairment.  8. Carotid stenosis: Carotid dopplers (1/16) with 40-59% RICA stenosis.   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   FH: Brother with CAD.  ROS: All systems reviewed and negative except as per HPI.   Current Outpatient Prescriptions  Medication Sig Dispense Refill  . ALPRAZolam (XANAX) 0.5 MG tablet Take 0.5 mg by mouth as needed for anxiety.     Marland Kitchen aspirin 81 MG tablet Take 81 mg by mouth daily.      . carvedilol (COREG) 3.125 MG tablet Take 0.5 tablets (1.5625 mg total) by mouth daily. 1/2 tablet two times a day 30 tablet 6  . Cinnamon 500 MG capsule Take 500 mg by mouth daily.      . Coenzyme Q10 (COQ10) 200 MG CAPS Take 1 capsule by mouth daily.      . diazepam (VALIUM) 5 MG tablet Take 5 mg by mouth as needed for anxiety.     Marland Kitchen EPIPEN 2-PAK 0.3 MG/0.3ML SOAJ injection Inject 1 Dose as directed  as needed.  0  . ergocalciferol (VITAMIN D2) 50000 UNITS capsule Take 50,000 Units by mouth once a week.      . finasteride (PROSCAR) 5 MG tablet Take 5 mg by mouth daily.      . Glucosamine Sulfate 1000 MG CAPS Take 1 capsule by mouth daily.      . hydrocortisone (CORTEF) 10 MG tablet Take 15 mg by mouth daily. One and on half tablet daily    . levothyroxine (SYNTHROID, LEVOTHROID) 150 MCG tablet Take 150 mcg by mouth daily before breakfast.    . nitroGLYCERIN (NITROSTAT) 0.4 MG SL tablet Place 1 tablet (0.4 mg total) under the tongue every 5 (five) minutes as needed for chest pain. 90 tablet 3  . Red Yeast Rice Extract 600 MG CAPS Take 600 mg by mouth  daily.    . tadalafil (CIALIS) 5 MG tablet Take 5 mg by mouth daily.Do not take nitro within 48 hrs after taking cialis 10 tablet 1  . testosterone cypionate (DEPOTESTOTERONE CYPIONATE) 200 MG/ML injection Inject into the muscle every 21 ( twenty-one) days. 1.62 % injection    . ticagrelor (BRILINTA) 90 MG TABS tablet Take 1 tablet (90 mg total) by mouth 2 (two) times daily. 60 tablet 10  . VESICARE 10 MG tablet Take 5 mg by mouth daily.   10  . WELCHOL 3.75 G PACK Take 1 packet by mouth daily.     . Evolocumab (REPATHA) 140 MG/ML SOSY Inject into the skin every 14 (fourteen) days.     No current facility-administered medications for this visit.    BP 124/62 mmHg  Pulse 60  Ht 6' 3.5" (1.918 m)  Wt 232 lb (105.235 kg)  BMI 28.61 kg/m2  SpO2 96% General: NAD Neck: No JVD, no thyromegaly or thyroid nodule.  Lungs: Clear to auscultation bilaterally with normal respiratory effort. CV: Nondisplaced PMI.  Heart regular S1/S2, no S3/S4, no murmur.  No edema.  Right carotid bruit.  Normal pedal pulses.  Abdomen: Soft, nontender, no hepatosplenomegaly, no distention.  Skin: Intact without lesions or rashes.  Neurologic: Alert and oriented x 3.  Psych: Normal affect. Extremities: No clubbing or cyanosis.   Assessment/Plan: 1. Cardiomyopathy: EF 40% with diffuse hypokinesis on 11/14 echo, but echo post-NSTEMI actually showed EF up to 55-60%.  He is euvolemic with NYHA class I symptoms.  Continue Coreg, cannot tolerate uptitration.   2. CAD: s/p NSTEMI.  No further chest pain.  He has not tolerated multiple statins but is now on Repatha.   - Will continue ASA 81 and Brilinta 90 mg bid.  - Continue Coreg to 3.125 mg 1/2 tab bid  - Continue Repatha. 3. Hyperlipidemia: He has not tolerated multiple statins.  LDL much improved on Repatha. 4. Carotid bruit: On right.  40-59% RICA stenosis on carotid dopplers.  Repeat in 1/17.  5. Atrial bigeminy: Chronic.  EF is normal on echo.  He does not  tolerate higher doses of beta blockers.   Followup in 6 months.   [name] 02/17/2015

## 2015-02-17 NOTE — Patient Instructions (Signed)
Medication Instructions:  No changes today.  Labwork: CBCd today  Testing/Procedures: None today.  Follow-Up: Your physician wants you to follow-up in: 6 months with Dr Aundra Dubin. (November 2016). You will receive a reminder letter in the mail two months in advance. If you don't receive a letter, please call our office to schedule the follow-up appointment.

## 2015-02-18 ENCOUNTER — Encounter (HOSPITAL_COMMUNITY): Payer: Self-pay

## 2015-02-21 ENCOUNTER — Encounter (HOSPITAL_COMMUNITY)
Admission: RE | Admit: 2015-02-21 | Discharge: 2015-02-21 | Disposition: A | Discharge status: Home / Self Care | Payer: Self-pay | Source: Ambulatory Visit | Attending: Cardiology | Admitting: Cardiology

## 2015-02-21 DIAGNOSIS — J301 Allergic rhinitis due to pollen: Secondary | ICD-10-CM | POA: Diagnosis not present

## 2015-02-21 DIAGNOSIS — J3089 Other allergic rhinitis: Secondary | ICD-10-CM | POA: Diagnosis not present

## 2015-02-23 ENCOUNTER — Encounter (HOSPITAL_COMMUNITY): Payer: Self-pay

## 2015-02-25 ENCOUNTER — Encounter (HOSPITAL_COMMUNITY)
Admission: RE | Admit: 2015-02-25 | Discharge: 2015-02-25 | Disposition: A | Discharge status: Home / Self Care | Payer: Self-pay | Source: Ambulatory Visit | Attending: Cardiology | Admitting: Cardiology

## 2015-03-02 ENCOUNTER — Encounter (HOSPITAL_COMMUNITY): Payer: Self-pay | Attending: Cardiology

## 2015-03-02 DIAGNOSIS — J301 Allergic rhinitis due to pollen: Secondary | ICD-10-CM | POA: Diagnosis not present

## 2015-03-02 DIAGNOSIS — Z48812 Encounter for surgical aftercare following surgery on the circulatory system: Secondary | ICD-10-CM | POA: Insufficient documentation

## 2015-03-02 DIAGNOSIS — J3081 Allergic rhinitis due to animal (cat) (dog) hair and dander: Secondary | ICD-10-CM | POA: Diagnosis not present

## 2015-03-02 DIAGNOSIS — Z955 Presence of coronary angioplasty implant and graft: Secondary | ICD-10-CM | POA: Insufficient documentation

## 2015-03-02 DIAGNOSIS — J3089 Other allergic rhinitis: Secondary | ICD-10-CM | POA: Diagnosis not present

## 2015-03-02 DIAGNOSIS — E291 Testicular hypofunction: Secondary | ICD-10-CM | POA: Diagnosis not present

## 2015-03-02 DIAGNOSIS — I252 Old myocardial infarction: Secondary | ICD-10-CM | POA: Insufficient documentation

## 2015-03-04 ENCOUNTER — Encounter (HOSPITAL_COMMUNITY): Admission: RE | Admit: 2015-03-04 | Payer: Self-pay | Source: Ambulatory Visit

## 2015-03-04 DIAGNOSIS — J301 Allergic rhinitis due to pollen: Secondary | ICD-10-CM | POA: Diagnosis not present

## 2015-03-04 DIAGNOSIS — J3089 Other allergic rhinitis: Secondary | ICD-10-CM | POA: Diagnosis not present

## 2015-03-04 DIAGNOSIS — J3081 Allergic rhinitis due to animal (cat) (dog) hair and dander: Secondary | ICD-10-CM | POA: Diagnosis not present

## 2015-03-07 ENCOUNTER — Encounter (HOSPITAL_COMMUNITY): Payer: Self-pay

## 2015-03-09 ENCOUNTER — Encounter (HOSPITAL_COMMUNITY): Payer: Self-pay

## 2015-03-09 DIAGNOSIS — J3089 Other allergic rhinitis: Secondary | ICD-10-CM | POA: Diagnosis not present

## 2015-03-09 DIAGNOSIS — J301 Allergic rhinitis due to pollen: Secondary | ICD-10-CM | POA: Diagnosis not present

## 2015-03-11 ENCOUNTER — Encounter (HOSPITAL_COMMUNITY): Payer: Self-pay

## 2015-03-14 ENCOUNTER — Encounter (HOSPITAL_COMMUNITY): Payer: Self-pay

## 2015-03-14 DIAGNOSIS — J301 Allergic rhinitis due to pollen: Secondary | ICD-10-CM | POA: Diagnosis not present

## 2015-03-14 DIAGNOSIS — J3089 Other allergic rhinitis: Secondary | ICD-10-CM | POA: Diagnosis not present

## 2015-03-16 ENCOUNTER — Encounter (HOSPITAL_COMMUNITY): Payer: Self-pay

## 2015-03-18 ENCOUNTER — Encounter (HOSPITAL_COMMUNITY): Payer: Self-pay

## 2015-03-21 ENCOUNTER — Encounter (HOSPITAL_COMMUNITY): Payer: Self-pay

## 2015-03-23 ENCOUNTER — Encounter (HOSPITAL_COMMUNITY): Payer: Self-pay

## 2015-03-23 DIAGNOSIS — E291 Testicular hypofunction: Secondary | ICD-10-CM | POA: Diagnosis not present

## 2015-03-23 DIAGNOSIS — J3089 Other allergic rhinitis: Secondary | ICD-10-CM | POA: Diagnosis not present

## 2015-03-23 DIAGNOSIS — J301 Allergic rhinitis due to pollen: Secondary | ICD-10-CM | POA: Diagnosis not present

## 2015-03-25 ENCOUNTER — Encounter (HOSPITAL_COMMUNITY): Payer: Self-pay

## 2015-03-28 ENCOUNTER — Encounter (HOSPITAL_COMMUNITY): Payer: Self-pay

## 2015-03-29 DIAGNOSIS — J301 Allergic rhinitis due to pollen: Secondary | ICD-10-CM | POA: Diagnosis not present

## 2015-03-29 DIAGNOSIS — J3089 Other allergic rhinitis: Secondary | ICD-10-CM | POA: Diagnosis not present

## 2015-03-29 DIAGNOSIS — J3081 Allergic rhinitis due to animal (cat) (dog) hair and dander: Secondary | ICD-10-CM | POA: Diagnosis not present

## 2015-03-30 ENCOUNTER — Encounter (HOSPITAL_COMMUNITY): Payer: Self-pay

## 2015-03-31 ENCOUNTER — Telehealth: Payer: Self-pay | Admitting: Neurology

## 2015-03-31 NOTE — Telephone Encounter (Signed)
Dr. Belenda Cruise wants a call he has a question about his MRI. The [name] wants him to have another MRI but he just had one that [name] scheduled for him and he wants to know if he can replace the one he had here in place of the one for [name]. He wants to make sure its really needed instead of him taking another MRI since he just had one The best number to call is [phone]

## 2015-03-31 NOTE — Telephone Encounter (Signed)
I returned pt's phone call. He asked me to fax the MRI of his brain done on 02/10/2014 ordered by [name] to Dr. Salomon Fick at [facility]. He is hoping to not to have the MRI ordered by Dr. Salomon Fick (scheduled for 9/21) since he had an MRI brain done here last year. I faxed the results of the MRI brain to Dr. Salomon Fick for review. I told the pt that I faxed the MRI and to follow up with Dr. Salomon Fick to see if he still desires another MRI of his brain. Pt verbalized understanding.  Of note, I had to repeat myself several times to Dr. Belenda Cruise before he understood. He kept forgetting the date of the MRI performed and I had to remind him at least four times. He also explained himself several times, repeating over and over that he had an MRI done here at [facility] recently and thinks he can use those results so he does not have to get another MRI. I assured pt many times that I was indeed going to fax the results to Dr. Salomon Fick but the decision was up to Dr. Salomon Fick whether the patient needs to proceed with the MRI ordered by Dr. Salomon Fick after he reviews the results of the MRI done in 01/2014.

## 2015-04-01 ENCOUNTER — Encounter (HOSPITAL_COMMUNITY): Payer: Self-pay

## 2015-04-01 NOTE — Telephone Encounter (Signed)
Pt called and was confused about why he need an MRI when he had one a year ago. I reviewed a telephone message from yesterday by Cyril Mourning , that she talked with pt about MRI being sent to Dr. Salomon Fick at [facility]. I restated to pt that the MRI was sent to Dr. Salomon Fick several times and also restated that it would be up to Dr. Salomon Fick if patient needed another MRI (per telephone message). Pt stated that he understood. SS

## 2015-04-06 ENCOUNTER — Encounter (HOSPITAL_COMMUNITY): Payer: Self-pay

## 2015-04-08 ENCOUNTER — Encounter (HOSPITAL_COMMUNITY): Payer: Self-pay

## 2015-04-11 ENCOUNTER — Encounter (HOSPITAL_COMMUNITY): Payer: Self-pay

## 2015-04-12 DIAGNOSIS — E291 Testicular hypofunction: Secondary | ICD-10-CM | POA: Diagnosis not present

## 2015-04-12 DIAGNOSIS — J301 Allergic rhinitis due to pollen: Secondary | ICD-10-CM | POA: Diagnosis not present

## 2015-04-12 DIAGNOSIS — J3089 Other allergic rhinitis: Secondary | ICD-10-CM | POA: Diagnosis not present

## 2015-04-12 DIAGNOSIS — J3081 Allergic rhinitis due to animal (cat) (dog) hair and dander: Secondary | ICD-10-CM | POA: Diagnosis not present

## 2015-04-12 NOTE — Telephone Encounter (Signed)
I spoke to Mrs. Yawn. She is already aware that [facility] would have to make them a CD of the images from his MRI back in 01/2014. [facility]  cannot make this CD. Mrs. Ouch said that the pt is aware of this and was actually already headed over to [place] to obtain the CD. I reiterated that I would be happy to fax over the MRI results in a typed form that the doctors wrote, but an unable to make a CD of the images. Pt's wife is aware.

## 2015-04-12 NOTE — Telephone Encounter (Signed)
I called Dr. Marygrace Drought office and spoke to [facility]. They have received the results of the MRI from 01/2014. However, they need the CD of the images and they have requested the CD of the images. Per the nurses' notes from yesterday, they spoke to the pt's wife and she is aware of this. MRI was completed at [facility]. Attempted to call pt back at home number, it just kept ringing.

## 2015-04-12 NOTE — Telephone Encounter (Signed)
Patient called and stated that they never received our fax and requested we send it again. Please call and advise.

## 2015-04-13 ENCOUNTER — Encounter (HOSPITAL_COMMUNITY): Payer: Self-pay

## 2015-04-15 ENCOUNTER — Encounter (HOSPITAL_COMMUNITY): Payer: Self-pay

## 2015-04-18 ENCOUNTER — Encounter (HOSPITAL_COMMUNITY): Payer: Self-pay

## 2015-04-18 DIAGNOSIS — J3081 Allergic rhinitis due to animal (cat) (dog) hair and dander: Secondary | ICD-10-CM | POA: Diagnosis not present

## 2015-04-18 DIAGNOSIS — J301 Allergic rhinitis due to pollen: Secondary | ICD-10-CM | POA: Diagnosis not present

## 2015-04-18 DIAGNOSIS — J3089 Other allergic rhinitis: Secondary | ICD-10-CM | POA: Diagnosis not present

## 2015-04-20 ENCOUNTER — Encounter (HOSPITAL_COMMUNITY): Payer: Self-pay

## 2015-04-21 ENCOUNTER — Encounter: Payer: Self-pay | Admitting: Cardiology

## 2015-04-22 ENCOUNTER — Encounter (HOSPITAL_COMMUNITY): Payer: Self-pay

## 2015-04-25 ENCOUNTER — Encounter (HOSPITAL_COMMUNITY): Payer: Self-pay

## 2015-04-25 DIAGNOSIS — J3089 Other allergic rhinitis: Secondary | ICD-10-CM | POA: Diagnosis not present

## 2015-04-25 DIAGNOSIS — J301 Allergic rhinitis due to pollen: Secondary | ICD-10-CM | POA: Diagnosis not present

## 2015-04-27 ENCOUNTER — Encounter (HOSPITAL_COMMUNITY): Payer: Self-pay

## 2015-04-28 DIAGNOSIS — E230 Hypopituitarism: Secondary | ICD-10-CM | POA: Diagnosis not present

## 2015-04-28 DIAGNOSIS — E039 Hypothyroidism, unspecified: Secondary | ICD-10-CM | POA: Diagnosis not present

## 2015-04-28 DIAGNOSIS — J301 Allergic rhinitis due to pollen: Secondary | ICD-10-CM | POA: Diagnosis not present

## 2015-04-28 DIAGNOSIS — I2510 Atherosclerotic heart disease of native coronary artery without angina pectoris: Secondary | ICD-10-CM | POA: Diagnosis not present

## 2015-04-28 DIAGNOSIS — J3081 Allergic rhinitis due to animal (cat) (dog) hair and dander: Secondary | ICD-10-CM | POA: Diagnosis not present

## 2015-04-28 DIAGNOSIS — J309 Allergic rhinitis, unspecified: Secondary | ICD-10-CM | POA: Diagnosis not present

## 2015-04-28 DIAGNOSIS — Z6829 Body mass index (BMI) 29.0-29.9, adult: Secondary | ICD-10-CM | POA: Diagnosis not present

## 2015-04-28 DIAGNOSIS — E780 Pure hypercholesterolemia: Secondary | ICD-10-CM | POA: Diagnosis not present

## 2015-04-28 DIAGNOSIS — J3089 Other allergic rhinitis: Secondary | ICD-10-CM | POA: Diagnosis not present

## 2015-04-29 ENCOUNTER — Encounter (HOSPITAL_COMMUNITY): Payer: Self-pay

## 2015-05-03 DIAGNOSIS — J3081 Allergic rhinitis due to animal (cat) (dog) hair and dander: Secondary | ICD-10-CM | POA: Diagnosis not present

## 2015-05-03 DIAGNOSIS — E291 Testicular hypofunction: Secondary | ICD-10-CM | POA: Diagnosis not present

## 2015-05-03 DIAGNOSIS — J3089 Other allergic rhinitis: Secondary | ICD-10-CM | POA: Diagnosis not present

## 2015-05-03 DIAGNOSIS — J301 Allergic rhinitis due to pollen: Secondary | ICD-10-CM | POA: Diagnosis not present

## 2015-05-10 DIAGNOSIS — J301 Allergic rhinitis due to pollen: Secondary | ICD-10-CM | POA: Diagnosis not present

## 2015-05-10 DIAGNOSIS — J3089 Other allergic rhinitis: Secondary | ICD-10-CM | POA: Diagnosis not present

## 2015-05-27 DIAGNOSIS — J301 Allergic rhinitis due to pollen: Secondary | ICD-10-CM | POA: Diagnosis not present

## 2015-05-27 DIAGNOSIS — J3089 Other allergic rhinitis: Secondary | ICD-10-CM | POA: Diagnosis not present

## 2015-05-27 DIAGNOSIS — J3081 Allergic rhinitis due to animal (cat) (dog) hair and dander: Secondary | ICD-10-CM | POA: Diagnosis not present

## 2015-05-31 DIAGNOSIS — E291 Testicular hypofunction: Secondary | ICD-10-CM | POA: Diagnosis not present

## 2015-06-08 DIAGNOSIS — J3081 Allergic rhinitis due to animal (cat) (dog) hair and dander: Secondary | ICD-10-CM | POA: Diagnosis not present

## 2015-06-08 DIAGNOSIS — J301 Allergic rhinitis due to pollen: Secondary | ICD-10-CM | POA: Diagnosis not present

## 2015-06-08 DIAGNOSIS — J3089 Other allergic rhinitis: Secondary | ICD-10-CM | POA: Diagnosis not present

## 2015-06-15 DIAGNOSIS — E291 Testicular hypofunction: Secondary | ICD-10-CM | POA: Diagnosis not present

## 2015-06-16 DIAGNOSIS — Z961 Presence of intraocular lens: Secondary | ICD-10-CM | POA: Diagnosis not present

## 2015-06-16 DIAGNOSIS — H4720 Unspecified optic atrophy: Secondary | ICD-10-CM | POA: Diagnosis not present

## 2015-06-16 DIAGNOSIS — H43813 Vitreous degeneration, bilateral: Secondary | ICD-10-CM | POA: Diagnosis not present

## 2015-06-24 DIAGNOSIS — J3089 Other allergic rhinitis: Secondary | ICD-10-CM | POA: Diagnosis not present

## 2015-06-24 DIAGNOSIS — J3081 Allergic rhinitis due to animal (cat) (dog) hair and dander: Secondary | ICD-10-CM | POA: Diagnosis not present

## 2015-06-24 DIAGNOSIS — J301 Allergic rhinitis due to pollen: Secondary | ICD-10-CM | POA: Diagnosis not present

## 2015-06-29 DIAGNOSIS — J3081 Allergic rhinitis due to animal (cat) (dog) hair and dander: Secondary | ICD-10-CM | POA: Diagnosis not present

## 2015-06-29 DIAGNOSIS — J3089 Other allergic rhinitis: Secondary | ICD-10-CM | POA: Diagnosis not present

## 2015-06-29 DIAGNOSIS — J301 Allergic rhinitis due to pollen: Secondary | ICD-10-CM | POA: Diagnosis not present

## 2015-07-06 DIAGNOSIS — E291 Testicular hypofunction: Secondary | ICD-10-CM | POA: Diagnosis not present

## 2015-07-13 DIAGNOSIS — J3081 Allergic rhinitis due to animal (cat) (dog) hair and dander: Secondary | ICD-10-CM | POA: Diagnosis not present

## 2015-07-13 DIAGNOSIS — J301 Allergic rhinitis due to pollen: Secondary | ICD-10-CM | POA: Diagnosis not present

## 2015-07-13 DIAGNOSIS — J3089 Other allergic rhinitis: Secondary | ICD-10-CM | POA: Diagnosis not present

## 2015-07-18 DIAGNOSIS — D1801 Hemangioma of skin and subcutaneous tissue: Secondary | ICD-10-CM | POA: Diagnosis not present

## 2015-07-18 DIAGNOSIS — Z85820 Personal history of malignant melanoma of skin: Secondary | ICD-10-CM | POA: Diagnosis not present

## 2015-07-18 DIAGNOSIS — L570 Actinic keratosis: Secondary | ICD-10-CM | POA: Diagnosis not present

## 2015-07-18 DIAGNOSIS — J3081 Allergic rhinitis due to animal (cat) (dog) hair and dander: Secondary | ICD-10-CM | POA: Diagnosis not present

## 2015-07-18 DIAGNOSIS — Z85828 Personal history of other malignant neoplasm of skin: Secondary | ICD-10-CM | POA: Diagnosis not present

## 2015-07-18 DIAGNOSIS — L821 Other seborrheic keratosis: Secondary | ICD-10-CM | POA: Diagnosis not present

## 2015-07-18 DIAGNOSIS — J3089 Other allergic rhinitis: Secondary | ICD-10-CM | POA: Diagnosis not present

## 2015-07-18 DIAGNOSIS — J301 Allergic rhinitis due to pollen: Secondary | ICD-10-CM | POA: Diagnosis not present

## 2015-07-22 ENCOUNTER — Encounter: Payer: Self-pay | Admitting: Cardiology

## 2015-07-22 ENCOUNTER — Ambulatory Visit (INDEPENDENT_AMBULATORY_CARE_PROVIDER_SITE_OTHER): Payer: Medicare Other | Admitting: Cardiology

## 2015-07-22 VITALS — BP 124/70 | HR 64 | Ht 75.5 in | Wt 228.0 lb

## 2015-07-22 DIAGNOSIS — R002 Palpitations: Secondary | ICD-10-CM

## 2015-07-22 DIAGNOSIS — E7800 Pure hypercholesterolemia, unspecified: Secondary | ICD-10-CM | POA: Diagnosis not present

## 2015-07-22 DIAGNOSIS — I2510 Atherosclerotic heart disease of native coronary artery without angina pectoris: Secondary | ICD-10-CM

## 2015-07-22 NOTE — Patient Instructions (Signed)
Medication Instructions:  Your physician recommends that you continue on your current medications as directed. Please refer to the Current Medication list given to you today.   Labwork: none  Testing/Procedures: Your physician has requested that you have a lexiscan myoview. For further information please visit [email or web address]. Please follow instruction sheet, as given.    Follow-Up: Your physician recommends that you schedule a follow-up appointment in: 2 months - 12/19 @ 1pm   Any Other Special Instructions Will Be Listed Below (If Applicable).

## 2015-07-24 NOTE — Progress Notes (Signed)
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   MRN: [id number] PCP: [name]  81 yo with history of pituitary adenoma s/p resection, nonobstructive CAD, nonischemic cardiomyopathy, and chronic atrial bigeminy presents for cardiology followup.  Patient has a long history of atrial bigeminy.  He is in this today.  He had had a nonischemic cardiomyopathy with EF around 40% but most recent echo showed normal EF.  Historically, he has not been able to tolerate statins, and he has been tried on multiple agents. He is now on Repatha.   In 11/15, he developed chest tightness and was admitted to [facility] with NSTEMI.  LHC showed 60-70% proximal LAD stenosis with totally occluded LCx.  This was opened with DES to the proximal LCx.  Echo post-cath showed EF 55-60%.  He can only tolerate Coreg 3.125 mg 1/2 tab twice a day.  He has had difficulty with BP-active meds in the setting of adrenal insufficiency post pituitary surgery.   Overall, he seems to be be doing well.  However, for 2-3 months, he has noted right arm and central chest mild tightness when walking up a hill.  He notices this walking to [place] football games mainly. He does fine on flat ground.  Does a lot of yardwork without problems.  Works out at gym without problems.  Weight is down 4 lbs.  No dyspnea.    Labs (5/12): K 4.3, creatinine 1.03 Labs (2/15): K 4, creatinine 1.1, LDL 184 Labs (11/15): K 4.8, creatinine 1.07 Labs (3/16): K 4.4, creatinine 0.9 Labs (4/16): LDL 80, HDL 37 (on Repatha) Labs (5/16): HCT 42.7  PMH: 1. Hyperlipidemia: He has been unable to tolerate statins.  2. Chronic atrial bigeminy: x years.  3. Hypothyroidism: s/p surgery for pituitary adenoma.  4. Pituitary adenoma: s/p surgery in 3/11 for removal.  Now on thyroid replacement and hydrocortisone.  5. Cardiomyopathy: Nonischemic.  LHC (2009) with 50% mid LAD, 60% distal LAD.  Left ventriculogram (2009) with EF 40-45%.  Echo (8/13) with EF 35-40%, mild  LVH.  Echo (11/14) with EF 40%, diffuse hypokinesis, normal RV.  He was tried in the past on an ACEI but apparently at that time began to have side effects from his pituitary adenoma that were thought initially to be ACEI-related.   He has been off ACEI since that time.  He has had trouble tolerating more that 1/2 tab of 3.125 mg Coreg once a day. Echo (11/15) with EF 55-60%, aortic sclerosis without significant stenosis.  6. CAD: 2009 LHC with 50% mid, 60% distal LAD. NSTEMI 11/15 with 60-70% pLAD, totally occluded LCx with DES to proximal LCx.  EF 55-60% by LV-gram.  7. Mild cognitive impairment.  8. Carotid stenosis: Carotid dopplers (1/16) with 40-59% RICA stenosis.   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   FH: Brother with CAD.  ROS: All systems reviewed and negative except as per HPI.   Current Outpatient Prescriptions  Medication Sig Dispense Refill  . ALPRAZolam (XANAX) 0.5 MG tablet Take 0.5 mg by mouth as needed for anxiety.     Marland Kitchen aspirin 81 MG tablet Take 81 mg by mouth daily.      . carvedilol (COREG) 3.125 MG tablet Take 0.5 tablets (1.5625 mg total) by mouth daily. 1/2 tablet two times a day 30 tablet 6  . Coenzyme Q10 (COQ10) 200 MG CAPS Take 1 capsule by mouth daily.      . diazepam (VALIUM) 5 MG tablet  Take 5 mg by mouth as needed for anxiety.     Marland Kitchen EPIPEN 2-PAK 0.3 MG/0.3ML SOAJ injection Inject 1 Dose as directed as needed.  0  . ergocalciferol (VITAMIN D2) 50000 UNITS capsule Take 50,000 Units by mouth once a week.      . Evolocumab (REPATHA) 140 MG/ML SOSY Inject into the skin every 14 (fourteen) days.    . finasteride (PROSCAR) 5 MG tablet Take 5 mg by mouth daily.      . Glucosamine Sulfate 1000 MG CAPS Take 1 capsule by mouth daily.      . hydrocortisone (CORTEF) 10 MG tablet Take 15 mg by mouth daily. One and on half tablet daily    . levothyroxine (SYNTHROID, LEVOTHROID) 150 MCG tablet Take 150 mcg by mouth daily before breakfast.    .  nitroGLYCERIN (NITROSTAT) 0.4 MG SL tablet Place 1 tablet (0.4 mg total) under the tongue every 5 (five) minutes as needed for chest pain. 90 tablet 3  . Nystatin (NYAMYC) 100000 UNIT/GM POWD   10  . Red Yeast Rice Extract 600 MG CAPS Take 600 mg by mouth daily.    Marland Kitchen REPATHA SURECLICK 585 MG/ML SOAJ   10  . testosterone cypionate (DEPOTESTOTERONE CYPIONATE) 200 MG/ML injection Inject into the muscle every 21 ( twenty-one) days. 1.62 % injection    . ticagrelor (BRILINTA) 90 MG TABS tablet Take 1 tablet (90 mg total) by mouth 2 (two) times daily. 60 tablet 10  . VESICARE 10 MG tablet Take 5 mg by mouth daily.   10  . WELCHOL 3.75 G PACK Take 1 packet by mouth daily.     . tadalafil (CIALIS) 5 MG tablet Take 5 mg by mouth daily.Do not take nitro within 48 hrs after taking cialis (Patient not taking: Reported on 07/22/2015) 10 tablet 1   No current facility-administered medications for this visit.    BP 124/70 mmHg  Pulse 64  Ht 6' 3.5" (1.918 m)  Wt 228 lb (103.42 kg)  BMI 28.11 kg/m2  SpO2 97% General: NAD Neck: No JVD, no thyromegaly or thyroid nodule.  Lungs: Clear to auscultation bilaterally with normal respiratory effort. CV: Nondisplaced PMI.  Heart regular S1/S2, no S3/S4, no murmur.  1+ ankle edema.  Right carotid bruit.  Normal pedal pulses.  Abdomen: Soft, nontender, no hepatosplenomegaly, no distention.  Skin: Intact without lesions or rashes.  Neurologic: Alert and oriented x 3.  Psych: Normal affect. Extremities: No clubbing or cyanosis.   Assessment/Plan: 1. Cardiomyopathy: EF 40% with diffuse hypokinesis on 11/14 echo, but echo post-NSTEMI actually showed EF up to 55-60%.  He is euvolemic with NYHA class I symptoms.  Continue Coreg, cannot tolerate uptitration.   2. CAD: s/p NSTEMI.  He has not tolerated multiple statins but is now on Repatha.  He is having what sounds like mild angina with relatively heavy exertion.  This has been stable for several months without  progression. - Will continue ASA 81 and Brilinta 90 mg bid.  DAPT score = 0.  Will see how his Cardiolite turns out before deciding what to do about his antiplatelet regimen when he reaches a year out from PCI. - Continue Coreg to 3.125 mg 1/2 tab bid  - Continue Repatha. - I will arrange for ETT-Cardiolite for risk stratification given possible angina.  3. Hyperlipidemia: He has not tolerated multiple statins.  LDL much improved on Repatha. 4. Carotid bruit: On right.  40-59% RICA stenosis on carotid dopplers.  Repeat in 1/17.  5. Atrial bigeminy: Chronic.  EF is normal on echo.  He does not tolerate higher doses of beta blockers.   Followup in 2 months (depending on Cardiolite results).    [name] 07/24/2015

## 2015-07-26 ENCOUNTER — Telehealth (HOSPITAL_COMMUNITY): Payer: Self-pay

## 2015-07-26 DIAGNOSIS — J3089 Other allergic rhinitis: Secondary | ICD-10-CM | POA: Diagnosis not present

## 2015-07-26 DIAGNOSIS — J301 Allergic rhinitis due to pollen: Secondary | ICD-10-CM | POA: Diagnosis not present

## 2015-07-26 DIAGNOSIS — E291 Testicular hypofunction: Secondary | ICD-10-CM | POA: Diagnosis not present

## 2015-07-26 NOTE — Telephone Encounter (Signed)
Patient given detailed instructions per Myocardial Perfusion Study Information Sheet for the test on 07-28-2015 at 0715. Patient notified to arrive 15 minutes early and that it is imperative to arrive on time for appointment to keep from having the test rescheduled.  If you need to cancel or reschedule your appointment, please call the office within 24 hours of your appointment. Failure to do so may result in a cancellation of your appointment, and a $50 no show fee. Patient verbalized understanding.Oletta Lamas, Rhapsody Wolven A

## 2015-07-27 ENCOUNTER — Encounter: Payer: Self-pay | Admitting: Cardiology

## 2015-07-27 ENCOUNTER — Ambulatory Visit (INDEPENDENT_AMBULATORY_CARE_PROVIDER_SITE_OTHER): Payer: Medicare Other | Admitting: Neurology

## 2015-07-27 ENCOUNTER — Encounter: Payer: Self-pay | Admitting: Neurology

## 2015-07-27 VITALS — BP 122/80 | HR 74 | Resp 20 | Ht 75.0 in | Wt 229.0 lb

## 2015-07-27 DIAGNOSIS — I2510 Atherosclerotic heart disease of native coronary artery without angina pectoris: Secondary | ICD-10-CM | POA: Diagnosis not present

## 2015-07-27 DIAGNOSIS — R413 Other amnesia: Secondary | ICD-10-CM | POA: Diagnosis not present

## 2015-07-27 HISTORY — PX: CORONARY ANGIOPLASTY WITH STENT PLACEMENT: SHX49

## 2015-07-27 HISTORY — PX: CARDIAC CATHETERIZATION: SHX172

## 2015-07-27 NOTE — Patient Instructions (Signed)
   You will follow up in 12 month, for routine MMSE and MOCA.  I wish you  good luck at tomorrows cardiac evaluation.  No medications have been prescribed.   Abriana Saltos, MD

## 2015-07-27 NOTE — Progress Notes (Signed)
Provider:  Larey Seat, M D  Referring Provider: [name], MD Primary Care Physician:  Jerlyn Ly, MD  Chief Complaint  Patient presents with  . Follow-up    memory, rm 10, alone  . Memory Loss    HPI:  [name], [name] is a 81 y.o. male , seen here as a revisit  from [name] for follow up on subjective memory loss/ MCI.  01/13/14 Last visit with 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  [name] wife reported that her husbands personality a has changed, and she feels it is not longer all related to his severe hearing loss. He reports that he has noticed a slowing of his cognitive function over Rather than loss of memory for several years. One of his concerns he is delayed recall of names, which sometimes causes him some embarrassment in social situations. 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  He also states that the use at times compulsively searching for these names and there may come back to his memory with delay.  He also a likes to walk or do some physical activity while thinking about these names.  He had no trouble getting lost while driving and he reports no difficulties with abstraction, attention, or mathematics at basic checkbook balancing etc. In his Montral cognitive assessment test here today he scored 25/30 points. 26/30 is considered the normal range.  Based on this test he would be diagnosed as a mild cognitive impairment and not a dementia.  His wife stated he had fallen 4 times and she added some important surgical information, [name] has a history of pituitary Macroadenoma, "the size of a golfball", with previously tunneling his left more than right vision. Dr Ellene Route treated it.   07-27-15 ,  Since last visit, Dr. Belenda Cruise  Has suffered a heart attack and  is now in cardiac rehab, followed by [name]. feels as if there has been no change in his health or memory.  His wife agrees, but is not personally present. His  2015 MRI follow up study showed multiple foci of T2 hyperintensity are present in the subcortical and deep cerebral white matter, nonspecific but compatible with mild chronic small vessel ischemic disease and mild generalized cerebral atrophy.  Unchanged nodular tissue in the anterior sella and extending into the sphenoid sinus, compatible with residual pituitary adenoma, no change from imaging from [facility]. Dr. Belenda Cruise performed today with [place] in a Mini-Mental Status Examination it revealed 29 out of 30 points and his only missed take was that he couldn't name the exact date which is 07-27-15, This test was followed by a Montral cognitive assessment test and he scored here 25 out of 30 points. The most difficult part of the test are usually clock drawing, the Trail Making Test the serial sevens and the word recall. The patient lost 3 of 5 words and word recall and did repeat one sentence incompletely. This may have been due to his hearing deficits also he is wearing a hearing aid. Visual spatial testing naming and calculation attention and abstraction as well as orientation were fully intact. He generated 64 F- words. Montreal Cognitive Assessment  07/27/2015 07/28/2014 01/13/2014  Visuospatial/ Executive (0/5) 5 4 5   Naming (0/3) 3  3 3  Attention: Read list of digits (0/2) 2 2 2   Attention: Read list of letters (0/1) 1 1 1   Attention: Serial 7 subtraction starting at 100 (0/3) 3 3 3   Language: Repeat phrase (0/2) 1 2 2   Language : Fluency (0/1) 1 1 1   Abstraction (0/2) 2 2 2   Delayed Recall (0/5) 2 2 2   Orientation (0/6) 5 6 5   Total 25 26 26   Adjusted Score (based on education) 25 26 26        Review of Systems:  Out of a complete 14 system review, the patient complains of only the following symptoms, and all other reviewed  systems are negative.  Hoarse voice, delayed recall, right knee replacement.  TBI in a skiing accident in 2005. Hearing loss,  OCD- compulsions are progressing according to his wife.   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 Drives. Married with adult children.     Social History   Social History  . Marital Status: Married    Spouse Name: [name]  . Number of Children: 2  . Years of Education: College   Occupational History  . retired Clinical biochemist    Social History Main Topics  . Smoking status: Never Smoker   . Smokeless tobacco: Never Used  . Alcohol Use: Yes     Comment: occas.  . Drug Use: No  . Sexual Activity: Not on file   Other Topics Concern  . Not on file   Social History Narrative   Patient is married [name]).   Patient has two children.   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   Patient is right-handed.   Patient does not drink any caffeine.    Family History  Problem Relation Age of Onset  . Congestive Heart Failure Father 50  . Other Mother 3  . Coronary artery disease Brother   . Other Sister 56    polio    Past Medical History  Diagnosis Date  . Hyperlipidemia   . BPH (benign prostatic hypertrophy)   . Non-ischemic cardiomyopathy ([place])   . Atrial premature beats   . Coronary artery disease   . NSTEMI (non-ST elevated myocardial infarction) ([place]) 08/25/2014    Past Surgical History  Procedure Laterality Date  . Hand surgery Left   . Cataract extraction    . Back surgery    . Hammer  bunion toe surgery    . Pituitary surgery      12/01/09 Dr. Wilburn Cornelia and [name].  . Arthroscopic surgery    . Left heart catheterization with coronary angiogram N/A 08/25/2014    Procedure: LEFT HEART CATHETERIZATION WITH CORONARY ANGIOGRAM;  Surgeon: Burnell Blanks, MD;  Location: [facility] CATH LAB;  Service: Cardiovascular;  Laterality: N/A;    Current Outpatient Prescriptions  Medication Sig  Dispense Refill  . ALPRAZolam (XANAX) 0.5 MG tablet Take 0.5 mg by mouth as needed for anxiety.     Marland Kitchen aspirin 81 MG tablet Take 81 mg by mouth daily.      . carvedilol (COREG) 3.125 MG tablet Take 0.5 tablets (1.5625 mg total) by mouth daily. 1/2 tablet two times a day 30 tablet 6  . Coenzyme Q10 (COQ10) 200 MG CAPS Take 1 capsule by mouth daily.      . diazepam (VALIUM) 5 MG tablet Take 5 mg by mouth as needed for anxiety.     Marland Kitchen EPIPEN 2-PAK 0.3 MG/0.3ML SOAJ injection Inject 1 Dose  as directed as needed.  0  . ergocalciferol (VITAMIN D2) 50000 UNITS capsule Take 50,000 Units by mouth once a week.      . Evolocumab (REPATHA) 140 MG/ML SOSY Inject into the skin every 14 (fourteen) days.    . finasteride (PROSCAR) 5 MG tablet Take 5 mg by mouth daily.      . Glucosamine Sulfate 1000 MG CAPS Take 1 capsule by mouth daily.      . hydrocortisone (CORTEF) 10 MG tablet Take 15 mg by mouth daily. One and on half tablet daily    . levothyroxine (SYNTHROID, LEVOTHROID) 150 MCG tablet Take 150 mcg by mouth daily before breakfast.    . nitroGLYCERIN (NITROSTAT) 0.4 MG SL tablet Place 1 tablet (0.4 mg total) under the tongue every 5 (five) minutes as needed for chest pain. 90 tablet 3  . Nystatin (NYAMYC) 100000 UNIT/GM POWD   10  . Red Yeast Rice Extract 600 MG CAPS Take 600 mg by mouth daily.    Marland Kitchen REPATHA SURECLICK 470 MG/ML SOAJ   10  . tadalafil (CIALIS) 5 MG tablet Take 5 mg by mouth daily.Do not take nitro within 48 hrs after taking cialis 10 tablet 1  . testosterone cypionate (DEPOTESTOTERONE CYPIONATE) 200 MG/ML injection Inject into the muscle every 21 ( twenty-one) days. 1.62 % injection    . ticagrelor (BRILINTA) 90 MG TABS tablet Take 1 tablet (90 mg total) by mouth 2 (two) times daily. 60 tablet 10  . VESICARE 10 MG tablet Take 5 mg by mouth daily.   10  . WELCHOL 3.75 G PACK Take 1 packet by mouth daily.      No current facility-administered medications for this visit.    Allergies as of  07/27/2015 - Review Complete 07/27/2015  Allergen Reaction Noted  . Sulfa antibiotics Nausea And Vomiting 07/15/2014  . Sulfonamide derivatives  02/10/2009  . Tamsulosin  02/10/2009    Vitals: BP 122/80 mmHg  Pulse 74  Resp 20  Ht 6' 3"  (1.905 m)  Wt 229 lb (103.874 kg)  BMI 28.62 kg/m2 Last Weight:  Wt Readings from Last 1 Encounters:  07/27/15 229 lb (103.874 kg)   JGG:EZMO mass index is 28.62 kg/(m^2).     Last Height:   Ht Readings from Last 1 Encounters:  07/27/15 6' 3"  (1.905 m)    Physical exam:  General: The patient is awake, alert and appears not in acute distress. The patient is well groomed. Head: Normocephalic, atraumatic. Cardiovascular:  Regular rate and rhythm , without  murmurs or carotid bruit, and without distended neck veins. Respiratory: Lungs are clear to auscultation. Skin:  Without evidence of edema, or rash Trunk: The patient's posture is erect   Neurologic exam : The patient is awake and alert, oriented to place and time.   Memory subjective  described as intact.  Memory testing revealed  29-30 points on MMSE and 25 out of 30 on MOCA, at best a MCI. Not a dementia.  OCD tendencies confirmed by the patient himself.   MOCA: Montreal Cognitive Assessment  07/27/2015 07/28/2014 01/13/2014  Visuospatial/ Executive (0/5) 5 4 5   Naming (0/3) 3 3 3   Attention: Read list of digits (0/2) 2 2 2   Attention: Read list of letters (0/1) 1 1 1   Attention: Serial 7 subtraction starting at 100 (0/3) 3 3 3   Language: Repeat phrase (0/2) 1 2 2   Language : Fluency (0/1) 1 1 1   Abstraction (0/2) 2 2 2   Delayed Recall (0/5) 2  2 2  Orientation (0/6) 5 6 5   Total 25 26 26   Adjusted Score (based on education) 25 26 26    MMSE: MMSE - Mini Mental State Exam 07/27/2015  Orientation to time 4  Orientation to Place 5  Registration 3  Attention/ Calculation 5  Recall 3  Language- name 2 objects 2  Language- repeat 1  Language- follow 3 step command 3  Language-  read & follow direction 1  Write a sentence 1  Copy design 1  Total score 29      Attention span & concentration ability appears normal.  Speech is fluent, without dysarthria, but with strong dysphonia or aphasia.  Mood and affect are appropriate.  Cranial nerves: Pupils are equal and briskly reactive to light.  Extraocular movements  in vertical and horizontal planes intact and without nystagmus. Visual fields by finger perimetry are intact. Hearing; hearing aids in place.    Facial sensation intact to fine touch.  Facial motor strength is symmetric and tongue and uvula move midline. Shoulder shrug was symmetrical.   Motor exam: Normal tone, muscle bulk and symmetric strength in all extremities.  Sensory:  Fine touch, pinprick and vibration were tested in all extremities. Proprioception tested in the upper extremities was normal.  Coordination: Rapid alternating movements in the fingers/hands was normal. Finger-to-nose maneuver normal without evidence of ataxia, dysmetria or tremor.  Gait and station: Patient walks without assistive device and is able unassisted to climb up to the exam table. Strength within normal limits.  Stance is stable and normal based   The patient rises from a seated position without bracing himself. He stands erect. Toe and heel stand were tested . Tandem gait is unfragmented. Turns with 3 Steps. Romberg testing is negative.  Deep tendon reflexes: in the upper and lower extremities are symmetric and intact. Babinski maneuver response is  downgoing.  The patient was advised of the nature of the diagnosed sleep disorder , the treatment options and risks for general a health and wellness arising from not treating the condition.  I spent more than 25 minutes of face to face time with the patient. Greater than 50% of time was spent in counseling and coordination of care. We have discussed the diagnosis and differential and I answered the patient's questions.  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 He walks fast paced every day. He should continue.    Assessment:  Cardiology note- PMH: 1. Hyperlipidemia: He has been unable to tolerate statins.  2. Chronic atrial bigeminy: x years.  3. Hypothyroidism: s/p surgery for pituitary adenoma.  4. Pituitary adenoma: s/p surgery in 3/11 for removal. Now on thyroid replacement and hydrocortisone.  5. Cardiomyopathy: Nonischemic. LHC (2009) with 50% mid LAD, 60% distal LAD. Left ventriculogram (2009) with EF 40-45%. Echo (8/13) with EF 35-40%, mild LVH. Echo (11/14) with EF 40%, diffuse hypokinesis, normal RV. He was tried in the past on an ACEI but apparently at that time began to have side effects from his pituitary adenoma that were thought initially to be ACEI-related. He has been off ACEI since that time. He has had trouble tolerating more that 1/2 tab of 3.125 mg Coreg once a day. Echo (11/15) with EF 55-60%, aortic sclerosis without significant stenosis.  6. CAD: 2009 LHC with 50% mid, 60% distal LAD. NSTEMI 11/15 with 60-70% pLAD, totally occluded LCx with DES to proximal LCx. EF 55-60% by LV-gram.  7. Mild cognitive impairment.     After physical and neurologic examination, review of laboratory studies,  Personal review of imaging studies, reports of other /same  Imaging studies, Results of polysomnography/ neurophysiology testing and pre-existing records as far as provided in visit., my assessment is   1) MCI, borderline, one point lost on MOCA could be due to hearing deficit.   2) OCD- he sees no need to treat.    Plan:  Treatment plan and additional workup :  Follow yearly for [facility] and MMSE.     Asencion Partridge Antone Summons MD  07/27/2015   CC: [name], [place], [place] [address]  [place]; [name], MD

## 2015-07-28 ENCOUNTER — Encounter: Payer: Self-pay | Admitting: Cardiology

## 2015-07-28 ENCOUNTER — Ambulatory Visit (HOSPITAL_COMMUNITY)
Admission: RE | Admit: 2015-07-28 | Discharge: 2015-07-29 | Disposition: A | Discharge status: Home / Self Care | Payer: Medicare Other | Source: Ambulatory Visit | Attending: Cardiovascular Disease | Admitting: Cardiovascular Disease

## 2015-07-28 ENCOUNTER — Other Ambulatory Visit: Payer: Self-pay

## 2015-07-28 ENCOUNTER — Other Ambulatory Visit: Payer: Self-pay | Admitting: Cardiology

## 2015-07-28 ENCOUNTER — Encounter (HOSPITAL_COMMUNITY): Payer: Self-pay | Admitting: *Deleted

## 2015-07-28 ENCOUNTER — Encounter (HOSPITAL_COMMUNITY)
Admission: RE | Disposition: A | Discharge status: Home / Self Care | Payer: Self-pay | Source: Ambulatory Visit | Attending: Cardiovascular Disease

## 2015-07-28 ENCOUNTER — Ambulatory Visit (HOSPITAL_BASED_OUTPATIENT_CLINIC_OR_DEPARTMENT_OTHER): Payer: Medicare Other

## 2015-07-28 DIAGNOSIS — Z23 Encounter for immunization: Secondary | ICD-10-CM | POA: Insufficient documentation

## 2015-07-28 DIAGNOSIS — I252 Old myocardial infarction: Secondary | ICD-10-CM | POA: Insufficient documentation

## 2015-07-28 DIAGNOSIS — R9439 Abnormal result of other cardiovascular function study: Secondary | ICD-10-CM | POA: Diagnosis present

## 2015-07-28 DIAGNOSIS — I2510 Atherosclerotic heart disease of native coronary artery without angina pectoris: Secondary | ICD-10-CM

## 2015-07-28 DIAGNOSIS — Z8249 Family history of ischemic heart disease and other diseases of the circulatory system: Secondary | ICD-10-CM | POA: Insufficient documentation

## 2015-07-28 DIAGNOSIS — N400 Enlarged prostate without lower urinary tract symptoms: Secondary | ICD-10-CM | POA: Insufficient documentation

## 2015-07-28 DIAGNOSIS — E785 Hyperlipidemia, unspecified: Secondary | ICD-10-CM | POA: Diagnosis not present

## 2015-07-28 DIAGNOSIS — I2089 Other forms of angina pectoris: Secondary | ICD-10-CM | POA: Insufficient documentation

## 2015-07-28 DIAGNOSIS — I25118 Atherosclerotic heart disease of native coronary artery with other forms of angina pectoris: Secondary | ICD-10-CM | POA: Insufficient documentation

## 2015-07-28 DIAGNOSIS — I428 Other cardiomyopathies: Secondary | ICD-10-CM

## 2015-07-28 DIAGNOSIS — I2584 Coronary atherosclerosis due to calcified coronary lesion: Secondary | ICD-10-CM | POA: Diagnosis not present

## 2015-07-28 DIAGNOSIS — I208 Other forms of angina pectoris: Secondary | ICD-10-CM

## 2015-07-28 DIAGNOSIS — E039 Hypothyroidism, unspecified: Secondary | ICD-10-CM | POA: Diagnosis not present

## 2015-07-28 DIAGNOSIS — R002 Palpitations: Secondary | ICD-10-CM | POA: Diagnosis not present

## 2015-07-28 DIAGNOSIS — Z955 Presence of coronary angioplasty implant and graft: Secondary | ICD-10-CM

## 2015-07-28 HISTORY — PX: CARDIAC CATHETERIZATION: SHX172

## 2015-07-28 LAB — MYOCARDIAL PERFUSION IMAGING
CHL CUP NUCLEAR SDS: 13
CHL CUP RESTING HR STRESS: 56 {beats}/min
CHL RATE OF PERCEIVED EXERTION: 19
CSEPEDS: 0 s
CSEPEW: 7 METS
Exercise duration (min): 6 min
LV dias vol: 158 mL
LV sys vol: 89 mL
MPHR: 142 {beats}/min
NUC STRESS TID: 1.11
Peak HR: 126 {beats}/min
Percent HR: 88 %
RATE: 0.33
SRS: 5
SSS: 18

## 2015-07-28 LAB — BASIC METABOLIC PANEL
Anion gap: 10 (ref 5–15)
BUN: 18 mg/dL (ref 6–20)
CHLORIDE: 104 mmol/L (ref 101–111)
CO2: 24 mmol/L (ref 22–32)
CREATININE: 1.07 mg/dL (ref 0.61–1.24)
Calcium: 9.3 mg/dL (ref 8.9–10.3)
GFR calc non Af Amer: >60 mL/min (ref >60)
Glucose, Bld: 95 mg/dL (ref 65–99)
POTASSIUM: 4 mmol/L (ref 3.5–5.1)
Sodium: 138 mmol/L (ref 135–145)

## 2015-07-28 LAB — CBC
HEMATOCRIT: 45.6 % (ref 39.0–52.0)
Hemoglobin: 15.7 g/dL (ref 13.0–17.0)
MCH: 31.5 pg (ref 26.0–34.0)
MCHC: 34.4 g/dL (ref 30.0–36.0)
MCV: 91.6 fL (ref 78.0–100.0)
PLATELETS: 201 10*3/uL (ref 150–400)
RBC: 4.98 MIL/uL (ref 4.22–5.81)
RDW: 13.7 % (ref 11.5–15.5)
WBC: 7.5 10*3/uL (ref 4.0–10.5)

## 2015-07-28 LAB — POCT ACTIVATED CLOTTING TIME
Activated Clotting Time: 110 seconds
Activated Clotting Time: 356 seconds

## 2015-07-28 LAB — PROTIME-INR
INR: 1.18 (ref 0.00–1.49)
Prothrombin Time: 15.2 seconds (ref 11.6–15.2)

## 2015-07-28 SURGERY — LEFT HEART CATH AND CORONARY ANGIOGRAPHY

## 2015-07-28 MED ORDER — SODIUM CHLORIDE 0.9 % IV SOLN: 250.0000 mL | INTRAVENOUS | Status: DC | PRN

## 2015-07-28 MED ORDER — MIDAZOLAM HCL 2 MG/2ML IJ SOLN
INTRAMUSCULAR | Status: DC | PRN
  Administered 2015-07-28: 1 mg via INTRAVENOUS

## 2015-07-28 MED ORDER — ASPIRIN 81 MG PO CHEW
CHEWABLE_TABLET | ORAL | Status: AC
  Filled 2015-07-28: qty 1

## 2015-07-28 MED ORDER — FENTANYL CITRATE (PF) 100 MCG/2ML IJ SOLN
INTRAMUSCULAR | Status: AC
  Filled 2015-07-28: qty 4

## 2015-07-28 MED ORDER — SODIUM CHLORIDE 0.9 % WEIGHT BASED INFUSION
3.0000 mL/kg/h | INTRAVENOUS | Status: DC
  Administered 2015-07-28: 3 mL/kg/h via INTRAVENOUS

## 2015-07-28 MED ORDER — ACETAMINOPHEN 325 MG PO TABS
650.0000 mg | ORAL_TABLET | ORAL | Status: DC | PRN
  Administered 2015-07-28 (×2): 650 mg via ORAL
  Filled 2015-07-28: qty 2

## 2015-07-28 MED ORDER — TECHNETIUM TC 99M SESTAMIBI GENERIC - CARDIOLITE
31.9000 | Freq: Once | INTRAVENOUS | Status: AC | PRN
  Administered 2015-07-28: 31.9 via INTRAVENOUS

## 2015-07-28 MED ORDER — ASPIRIN 81 MG PO CHEW
81.0000 mg | CHEWABLE_TABLET | ORAL | Status: AC
  Administered 2015-07-28: 81 mg via ORAL

## 2015-07-28 MED ORDER — IOHEXOL 350 MG/ML SOLN
INTRAVENOUS | Status: DC | PRN
  Administered 2015-07-28: 210 mL via INTRACARDIAC

## 2015-07-28 MED ORDER — MIDAZOLAM HCL 2 MG/2ML IJ SOLN: INTRAMUSCULAR | Status: DC | PRN

## 2015-07-28 MED ORDER — MIDAZOLAM HCL 2 MG/2ML IJ SOLN
INTRAMUSCULAR | Status: AC
  Filled 2015-07-28: qty 4

## 2015-07-28 MED ORDER — BIVALIRUDIN BOLUS VIA INFUSION - CUPID
INTRAVENOUS | Status: DC | PRN
  Administered 2015-07-28: 77.55 mg via INTRAVENOUS

## 2015-07-28 MED ORDER — FENTANYL CITRATE (PF) 100 MCG/2ML IJ SOLN
INTRAMUSCULAR | Status: DC | PRN
  Administered 2015-07-28 (×3): 25 ug via INTRAVENOUS

## 2015-07-28 MED ORDER — FENTANYL CITRATE (PF) 100 MCG/2ML IJ SOLN
INTRAMUSCULAR | Status: DC | PRN
  Administered 2015-07-28 (×2): 25 ug via INTRAVENOUS

## 2015-07-28 MED ORDER — ONDANSETRON HCL 4 MG/2ML IJ SOLN: 4.0000 mg | Freq: Four times a day (QID) | INTRAMUSCULAR | Status: DC | PRN

## 2015-07-28 MED ORDER — NITROGLYCERIN 1 MG/10 ML FOR IR/CATH LAB
INTRA_ARTERIAL | Status: AC
  Filled 2015-07-28: qty 10

## 2015-07-28 MED ORDER — HEPARIN (PORCINE) IN NACL 2-0.9 UNIT/ML-% IJ SOLN
INTRAMUSCULAR | Status: AC
  Filled 2015-07-28: qty 500

## 2015-07-28 MED ORDER — SODIUM CHLORIDE 0.9 % IJ SOLN: 3.0000 mL | Freq: Two times a day (BID) | INTRAMUSCULAR | Status: DC

## 2015-07-28 MED ORDER — TICAGRELOR 90 MG PO TABS
ORAL_TABLET | ORAL | Status: AC
  Filled 2015-07-28: qty 1

## 2015-07-28 MED ORDER — NITROGLYCERIN 1 MG/10 ML FOR IR/CATH LAB
INTRA_ARTERIAL | Status: DC | PRN
  Administered 2015-07-28: 16:00:00

## 2015-07-28 MED ORDER — HEPARIN SODIUM (PORCINE) 1000 UNIT/ML IJ SOLN
INTRAMUSCULAR | Status: AC
  Filled 2015-07-28: qty 1

## 2015-07-28 MED ORDER — ASPIRIN EC 81 MG PO TBEC
81.0000 mg | DELAYED_RELEASE_TABLET | Freq: Every day | ORAL | Status: DC
Start: 2015-07-29 — End: 2015-07-29

## 2015-07-28 MED ORDER — SODIUM CHLORIDE 0.9 % IJ SOLN: 3.0000 mL | INTRAMUSCULAR | Status: DC | PRN

## 2015-07-28 MED ORDER — LIDOCAINE HCL (PF) 1 % IJ SOLN
INTRAMUSCULAR | Status: AC
  Filled 2015-07-28: qty 30

## 2015-07-28 MED ORDER — ACETAMINOPHEN 325 MG PO TABS
ORAL_TABLET | ORAL | Status: AC
  Filled 2015-07-28: qty 2

## 2015-07-28 MED ORDER — BIVALIRUDIN 250 MG IV SOLR
INTRAVENOUS | Status: AC
  Filled 2015-07-28: qty 250

## 2015-07-28 MED ORDER — INFLUENZA VAC SPLIT QUAD 0.5 ML IM SUSY
0.5000 mL | PREFILLED_SYRINGE | INTRAMUSCULAR | Status: DC
  Filled 2015-07-28: qty 0.5

## 2015-07-28 MED ORDER — HEPARIN (PORCINE) IN NACL 2-0.9 UNIT/ML-% IJ SOLN
INTRAMUSCULAR | Status: AC
  Filled 2015-07-28: qty 1500

## 2015-07-28 MED ORDER — TICAGRELOR 90 MG PO TABS
90.0000 mg | ORAL_TABLET | Freq: Two times a day (BID) | ORAL | Status: DC
  Administered 2015-07-28: 21:00:00 90 mg via ORAL
  Filled 2015-07-28: qty 1

## 2015-07-28 MED ORDER — TECHNETIUM TC 99M SESTAMIBI GENERIC - CARDIOLITE
10.2000 | Freq: Once | INTRAVENOUS | Status: AC | PRN
  Administered 2015-07-28: 10 via INTRAVENOUS

## 2015-07-28 MED ORDER — VERAPAMIL HCL 2.5 MG/ML IV SOLN
INTRAVENOUS | Status: AC
  Filled 2015-07-28: qty 2

## 2015-07-28 MED ORDER — SODIUM CHLORIDE 0.9 % IV SOLN
250.0000 mg | INTRAVENOUS | Status: DC | PRN
  Administered 2015-07-28 (×2): 1.75 mg/kg/h via INTRAVENOUS

## 2015-07-28 MED ORDER — ANGIOPLASTY BOOK
Freq: Once | Status: AC
  Administered 2015-07-28: 21:00:00
  Filled 2015-07-28: qty 1

## 2015-07-28 MED ORDER — SODIUM CHLORIDE 0.9 % IV SOLN: INTRAVENOUS | Status: AC

## 2015-07-28 MED ORDER — MIDAZOLAM HCL 2 MG/2ML IJ SOLN
INTRAMUSCULAR | Status: DC | PRN
  Administered 2015-07-28 (×2): 1 mg via INTRAVENOUS

## 2015-07-28 MED ORDER — SODIUM CHLORIDE 0.9 % WEIGHT BASED INFUSION
1.0000 mL/kg/h | INTRAVENOUS | Status: DC
Start: 2015-07-29 — End: 2015-07-28

## 2015-07-28 MED ORDER — HEPARIN (PORCINE) IN NACL 2-0.9 UNIT/ML-% IJ SOLN
INTRAMUSCULAR | Status: DC | PRN
Start: 2015-07-28 — End: 2015-07-28
  Administered 2015-07-28: 14:00:00 via INTRA_ARTERIAL

## 2015-07-28 MED ORDER — HEPARIN SODIUM (PORCINE) 1000 UNIT/ML IJ SOLN
INTRAMUSCULAR | Status: DC | PRN
Start: 2015-07-28 — End: 2015-07-28
  Administered 2015-07-28: 5000 [IU] via INTRAVENOUS

## 2015-07-28 MED ORDER — TICAGRELOR 90 MG PO TABS
ORAL_TABLET | ORAL | Status: DC | PRN
  Administered 2015-07-28: 90 mg via ORAL

## 2015-07-28 SURGICAL SUPPLY — 23 items
BALLN ANGIOSCULPT RX 2.0X10 (BALLOONS) ×2
BALLN ANGIOSCULPT RX 2.5X15 (BALLOONS) ×2
BALLN ~~LOC~~ EUPHORA RX 3.5X15 (BALLOONS) ×2
BALLOON ANGIOSCULPT RX 2.0X10 (BALLOONS) IMPLANT
BALLOON ANGIOSCULPT RX 2.5X15 (BALLOONS) IMPLANT
BALLOON ~~LOC~~ EUPHORA RX 3.5X15 (BALLOONS) IMPLANT
CATH INFINITI 5 FR JL3.5 (CATHETERS) ×2 IMPLANT
CATH INFINITI 5FR ANG PIGTAIL (CATHETERS) ×2 IMPLANT
CATH INFINITI 5FR JL4 (CATHETERS) ×1 IMPLANT
CATH INFINITI JR4 5F (CATHETERS) ×2 IMPLANT
CATH VISTA GUIDE 6FR XBLAD3.5 (CATHETERS) ×1 IMPLANT
DEVICE RAD COMP TR BAND LRG (VASCULAR PRODUCTS) ×2 IMPLANT
GLIDESHEATH SLEND SS 6F .021 (SHEATH) ×2 IMPLANT
KIT ENCORE 26 ADVANTAGE (KITS) ×1 IMPLANT
KIT HEART LEFT (KITS) ×2 IMPLANT
PACK CARDIAC CATHETERIZATION (CUSTOM PROCEDURE TRAY) ×2 IMPLANT
STENT XIENCE ALPINE RX 3.0X33 (Permanent Stent) ×1 IMPLANT
SYR MEDRAD MARK V 150ML (SYRINGE) ×2 IMPLANT
TRANSDUCER W/STOPCOCK (MISCELLANEOUS) ×2 IMPLANT
TUBING CIL FLEX 10 FLL-RA (TUBING) ×2 IMPLANT
WIRE COUGAR XT STRL 300CM (WIRE) ×1 IMPLANT
WIRE HI TORQ VERSACORE-J 145CM (WIRE) ×3 IMPLANT
WIRE SAFE-T 1.5MM-J .035X260CM (WIRE) ×2 IMPLANT

## 2015-07-28 NOTE — H&P (View-Only) (Signed)
Admit date: (Not on file) Referring Physician: Dr. Aundra Dubin Primary Cardiologist: Dr. Aundra Dubin Chief complaint/reason for admission:Chest pain  HPI: 81 yo with history of pituitary adenoma s/p resection, nonobstructive CAD, nonischemic cardiomyopathy, and chronic atrial bigeminy presents for cardiology followup. Patient has a long history of atrial bigeminy. He is in this today. He had had a nonischemic cardiomyopathy with EF around 40% but most recent echo showed normal EF. Historically, he has not been able to tolerate statins, and he has been tried on multiple agents. He is now on Repatha.   In 11/15, he developed chest tightness and was admitted to [facility] with NSTEMI. LHC showed 60-70% proximal LAD stenosis with totally occluded LCx. This was opened with DES to the proximal LCx. Echo post-cath showed EF 55-60%. He can only tolerate Coreg 3.125 mg 1/2 tab twice a day. He has had difficulty with BP-active meds in the setting of adrenal insufficiency post pituitary surgery.   He recently saw Dr. Aundra Dubin and complained that for 2-3 months, he has noted right arm and central chest mild tightness when walking up a hill. He notices this walking to [place] football games mainly. He does fine on flat ground. Does a lot of yardwork without problems. Works out at gym without problems. Weight is down 4 lbs. No dyspnea. He presented today for nuclear stress test.  He developed chest pain at peak exercise with ischemic downsloping ST segments with T wave inversions in the inferolateral leads in recovery.  His CP resolved with rest.  Nuclear images showed a reversible defect in the distal anterolateral, distal lateral and apical myocardium.  There was a partially reversible defect in the basal and mid inferoseptum and apical anteroseptal myocardium.  He currently is pain free.      PMH:    Past Medical History  Diagnosis Date  . Hyperlipidemia   . BPH (benign prostatic hypertrophy)   .  Non-ischemic cardiomyopathy ([place])   . Atrial premature beats   . Coronary artery disease   . NSTEMI (non-ST elevated myocardial infarction) ([place]) 08/25/2014    PMH: 1. Hyperlipidemia: He has been unable to tolerate statins.  2. Chronic atrial bigeminy: x years.  3. Hypothyroidism: s/p surgery for pituitary adenoma.  4. Pituitary adenoma: s/p surgery in 3/11 for removal. Now on thyroid replacement and hydrocortisone.  5. Cardiomyopathy: Nonischemic. LHC (2009) with 50% mid LAD, 60% distal LAD. Left ventriculogram (2009) with EF 40-45%. Echo (8/13) with EF 35-40%, mild LVH. Echo (11/14) with EF 40%, diffuse hypokinesis, normal RV. He was tried in the past on an ACEI but apparently at that time began to have side effects from his pituitary adenoma that were thought initially to be ACEI-related. He has been off ACEI since that time. He has had trouble tolerating more that 1/2 tab of 3.125 mg Coreg once a day. Echo (11/15) with EF 55-60%, aortic sclerosis without significant stenosis.  6. CAD: 2009 LHC with 50% mid, 60% distal LAD. NSTEMI 11/15 with 60-70% pLAD, totally occluded LCx with DES to proximal LCx. EF 55-60% by LV-gram.  7. Mild cognitive impairment.  8. Carotid stenosis: Carotid dopplers (1/16) with 40-59% RICA stenosis.   PSH:    Past Surgical History  Procedure Laterality Date  . Hand surgery Left   . Cataract extraction    . Back surgery    . Hammer  bunion toe surgery    . Pituitary surgery      12/01/09 Dr. Wilburn Cornelia and [name].  Marland Kitchen  Arthroscopic surgery    . Left heart catheterization with coronary angiogram N/A 08/25/2014    Procedure: LEFT HEART CATHETERIZATION WITH CORONARY ANGIOGRAM;  Surgeon: Burnell Blanks, MD;  Location: [facility] CATH LAB;  Service: Cardiovascular;  Laterality: N/A;    ALLERGIES:   Sulfa antibiotics; Sulfonamide derivatives; and Tamsulosin  Prior to Admit Meds:   (Not in a hospital admission) Family HX:    Family History  Problem  Relation Age of Onset  . Congestive Heart Failure Father 84  . Other Mother 46  . Coronary artery disease Brother   . Other Sister 33    polio   Social HX:    Social History   Social History  . Marital Status: Married    Spouse Name: [name]  . Number of Children: 2  . Years of Education: College   Occupational History  . retired Clinical biochemist    Social History Main Topics  . Smoking status: Never Smoker   . Smokeless tobacco: Never Used  . Alcohol Use: Yes     Comment: occas.  . Drug Use: No  . Sexual Activity: Not on file   Other Topics Concern  . Not on file   Social History Narrative   Patient is married [name]).   Patient has two children.   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   Patient is right-handed.   Patient does not drink any caffeine.     ROS:  All 11 ROS were addressed and are negative except what is stated in the HPI  PHYSICAL EXAM There were no vitals filed for this visit. General: Well developed, well nourished, in no acute distress Head: Eyes PERRLA, No xanthomas.   Normal cephalic and atramatic  Lungs:   Clear bilaterally to auscultation and percussion. Heart:   HRRR S1 S2 Pulses are 2+ & equal.            No carotid bruit. No JVD.  No abdominal bruits. No femoral bruits. Abdomen: Bowel sounds are positive, abdomen soft and non-tender without masses  Extremities:   No clubbing, cyanosis or edema.  DP +1 Neuro: Alert and oriented X 3. Psych:  Good affect, responds appropriately   Labs:   Lab Results  Component Value Date   WBC 8.6 02/17/2015   HGB 14.7 02/17/2015   HCT 42.7 02/17/2015   MCV 89.6 02/17/2015   PLT 202.0 02/17/2015   No results for input(s): NA, K, CL, CO2, BUN, CREATININE, CALCIUM, PROT, BILITOT, ALKPHOS, ALT, AST, GLUCOSE in the last 168 hours.  Invalid input(s): LABALBU Lab Results  Component Value Date   CKTOTAL 934* 08/27/2014   CKMB 36.5* 08/27/2014   TROPONINI >20.00* 08/26/2014    No results found for: PTT Lab Results  Component Value Date   INR 1.08 02/13/2011     Lab Results  Component Value Date   CHOL 156 01/13/2015   CHOL 246* 11/11/2014   Lab Results  Component Value Date   HDL 37.10* 01/13/2015   HDL 33.00* 11/11/2014   Lab Results  Component Value Date   LDLCALC 80 01/13/2015   Lab Results  Component Value Date   TRIG 193.0* 01/13/2015   TRIG 251.0* 11/11/2014   Lab Results  Component Value Date   CHOLHDL 4 01/13/2015   CHOLHDL 7 11/11/2014   Lab Results  Component Value Date   LDLDIRECT 155.0 11/11/2014      Radiology:  No results found.  Assessment/Plan: 1. Cardiomyopathy: EF 40% with diffuse  hypokinesis on 11/14 echo, but echo post-NSTEMI actually showed EF up to 55-60%. He is euvolemic with NYHA class I symptoms. Continue Coreg, cannot tolerate uptitration.  2. CAD: s/p NSTEMI. He has not tolerated multiple statins but is now on Repatha. He is having what sounds like mild angina with relatively heavy exertion. - Will continue ASA 81 and Brilinta 90 mg bid. Nuclear stress test with CP in association with ischemic downsloping ST segment depression in the inferolateral leads in recovery and ischemia in the inferoseptal, anteroseptal, distal anterior and lateral walls and apex.  I have recommended that we proceed with cardiac cath today to redefine coronary anatomy.  I suspect he may have had progression of LAD disease as well as possible restenosis of LCx stent.   - Continue Coreg to 3.125 mg 1/2 tab bid  - Continue Repatha. 3. Hyperlipidemia: He has not tolerated multiple statins. LDL much improved on Repatha. 4. Carotid bruit: On right. 40-59% RICA stenosis on carotid dopplers. Repeat in 1/17.  5. Atrial bigeminy: Chronic. EF is normal on echo. He does not tolerate higher doses of beta blockers.     Sueanne Margarita, MD  07/28/2015  11:03 AM

## 2015-07-28 NOTE — Progress Notes (Signed)
Patient Information: 1. Patients With Known Obstructive CAD (e.g., Prior MI, Prior PCI, Prior CABG, or Obstructive Disease on Invasive Angiography) 2. Post Revascularization (PCI or CABG) 3. Intermediate-risk noninvasive findings 4. Worsening or limiting symptoms A (7) Indication: 54; 

## 2015-07-28 NOTE — H&P (Signed)
Admit date: (Not on file) Referring Physician: Dr. Aundra Dubin Primary Cardiologist: Dr. Aundra Dubin Chief complaint/reason for admission:Chest pain  HPI: 81 yo with history of pituitary adenoma s/p resection, nonobstructive CAD, nonischemic cardiomyopathy, and chronic atrial bigeminy presents for cardiology followup. Patient has a long history of atrial bigeminy. He is in this today. He had had a nonischemic cardiomyopathy with EF around 40% but most recent echo showed normal EF. Historically, he has not been able to tolerate statins, and he has been tried on multiple agents. He is now on Repatha.   In 11/15, he developed chest tightness and was admitted to [facility] with NSTEMI. LHC showed 60-70% proximal LAD stenosis with totally occluded LCx. This was opened with DES to the proximal LCx. Echo post-cath showed EF 55-60%. He can only tolerate Coreg 3.125 mg 1/2 tab twice a day. He has had difficulty with BP-active meds in the setting of adrenal insufficiency post pituitary surgery.   He recently saw Dr. Aundra Dubin and complained that for 2-3 months, he has noted right arm and central chest mild tightness when walking up a hill. He notices this walking to [place] football games mainly. He does fine on flat ground. Does a lot of yardwork without problems. Works out at gym without problems. Weight is down 4 lbs. No dyspnea. He presented today for nuclear stress test.  He developed chest pain at peak exercise with ischemic downsloping ST segments with T wave inversions in the inferolateral leads in recovery.  His CP resolved with rest.  Nuclear images showed a reversible defect in the distal anterolateral, distal lateral and apical myocardium.  There was a partially reversible defect in the basal and mid inferoseptum and apical anteroseptal myocardium.  He currently is pain free.      PMH:    Past Medical History  Diagnosis Date  . Hyperlipidemia   . BPH (benign prostatic hypertrophy)   .  Non-ischemic cardiomyopathy ([place])   . Atrial premature beats   . Coronary artery disease   . NSTEMI (non-ST elevated myocardial infarction) ([place]) 08/25/2014    PMH: 1. Hyperlipidemia: He has been unable to tolerate statins.  2. Chronic atrial bigeminy: x years.  3. Hypothyroidism: s/p surgery for pituitary adenoma.  4. Pituitary adenoma: s/p surgery in 3/11 for removal. Now on thyroid replacement and hydrocortisone.  5. Cardiomyopathy: Nonischemic. LHC (2009) with 50% mid LAD, 60% distal LAD. Left ventriculogram (2009) with EF 40-45%. Echo (8/13) with EF 35-40%, mild LVH. Echo (11/14) with EF 40%, diffuse hypokinesis, normal RV. He was tried in the past on an ACEI but apparently at that time began to have side effects from his pituitary adenoma that were thought initially to be ACEI-related. He has been off ACEI since that time. He has had trouble tolerating more that 1/2 tab of 3.125 mg Coreg once a day. Echo (11/15) with EF 55-60%, aortic sclerosis without significant stenosis.  6. CAD: 2009 LHC with 50% mid, 60% distal LAD. NSTEMI 11/15 with 60-70% pLAD, totally occluded LCx with DES to proximal LCx. EF 55-60% by LV-gram.  7. Mild cognitive impairment.  8. Carotid stenosis: Carotid dopplers (1/16) with 40-59% RICA stenosis.   PSH:    Past Surgical History  Procedure Laterality Date  . Hand surgery Left   . Cataract extraction    . Back surgery    . Hammer  bunion toe surgery    . Pituitary surgery      12/01/09 Dr. Wilburn Cornelia and [name].  Marland Kitchen  Arthroscopic surgery    . Left heart catheterization with coronary angiogram N/A 08/25/2014    Procedure: LEFT HEART CATHETERIZATION WITH CORONARY ANGIOGRAM;  Surgeon: Burnell Blanks, MD;  Location: [facility] CATH LAB;  Service: Cardiovascular;  Laterality: N/A;    ALLERGIES:   Sulfa antibiotics; Sulfonamide derivatives; and Tamsulosin  Prior to Admit Meds:   (Not in a hospital admission) Family HX:    Family History  Problem  Relation Age of Onset  . Congestive Heart Failure Father 21  . Other Mother 99  . Coronary artery disease Brother   . Other Sister 35    polio   Social HX:    Social History   Social History  . Marital Status: Married    Spouse Name: [name]  . Number of Children: 2  . Years of Education: College   Occupational History  . retired Clinical biochemist    Social History Main Topics  . Smoking status: Never Smoker   . Smokeless tobacco: Never Used  . Alcohol Use: Yes     Comment: occas.  . Drug Use: No  . Sexual Activity: Not on file   Other Topics Concern  . Not on file   Social History Narrative   Patient is married [name]).   Patient has two children.   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   Patient is right-handed.   Patient does not drink any caffeine.     ROS:  All 11 ROS were addressed and are negative except what is stated in the HPI  PHYSICAL EXAM There were no vitals filed for this visit. General: Well developed, well nourished, in no acute distress Head: Eyes PERRLA, No xanthomas.   Normal cephalic and atramatic  Lungs:   Clear bilaterally to auscultation and percussion. Heart:   HRRR S1 S2 Pulses are 2+ & equal.            No carotid bruit. No JVD.  No abdominal bruits. No femoral bruits. Abdomen: Bowel sounds are positive, abdomen soft and non-tender without masses  Extremities:   No clubbing, cyanosis or edema.  DP +1 Neuro: Alert and oriented X 3. Psych:  Good affect, responds appropriately   Labs:   Lab Results  Component Value Date   WBC 8.6 02/17/2015   HGB 14.7 02/17/2015   HCT 42.7 02/17/2015   MCV 89.6 02/17/2015   PLT 202.0 02/17/2015   No results for input(s): NA, K, CL, CO2, BUN, CREATININE, CALCIUM, PROT, BILITOT, ALKPHOS, ALT, AST, GLUCOSE in the last 168 hours.  Invalid input(s): LABALBU Lab Results  Component Value Date   CKTOTAL 934* 08/27/2014   CKMB 36.5* 08/27/2014   TROPONINI >20.00* 08/26/2014    No results found for: PTT Lab Results  Component Value Date   INR 1.08 02/13/2011     Lab Results  Component Value Date   CHOL 156 01/13/2015   CHOL 246* 11/11/2014   Lab Results  Component Value Date   HDL 37.10* 01/13/2015   HDL 33.00* 11/11/2014   Lab Results  Component Value Date   LDLCALC 80 01/13/2015   Lab Results  Component Value Date   TRIG 193.0* 01/13/2015   TRIG 251.0* 11/11/2014   Lab Results  Component Value Date   CHOLHDL 4 01/13/2015   CHOLHDL 7 11/11/2014   Lab Results  Component Value Date   LDLDIRECT 155.0 11/11/2014      Radiology:  No results found.  Assessment/Plan: 1. Cardiomyopathy: EF 40% with diffuse  hypokinesis on 11/14 echo, but echo post-NSTEMI actually showed EF up to 55-60%. He is euvolemic with NYHA class I symptoms. Continue Coreg, cannot tolerate uptitration.  2. CAD: s/p NSTEMI. He has not tolerated multiple statins but is now on Repatha. He is having what sounds like mild angina with relatively heavy exertion. - Will continue ASA 81 and Brilinta 90 mg bid. Nuclear stress test with CP in association with ischemic downsloping ST segment depression in the inferolateral leads in recovery and ischemia in the inferoseptal, anteroseptal, distal anterior and lateral walls and apex.  I have recommended that we proceed with cardiac cath today to redefine coronary anatomy.  I suspect he may have had progression of LAD disease as well as possible restenosis of LCx stent.   - Continue Coreg to 3.125 mg 1/2 tab bid  - Continue Repatha. 3. Hyperlipidemia: He has not tolerated multiple statins. LDL much improved on Repatha. 4. Carotid bruit: On right. 40-59% RICA stenosis on carotid dopplers. Repeat in 1/17.  5. Atrial bigeminy: Chronic. EF is normal on echo. He does not tolerate higher doses of beta blockers.     Sueanne Margarita, MD  07/28/2015  11:03 AM

## 2015-07-28 NOTE — Interval H&P Note (Signed)
Cath Lab Visit (complete for each Cath Lab visit)  Clinical Evaluation Leading to the Procedure:   ACS: No.  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 After consideration of risks, benefits and other options for treatment, the patient has consented to  Procedure(s): Left Heart Cath and Coronary Angiography (N/A) as a surgical intervention .  The patient's history has been reviewed, patient examined, no change in status, stable for surgery.  I have reviewed the patient's chart and labs.  Questions were answered to the patient's satisfaction.     Shundra Wirsing [employer]

## 2015-07-28 NOTE — Progress Notes (Signed)
Wife in to see. Eating PB and crackers. Ambulates to BR to void  W/ease.

## 2015-07-29 ENCOUNTER — Ambulatory Visit: Payer: BLUE CROSS/BLUE SHIELD | Admitting: Neurology

## 2015-07-29 ENCOUNTER — Telehealth: Payer: Self-pay | Admitting: *Deleted

## 2015-07-29 ENCOUNTER — Encounter (HOSPITAL_COMMUNITY): Payer: Self-pay | Admitting: Physician Assistant

## 2015-07-29 ENCOUNTER — Other Ambulatory Visit: Payer: Self-pay

## 2015-07-29 DIAGNOSIS — Z8249 Family history of ischemic heart disease and other diseases of the circulatory system: Secondary | ICD-10-CM | POA: Diagnosis not present

## 2015-07-29 DIAGNOSIS — I208 Other forms of angina pectoris: Secondary | ICD-10-CM | POA: Diagnosis not present

## 2015-07-29 DIAGNOSIS — I2510 Atherosclerotic heart disease of native coronary artery without angina pectoris: Secondary | ICD-10-CM

## 2015-07-29 DIAGNOSIS — R931 Abnormal findings on diagnostic imaging of heart and coronary circulation: Secondary | ICD-10-CM

## 2015-07-29 DIAGNOSIS — R9439 Abnormal result of other cardiovascular function study: Secondary | ICD-10-CM | POA: Diagnosis present

## 2015-07-29 DIAGNOSIS — I2584 Coronary atherosclerosis due to calcified coronary lesion: Secondary | ICD-10-CM | POA: Diagnosis not present

## 2015-07-29 DIAGNOSIS — N400 Enlarged prostate without lower urinary tract symptoms: Secondary | ICD-10-CM | POA: Diagnosis not present

## 2015-07-29 DIAGNOSIS — Z955 Presence of coronary angioplasty implant and graft: Secondary | ICD-10-CM

## 2015-07-29 DIAGNOSIS — I25118 Atherosclerotic heart disease of native coronary artery with other forms of angina pectoris: Secondary | ICD-10-CM | POA: Diagnosis not present

## 2015-07-29 DIAGNOSIS — I252 Old myocardial infarction: Secondary | ICD-10-CM | POA: Diagnosis not present

## 2015-07-29 DIAGNOSIS — E785 Hyperlipidemia, unspecified: Secondary | ICD-10-CM | POA: Diagnosis not present

## 2015-07-29 LAB — BASIC METABOLIC PANEL
ANION GAP: 12 (ref 5–15)
BUN: 17 mg/dL (ref 6–20)
CALCIUM: 9 mg/dL (ref 8.9–10.3)
CO2: 23 mmol/L (ref 22–32)
Chloride: 103 mmol/L (ref 101–111)
Creatinine, Ser: 1.05 mg/dL (ref 0.61–1.24)
GFR calc Af Amer: >60 mL/min (ref >60)
GLUCOSE: 117 mg/dL — AB (ref 65–99)
Potassium: 4.1 mmol/L (ref 3.5–5.1)
SODIUM: 138 mmol/L (ref 135–145)

## 2015-07-29 LAB — CBC
HCT: 41.4 % (ref 39.0–52.0)
Hemoglobin: 14.1 g/dL (ref 13.0–17.0)
MCH: 31.2 pg (ref 26.0–34.0)
MCHC: 34.1 g/dL (ref 30.0–36.0)
MCV: 91.6 fL (ref 78.0–100.0)
Platelets: 196 10*3/uL (ref 150–400)
RBC: 4.52 MIL/uL (ref 4.22–5.81)
RDW: 13.5 % (ref 11.5–15.5)
WBC: 6.7 10*3/uL (ref 4.0–10.5)

## 2015-07-29 MED ORDER — CARVEDILOL 3.125 MG PO TABS: 1.5625 mg | ORAL_TABLET | Freq: Every day | ORAL | Status: DC

## 2015-07-29 MED ORDER — TADALAFIL 5 MG PO TABS: 5.0000 mg | ORAL_TABLET | Freq: Every day | ORAL | Status: DC | PRN

## 2015-07-29 NOTE — Progress Notes (Signed)
CARDIAC REHAB PHASE I   PRE:  Rate/Rhythm: 84 SB with PVCs    BP: sitting 146/69    SaO2: 99 RA  MODE:  Ambulation: 450 ft   POST:  Rate/Rhythm: 91 SR with PVCs    BP: sitting 153/71     SaO2:   Tolerated well, no c/o. Ed reviewed with good reception. Understands importance of Brilinta/ASA. Plans to attend [facility] again and will send referral to [place]   Darrick Meigs CES, ACSM 07/29/2015 8:32 AM

## 2015-07-29 NOTE — Discharge Instructions (Signed)

## 2015-07-29 NOTE — Discharge Summary (Signed)
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 MV for exertional chest pain>>admitted 10/27 for cath.   Cardiac catheterization results are below. He had DES to the ostial LAD and tolerated the procedure well.   On 10/27, he was seen by Dr Aundra Dubin and all data were reviewed. He was seen by cardiac rehab and ambulated without chest pain or SOB. He was educated on stent restrictions, heart-healthy lifestyle and exercise guidelines. No further inpatient workup is indicated and he is considered stable for discharge, to follow up as an outpatient.    Labs:   Lab Results  Component Value Date   WBC 6.7 07/29/2015   HGB 14.1 07/29/2015   HCT 41.4 07/29/2015   MCV 91.6 07/29/2015   PLT 196 07/29/2015     Recent Labs Lab 07/29/15 0610  NA 138  K 4.1  CL 103  CO2 23  BUN 17  CREATININE 1.05  CALCIUM 9.0  GLUCOSE 117*    Recent Labs  07/28/15 1305  INR 1.18   Cardiac Cath: 07/27/2015  STENT XIENCE ALPINE RX 3.0X33  Dominance: Right   Left Main  No significant disease.     Left Anterior Descending  95% ostial LAD stenosis proximal to D1. Moderate D1 with luminal irregularities. 40% mid LAD stenosis. 40% distal LAD stenosis.     Left Circumflex  Patent proximal LCx stent. High OM1 with small to moderate 60% mid vessel stenosis. Small OM2 with subtotal occlusion at the ostium. 50% proximal PLOM.     Right Coronary Artery  Moderate 2nd acute marginal  with 70% ostial stenosis. Luminal irregularities in the RCA itself.       EF 55-60%, no regional wall motion abnormalities.   Diagnostic Diagram           Post-Intervention Diagram             EKG: 07/29/2015 SR, HR 60, PACs  FOLLOW UP PLANS AND APPOINTMENTS Allergies  Allergen Reactions  . Sulfa Antibiotics Nausea And Vomiting  . Sulfonamide Derivatives Nausea And Vomiting  . Tamsulosin     Unknown pt doesn't think he is allergic      Medication List    TAKE these medications        ALPRAZolam 0.5 MG tablet  Commonly known as:  XANAX  Take 0.5 mg by mouth as needed for anxiety.     aspirin 81 MG tablet  Take 81 mg by mouth daily.     carvedilol 3.125 MG tablet  Commonly known as:  COREG  Take 0.5 tablets (1.5625 mg total) by mouth at bedtime.     CoQ10 200 MG Caps  Take 1 capsule by mouth daily.     diazepam 5 MG tablet  Commonly known as:  VALIUM  Take 5 mg by mouth as needed (for MRI).     EPIPEN 2-PAK 0.3 mg/0.3 mL Soaj injection  Generic drug:  EPINEPHrine  Inject 1 Dose as directed as needed.  ergocalciferol 50000 UNITS capsule  Commonly known as:  VITAMIN D2  Take 50,000 Units by mouth 2 (two) times a week.     finasteride 5 MG tablet  Commonly known as:  PROSCAR  Take 5 mg by mouth daily.     hydrocortisone 10 MG tablet  Commonly known as:  CORTEF  Take 15 mg by mouth daily. One and on half tablet daily     levothyroxine 150 MCG tablet  Commonly known as:  SYNTHROID, LEVOTHROID  Take 150 mcg by mouth daily before breakfast.     nitroGLYCERIN 0.4 MG SL tablet  Commonly known as:  NITROSTAT  Place 1 tablet (0.4 mg total) under the tongue every 5 (five) minutes as needed for chest pain.     NYAMYC 100000 UNIT/GM Powd  Apply 1 g topically daily as needed (yeast infection).     Red Yeast Rice Extract 600 MG Caps  Take 600 mg by mouth daily.     REPATHA 140 MG/ML Sosy  Generic drug:  Evolocumab  Inject into the skin every 14  (fourteen) days.     SYSTANE BALANCE OP  Apply 2 drops to eye daily as needed (dry eye).     tadalafil 5 MG tablet  Commonly known as:  CIALIS  Take 1 tablet (5 mg total) by mouth daily as needed for erectile dysfunction. Do not take nitro within 48 hrs after taking cialis     testosterone cypionate 200 MG/ML injection  Commonly known as:  DEPOTESTOSTERONE CYPIONATE  Inject into the muscle every 21 ( twenty-one) days. 1.62 % injection     ticagrelor 90 MG Tabs tablet  Commonly known as:  BRILINTA  Take 1 tablet (90 mg total) by mouth 2 (two) times daily.     VESICARE 10 MG tablet  Generic drug:  solifenacin  Take 5 mg by mouth daily.        Discharge Instructions    Amb Referral to Cardiac Rehabilitation    Complete by:  As directed   Diagnosis:  PCI     Diet - low sodium heart healthy    Complete by:  As directed      Increase activity slowly    Complete by:  As directed           Follow-up Information    Follow up with [name], MD.   Specialty:  Cardiology   Why:  The office will call.   Contact information:   [address] [place] [address] [phone]       BRING ALL MEDICATIONS WITH YOU TO FOLLOW UP APPOINTMENTS  Time spent with patient to include physician time: 39 min Signed: Rosaria Ferries, PA-C 07/29/2015, 8:36 AM Co-Sign MD

## 2015-07-29 NOTE — Progress Notes (Signed)
Patient ID: [name], [name], male   DOB: 09/27/1934, 81 y.o.   MRN: [id number]   SUBJECTIVE: No complaints, no dyspnea or chest pain.   Scheduled Meds: . aspirin EC  81 mg Oral Daily  . Influenza vac split quadrivalent PF  0.5 mL Intramuscular Tomorrow-1000  . sodium chloride  3 mL Intravenous Q12H  . ticagrelor  90 mg Oral BID   Continuous Infusions:  PRN Meds:.sodium chloride, acetaminophen, ondansetron (ZOFRAN) IV, sodium chloride    Filed Vitals:   07/28/15 1922 07/28/15 2000 07/28/15 2100 07/29/15 0414  BP: 144/74 92/52 128/71 119/43  Pulse: 57   59  Temp: 97.4 F (36.3 C)   97.7 F (36.5 C)  TempSrc: Oral   Oral  Resp: 17 15 14 16   Height:      Weight:    231 lb 7.7 oz (105 kg)  SpO2: 100%   98%    Intake/Output Summary (Last 24 hours) at 07/29/15 0817 Last data filed at 07/29/15 0500  Gross per 24 hour  Intake    240 ml  Output   1505 ml  Net  -1265 ml    LABS: Basic Metabolic Panel:  Recent Labs  07/28/15 1305 07/29/15 0610  NA 138 138  K 4.0 4.1  CL 104 103  CO2 24 23  GLUCOSE 95 117*  BUN 18 17  CREATININE 1.07 1.05  CALCIUM 9.3 9.0   Liver Function Tests: No results for input(s): AST, ALT, ALKPHOS, BILITOT, PROT, ALBUMIN in the last 72 hours. No results for input(s): LIPASE, AMYLASE in the last 72 hours. CBC:  Recent Labs  07/28/15 1305 07/29/15 0610  WBC 7.5 6.7  HGB 15.7 14.1  HCT 45.6 41.4  MCV 91.6 91.6  PLT 201 196   Cardiac Enzymes: No results for input(s): CKTOTAL, CKMB, CKMBINDEX, TROPONINI in the last 72 hours. BNP: Invalid input(s): POCBNP D-Dimer: No results for input(s): DDIMER in the last 72 hours. Hemoglobin A1C: No results for input(s): HGBA1C in the last 72 hours. Fasting Lipid Panel: No results for input(s): CHOL, HDL, LDLCALC, TRIG, CHOLHDL, LDLDIRECT in the last 72 hours. Thyroid Function Tests: No results for input(s): TSH, T4TOTAL, T3FREE, THYROIDAB in the last 72 hours.  Invalid input(s): FREET3 Anemia  Panel: No results for input(s): VITAMINB12, FOLATE, FERRITIN, TIBC, IRON, RETICCTPCT in the last 72 hours.  RADIOLOGY: No results found.  PHYSICAL EXAM General: NAD Neck: No JVD, no thyromegaly or thyroid nodule.  Lungs: Clear to auscultation bilaterally with normal respiratory effort. CV: Nondisplaced PMI.  Heart regular S1/S2, no S3/S4, no murmur.  No peripheral edema.  No carotid bruit.  Normal pedal pulses.  Abdomen: Soft, nontender, no hepatosplenomegaly, no distention.  Neurologic: Alert and oriented x 3.  Psych: Normal affect. Extremities: No clubbing or cyanosis. Right radial cath site benign.   TELEMETRY: Reviewed telemetry pt in NSR  ASSESSMENT AND PLAN: 81 yo with history of cardiomyopathy, PACs, and CAD now s/p PCI to LAD.  He had a very high risk stress test yesterday and was taken straight for cath, where 95% ostial LAD stenosis was discovered. This was treated with DES.  EF was preserved on LV-gram.  - He will go home on his prior home cardiac meds, was already on Brilinta. - He will need followup with me or PA in 2 wks.  - Needs to start cardiac rehab.  - Would like him to get a Lipomed profile in a couple of weeks as well.  - May be discharged  now.   [name] 07/29/2015 8:20 AM

## 2015-07-29 NOTE — Progress Notes (Signed)
TR BAND REMOVAL  LOCATION:    right radial  DEFLATED PER PROTOCOL:    Yes.    TIME BAND OFF / DRESSING APPLIED:    21:00   SITE UPON ARRIVAL:    Level 0  SITE AFTER BAND REMOVAL:    Level 0  REVERSE ALLEN'S TEST:     positive  CIRCULATION SENSATION AND MOVEMENT:    Within Normal Limits   Yes.    COMMENTS:   Pt tolerated removal of TR band without complications, will continue to monitor patient.

## 2015-07-29 NOTE — Telephone Encounter (Signed)
-----   Message from Lonn Georgia, PA-C sent at 07/29/2015  8:37 AM EDT ----- Lelon Frohlich: [name] needs a Lipomed profile in a couple of weeks. Can you order that please?   Schedulers: He also needs a PA/NP appointment, could we get those on the same day?   Thank you, Suanne Marker

## 2015-08-01 ENCOUNTER — Telehealth: Payer: Self-pay

## 2015-08-01 ENCOUNTER — Telehealth: Payer: Self-pay | Admitting: Cardiology

## 2015-08-01 NOTE — Telephone Encounter (Signed)
Prior auth for Cialis 5 mg sent to [facility]

## 2015-08-01 NOTE — Telephone Encounter (Signed)
The lab order is in [place],  [place] lipomed is [facility] now.

## 2015-08-01 NOTE — Telephone Encounter (Signed)
New message   1. Which medications are you calling about? Cialis 5 mg tablets   2. Which pharmacy/location is calling?[facility]  3. Do they need a 30 day or 90 day supply? Doesn't state on the request   Comments: Cheree Ditto called in this medication for the pt. It was denied because it is excluded from the part B coverage and that its not in his plan. [employer] wanted to the office to be aware.

## 2015-08-01 NOTE — Telephone Encounter (Signed)
New Message  Per staff messages. Scheduled appt with PA Cecilie Kicks on 08/11/2015 and Lab. Please place orders for lab.

## 2015-08-02 DIAGNOSIS — J3089 Other allergic rhinitis: Secondary | ICD-10-CM | POA: Diagnosis not present

## 2015-08-02 DIAGNOSIS — L309 Dermatitis, unspecified: Secondary | ICD-10-CM

## 2015-08-02 DIAGNOSIS — J301 Allergic rhinitis due to pollen: Secondary | ICD-10-CM | POA: Diagnosis not present

## 2015-08-02 DIAGNOSIS — J3081 Allergic rhinitis due to animal (cat) (dog) hair and dander: Secondary | ICD-10-CM | POA: Diagnosis not present

## 2015-08-02 HISTORY — DX: Dermatitis, unspecified: L30.9

## 2015-08-02 NOTE — Telephone Encounter (Signed)
Spoke with [employer] rep. States Cialis was denied. Apparently, there are no ED drugs that are covered. Left message for patient to call me back, to inform him of this.

## 2015-08-04 ENCOUNTER — Encounter: Payer: Medicare Other | Admitting: Cardiology

## 2015-08-04 ENCOUNTER — Other Ambulatory Visit: Payer: Medicare Other

## 2015-08-11 ENCOUNTER — Other Ambulatory Visit (INDEPENDENT_AMBULATORY_CARE_PROVIDER_SITE_OTHER): Payer: Medicare Other | Admitting: *Deleted

## 2015-08-11 ENCOUNTER — Ambulatory Visit (INDEPENDENT_AMBULATORY_CARE_PROVIDER_SITE_OTHER): Payer: Medicare Other | Admitting: Cardiology

## 2015-08-11 ENCOUNTER — Encounter: Payer: Self-pay | Admitting: Cardiology

## 2015-08-11 VITALS — BP 140/92 | HR 61 | Ht 75.0 in | Wt 234.0 lb

## 2015-08-11 DIAGNOSIS — I208 Other forms of angina pectoris: Secondary | ICD-10-CM | POA: Diagnosis not present

## 2015-08-11 DIAGNOSIS — Z955 Presence of coronary angioplasty implant and graft: Secondary | ICD-10-CM | POA: Diagnosis not present

## 2015-08-11 DIAGNOSIS — R0989 Other specified symptoms and signs involving the circulatory and respiratory systems: Secondary | ICD-10-CM | POA: Diagnosis not present

## 2015-08-11 DIAGNOSIS — E785 Hyperlipidemia, unspecified: Secondary | ICD-10-CM | POA: Diagnosis not present

## 2015-08-11 DIAGNOSIS — J3089 Other allergic rhinitis: Secondary | ICD-10-CM | POA: Diagnosis not present

## 2015-08-11 DIAGNOSIS — I2510 Atherosclerotic heart disease of native coronary artery without angina pectoris: Secondary | ICD-10-CM

## 2015-08-11 DIAGNOSIS — I429 Cardiomyopathy, unspecified: Secondary | ICD-10-CM

## 2015-08-11 DIAGNOSIS — J301 Allergic rhinitis due to pollen: Secondary | ICD-10-CM | POA: Diagnosis not present

## 2015-08-11 DIAGNOSIS — J3081 Allergic rhinitis due to animal (cat) (dog) hair and dander: Secondary | ICD-10-CM | POA: Diagnosis not present

## 2015-08-11 NOTE — Patient Instructions (Signed)
Medication Instructions:  Your physician recommends that you continue on your current medications as directed. Please refer to the Current Medication list given to you today.   Labwork: -None  Testing/Procedures: -None  Follow-Up: Your physician recommends that you keep your scheduled  follow-up appointment with Dr. Mclean   Any Other Special Instructions Will Be Listed Below (If Applicable).     If you need a refill on your cardiac medications before your next appointment, please call your pharmacy.   

## 2015-08-11 NOTE — Progress Notes (Addendum)
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 Aundra Dubin    Chief Complaint  Patient presents with  . Hospitalization Follow-up    + nuc study and now new stent, no pain.      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  Underwent PCI to LAD with Xience alpine stent.   He has done well since that time, discharged on the 28th.  He has a history of pituitary adenoma s/p resection, PCI CFX 08/2014, nonischemic cardiomyopathy w/ EF as low as 40%, and chronic atrial bigeminy.  He had no MI and on cath his EF 55-60%.  Today he has no complaints, no chest pain no SOB though he only mild angina prior to nuc. He does not wish to have cardiac rehab but will follow Dr. Claris Gladden recommendations.  Pt would only like to go once a week.    Pt is active actually planing a hunting trip in near future he does bird and deer hunting in [place].  He is eating healthy, and does farming and walking and exercises.  He continues on Repatha.     Past Medical History  Diagnosis Date  . Hyperlipidemia   . BPH (benign prostatic hypertrophy)   . Non-ischemic cardiomyopathy ([place])   . Atrial premature beats   . Coronary artery disease   . NSTEMI (non-ST elevated myocardial infarction) ([place]) 08/25/2014  . Status post insertion of drug-eluting stent into left anterior descending (LAD) artery 08/28/15    + nuc study.     Past Surgical History  Procedure Laterality Date  . Hand surgery Left   . Cataract extraction    . Back surgery    . Hammer  bunion toe surgery    . Pituitary surgery      12/01/09 Dr. Wilburn Cornelia and [name].  . Arthroscopic surgery    . Left heart catheterization with coronary angiogram N/A 08/25/2014    Procedure: LEFT HEART CATHETERIZATION WITH CORONARY ANGIOGRAM;  Surgeon:  Burnell Blanks, MD;  Location: [facility] CATH LAB;  Service: Cardiovascular;  Laterality: N/A;  . Cardiac catheterization  07/27/2015    Dr Aundra Dubin; oLAD 95%, mLAD 50%, CFX stent OK, OM1 60%, OM2 99%, pPLOM 50%, RCA irregular, AM2 70%, EF 55-60%, no regional wall motion abnormalities.     . Coronary angioplasty with stent placement  07/27/2015    XIENCE ALPINE RX 3.0X33 DES to the LAD, 95%>>0  . Cardiac catheterization N/A 07/28/2015    Procedure: Left Heart Cath and Coronary Angiography;  Surgeon: [name], MD;  Location: [place] CV LAB;  Service: Cardiovascular;  Laterality: N/A;  . Cardiac catheterization N/A 07/28/2015    Procedure: Coronary Stent Intervention;  Surgeon: Troy Sine, MD;  Location: [place] CV LAB;  Service: Cardiovascular;  Laterality: N/A;     Current Outpatient Prescriptions  Medication Sig Dispense Refill  . ALPRAZolam (XANAX) 0.5 MG tablet Take 0.5 mg by mouth as needed for anxiety.     Marland Kitchen aspirin 81 MG tablet Take 81 mg by mouth daily.      . carvedilol (COREG) 3.125 MG tablet Take 0.5 tablets (1.5625 mg total) by mouth at bedtime. 30 tablet 6  . Coenzyme Q10 (COQ10) 200 MG CAPS Take 1 capsule by  mouth daily.      . diazepam (VALIUM) 5 MG tablet Take 5 mg by mouth as needed (for MRI).     Marland Kitchen EPIPEN 2-PAK 0.3 MG/0.3ML SOAJ injection Inject 1 Dose as directed as needed.  0  . ergocalciferol (VITAMIN D2) 50000 UNITS capsule Take 50,000 Units by mouth 2 (two) times a week.     . Evolocumab (REPATHA) 140 MG/ML SOSY Inject into the skin every 14 (fourteen) days.    . finasteride (PROSCAR) 5 MG tablet Take 5 mg by mouth daily.      . hydrocortisone (CORTEF) 10 MG tablet Take 15 mg by mouth daily. One and on half tablet daily    . levothyroxine (SYNTHROID, LEVOTHROID) 150 MCG tablet Take 150 mcg by mouth daily before breakfast.    . nitroGLYCERIN (NITROSTAT) 0.4 MG SL tablet Place 1 tablet (0.4 mg total) under the tongue every 5 (five) minutes as needed for  chest pain. 90 tablet 3  . Nystatin (NYAMYC) 100000 UNIT/GM POWD Apply 1 g topically daily as needed (yeast infection).   10  . Propylene Glycol (SYSTANE BALANCE OP) Apply 2 drops to eye daily as needed (dry eye).    . Red Yeast Rice Extract 600 MG CAPS Take 600 mg by mouth daily.    . tadalafil (CIALIS) 5 MG tablet Take 1 tablet (5 mg total) by mouth daily as needed for erectile dysfunction. Do not take nitro within 48 hrs after taking cialis 10 tablet 1  . testosterone cypionate (DEPOTESTOTERONE CYPIONATE) 200 MG/ML injection Inject into the muscle every 21 ( twenty-one) days. 1.62 % injection    . ticagrelor (BRILINTA) 90 MG TABS tablet Take 1 tablet (90 mg total) by mouth 2 (two) times daily. 60 tablet 10  . VESICARE 10 MG tablet Take 5 mg by mouth daily.   10   No current facility-administered medications for this visit.    Allergies:   Sulfa antibiotics; Sulfonamide derivatives; and Tamsulosin    Social History:  The patient  reports that he has never smoked. He has never used smokeless tobacco. He reports that he drinks alcohol. He reports that he does not use illicit drugs.   Family History:  The patient's family history includes Congestive Heart Failure (age of onset: 31) in his father; Coronary artery disease in his brother; Other (age of onset: 51) in his sister; Other (age of onset: 40) in his mother.    ROS:  General:no colds or fevers, no weight changes Skin:no rashes or ulcers HEENT:no blurred vision, no congestion CV:see HPI PUL:see HPI GI:no diarrhea constipation or melena, no indigestion GU:no hematuria, no dysuria MS:no joint pain, no claudication Neuro:no syncope, no lightheadedness Endo:no diabetes, + thyroid disease   Wt Readings from Last 3 Encounters:  08/11/15 234 lb (106.142 kg)  07/29/15 231 lb 7.7 oz (105 kg)  07/28/15 228 lb (103.42 kg)     PHYSICAL EXAM: VS:  BP 140/92 mmHg  Pulse 61  Ht 6\' 3"  (1.905 m)  Wt 234 lb (106.142 kg)  BMI 29.25 kg/m2   SpO2 98% , BMI Body mass index is 29.25 kg/(m^2). General:Pleasant affect, NAD Skin:Warm and dry, brisk capillary refill HEENT:normocephalic, sclera clear, mucus membranes moist Neck:supple, no JVD, no bruits  Heart:S1S2 RRR without murmur, gallup, rub or click Lungs:clear without rales, rhonchi, or wheezes [id number], non tender, + BS, do not palpate liver spleen or masses Ext:no lower ext edema, 2+ pedal pulses, 2+ radial pulses Neuro:alert and oriented, MAE, follows commands, + facial  symmetry    EKG:  EKG is NOT ordered today. Reviewed ekg post stent.   Recent Labs: 11/11/2014: ALT 26 07/29/2015: BUN 17; Creatinine, Ser 1.05; Hemoglobin 14.1; Platelets 196; Potassium 4.1; Sodium 138    Lipid Panel    Component Value Date/Time   CHOL 156 01/13/2015 0926   TRIG 193.0* 01/13/2015 0926   HDL 37.10* 01/13/2015 0926   CHOLHDL 4 01/13/2015 0926   VLDL 38.6 01/13/2015 0926   LDLCALC 80 01/13/2015 0926   LDLDIRECT 155.0 11/11/2014 0849       Other studies Reviewed: Additional studies/ records that were reviewed today include:   Cardiac cath:Marland Kitchen Dominance: Right   Left Main  No significant disease.     Left Anterior Descending  95% ostial LAD stenosis proximal to D1. Moderate D1 with luminal irregularities. 40% mid LAD stenosis. 40% distal LAD stenosis.     Left Circumflex  Patent proximal LCx stent. High OM1 with small to moderate 60% mid vessel stenosis. Small OM2 with subtotal occlusion at the ostium. 50% proximal PLOM.     Right Coronary Artery  Moderate 2nd acute marginal with 70% ostial stenosis. Luminal irregularities in the RCA itself.        Successful percutaneous coronary intervention of the ostial proximal LAD treated with Angiosculpt scoring balloon and DES stenting with insertion of a 3.033 mm Xience Alpine DES stent postdilated with stent taper from 3.5 mm at the ostium to 3.4 mm distally with the stenoses being reduced to 0%. Xience  Alpine  RECOMMENDATION: The patient will continue with dual antiplatelet therapy, probably indefinitely. His previously placed circumflex stent is widely patent. Aggressive medical therapy and lipid lowering therapy will be continued to hopefully induce plaque regression for concomitant disease.    ASSESSMENT AND PLAN:  1.  Previous angina, with + nuc study now s/p stent to LAD for 95% stenosis, EF normal. On ASA and Brilinta.  Taking his BB statin as instructed. Pt does not want rehab, but willing to do 1 day a week. Will discuss with Dr. Aundra Dubin. He will follow up with Dr. Aundra Dubin in Dec.   2. CAD new stent and previous stent to LCX.  No chest pain or SOB today.  3. Hyperlipidemia just had lipomed panel drawn.  On Repatha  4. Carotid bruit and stenosis for repeat dopplers in Jan. 2017  5. Hx cardiomyopathy but Ef WNL now.  Continue meds.   BP up some this AM but he has not taken his meds.   He will take when he arrives home.    Current medicines are reviewed with the patient today.  The patient Has no concerns regarding medicines.  The following changes have been made:  See above Labs/ tests ordered today include:see above  Disposition:   FU:  see above  Signed, Isaiah Serge, NP  08/11/2015 9:07 AM    [facility] [address] [place] [place], [place] Phone: [phone]; Fax: [phone]

## 2015-08-12 ENCOUNTER — Telehealth: Payer: Self-pay

## 2015-08-12 NOTE — Telephone Encounter (Signed)
Called patient about about Dr. Claris Gladden response. Patient verbalized understanding.

## 2015-08-12 NOTE — Telephone Encounter (Signed)
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 Aundra Dubin.         ----- Message -----     From: [name], MD     Sent: 08/11/2015  8:46 PM      To: Isaiah Serge, NP        I think that he can just work out on his own, he goes to the gym.     ----- Message -----     From: Isaiah Serge, NP     Sent: 08/11/2015  9:21 AM      To: [name], MD        I saw Mr. Willadsen is not really interested in cardiac rehab, he is active and does not want to go 3 days a week, he would agree to 1 day, he hasked me to review with you. Otherwise no problems. He is going hunting soon. Thanks.     Left message for patient to call back about note above.

## 2015-08-15 DIAGNOSIS — E291 Testicular hypofunction: Secondary | ICD-10-CM | POA: Diagnosis not present

## 2015-08-15 DIAGNOSIS — R21 Rash and other nonspecific skin eruption: Secondary | ICD-10-CM | POA: Diagnosis not present

## 2015-08-15 DIAGNOSIS — Z6829 Body mass index (BMI) 29.0-29.9, adult: Secondary | ICD-10-CM | POA: Diagnosis not present

## 2015-08-15 LAB — CARDIO IQ(R) ADVANCED LIPID PANEL
Apolipoprotein B: 77 mg/dL (ref 52–109)
CHOLESTEROL, TOTAL (CARDIO IQ ADV LIPID PANEL): 147 mg/dL (ref 125–200)
CHOLESTEROL/HDL RATIO (CARDIO IQ ADV LIPID PANEL): 4.5 calc (ref <=5.0)
HDL Cholesterol: 33 mg/dL — ABNORMAL LOW (ref >=40)
LDL Large: 4483 nmol/L (ref 4334–10815)
LDL MEDIUM: 175 nmol/L (ref 167–465)
LDL PEAK SIZE: 208.7 Angstrom — AB (ref >=218.2)
LDL Particle Number: 1283 nmol/L (ref 1016–2185)
LDL SMALL: 252 nmol/L (ref 123–441)
LDL, Calculated: 77 mg/dL
NON-HDL CHOLESTEROL (CARDIO IQ ADV LIPID PANEL): 114 mg/dL
Triglycerides: 186 mg/dL — ABNORMAL HIGH

## 2015-08-16 ENCOUNTER — Ambulatory Visit: Payer: Medicare Other | Admitting: Nurse Practitioner

## 2015-08-17 ENCOUNTER — Ambulatory Visit: Payer: Medicare Other | Admitting: Nurse Practitioner

## 2015-08-18 DIAGNOSIS — Z85828 Personal history of other malignant neoplasm of skin: Secondary | ICD-10-CM | POA: Diagnosis not present

## 2015-08-18 DIAGNOSIS — L300 Nummular dermatitis: Secondary | ICD-10-CM | POA: Diagnosis not present

## 2015-08-18 DIAGNOSIS — L308 Other specified dermatitis: Secondary | ICD-10-CM | POA: Diagnosis not present

## 2015-08-22 DIAGNOSIS — J301 Allergic rhinitis due to pollen: Secondary | ICD-10-CM | POA: Diagnosis not present

## 2015-08-22 DIAGNOSIS — J3089 Other allergic rhinitis: Secondary | ICD-10-CM | POA: Diagnosis not present

## 2015-08-22 DIAGNOSIS — J3081 Allergic rhinitis due to animal (cat) (dog) hair and dander: Secondary | ICD-10-CM | POA: Diagnosis not present

## 2015-08-25 ENCOUNTER — Other Ambulatory Visit: Payer: Self-pay | Admitting: Cardiology

## 2015-08-28 ENCOUNTER — Other Ambulatory Visit: Payer: Self-pay | Admitting: Cardiology

## 2015-08-28 DIAGNOSIS — Z955 Presence of coronary angioplasty implant and graft: Secondary | ICD-10-CM

## 2015-08-28 HISTORY — DX: Presence of coronary angioplasty implant and graft: Z95.5

## 2015-09-01 DIAGNOSIS — J301 Allergic rhinitis due to pollen: Secondary | ICD-10-CM | POA: Diagnosis not present

## 2015-09-01 DIAGNOSIS — J3081 Allergic rhinitis due to animal (cat) (dog) hair and dander: Secondary | ICD-10-CM | POA: Diagnosis not present

## 2015-09-01 DIAGNOSIS — J3089 Other allergic rhinitis: Secondary | ICD-10-CM | POA: Diagnosis not present

## 2015-09-07 DIAGNOSIS — R21 Rash and other nonspecific skin eruption: Secondary | ICD-10-CM | POA: Diagnosis not present

## 2015-09-07 DIAGNOSIS — E230 Hypopituitarism: Secondary | ICD-10-CM | POA: Diagnosis not present

## 2015-09-07 DIAGNOSIS — E784 Other hyperlipidemia: Secondary | ICD-10-CM | POA: Diagnosis not present

## 2015-09-07 DIAGNOSIS — Z6830 Body mass index (BMI) 30.0-30.9, adult: Secondary | ICD-10-CM | POA: Diagnosis not present

## 2015-09-07 DIAGNOSIS — R7301 Impaired fasting glucose: Secondary | ICD-10-CM | POA: Diagnosis not present

## 2015-09-07 DIAGNOSIS — E291 Testicular hypofunction: Secondary | ICD-10-CM | POA: Diagnosis not present

## 2015-09-07 DIAGNOSIS — I2510 Atherosclerotic heart disease of native coronary artery without angina pectoris: Secondary | ICD-10-CM | POA: Diagnosis not present

## 2015-09-07 DIAGNOSIS — Z23 Encounter for immunization: Secondary | ICD-10-CM | POA: Diagnosis not present

## 2015-09-09 DIAGNOSIS — J3089 Other allergic rhinitis: Secondary | ICD-10-CM | POA: Diagnosis not present

## 2015-09-09 DIAGNOSIS — J3081 Allergic rhinitis due to animal (cat) (dog) hair and dander: Secondary | ICD-10-CM | POA: Diagnosis not present

## 2015-09-09 DIAGNOSIS — J301 Allergic rhinitis due to pollen: Secondary | ICD-10-CM | POA: Diagnosis not present

## 2015-09-19 ENCOUNTER — Encounter: Payer: Self-pay | Admitting: Cardiology

## 2015-09-19 ENCOUNTER — Encounter: Payer: Self-pay | Admitting: *Deleted

## 2015-09-19 ENCOUNTER — Ambulatory Visit: Payer: Medicare Other | Admitting: Cardiology

## 2015-09-19 ENCOUNTER — Ambulatory Visit (INDEPENDENT_AMBULATORY_CARE_PROVIDER_SITE_OTHER): Payer: Medicare Other | Admitting: Cardiology

## 2015-09-19 VITALS — BP 138/66 | HR 64 | Ht 75.0 in | Wt 239.8 lb

## 2015-09-19 DIAGNOSIS — I6521 Occlusion and stenosis of right carotid artery: Secondary | ICD-10-CM

## 2015-09-19 DIAGNOSIS — I2510 Atherosclerotic heart disease of native coronary artery without angina pectoris: Secondary | ICD-10-CM | POA: Diagnosis not present

## 2015-09-19 DIAGNOSIS — I208 Other forms of angina pectoris: Secondary | ICD-10-CM

## 2015-09-19 DIAGNOSIS — I491 Atrial premature depolarization: Secondary | ICD-10-CM | POA: Diagnosis not present

## 2015-09-19 NOTE — Patient Instructions (Signed)
Medication Instructions:  No changes today  Labwork: None today  Testing/Procedures: Your physician has requested that you have a carotid duplex. This test is an ultrasound of the carotid arteries in your neck. It looks at blood flow through these arteries that supply the brain with blood. Allow one hour for this exam. There are no restrictions or special instructions. January 2017    Follow-Up: Your physician wants you to follow-up in: 6 months with Dr Aundra Dubin. (June 2017),. You will receive a reminder letter in the mail two months in advance. If you don't receive a letter, please call our office to schedule the follow-up appointment.        If you need a refill on your cardiac medications before your next appointment, please call your pharmacy.

## 2015-09-20 DIAGNOSIS — J301 Allergic rhinitis due to pollen: Secondary | ICD-10-CM | POA: Diagnosis not present

## 2015-09-20 DIAGNOSIS — J3089 Other allergic rhinitis: Secondary | ICD-10-CM | POA: Diagnosis not present

## 2015-09-20 DIAGNOSIS — J3081 Allergic rhinitis due to animal (cat) (dog) hair and dander: Secondary | ICD-10-CM | POA: Diagnosis not present

## 2015-09-20 NOTE — Progress Notes (Signed)
Patient ID: [name], [name], male   DOB: August 16, 1934, 81 y.o.   MRN: [id number] PCP: [name]  81 yo with history of pituitary adenoma s/p resection, nonobstructive CAD, nonischemic cardiomyopathy, and chronic atrial bigeminy presents for cardiology followup.  Patient has a long history of atrial bigeminy.  He is in this today.  He had had a nonischemic cardiomyopathy with EF around 40% but most recent echo showed normal EF.  Historically, he has not been able to tolerate statins, and he has been tried on multiple agents. He is now on Repatha.   In 11/15, he developed chest tightness and was admitted to [facility] with NSTEMI.  LHC showed 60-70% proximal LAD stenosis with totally occluded LCx.  This was opened with DES to the proximal LCx.  Echo post-cath showed EF 55-60%.  In 10/16, he developed typical angina.  LHC showed 95% proximal LAD stenosis that was treated with DES.    He can only tolerate Coreg 3.125 mg 1/2 tab twice a day.  He has had difficulty with BP-active meds in the setting of adrenal insufficiency post pituitary surgery.   Currently doing well.  No exertional chest pain or dyspnea.  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  He tried taking Crestor two times/week but developed myalgias and stopped it.   ECG: NSR, normal  Labs (5/12): K 4.3, creatinine 1.03 Labs (2/15): K 4, creatinine 1.1, LDL 184 Labs (11/15): K 4.8, creatinine 1.07 Labs (3/16): K 4.4, creatinine 0.9 Labs (4/16): LDL 80, HDL 37 (on Repatha) Labs (5/16): HCT 42.7 Labs (10/16): creatinine 1.05 Labs (11/16): LDL 77, LDL-P 1283  PMH: 1. Hyperlipidemia: He has been unable to tolerate statins.  2. Chronic atrial bigeminy: x years.  3. Hypothyroidism: s/p surgery for pituitary adenoma.  4. Pituitary adenoma: s/p surgery in 3/11 for removal.  Now on thyroid replacement and hydrocortisone.  5. Cardiomyopathy: Nonischemic.  LHC (2009) with 50% mid LAD, 60% distal LAD.  Left ventriculogram (2009) with EF 40-45%.   Echo (8/13) with EF 35-40%, mild LVH.  Echo (11/14) with EF 40%, diffuse hypokinesis, normal RV.  He was tried in the past on an ACEI but apparently at that time began to have side effects from his pituitary adenoma that were thought initially to be ACEI-related.   He has been off ACEI since that time.  He has had trouble tolerating more that 1/2 tab of 3.125 mg Coreg once a day. Echo (11/15) with EF 55-60%, aortic sclerosis without significant stenosis.  6. CAD: 2009 LHC with 50% mid, 60% distal LAD. NSTEMI 11/15 with 60-70% pLAD, totally occluded LCx with DES to proximal LCx.  EF 55-60% by LV-gram.  Angina with LHC 10/16 showing 95% pLAD, 40% mLAD, 40% dLAD, 70% AM2 => treated with DES to pLAD.  7. Mild cognitive impairment.  8. Carotid stenosis: Carotid dopplers (1/16) with 40-59% RICA stenosis.   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   FH: Brother with CAD.  ROS: All systems reviewed and negative except as per HPI.   Current Outpatient Prescriptions  Medication Sig Dispense Refill  . ALPRAZolam (XANAX) 0.5 MG tablet Take 0.5 mg by mouth as needed for anxiety.     Marland Kitchen aspirin 81 MG tablet Take 81 mg by mouth daily.      Marland Kitchen BRILINTA 90 MG TABS tablet take 1 tablet by mouth twice a day 60 tablet 10  . carvedilol (COREG) 3.125 MG tablet Take 0.5 tablets (1.5625 mg  total) by mouth at bedtime. 30 tablet 6  . Coenzyme Q10 (COQ10) 200 MG CAPS Take 1 capsule by mouth daily.      . diazepam (VALIUM) 5 MG tablet Take 5 mg by mouth as needed (for MRI).     Marland Kitchen EPIPEN 2-PAK 0.3 MG/0.3ML SOAJ injection Inject 1 Dose as directed as needed.  0  . ergocalciferol (VITAMIN D2) 50000 UNITS capsule Take 50,000 Units by mouth 2 (two) times a week.     . Evolocumab (REPATHA) 140 MG/ML SOSY Inject into the skin every 14 (fourteen) days.    . finasteride (PROSCAR) 5 MG tablet Take 5 mg by mouth daily.      . hydrocortisone (CORTEF) 10 MG tablet Take 15 mg by mouth daily. One and on half  tablet daily    . levothyroxine (SYNTHROID, LEVOTHROID) 150 MCG tablet Take 150 mcg by mouth daily before breakfast.     . nitroGLYCERIN (NITROSTAT) 0.4 MG SL tablet Place 1 tablet (0.4 mg total) under the tongue every 5 (five) minutes as needed for chest pain. 90 tablet 3  . Nystatin (NYAMYC) 100000 UNIT/GM POWD Apply 1 g topically daily as needed (yeast infection).   10  . Propylene Glycol (SYSTANE BALANCE OP) Apply 2 drops to eye daily as needed (dry eye).    . Red Yeast Rice Extract 600 MG CAPS Take 600 mg by mouth daily.    . tadalafil (CIALIS) 5 MG tablet Take 1 tablet (5 mg total) by mouth daily as needed for erectile dysfunction. Do not take nitro within 48 hrs after taking cialis 10 tablet 1  . testosterone cypionate (DEPOTESTOTERONE CYPIONATE) 200 MG/ML injection Inject into the muscle every 21 ( twenty-one) days. 1.62 % injection    . VESICARE 10 MG tablet Take 5 mg by mouth daily.   10   No current facility-administered medications for this visit.    BP 138/66 mmHg  Pulse 64  Ht 6\' 3"  (1.905 m)  Wt 239 lb 12.8 oz (108.773 kg)  BMI 29.97 kg/m2 General: NAD Neck: No JVD, no thyromegaly or thyroid nodule.  Lungs: Clear to auscultation bilaterally with normal respiratory effort. CV: Nondisplaced PMI.  Heart regular S1/S2, no S3/S4, no murmur.  1+ ankle edema.  Right carotid bruit.  Normal pedal pulses.  Abdomen: Soft, nontender, no hepatosplenomegaly, no distention.  Skin: Intact without lesions or rashes.  Neurologic: Alert and oriented x 3.  Psych: Normal affect. Extremities: No clubbing or cyanosis.   Assessment/Plan: 1. Cardiomyopathy: EF 40% with diffuse hypokinesis on 11/14 echo, but most recent echo showed EF up to 55-60%.  He is euvolemic with NYHA class I symptoms.  Continue Coreg, cannot tolerate uptitration.   2. CAD: s/p NSTEMI with DES to LCx then angina with DES to pLAD.  He has not tolerated multiple statins but is now on Repatha.  No ischemic symptoms.  - Will  continue ASA 81 and Brilinta 90 mg bid.  In 10/17, I will probably decrease his Brilinta to 60 mg bid.  - Continue Coreg to 3.125 mg 1/2 tab bid  - Continue Repatha. 3. Hyperlipidemia: He has not tolerated multiple statins and apparently not Zetia either.  Most recently, he failed Crestor 5 mg two times/week due to myalgias.  LDL much improved on Repatha though LDL particle number is still above goal. 4. Carotid bruit: On right.  40-59% RICA stenosis on carotid dopplers.  Repeat in 1/17.  5. Atrial bigeminy: Chronic.  EF is normal on echo.  He does not tolerate higher doses of beta blockers.   Followup in 6 months.    [name] 09/20/2015

## 2015-09-27 DIAGNOSIS — J3089 Other allergic rhinitis: Secondary | ICD-10-CM | POA: Diagnosis not present

## 2015-09-27 DIAGNOSIS — J3081 Allergic rhinitis due to animal (cat) (dog) hair and dander: Secondary | ICD-10-CM | POA: Diagnosis not present

## 2015-09-27 DIAGNOSIS — J301 Allergic rhinitis due to pollen: Secondary | ICD-10-CM | POA: Diagnosis not present

## 2015-09-28 DIAGNOSIS — E291 Testicular hypofunction: Secondary | ICD-10-CM | POA: Diagnosis not present

## 2015-10-05 DIAGNOSIS — J3089 Other allergic rhinitis: Secondary | ICD-10-CM | POA: Diagnosis not present

## 2015-10-05 DIAGNOSIS — J3081 Allergic rhinitis due to animal (cat) (dog) hair and dander: Secondary | ICD-10-CM | POA: Diagnosis not present

## 2015-10-05 DIAGNOSIS — J301 Allergic rhinitis due to pollen: Secondary | ICD-10-CM | POA: Diagnosis not present

## 2015-10-12 DIAGNOSIS — J3089 Other allergic rhinitis: Secondary | ICD-10-CM | POA: Diagnosis not present

## 2015-10-12 DIAGNOSIS — J301 Allergic rhinitis due to pollen: Secondary | ICD-10-CM | POA: Diagnosis not present

## 2015-10-12 DIAGNOSIS — J3081 Allergic rhinitis due to animal (cat) (dog) hair and dander: Secondary | ICD-10-CM | POA: Diagnosis not present

## 2015-10-19 ENCOUNTER — Ambulatory Visit (HOSPITAL_COMMUNITY)
Admission: RE | Admit: 2015-10-19 | Discharge: 2015-10-19 | Disposition: A | Discharge status: Home / Self Care | Payer: Medicare Other | Source: Ambulatory Visit | Attending: Cardiovascular Disease | Admitting: Cardiovascular Disease

## 2015-10-19 DIAGNOSIS — I6523 Occlusion and stenosis of bilateral carotid arteries: Secondary | ICD-10-CM | POA: Insufficient documentation

## 2015-10-19 DIAGNOSIS — E291 Testicular hypofunction: Secondary | ICD-10-CM | POA: Diagnosis not present

## 2015-10-19 DIAGNOSIS — E785 Hyperlipidemia, unspecified: Secondary | ICD-10-CM | POA: Diagnosis not present

## 2015-10-19 DIAGNOSIS — I6521 Occlusion and stenosis of right carotid artery: Secondary | ICD-10-CM | POA: Diagnosis not present

## 2015-10-19 DIAGNOSIS — I2510 Atherosclerotic heart disease of native coronary artery without angina pectoris: Secondary | ICD-10-CM

## 2015-10-24 DIAGNOSIS — J3081 Allergic rhinitis due to animal (cat) (dog) hair and dander: Secondary | ICD-10-CM | POA: Diagnosis not present

## 2015-10-24 DIAGNOSIS — J301 Allergic rhinitis due to pollen: Secondary | ICD-10-CM | POA: Diagnosis not present

## 2015-10-24 DIAGNOSIS — J3089 Other allergic rhinitis: Secondary | ICD-10-CM | POA: Diagnosis not present

## 2015-10-25 DIAGNOSIS — J301 Allergic rhinitis due to pollen: Secondary | ICD-10-CM | POA: Diagnosis not present

## 2015-10-25 DIAGNOSIS — J3089 Other allergic rhinitis: Secondary | ICD-10-CM | POA: Diagnosis not present

## 2015-10-25 DIAGNOSIS — J3081 Allergic rhinitis due to animal (cat) (dog) hair and dander: Secondary | ICD-10-CM | POA: Diagnosis not present

## 2015-11-02 DIAGNOSIS — J301 Allergic rhinitis due to pollen: Secondary | ICD-10-CM | POA: Diagnosis not present

## 2015-11-02 DIAGNOSIS — J3089 Other allergic rhinitis: Secondary | ICD-10-CM | POA: Diagnosis not present

## 2015-11-02 DIAGNOSIS — J3081 Allergic rhinitis due to animal (cat) (dog) hair and dander: Secondary | ICD-10-CM | POA: Diagnosis not present

## 2015-11-08 DIAGNOSIS — E298 Other testicular dysfunction: Secondary | ICD-10-CM | POA: Diagnosis not present

## 2015-11-09 DIAGNOSIS — J301 Allergic rhinitis due to pollen: Secondary | ICD-10-CM | POA: Diagnosis not present

## 2015-11-09 DIAGNOSIS — J3081 Allergic rhinitis due to animal (cat) (dog) hair and dander: Secondary | ICD-10-CM | POA: Diagnosis not present

## 2015-11-09 DIAGNOSIS — J3089 Other allergic rhinitis: Secondary | ICD-10-CM | POA: Diagnosis not present

## 2015-11-14 DIAGNOSIS — J301 Allergic rhinitis due to pollen: Secondary | ICD-10-CM | POA: Diagnosis not present

## 2015-11-14 DIAGNOSIS — J3081 Allergic rhinitis due to animal (cat) (dog) hair and dander: Secondary | ICD-10-CM | POA: Diagnosis not present

## 2015-11-14 DIAGNOSIS — J3089 Other allergic rhinitis: Secondary | ICD-10-CM | POA: Diagnosis not present

## 2015-11-17 DIAGNOSIS — J3089 Other allergic rhinitis: Secondary | ICD-10-CM | POA: Diagnosis not present

## 2015-11-17 DIAGNOSIS — J301 Allergic rhinitis due to pollen: Secondary | ICD-10-CM | POA: Diagnosis not present

## 2015-11-17 DIAGNOSIS — J3081 Allergic rhinitis due to animal (cat) (dog) hair and dander: Secondary | ICD-10-CM | POA: Diagnosis not present

## 2015-11-23 DIAGNOSIS — J3081 Allergic rhinitis due to animal (cat) (dog) hair and dander: Secondary | ICD-10-CM | POA: Diagnosis not present

## 2015-11-23 DIAGNOSIS — J3089 Other allergic rhinitis: Secondary | ICD-10-CM | POA: Diagnosis not present

## 2015-11-23 DIAGNOSIS — J301 Allergic rhinitis due to pollen: Secondary | ICD-10-CM | POA: Diagnosis not present

## 2015-11-30 DIAGNOSIS — J3089 Other allergic rhinitis: Secondary | ICD-10-CM | POA: Diagnosis not present

## 2015-11-30 DIAGNOSIS — J3081 Allergic rhinitis due to animal (cat) (dog) hair and dander: Secondary | ICD-10-CM | POA: Diagnosis not present

## 2015-11-30 DIAGNOSIS — J301 Allergic rhinitis due to pollen: Secondary | ICD-10-CM | POA: Diagnosis not present

## 2015-12-01 DIAGNOSIS — E298 Other testicular dysfunction: Secondary | ICD-10-CM | POA: Diagnosis not present

## 2015-12-09 DIAGNOSIS — J3081 Allergic rhinitis due to animal (cat) (dog) hair and dander: Secondary | ICD-10-CM | POA: Diagnosis not present

## 2015-12-09 DIAGNOSIS — J301 Allergic rhinitis due to pollen: Secondary | ICD-10-CM | POA: Diagnosis not present

## 2015-12-09 DIAGNOSIS — J3089 Other allergic rhinitis: Secondary | ICD-10-CM | POA: Diagnosis not present

## 2015-12-14 DIAGNOSIS — J301 Allergic rhinitis due to pollen: Secondary | ICD-10-CM | POA: Diagnosis not present

## 2015-12-14 DIAGNOSIS — J3089 Other allergic rhinitis: Secondary | ICD-10-CM | POA: Diagnosis not present

## 2015-12-14 DIAGNOSIS — J3081 Allergic rhinitis due to animal (cat) (dog) hair and dander: Secondary | ICD-10-CM | POA: Diagnosis not present

## 2015-12-20 DIAGNOSIS — J3089 Other allergic rhinitis: Secondary | ICD-10-CM | POA: Diagnosis not present

## 2015-12-20 DIAGNOSIS — J301 Allergic rhinitis due to pollen: Secondary | ICD-10-CM | POA: Diagnosis not present

## 2015-12-20 DIAGNOSIS — J3081 Allergic rhinitis due to animal (cat) (dog) hair and dander: Secondary | ICD-10-CM | POA: Diagnosis not present

## 2015-12-21 DIAGNOSIS — E291 Testicular hypofunction: Secondary | ICD-10-CM | POA: Diagnosis not present

## 2015-12-29 DIAGNOSIS — J3089 Other allergic rhinitis: Secondary | ICD-10-CM | POA: Diagnosis not present

## 2015-12-29 DIAGNOSIS — J3081 Allergic rhinitis due to animal (cat) (dog) hair and dander: Secondary | ICD-10-CM | POA: Diagnosis not present

## 2015-12-29 DIAGNOSIS — J301 Allergic rhinitis due to pollen: Secondary | ICD-10-CM | POA: Diagnosis not present

## 2016-01-04 DIAGNOSIS — J3089 Other allergic rhinitis: Secondary | ICD-10-CM | POA: Diagnosis not present

## 2016-01-04 DIAGNOSIS — J301 Allergic rhinitis due to pollen: Secondary | ICD-10-CM | POA: Diagnosis not present

## 2016-01-04 DIAGNOSIS — J3081 Allergic rhinitis due to animal (cat) (dog) hair and dander: Secondary | ICD-10-CM | POA: Diagnosis not present

## 2016-01-10 DIAGNOSIS — E298 Other testicular dysfunction: Secondary | ICD-10-CM | POA: Diagnosis not present

## 2016-01-25 DIAGNOSIS — L821 Other seborrheic keratosis: Secondary | ICD-10-CM | POA: Diagnosis not present

## 2016-01-25 DIAGNOSIS — N5201 Erectile dysfunction due to arterial insufficiency: Secondary | ICD-10-CM | POA: Diagnosis not present

## 2016-01-25 DIAGNOSIS — R972 Elevated prostate specific antigen [PSA]: Secondary | ICD-10-CM | POA: Diagnosis not present

## 2016-01-25 DIAGNOSIS — N401 Enlarged prostate with lower urinary tract symptoms: Secondary | ICD-10-CM | POA: Diagnosis not present

## 2016-01-25 DIAGNOSIS — L565 Disseminated superficial actinic porokeratosis (DSAP): Secondary | ICD-10-CM | POA: Diagnosis not present

## 2016-01-25 DIAGNOSIS — Z85820 Personal history of malignant melanoma of skin: Secondary | ICD-10-CM | POA: Diagnosis not present

## 2016-01-25 DIAGNOSIS — Z85828 Personal history of other malignant neoplasm of skin: Secondary | ICD-10-CM | POA: Diagnosis not present

## 2016-01-25 DIAGNOSIS — R350 Frequency of micturition: Secondary | ICD-10-CM | POA: Diagnosis not present

## 2016-01-25 DIAGNOSIS — Z0000 Encounter for general adult medical examination without abnormal findings: Secondary | ICD-10-CM | POA: Diagnosis not present

## 2016-01-25 DIAGNOSIS — L570 Actinic keratosis: Secondary | ICD-10-CM | POA: Diagnosis not present

## 2016-01-27 DIAGNOSIS — J3089 Other allergic rhinitis: Secondary | ICD-10-CM | POA: Diagnosis not present

## 2016-01-27 DIAGNOSIS — J3081 Allergic rhinitis due to animal (cat) (dog) hair and dander: Secondary | ICD-10-CM | POA: Diagnosis not present

## 2016-01-27 DIAGNOSIS — J301 Allergic rhinitis due to pollen: Secondary | ICD-10-CM | POA: Diagnosis not present

## 2016-01-31 DIAGNOSIS — E298 Other testicular dysfunction: Secondary | ICD-10-CM | POA: Diagnosis not present

## 2016-02-10 DIAGNOSIS — E038 Other specified hypothyroidism: Secondary | ICD-10-CM | POA: Diagnosis not present

## 2016-02-10 DIAGNOSIS — R7301 Impaired fasting glucose: Secondary | ICD-10-CM | POA: Diagnosis not present

## 2016-02-10 DIAGNOSIS — E784 Other hyperlipidemia: Secondary | ICD-10-CM | POA: Diagnosis not present

## 2016-02-10 DIAGNOSIS — E559 Vitamin D deficiency, unspecified: Secondary | ICD-10-CM | POA: Diagnosis not present

## 2016-02-10 DIAGNOSIS — Z125 Encounter for screening for malignant neoplasm of prostate: Secondary | ICD-10-CM | POA: Diagnosis not present

## 2016-02-13 DIAGNOSIS — N401 Enlarged prostate with lower urinary tract symptoms: Secondary | ICD-10-CM | POA: Diagnosis not present

## 2016-02-13 DIAGNOSIS — J3081 Allergic rhinitis due to animal (cat) (dog) hair and dander: Secondary | ICD-10-CM | POA: Diagnosis not present

## 2016-02-13 DIAGNOSIS — Z1389 Encounter for screening for other disorder: Secondary | ICD-10-CM | POA: Diagnosis not present

## 2016-02-13 DIAGNOSIS — E7800 Pure hypercholesterolemia, unspecified: Secondary | ICD-10-CM | POA: Diagnosis not present

## 2016-02-13 DIAGNOSIS — I2510 Atherosclerotic heart disease of native coronary artery without angina pectoris: Secondary | ICD-10-CM | POA: Diagnosis not present

## 2016-02-13 DIAGNOSIS — Z0000 Encounter for general adult medical examination without abnormal findings: Secondary | ICD-10-CM | POA: Diagnosis not present

## 2016-02-13 DIAGNOSIS — F4321 Adjustment disorder with depressed mood: Secondary | ICD-10-CM | POA: Diagnosis not present

## 2016-02-13 DIAGNOSIS — K5909 Other constipation: Secondary | ICD-10-CM | POA: Diagnosis not present

## 2016-02-13 DIAGNOSIS — Z6830 Body mass index (BMI) 30.0-30.9, adult: Secondary | ICD-10-CM | POA: Diagnosis not present

## 2016-02-13 DIAGNOSIS — J301 Allergic rhinitis due to pollen: Secondary | ICD-10-CM | POA: Diagnosis not present

## 2016-02-13 DIAGNOSIS — R413 Other amnesia: Secondary | ICD-10-CM | POA: Diagnosis not present

## 2016-02-13 DIAGNOSIS — J3089 Other allergic rhinitis: Secondary | ICD-10-CM | POA: Diagnosis not present

## 2016-02-13 DIAGNOSIS — E230 Hypopituitarism: Secondary | ICD-10-CM | POA: Diagnosis not present

## 2016-02-13 DIAGNOSIS — R7301 Impaired fasting glucose: Secondary | ICD-10-CM | POA: Diagnosis not present

## 2016-02-13 DIAGNOSIS — N528 Other male erectile dysfunction: Secondary | ICD-10-CM | POA: Diagnosis not present

## 2016-02-13 DIAGNOSIS — D126 Benign neoplasm of colon, unspecified: Secondary | ICD-10-CM | POA: Diagnosis not present

## 2016-02-21 DIAGNOSIS — E298 Other testicular dysfunction: Secondary | ICD-10-CM | POA: Diagnosis not present

## 2016-02-29 DIAGNOSIS — J3081 Allergic rhinitis due to animal (cat) (dog) hair and dander: Secondary | ICD-10-CM | POA: Diagnosis not present

## 2016-02-29 DIAGNOSIS — J301 Allergic rhinitis due to pollen: Secondary | ICD-10-CM | POA: Diagnosis not present

## 2016-02-29 DIAGNOSIS — J3089 Other allergic rhinitis: Secondary | ICD-10-CM | POA: Diagnosis not present

## 2016-03-07 DIAGNOSIS — M545 Low back pain: Secondary | ICD-10-CM | POA: Diagnosis not present

## 2016-03-07 DIAGNOSIS — M9904 Segmental and somatic dysfunction of sacral region: Secondary | ICD-10-CM | POA: Diagnosis not present

## 2016-03-07 DIAGNOSIS — M9903 Segmental and somatic dysfunction of lumbar region: Secondary | ICD-10-CM | POA: Diagnosis not present

## 2016-03-07 DIAGNOSIS — M5431 Sciatica, right side: Secondary | ICD-10-CM | POA: Diagnosis not present

## 2016-03-08 DIAGNOSIS — M9904 Segmental and somatic dysfunction of sacral region: Secondary | ICD-10-CM | POA: Diagnosis not present

## 2016-03-08 DIAGNOSIS — M9903 Segmental and somatic dysfunction of lumbar region: Secondary | ICD-10-CM | POA: Diagnosis not present

## 2016-03-08 DIAGNOSIS — M545 Low back pain: Secondary | ICD-10-CM | POA: Diagnosis not present

## 2016-03-08 DIAGNOSIS — M5431 Sciatica, right side: Secondary | ICD-10-CM | POA: Diagnosis not present

## 2016-03-12 DIAGNOSIS — M5431 Sciatica, right side: Secondary | ICD-10-CM | POA: Diagnosis not present

## 2016-03-12 DIAGNOSIS — M545 Low back pain: Secondary | ICD-10-CM | POA: Diagnosis not present

## 2016-03-12 DIAGNOSIS — M9904 Segmental and somatic dysfunction of sacral region: Secondary | ICD-10-CM | POA: Diagnosis not present

## 2016-03-12 DIAGNOSIS — M9903 Segmental and somatic dysfunction of lumbar region: Secondary | ICD-10-CM | POA: Diagnosis not present

## 2016-03-14 DIAGNOSIS — M9904 Segmental and somatic dysfunction of sacral region: Secondary | ICD-10-CM | POA: Diagnosis not present

## 2016-03-14 DIAGNOSIS — J3089 Other allergic rhinitis: Secondary | ICD-10-CM | POA: Diagnosis not present

## 2016-03-14 DIAGNOSIS — E298 Other testicular dysfunction: Secondary | ICD-10-CM | POA: Diagnosis not present

## 2016-03-14 DIAGNOSIS — M9903 Segmental and somatic dysfunction of lumbar region: Secondary | ICD-10-CM | POA: Diagnosis not present

## 2016-03-14 DIAGNOSIS — M5431 Sciatica, right side: Secondary | ICD-10-CM | POA: Diagnosis not present

## 2016-03-14 DIAGNOSIS — M545 Low back pain: Secondary | ICD-10-CM | POA: Diagnosis not present

## 2016-03-14 DIAGNOSIS — J3081 Allergic rhinitis due to animal (cat) (dog) hair and dander: Secondary | ICD-10-CM | POA: Diagnosis not present

## 2016-03-14 DIAGNOSIS — J301 Allergic rhinitis due to pollen: Secondary | ICD-10-CM | POA: Diagnosis not present

## 2016-03-15 DIAGNOSIS — M9903 Segmental and somatic dysfunction of lumbar region: Secondary | ICD-10-CM | POA: Diagnosis not present

## 2016-03-15 DIAGNOSIS — M5431 Sciatica, right side: Secondary | ICD-10-CM | POA: Diagnosis not present

## 2016-03-15 DIAGNOSIS — M9904 Segmental and somatic dysfunction of sacral region: Secondary | ICD-10-CM | POA: Diagnosis not present

## 2016-03-15 DIAGNOSIS — M545 Low back pain: Secondary | ICD-10-CM | POA: Diagnosis not present

## 2016-03-19 DIAGNOSIS — M9904 Segmental and somatic dysfunction of sacral region: Secondary | ICD-10-CM | POA: Diagnosis not present

## 2016-03-19 DIAGNOSIS — M9903 Segmental and somatic dysfunction of lumbar region: Secondary | ICD-10-CM | POA: Diagnosis not present

## 2016-03-19 DIAGNOSIS — M545 Low back pain: Secondary | ICD-10-CM | POA: Diagnosis not present

## 2016-03-19 DIAGNOSIS — M5431 Sciatica, right side: Secondary | ICD-10-CM | POA: Diagnosis not present

## 2016-03-22 DIAGNOSIS — M9903 Segmental and somatic dysfunction of lumbar region: Secondary | ICD-10-CM | POA: Diagnosis not present

## 2016-03-22 DIAGNOSIS — M545 Low back pain: Secondary | ICD-10-CM | POA: Diagnosis not present

## 2016-03-22 DIAGNOSIS — M5431 Sciatica, right side: Secondary | ICD-10-CM | POA: Diagnosis not present

## 2016-03-22 DIAGNOSIS — M9904 Segmental and somatic dysfunction of sacral region: Secondary | ICD-10-CM | POA: Diagnosis not present

## 2016-03-26 DIAGNOSIS — M5431 Sciatica, right side: Secondary | ICD-10-CM | POA: Diagnosis not present

## 2016-03-26 DIAGNOSIS — M9903 Segmental and somatic dysfunction of lumbar region: Secondary | ICD-10-CM | POA: Diagnosis not present

## 2016-03-26 DIAGNOSIS — M9904 Segmental and somatic dysfunction of sacral region: Secondary | ICD-10-CM | POA: Diagnosis not present

## 2016-03-26 DIAGNOSIS — M545 Low back pain: Secondary | ICD-10-CM | POA: Diagnosis not present

## 2016-03-29 DIAGNOSIS — M9904 Segmental and somatic dysfunction of sacral region: Secondary | ICD-10-CM | POA: Diagnosis not present

## 2016-03-29 DIAGNOSIS — M9903 Segmental and somatic dysfunction of lumbar region: Secondary | ICD-10-CM | POA: Diagnosis not present

## 2016-03-29 DIAGNOSIS — M5431 Sciatica, right side: Secondary | ICD-10-CM | POA: Diagnosis not present

## 2016-03-29 DIAGNOSIS — M545 Low back pain: Secondary | ICD-10-CM | POA: Diagnosis not present

## 2016-04-02 DIAGNOSIS — M9903 Segmental and somatic dysfunction of lumbar region: Secondary | ICD-10-CM | POA: Diagnosis not present

## 2016-04-02 DIAGNOSIS — M545 Low back pain: Secondary | ICD-10-CM | POA: Diagnosis not present

## 2016-04-02 DIAGNOSIS — M9904 Segmental and somatic dysfunction of sacral region: Secondary | ICD-10-CM | POA: Diagnosis not present

## 2016-04-02 DIAGNOSIS — M5431 Sciatica, right side: Secondary | ICD-10-CM | POA: Diagnosis not present

## 2016-04-05 DIAGNOSIS — J301 Allergic rhinitis due to pollen: Secondary | ICD-10-CM | POA: Diagnosis not present

## 2016-04-05 DIAGNOSIS — J3089 Other allergic rhinitis: Secondary | ICD-10-CM | POA: Diagnosis not present

## 2016-04-05 DIAGNOSIS — E298 Other testicular dysfunction: Secondary | ICD-10-CM | POA: Diagnosis not present

## 2016-04-05 DIAGNOSIS — J3081 Allergic rhinitis due to animal (cat) (dog) hair and dander: Secondary | ICD-10-CM | POA: Diagnosis not present

## 2016-04-09 DIAGNOSIS — M9904 Segmental and somatic dysfunction of sacral region: Secondary | ICD-10-CM | POA: Diagnosis not present

## 2016-04-09 DIAGNOSIS — M545 Low back pain: Secondary | ICD-10-CM | POA: Diagnosis not present

## 2016-04-09 DIAGNOSIS — M9903 Segmental and somatic dysfunction of lumbar region: Secondary | ICD-10-CM | POA: Diagnosis not present

## 2016-04-09 DIAGNOSIS — M5431 Sciatica, right side: Secondary | ICD-10-CM | POA: Diagnosis not present

## 2016-04-16 DIAGNOSIS — J3081 Allergic rhinitis due to animal (cat) (dog) hair and dander: Secondary | ICD-10-CM | POA: Diagnosis not present

## 2016-04-16 DIAGNOSIS — J3089 Other allergic rhinitis: Secondary | ICD-10-CM | POA: Diagnosis not present

## 2016-04-16 DIAGNOSIS — J301 Allergic rhinitis due to pollen: Secondary | ICD-10-CM | POA: Diagnosis not present

## 2016-04-18 ENCOUNTER — Ambulatory Visit: Payer: Medicare Other | Admitting: Cardiology

## 2016-04-23 DIAGNOSIS — Z79899 Other long term (current) drug therapy: Secondary | ICD-10-CM | POA: Diagnosis not present

## 2016-04-23 DIAGNOSIS — M545 Low back pain: Secondary | ICD-10-CM | POA: Diagnosis not present

## 2016-04-23 DIAGNOSIS — M9903 Segmental and somatic dysfunction of lumbar region: Secondary | ICD-10-CM | POA: Diagnosis not present

## 2016-04-23 DIAGNOSIS — Z13820 Encounter for screening for osteoporosis: Secondary | ICD-10-CM | POA: Diagnosis not present

## 2016-04-23 DIAGNOSIS — M9904 Segmental and somatic dysfunction of sacral region: Secondary | ICD-10-CM | POA: Diagnosis not present

## 2016-04-23 DIAGNOSIS — M5431 Sciatica, right side: Secondary | ICD-10-CM | POA: Diagnosis not present

## 2016-04-25 DIAGNOSIS — E298 Other testicular dysfunction: Secondary | ICD-10-CM | POA: Diagnosis not present

## 2016-04-30 DIAGNOSIS — J3089 Other allergic rhinitis: Secondary | ICD-10-CM | POA: Diagnosis not present

## 2016-04-30 DIAGNOSIS — J301 Allergic rhinitis due to pollen: Secondary | ICD-10-CM | POA: Diagnosis not present

## 2016-04-30 DIAGNOSIS — J3081 Allergic rhinitis due to animal (cat) (dog) hair and dander: Secondary | ICD-10-CM | POA: Diagnosis not present

## 2016-05-15 DIAGNOSIS — J3089 Other allergic rhinitis: Secondary | ICD-10-CM | POA: Diagnosis not present

## 2016-05-15 DIAGNOSIS — J3081 Allergic rhinitis due to animal (cat) (dog) hair and dander: Secondary | ICD-10-CM | POA: Diagnosis not present

## 2016-05-15 DIAGNOSIS — J301 Allergic rhinitis due to pollen: Secondary | ICD-10-CM | POA: Diagnosis not present

## 2016-05-16 DIAGNOSIS — E291 Testicular hypofunction: Secondary | ICD-10-CM | POA: Diagnosis not present

## 2016-05-29 DIAGNOSIS — J3081 Allergic rhinitis due to animal (cat) (dog) hair and dander: Secondary | ICD-10-CM | POA: Diagnosis not present

## 2016-05-29 DIAGNOSIS — J3089 Other allergic rhinitis: Secondary | ICD-10-CM | POA: Diagnosis not present

## 2016-05-29 DIAGNOSIS — J301 Allergic rhinitis due to pollen: Secondary | ICD-10-CM | POA: Diagnosis not present

## 2016-05-31 DIAGNOSIS — J3089 Other allergic rhinitis: Secondary | ICD-10-CM | POA: Diagnosis not present

## 2016-05-31 DIAGNOSIS — J3081 Allergic rhinitis due to animal (cat) (dog) hair and dander: Secondary | ICD-10-CM | POA: Diagnosis not present

## 2016-05-31 DIAGNOSIS — J301 Allergic rhinitis due to pollen: Secondary | ICD-10-CM | POA: Diagnosis not present

## 2016-06-05 DIAGNOSIS — E298 Other testicular dysfunction: Secondary | ICD-10-CM | POA: Diagnosis not present

## 2016-06-11 ENCOUNTER — Ambulatory Visit: Payer: Medicare Other | Admitting: Cardiology

## 2016-06-15 DIAGNOSIS — J3081 Allergic rhinitis due to animal (cat) (dog) hair and dander: Secondary | ICD-10-CM | POA: Diagnosis not present

## 2016-06-15 DIAGNOSIS — J3089 Other allergic rhinitis: Secondary | ICD-10-CM | POA: Diagnosis not present

## 2016-06-15 DIAGNOSIS — J301 Allergic rhinitis due to pollen: Secondary | ICD-10-CM | POA: Diagnosis not present

## 2016-06-18 DIAGNOSIS — J301 Allergic rhinitis due to pollen: Secondary | ICD-10-CM | POA: Diagnosis not present

## 2016-06-18 DIAGNOSIS — J3089 Other allergic rhinitis: Secondary | ICD-10-CM | POA: Diagnosis not present

## 2016-06-18 DIAGNOSIS — J3081 Allergic rhinitis due to animal (cat) (dog) hair and dander: Secondary | ICD-10-CM | POA: Diagnosis not present

## 2016-06-21 ENCOUNTER — Encounter: Payer: Self-pay | Admitting: Cardiology

## 2016-06-21 ENCOUNTER — Ambulatory Visit (INDEPENDENT_AMBULATORY_CARE_PROVIDER_SITE_OTHER): Payer: Medicare Other | Admitting: Cardiology

## 2016-06-21 ENCOUNTER — Encounter (INDEPENDENT_AMBULATORY_CARE_PROVIDER_SITE_OTHER): Payer: Self-pay

## 2016-06-21 VITALS — BP 120/60 | HR 71 | Ht 75.0 in

## 2016-06-21 DIAGNOSIS — I2510 Atherosclerotic heart disease of native coronary artery without angina pectoris: Secondary | ICD-10-CM

## 2016-06-21 DIAGNOSIS — I429 Cardiomyopathy, unspecified: Secondary | ICD-10-CM | POA: Diagnosis not present

## 2016-06-21 DIAGNOSIS — I6521 Occlusion and stenosis of right carotid artery: Secondary | ICD-10-CM

## 2016-06-21 DIAGNOSIS — E7800 Pure hypercholesterolemia, unspecified: Secondary | ICD-10-CM | POA: Diagnosis not present

## 2016-06-21 MED ORDER — TICAGRELOR 60 MG PO TABS: 60.0000 mg | ORAL_TABLET | Freq: Two times a day (BID) | ORAL | 3 refills | Status: DC

## 2016-06-21 NOTE — Patient Instructions (Addendum)
Medication Instructions:  When you run out of your current supply of Brilinta 90mg  decrease the dose to Brilinta 60mg  two times a day.   Labwork: Lipid profile today  Testing/Procedures: None   Follow-Up: Your physician wants you to follow-up in: 6 months with Dr Aundra Dubin in the Heart and Vascular Clinic at [facility]. (March 2018). You will receive a reminder letter in the mail two months in advance. If you don't receive a letter, please call our office to schedule the follow-up appointment.      If you need a refill on your cardiac medications before your next appointment, please call your pharmacy.

## 2016-06-22 LAB — LIPID PANEL
CHOL/HDL RATIO: 5 ratio (ref <=5.0)
Cholesterol: 166 mg/dL (ref 125–200)
HDL: 33 mg/dL — AB (ref >=40)
LDL Cholesterol: 72 mg/dL (ref <130)
Triglycerides: 304 mg/dL — ABNORMAL HIGH (ref <150)
VLDL: 61 mg/dL — AB (ref <30)

## 2016-06-23 NOTE — Progress Notes (Signed)
Patient ID: [name], [name], male   DOB: [birth year], 82 y.o.   MRN: [id number] PCP: [name]  82 yo with history of pituitary adenoma s/p resection, nonobstructive CAD, nonischemic cardiomyopathy, and chronic atrial bigeminy presents for cardiology followup.  Patient has a long history of atrial bigeminy.  He is in this today.  He had had a nonischemic cardiomyopathy with EF around 40% but most recent echo showed normal EF. Historically, he has not been able to tolerate statins, and he has been tried on multiple agents. He is now on Repatha.   In 11/15, he developed chest tightness and was admitted to [facility] with NSTEMI.  LHC showed 60-70% proximal LAD stenosis with totally occluded LCx.  This was opened with DES to the proximal LCx.  Echo post-cath showed EF 55-60%.  In 10/16, he developed typical angina.  LHC showed 95% proximal LAD stenosis that was treated with DES.    He can only tolerate Coreg 3.125 mg 1/2 tab twice a day.  He has had difficulty with BP-active meds in the setting of adrenal insufficiency post pituitary surgery.   Currently doing well.  No exertional chest pain or dyspnea.  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    ECG: NSR, bigeminal PACs  Labs (5/12): K 4.3, creatinine 1.03 Labs (2/15): K 4, creatinine 1.1, LDL 184 Labs (11/15): K 4.8, creatinine 1.07 Labs (3/16): K 4.4, creatinine 0.9 Labs (4/16): LDL 80, HDL 37 (on Repatha) Labs (5/16): HCT 42.7 Labs (10/16): creatinine 1.05 Labs (11/16): LDL 77, LDL-P 1283  PMH: 1. Hyperlipidemia: He has been unable to tolerate statins.  2. Chronic atrial bigeminy: x years.  3. Hypothyroidism: s/p surgery for pituitary adenoma.  4. Pituitary adenoma: s/p surgery in 3/11 for removal.  Now on thyroid replacement and hydrocortisone.  5. Cardiomyopathy: Nonischemic.  LHC (2009) with 50% mid LAD, 60% distal LAD.  Left ventriculogram (2009) with EF 40-45%.  Echo (8/13) with EF 35-40%, mild LVH.  Echo (11/14) with EF 40%,  diffuse hypokinesis, normal RV.  He was tried in the past on an ACEI but apparently at that time began to have side effects from his pituitary adenoma that were thought initially to be ACEI-related.   He has been off ACEI since that time.  He has had trouble tolerating more that 1/2 tab of 3.125 mg Coreg once a day. Echo (11/15) with EF 55-60%, aortic sclerosis without significant stenosis.  6. CAD: 2009 LHC with 50% mid, 60% distal LAD. NSTEMI 11/15 with 60-70% pLAD, totally occluded LCx with DES to proximal LCx.  EF 55-60% by LV-gram.  Angina with LHC 10/16 showing 95% pLAD, 40% mLAD, 40% dLAD, 70% AM2 => treated with DES to pLAD.  7. Mild cognitive impairment.  8. Carotid stenosis: Carotid dopplers (1/16) with 40-59% RICA stenosis.  - Carotid dopplers (1/17): Mild plaque only.   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   FH: Brother with CAD.  ROS: All systems reviewed and negative except as per HPI.   Current Outpatient Prescriptions  Medication Sig Dispense Refill  . ALPRAZolam (XANAX) 0.5 MG tablet Take 0.5 mg by mouth as needed for anxiety.     Marland Kitchen aspirin 81 MG tablet Take 81 mg by mouth daily.      . carvedilol (COREG) 3.125 MG tablet Take 0.5 tablets (1.5625 mg total) by mouth at bedtime. 30 tablet 6  . Coenzyme Q10 (COQ10) 200 MG CAPS Take 1 capsule by mouth daily.      Marland Kitchen  diazepam (VALIUM) 5 MG tablet Take 5 mg by mouth as needed (for MRI).     Marland Kitchen EPIPEN 2-PAK 0.3 MG/0.3ML SOAJ injection Inject 1 Dose as directed as needed.  0  . ergocalciferol (VITAMIN D2) 50000 UNITS capsule Take 50,000 Units by mouth 2 (two) times a week.     . Evolocumab (REPATHA) 140 MG/ML SOSY Inject into the skin every 14 (fourteen) days.    . finasteride (PROSCAR) 5 MG tablet Take 5 mg by mouth daily.      . hydrocortisone (CORTEF) 10 MG tablet Take 15 mg by mouth daily. One and on half tablet daily    . levothyroxine (SYNTHROID, LEVOTHROID) 150 MCG tablet Take 150 mcg by mouth  daily before breakfast.     . nitroGLYCERIN (NITROSTAT) 0.4 MG SL tablet Place 1 tablet (0.4 mg total) under the tongue every 5 (five) minutes as needed for chest pain. 90 tablet 3  . Nystatin (NYAMYC) 100000 UNIT/GM POWD Apply 1 g topically daily as needed (yeast infection).   10  . Propylene Glycol (SYSTANE BALANCE OP) Apply 2 drops to eye daily as needed (dry eye).    . Red Yeast Rice Extract 600 MG CAPS Take 600 mg by mouth daily.    . tadalafil (CIALIS) 5 MG tablet Take 1 tablet (5 mg total) by mouth daily as needed for erectile dysfunction. Do not take nitro within 48 hrs after taking cialis 10 tablet 1  . testosterone cypionate (DEPOTESTOTERONE CYPIONATE) 200 MG/ML injection Inject into the muscle every 21 ( twenty-one) days. 1.62 % injection    . VESICARE 10 MG tablet Take 5 mg by mouth daily.   10  . ticagrelor (BRILINTA) 60 MG TABS tablet Take 1 tablet (60 mg total) by mouth 2 (two) times daily. 180 tablet 3   No current facility-administered medications for this visit.     BP 120/60   Pulse 71   Ht 6\' 3"  (1.905 m)  General: NAD Neck: No JVD, no thyromegaly or thyroid nodule.  Lungs: Clear to auscultation bilaterally with normal respiratory effort. CV: Nondisplaced PMI.  Heart regular S1/S2, no S3/S4, no murmur.  1+ ankle edema.  Right carotid bruit.  Normal pedal pulses.  Abdomen: Soft, nontender, no hepatosplenomegaly, no distention.  Skin: Intact without lesions or rashes.  Neurologic: Alert and oriented x 3.  Psych: Normal affect. Extremities: No clubbing or cyanosis.   Assessment/Plan: 1. Cardiomyopathy: EF 40% with diffuse hypokinesis on 11/14 echo, but most recent echo showed EF up to 55-60%.  He is euvolemic with NYHA class I symptoms.  Continue Coreg, cannot tolerate uptitration.   2. CAD: s/p NSTEMI with DES to LCx then angina with DES to pLAD.  He has not tolerated multiple statins but is now on Repatha.  No ischemic symptoms.  - Will continue ASA 81 and Brilinta 90  mg bid.  In 10/17, can decrease Brilinta to 60 mg bid.  - Continue Coreg to 3.125 mg 1/2 tab bid  - Continue Repatha. 3. Hyperlipidemia: He has not tolerated multiple statins and apparently not Zetia either.  Most recently, he failed Crestor 5 mg two times/week due to myalgias.  He is now on Repatha.  - Check lipids today.  4. Carotid bruit: 1/17 dopplers showed minimal stenosis.  5. Atrial bigeminy: Chronic.  EF is now normal on echo.  He does not tolerate higher doses of beta blockers.   Followup in 6 months with [facility] at [facility] office.  [name] 06/23/2016

## 2016-06-26 ENCOUNTER — Encounter: Payer: Self-pay | Admitting: Cardiology

## 2016-06-26 ENCOUNTER — Other Ambulatory Visit: Payer: Self-pay | Admitting: *Deleted

## 2016-06-26 MED ORDER — OMEGA-3 1000 MG PO CAPS
1000.0000 mg | ORAL_CAPSULE | Freq: Two times a day (BID) | ORAL | 11 refills | Status: DC
Start: 2016-06-26 — End: 2019-03-11

## 2016-06-26 MED ORDER — FISH OIL 1200 MG PO CAPS: 1200.0000 mg | ORAL_CAPSULE | Freq: Two times a day (BID) | ORAL | 11 refills | Status: DC

## 2016-06-26 NOTE — Telephone Encounter (Signed)
This encounter was created in error - please disregard.

## 2016-06-26 NOTE — Telephone Encounter (Signed)
Follow Up:    Returning [name]'s cal from yesterday.

## 2016-06-27 DIAGNOSIS — E298 Other testicular dysfunction: Secondary | ICD-10-CM | POA: Diagnosis not present

## 2016-06-29 DIAGNOSIS — J3089 Other allergic rhinitis: Secondary | ICD-10-CM | POA: Diagnosis not present

## 2016-06-29 DIAGNOSIS — J3081 Allergic rhinitis due to animal (cat) (dog) hair and dander: Secondary | ICD-10-CM | POA: Diagnosis not present

## 2016-06-29 DIAGNOSIS — J301 Allergic rhinitis due to pollen: Secondary | ICD-10-CM | POA: Diagnosis not present

## 2016-07-05 DIAGNOSIS — J301 Allergic rhinitis due to pollen: Secondary | ICD-10-CM | POA: Diagnosis not present

## 2016-07-05 DIAGNOSIS — J3089 Other allergic rhinitis: Secondary | ICD-10-CM | POA: Diagnosis not present

## 2016-07-05 DIAGNOSIS — J3081 Allergic rhinitis due to animal (cat) (dog) hair and dander: Secondary | ICD-10-CM | POA: Diagnosis not present

## 2016-07-10 ENCOUNTER — Emergency Department (HOSPITAL_COMMUNITY)
Admission: EM | Admit: 2016-07-10 | Discharge: 2016-07-10 | Disposition: A | Discharge status: Home / Self Care | Payer: Medicare Other | Attending: Emergency Medicine | Admitting: Emergency Medicine

## 2016-07-10 ENCOUNTER — Encounter (HOSPITAL_COMMUNITY): Payer: Self-pay | Admitting: Emergency Medicine

## 2016-07-10 ENCOUNTER — Emergency Department (HOSPITAL_COMMUNITY): Payer: Medicare Other

## 2016-07-10 DIAGNOSIS — Z7982 Long term (current) use of aspirin: Secondary | ICD-10-CM | POA: Insufficient documentation

## 2016-07-10 DIAGNOSIS — R55 Syncope and collapse: Secondary | ICD-10-CM | POA: Diagnosis not present

## 2016-07-10 DIAGNOSIS — I2510 Atherosclerotic heart disease of native coronary artery without angina pectoris: Secondary | ICD-10-CM | POA: Diagnosis not present

## 2016-07-10 DIAGNOSIS — R404 Transient alteration of awareness: Secondary | ICD-10-CM | POA: Diagnosis not present

## 2016-07-10 DIAGNOSIS — Z85828 Personal history of other malignant neoplasm of skin: Secondary | ICD-10-CM | POA: Diagnosis not present

## 2016-07-10 DIAGNOSIS — Z79899 Other long term (current) drug therapy: Secondary | ICD-10-CM | POA: Diagnosis not present

## 2016-07-10 DIAGNOSIS — E860 Dehydration: Secondary | ICD-10-CM

## 2016-07-10 DIAGNOSIS — Z955 Presence of coronary angioplasty implant and graft: Secondary | ICD-10-CM | POA: Diagnosis not present

## 2016-07-10 DIAGNOSIS — I252 Old myocardial infarction: Secondary | ICD-10-CM | POA: Insufficient documentation

## 2016-07-10 DIAGNOSIS — R42 Dizziness and giddiness: Secondary | ICD-10-CM | POA: Diagnosis not present

## 2016-07-10 LAB — CBC WITH DIFFERENTIAL/PLATELET
BASOS ABS: 0.1 10*3/uL (ref 0.0–0.1)
BASOS PCT: 1 %
EOS ABS: 0.4 10*3/uL (ref 0.0–0.7)
Eosinophils Relative: 4 %
HCT: 40.6 % (ref 39.0–52.0)
Hemoglobin: 14 g/dL (ref 13.0–17.0)
Lymphocytes Relative: 13 %
Lymphs Abs: 1.3 10*3/uL (ref 0.7–4.0)
MCH: 31.2 pg (ref 26.0–34.0)
MCHC: 34.5 g/dL (ref 30.0–36.0)
MCV: 90.4 fL (ref 78.0–100.0)
MONO ABS: 0.8 10*3/uL (ref 0.1–1.0)
MONOS PCT: 8 %
NEUTROS PCT: 74 %
Neutro Abs: 7.5 10*3/uL (ref 1.7–7.7)
Platelets: 190 10*3/uL (ref 150–400)
RBC: 4.49 MIL/uL (ref 4.22–5.81)
RDW: 14 % (ref 11.5–15.5)
WBC: 10 10*3/uL (ref 4.0–10.5)

## 2016-07-10 LAB — URINE MICROSCOPIC-ADD ON: RBC / HPF: NONE SEEN RBC/hpf (ref 0–5)

## 2016-07-10 LAB — URINALYSIS, ROUTINE W REFLEX MICROSCOPIC
BILIRUBIN URINE: NEGATIVE
GLUCOSE, UA: NEGATIVE mg/dL
HGB URINE DIPSTICK: NEGATIVE
KETONES UR: NEGATIVE mg/dL
Leukocytes, UA: NEGATIVE
Nitrite: NEGATIVE
PH: 5 (ref 5.0–8.0)
Protein, ur: 30 mg/dL — AB
SPECIFIC GRAVITY, URINE: 1.023 (ref 1.005–1.030)

## 2016-07-10 LAB — BASIC METABOLIC PANEL
Anion gap: 6 (ref 5–15)
BUN: 15 mg/dL (ref 6–20)
CALCIUM: 9.1 mg/dL (ref 8.9–10.3)
CO2: 27 mmol/L (ref 22–32)
CREATININE: 1.1 mg/dL (ref 0.61–1.24)
Chloride: 106 mmol/L (ref 101–111)
Glucose, Bld: 121 mg/dL — ABNORMAL HIGH (ref 65–99)
Potassium: 4 mmol/L (ref 3.5–5.1)
SODIUM: 139 mmol/L (ref 135–145)

## 2016-07-10 LAB — I-STAT TROPONIN, ED
TROPONIN I, POC: 0 ng/mL (ref 0.00–0.08)
Troponin i, poc: 0 ng/mL (ref 0.00–0.08)

## 2016-07-10 MED ORDER — SODIUM CHLORIDE 0.9 % IV BOLUS (SEPSIS)
500.0000 mL | Freq: Once | INTRAVENOUS | Status: AC
  Administered 2016-07-10: 500 mL via INTRAVENOUS

## 2016-07-10 NOTE — ED Triage Notes (Signed)
Pt to ER BIB [facility] from [facility] where EMS was activated for near syncopal episode while checking out of line. Pt denies syncope, states he remembers the entire event. Remembers feeling dizzy and lowering himself to the ground. EMS reports irregular bradycardia at 45 bpm with initial BP in the 60's. At this time BP 99/56 with 400 cc fluid. Pt denies chest pain or shortness of breath. Has no complaints at this time. A/o x4.

## 2016-07-10 NOTE — ED Provider Notes (Signed)
[place] DEPT Provider Note   CSN: [id number] Arrival date & time: 07/10/16  1517     History   Chief Complaint Chief Complaint  Patient presents with  . Bradycardia  . Loss of Consciousness    HPI [name], [name] is a 82 y.o. male.   Loss of Consciousness   This is a new problem. The current episode started less than 1 hour ago. The problem occurs rarely. The problem has been resolved. There was no loss of consciousness. The problem is associated with normal activity. Associated symptoms include diaphoresis, dizziness and light-headedness. Pertinent negatives include abdominal pain, back pain, bladder incontinence, chest pain, fever, focal weakness, nausea, palpitations, seizures, slurred speech and vomiting. Treatments tried: iv fluid. The treatment provided moderate relief. His past medical history is significant for CAD.    Past Medical History:  Diagnosis Date  . Atrial premature beats   . BPH (benign prostatic hyperplasia)   . BPH (benign prostatic hypertrophy)   . Cataract, right eye   . Chronic low back pain   . Colon polyp   . Coronary artery disease   . DDD (degenerative disc disease), lumbar   . Dermatitis 11/16  . Hyperlipidemia   . Insomnia   . Kidney cysts   . Memory loss 2015  . Non-ischemic cardiomyopathy ([place])   . NSTEMI (non-ST elevated myocardial infarction) ([place]) 08/25/2014  . Osteoarthritis of right knee   . Pituitary tumor 2011   macroadenoma w/ VF compromise-then gamma knife a few years leater for some recurrence  . Status post insertion of drug-eluting stent into left anterior descending (LAD) artery 08/28/15   + nuc study.   . Urinary retention   . Vitamin D deficiency 01/2008    Patient Active Problem List   Diagnosis Date Noted  . Abnormal stress test 07/29/2015  . Angina effort ([place])   . Abnormal nuclear stress test   . NSTEMI (non-ST elevated myocardial infarction) ([place]) 08/25/2014  . Basal cell carcinoma 07/15/2014  . Open  wound of nose with complication 123456  . Disease of upper respiratory system 07/15/2014  . Chronic infection of sinus 07/15/2014  . Dyssomnia 07/15/2014  . Dermatologic disease 04/05/2014  . CA of skin 04/05/2014  . Pituitary macroadenoma with extrasellar extension ([place]) 01/13/2014  . 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 Benign neoplasm of pituitary gland ([place]) 01/13/2014  . Mild cognitive disorder 01/13/2014  . PAC (premature atrial contraction) 12/10/2013  . APC (atrial premature contractions) 12/10/2013  . HYPERCHOLESTEROLEMIA  IIA 02/10/2009  . CAD, NATIVE VESSEL 02/10/2009  . Cardiomyopathy ([place]) 02/10/2009  . BRADYCARDIA 02/10/2009  . Cardiac conduction disorder 02/10/2009  . CAD in native artery 02/10/2009    Past Surgical History:  Procedure Laterality Date  . ARTHROSCOPIC SURGERY    . BACK SURGERY    . CARDIAC CATHETERIZATION  07/27/2015   Dr Aundra Dubin; oLAD 95%, mLAD 50%, CFX stent OK, OM1 60%, OM2 99%, pPLOM 50%, RCA irregular, AM2 70%, EF 55-60%, no regional wall motion abnormalities.     Marland Kitchen CARDIAC CATHETERIZATION N/A 07/28/2015   Procedure: Left Heart Cath and Coronary Angiography;  Surgeon: [name], MD;  Location: [facility];  Service: Cardiovascular;  Laterality: N/A;  . CARDIAC CATHETERIZATION N/A 07/28/2015   Procedure: Coronary Stent Intervention;  Surgeon: Troy Sine, MD;  Location: [place] [facility];  Service: Cardiovascular;  Laterality: N/A;  . CATARACT EXTRACTION    . CORONARY ANGIOPLASTY WITH STENT PLACEMENT  07/27/2015   XIENCE ALPINE RX 3.0X33 DES to the LAD, 95%>>0  . HAMMER  BUNION TOE SURGERY    . HAND SURGERY Left   . LEFT HEART CATHETERIZATION WITH CORONARY ANGIOGRAM N/A 08/25/2014   Procedure: LEFT HEART CATHETERIZATION WITH CORONARY ANGIOGRAM;  Surgeon: Burnell Blanks, MD;  Location[facility];  Service: Cardiovascular;  Laterality: N/A;  . PITUITARY SURGERY     12/01/09 Dr.  Wilburn Cornelia and [name].       Home Medications    Prior to Admission medications   Medication Sig Start Date End Date Taking? Authorizing Provider  ALPRAZolam Duanne Moron) 0.5 MG tablet Take 0.5 mg by mouth as needed for anxiety.     Historical Provider, MD  aspirin 81 MG tablet Take 81 mg by mouth daily.      Historical Provider, MD  carvedilol (COREG) 3.125 MG tablet Take 0.5 tablets (1.5625 mg total) by mouth at bedtime. 07/29/15   Rhonda G Barrett, PA-C  Coenzyme Q10 (COQ10) 200 MG CAPS Take 1 capsule by mouth daily.      Historical Provider, MD  diazepam (VALIUM) 5 MG tablet Take 5 mg by mouth as needed (for MRI).     Historical Provider, MD  EPIPEN 2-PAK 0.3 MG/0.3ML SOAJ injection Inject 1 Dose as directed as needed. 05/20/14   Historical Provider, MD  ergocalciferol (VITAMIN D2) 50000 UNITS capsule Take 50,000 Units by mouth 2 (two) times a week.     Historical Provider, MD  Evolocumab (REPATHA) 140 MG/ML SOSY Inject into the skin every 14 (fourteen) days. 02/17/15   [name], MD  finasteride (PROSCAR) 5 MG tablet Take 5 mg by mouth daily.      Historical Provider, MD  hydrocortisone (CORTEF) 10 MG tablet Take 15 mg by mouth daily. One and on half tablet daily 01/09/11   Historical Provider, MD  levothyroxine (SYNTHROID, LEVOTHROID) 150 MCG tablet Take 150 mcg by mouth daily before breakfast.     Historical Provider, MD  nitroGLYCERIN (NITROSTAT) 0.4 MG SL tablet Place 1 tablet (0.4 mg total) under the tongue every 5 (five) minutes as needed for chest pain. 09/01/14   [name], MD  Nystatin [facility]) 100000 UNIT/GM POWD Apply 1 g topically daily as needed (yeast infection).  07/07/15   Historical Provider, MD  Omega-3 1000 MG CAPS Take 1 capsule (1,000 mg total) by mouth 2 (two) times daily. 06/26/16   [name], MD  Propylene Glycol (SYSTANE BALANCE OP) Apply 2 drops to eye daily as needed (dry eye).    Historical Provider, MD  Red Yeast Rice Extract 600 MG CAPS Take 600  mg by mouth daily. 07/15/14   Historical Provider, MD  tadalafil (CIALIS) 5 MG tablet Take 1 tablet (5 mg total) by mouth daily as needed for erectile dysfunction. Do not take nitro within 48 hrs after taking cialis 07/29/15   Rhonda G Barrett, PA-C  testosterone cypionate (DEPOTESTOTERONE CYPIONATE) 200 MG/ML injection Inject into the muscle every 21 ( twenty-one) days. 1.62 % injection    Historical Provider, MD  ticagrelor (BRILINTA) 60 MG TABS tablet Take 1 tablet (60 mg total) by mouth 2 (two) times daily. 06/21/16   [name], MD  VESICARE 10 MG tablet Take 5 mg by mouth daily.  12/29/14   Historical Provider, MD    Family History Family History  Problem Relation Age of Onset  . Congestive Heart Failure Father 24  . Other Mother 97  . Coronary artery disease  Brother   . Other Sister 51    polio  . Heart attack Brother     MI/ASCAD 2006  . COPD Sister 49    Social History Social History  Substance Use Topics  . Smoking status: Never Smoker  . Smokeless tobacco: Never Used  . Alcohol use Yes     Comment: occas.     Allergies   Sulfamethoxazole; Bee venom; Sulfa antibiotics; Sulfonamide derivatives; and Tamsulosin   Review of Systems Review of Systems  Constitutional: Positive for diaphoresis. Negative for chills and fever.  HENT: Negative for ear pain and sore throat.   Eyes: Negative for pain and visual disturbance.  Respiratory: Negative for cough and shortness of breath.   Cardiovascular: Positive for syncope. Negative for chest pain and palpitations.  Gastrointestinal: Negative for abdominal pain, nausea and vomiting.  Genitourinary: Negative for bladder incontinence, dysuria and hematuria.  Musculoskeletal: Negative for arthralgias and back pain.  Skin: Negative for color change and rash.  Neurological: Positive for dizziness and light-headedness. Negative for focal weakness, seizures and syncope.  All other systems reviewed and are negative.    Physical  Exam Updated Vital Signs BP 145/71   Pulse 103   Resp 16   SpO2 96%   Physical Exam  Constitutional: He is oriented to person, place, and time. He appears well-developed and well-nourished.  HENT:  Head: Normocephalic and atraumatic.  Eyes: Conjunctivae and EOM are normal. Pupils are equal, round, and reactive to light.  Neck: Normal range of motion. Neck supple.  Cardiovascular: Normal rate and regular rhythm.   No murmur heard. Pulmonary/Chest: Effort normal and breath sounds normal. No respiratory distress.  Abdominal: Soft. There is no tenderness.  Musculoskeletal: He exhibits no edema.  Neurological: He is alert and oriented to person, place, and time. He has normal reflexes. He displays normal reflexes. No cranial nerve deficit. He exhibits normal muscle tone. Coordination normal.  Hx of Right knee surgery, areflexive.  Skin: Skin is warm and dry.  Psychiatric: He has a normal mood and affect.  Nursing note and vitals reviewed.    ED Treatments / Results  Labs (all labs ordered are listed, but only abnormal results are displayed) Labs Reviewed  BASIC METABOLIC PANEL - Abnormal; Notable for the following:       Result Value   Glucose, Bld 121 (*)    All other components within normal limits  URINALYSIS, ROUTINE W REFLEX MICROSCOPIC (NOT AT [facility]) - Abnormal; Notable for the following:    Color, Urine AMBER (*)    Protein, ur 30 (*)    All other components within normal limits  URINE MICROSCOPIC-ADD ON - Abnormal; Notable for the following:    Squamous Epithelial / LPF 0-5 (*)    Bacteria, UA RARE (*)    Casts HYALINE CASTS (*)    All other components within normal limits  CBC WITH DIFFERENTIAL/PLATELET  Randolm Idol, ED  I-STAT TROPOININ, ED    EKG  EKG Interpretation  Date/Time:  Tuesday July 10 2016 15:18:04 EDT Ventricular Rate:  51 PR Interval:    QRS Duration: 122 QT Interval:  438 QTC Calculation: 404 R Axis:   60 Text Interpretation:  Sinus  rhythm Atrial premature complexes Nonspecific intraventricular conduction delay Since last tracing rate slower Confirmed by FLOYD MD, DANIEL [phone]) on 07/10/2016 3:34:13 PM       Radiology Dg Chest 2 View  Result Date: 07/10/2016 CLINICAL DATA:  Dizziness.  Near syncope EXAM: CHEST  2 VIEW COMPARISON:  08/25/2014 FINDINGS: Mild cardiac enlargement. No pleural effusion or edema. No airspace consolidation. Mild spondylosis noted within the thoracic spine. IMPRESSION: 1. No acute cardiopulmonary abnormalities. 2. Mild cardiac enlargement. Electronically Signed   By: Kerby Moors M.D.   On: 07/10/2016 17:14    Procedures Procedures (including critical care time)  Medications Ordered in ED Medications  sodium chloride 0.9 % bolus 500 mL (0 mLs Intravenous Stopped 07/10/16 1723)     Initial Impression / Assessment and Plan / ED Course  I have reviewed the triage vital signs and the nursing notes.  Pertinent labs & imaging results that were available during my care of the patient were reviewed by me and considered in my medical decision making (see chart for details).  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  EKG obtained shows sinus bradycardia with irregular atrial pacing, consistent with priors.  Vital signs initially showed hypertension, which improved with fluids.  Patient endorses heavy laxative use with diarrhea for the past few days.  Orthostatic vital signs are positive, mucous membranes dry.  Likely patient is dehydrated.  Labs ordered including BMP, CBC, UA, delta troponin.  Results unremarkable.  Chest x-ray obtained, personally reviewed by me, demonstrates no acute cardiac or pulmonary processes.    Patient was given additional fluids and vital signs continued  to respond well.  Patient is discharged with strict return precautions, follow-up  instructions and educational materials.   Final Clinical Impressions(s) / ED Diagnoses   Final diagnoses:  Near syncope  Dehydration    New Prescriptions New Prescriptions   No medications on file     Elveria Rising, MD 07/11/16 [place], DO 07/11/16 1418

## 2016-07-17 DIAGNOSIS — E298 Other testicular dysfunction: Secondary | ICD-10-CM | POA: Diagnosis not present

## 2016-07-18 DIAGNOSIS — J301 Allergic rhinitis due to pollen: Secondary | ICD-10-CM | POA: Diagnosis not present

## 2016-07-18 DIAGNOSIS — J3089 Other allergic rhinitis: Secondary | ICD-10-CM | POA: Diagnosis not present

## 2016-07-18 DIAGNOSIS — J3081 Allergic rhinitis due to animal (cat) (dog) hair and dander: Secondary | ICD-10-CM | POA: Diagnosis not present

## 2016-07-25 ENCOUNTER — Encounter: Payer: Self-pay | Admitting: Neurology

## 2016-07-25 ENCOUNTER — Ambulatory Visit (INDEPENDENT_AMBULATORY_CARE_PROVIDER_SITE_OTHER): Payer: Medicare Other | Admitting: Neurology

## 2016-07-25 VITALS — BP 138/80 | HR 60 | Resp 20 | Ht 75.0 in | Wt 230.0 lb

## 2016-07-25 DIAGNOSIS — G3184 Mild cognitive impairment, so stated: Secondary | ICD-10-CM | POA: Diagnosis not present

## 2016-07-25 DIAGNOSIS — I6521 Occlusion and stenosis of right carotid artery: Secondary | ICD-10-CM

## 2016-07-25 NOTE — Progress Notes (Signed)
   Provider:    , M D  Referring Provider: Perini, Mark, MD Primary Care Physician:  PERINI,MARK A, MD  Chief Complaint  Patient presents with  . 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 Perini for follow up on subjective memory loss/ MCI.  01/13/14 Last visit with Dr. 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  Dr. Uliano's wife reported that her husbands personality a has changed, and she feels it is not longer all related to his severe hearing loss. He reports that he has noticed a slowing of his cognitive function over Rather than loss of memory for several years. One of his concerns he is delayed recall of names, which sometimes causes him some embarrassment in social situations. He has been a member of the national professional society for long time and is more aware that it takes him longer to recall names of colleagues he has met and encountered before.  He also states that the use at times compulsively searching for these names and there may come back to his memory with delay.  He also a likes to walk or do some physical activity while thinking about these names.  He had no trouble getting lost while driving and he reports no difficulties with abstraction, attention, or mathematics at basic checkbook balancing etc. In his Montral cognitive assessment test here today he scored 25/30 points. 26/30 is considered the normal range.  Based on this test he would be diagnosed as a mild cognitive impairment and not a dementia.  His wife stated he had fallen 4 times and she added some important surgical information, Dr Veals has a history of pituitary Macroadenoma, "the size of a golfball", with previously tunneling his left more than right vision. Dr Elsner treated it.   07-27-15 ,  Since last visit, Dr. Kissick has suffered a heart attack and is now in cardiac rehab,  followed by Dr. Dalton Mclean. feels as if there has been no change in his health or memory.  His wife agrees, but is not personally present. His  2015 MRI follow up study showed multiple foci of T2 hyperintensity are present in the subcortical and deep cerebral white matter, nonspecific but compatible with mild chronic small vessel ischemic disease and mild generalized cerebral atrophy.  Unchanged nodular tissue in the anterior sella and extending into the sphenoid sinus, compatible with residual pituitary adenoma, no change from imaging from [facility]. Dr. Stoffers performed today with us in a Mini-Mental Status Examination it revealed 29 out of 30 points and his only missed take was that he couldn't name the exact date which is 07-27-15, This test was followed by a Montral cognitive assessment test and he scored here 25 out of 30 points. The most difficult part of the test are usually clock drawing, the Trail Making Test the serial sevens and the word recall. The patient lost 3 of 5 words and word recall and did repeat one sentence incompletely. This may have been due to his hearing deficits also he is wearing a hearing aid. Visual spatial testing naming and calculation attention and abstraction as well as orientation were fully intact. He generated 19 F- words.   Interval history from 07/25/2016, I had the pleasure of seeing Dr. Cuthbertson today. A Montral cognitive assessment was performed and he scored 23 out   of 30 points. It was date and month but gave him trouble he had very good visuospatial capacities and the ability to substrate and to spell. He noted delayed recall of names,      Ewing Cognitive Assessment  07/25/2016 07/27/2015 07/28/2014 01/13/2014  Visuospatial/ Executive (0/5) _0 Naming (0/3) _1 Attention: Read list of digits (0/2) _2 Attention: Read list of letters (0/1) _3 Attention: Serial 7 subtraction starting at 100 (0/3) _4 Language: Repeat phrase (0/2) _5 Language : Fluency (0/1) _6 Abstraction (0/2) _7 Delayed Recall (0/5) _8 Orientation (0/6) _9 Total _10 Adjusted Score (based on education) _11 Review of Systems:  Out of a complete 14 system review, the patient complains of only the following symptoms, and all other reviewed systems are negative.  Hoarse voice, delayed recall, right knee replacement.  TBI in a skiing accident in 2005. Hearing loss,  OCD- compulsions are progressing according to his wife.   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 Drives. Married with adult children.     Social History   Social History  . Marital status: Married    Spouse name: [name]  . Number of children: 2  . Years of education: College   Occupational History  . retired Clinical biochemist Retired   Social History Main Topics  . Smoking status: Never Smoker  . Smokeless tobacco: Never Used  . Alcohol use Yes     Comment: occas.  . Drug use: No  . Sexual activity: Not on file   Other Topics Concern  . Not on file   Social History Narrative   Patient is married [name]).   Patient has two children.   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   Patient is right-handed.   Patient does not drink any caffeine.    Family History  Problem Relation Age of Onset  . Congestive Heart Failure Father 73  . Other Mother 3  . Coronary artery disease Brother   . Other Sister 74    polio  . Heart attack Brother     MI/ASCAD 2006  . COPD Sister 22    Past Medical History:  Diagnosis Date  . Atrial premature beats   . BPH (benign prostatic hyperplasia)   . BPH (benign prostatic hypertrophy)   . Cataract, right eye   . Chronic low back pain   . Colon polyp   . Coronary artery disease   . DDD (degenerative disc disease), lumbar   . Dermatitis 11/16  . Hyperlipidemia   . Insomnia   . Kidney cysts   . Memory loss 2015  . Non-ischemic  cardiomyopathy ([place])   . NSTEMI (non-ST elevated myocardial infarction) ([place]) 08/25/2014  . Osteoarthritis of right knee   . Pituitary tumor 2011   macroadenoma w/ VF compromise-then gamma knife a few years leater for some recurrence  . Status post insertion of drug-eluting stent into left anterior descending (LAD) artery 08/28/15   + nuc study.   . Urinary retention   . Vitamin D deficiency 01/2008    Past Surgical History:  Procedure Laterality Date  . ARTHROSCOPIC SURGERY    . BACK SURGERY    .  CARDIAC CATHETERIZATION  07/27/2015   Dr Aundra Dubin; oLAD 95%, mLAD 50%, CFX stent OK, OM1 60%, OM2 99%, pPLOM 50%, RCA irregular, AM2 70%, EF 55-60%, no regional wall motion abnormalities.     Marland Kitchen CARDIAC CATHETERIZATION N/A 07/28/2015   Procedure: Left Heart Cath and Coronary Angiography;  Surgeon: [name], MD;  Location: [facility];  Service: Cardiovascular;  Laterality: N/A;  . CARDIAC CATHETERIZATION N/A 07/28/2015   Procedure: Coronary Stent Intervention;  Surgeon: Troy Sine, MD;  Location: [facility];  Service: Cardiovascular;  Laterality: N/A;  . CATARACT EXTRACTION    . CORONARY ANGIOPLASTY WITH STENT PLACEMENT  07/27/2015   XIENCE ALPINE RX 3.0X33 DES to the LAD, 95%>>0  . HAMMER  BUNION TOE SURGERY    . HAND SURGERY Left   . LEFT HEART CATHETERIZATION WITH CORONARY ANGIOGRAM N/A 08/25/2014   Procedure: LEFT HEART CATHETERIZATION WITH CORONARY ANGIOGRAM;  Surgeon: Burnell Blanks, MD;  [facility] CATH [facility];  Service: Cardiovascular;  Laterality: N/A;  . PITUITARY SURGERY     12/01/09 Dr. Wilburn Cornelia and [name].    Current Outpatient Prescriptions  Medication Sig Dispense Refill  . ALPRAZolam (XANAX) 0.5 MG tablet Take 0.5 mg by mouth as needed for anxiety.     Marland Kitchen aspirin 81 MG tablet Take 81 mg by mouth daily.      . carvedilol (COREG) 3.125 MG tablet Take 0.5 tablets (1.5625 mg total) by mouth at bedtime. 30 tablet 6  . Coenzyme Q10 (COQ10) 200 MG  CAPS Take 1 capsule by mouth daily.      . diazepam (VALIUM) 5 MG tablet Take 5 mg by mouth as needed (for MRI).     Marland Kitchen EPIPEN 2-PAK 0.3 MG/0.3ML SOAJ injection Inject 1 Dose as directed as needed.  0  . ergocalciferol (VITAMIN D2) 50000 UNITS capsule Take 50,000 Units by mouth 2 (two) times a week.     . Evolocumab (REPATHA) 140 MG/ML SOSY Inject into the skin every 14 (fourteen) days.    . finasteride (PROSCAR) 5 MG tablet Take 5 mg by mouth daily.      . hydrocortisone (CORTEF) 10 MG tablet Take 15 mg by mouth daily. One and on half tablet daily    . levothyroxine (SYNTHROID, LEVOTHROID) 150 MCG tablet Take 150 mcg by mouth daily before breakfast.     . nitroGLYCERIN (NITROSTAT) 0.4 MG SL tablet Place 1 tablet (0.4 mg total) under the tongue every 5 (five) minutes as needed for chest pain. 90 tablet 3  . Nystatin (NYAMYC) 100000 UNIT/GM POWD Apply 1 g topically daily as needed (yeast infection).   10  . Omega-3 1000 MG CAPS Take 1 capsule (1,000 mg total) by mouth 2 (two) times daily. 60 capsule 11  . Propylene Glycol (SYSTANE BALANCE OP) Apply 2 drops to eye daily as needed (dry eye).    . Red Yeast Rice Extract 600 MG CAPS Take 600 mg by mouth daily.    . tadalafil (CIALIS) 5 MG tablet Take 1 tablet (5 mg total) by mouth daily as needed for erectile dysfunction. Do not take nitro within 48 hrs after taking cialis 10 tablet 1  . testosterone cypionate (DEPOTESTOTERONE CYPIONATE) 200 MG/ML injection Inject into the muscle every 21 ( twenty-one) days. 1.62 % injection    . ticagrelor (BRILINTA) 60 MG TABS tablet Take 1 tablet (60 mg total) by mouth 2 (two) times daily. 180 tablet 3  . tolterodine (DETROL LA) 4 MG 24 hr capsule  Take 4 mg by mouth daily.  0  . VESICARE 10 MG tablet Take 5 mg by mouth daily.   10   No current facility-administered medications for this visit.     Allergies as of 07/25/2016 - Review Complete 07/25/2016  Allergen Reaction Noted  . Sulfamethoxazole Anaphylaxis and  Swelling 09/19/2015  . Bee venom Rash 09/19/2015  . Sulfa antibiotics Nausea And Vomiting 07/15/2014  . Sulfonamide derivatives Nausea And Vomiting 02/10/2009  . Tamsulosin  02/10/2009    Vitals: BP 138/80   Pulse 60   Resp 20   Ht 6' 3" (1.905 m)   Wt 230 lb (104.3 kg)   BMI 28.75 kg/m  Last Weight:  Wt Readings from Last 1 Encounters:  07/25/16 230 lb (104.3 kg)   YKZ:LDJT mass index is 28.75 kg/m.     Last Height:   Ht Readings from Last 1 Encounters:  07/25/16 6' 3" (1.905 m)    Physical exam:  General: The patient is awake, alert and appears not in acute distress. The patient is well groomed. Head: Normocephalic, atraumatic. Cardiovascular:  Regular rate and rhythm , without  murmurs or carotid bruit, and without distended neck veins. Respiratory: Lungs are clear to auscultation. Skin:  Without evidence of edema, or rash Trunk: The patient's posture is erect, no scoliosis.   Neurologic exam : The patient is awake and alert, oriented to place and time.   Memory subjective  described as intact.  Memory testing revealed  29-30 points on MMSE and 23 out of 30 on MOCA, at best a MCI. Not a dementia.  OCD tendencies confirmed by the patient himself.   MOCA: Montreal Cognitive Assessment  07/25/2016 07/27/2015 07/28/2014 01/13/2014  Visuospatial/ Executive (0/5) _0 Naming (0/3) _1 Attention: Read list of digits (0/2) _2 Attention: Read list of letters (0/1) _3 Attention: Serial 7 subtraction starting at 100 (0/3) _4 Language: Repeat phrase (0/2) _5 Language : Fluency (0/1) _6 Abstraction (0/2) _7 Delayed Recall (0/5) _8 Orientation (0/6) _9 Total _10 Adjusted Score (based on education) _11 MMSE: MMSE - Mini Mental State Exam 07/27/2015  Orientation to time 4  Orientation to Place 5  Registration 3  Attention/ Calculation 5  Recall 3  Language- name 2 objects 2  Language- repeat 1    Language- follow 3 step command 3  Language- read & follow direction 1  Write a sentence 1  Copy design 1  Total score 29   Neck 16.5 inches. Mallampati 2.    Attention span & concentration ability appears normal.  Speech is fluent, without dysarthria, but with strong dysphonia or aphasia.  Mood and affect are appropriate.  Cranial nerves: Pupils are equal and briskly reactive to light.  Extraocular movements  in vertical and horizontal planes intact and without nystagmus. Visual fields by finger perimetry are intact. Hearing; hearing aids in place.    Facial sensation intact to fine touch.  Facial motor strength is symmetric and tongue and uvula move midline. Shoulder shrug was symmetrical.   Gait and station: Patient walks without assistive device and is able unassisted to climb up to the exam table. Strength within normal limits.  Stance is stable and normal based   The patient rises  from a seated position without bracing himself. He stands erect. Toe and heel stand were tested . Tandem gait is unfragmented. Turns with 3 Steps. Romberg testing is negative.  Deep tendon reflexes: in the upper and lower extremities are symmetric and intact. Babinski maneuver response is  downgoing.  The patient was advised of the nature of the diagnosed sleep disorder , the treatment options and risks for general a health and wellness arising from not treating the condition.  I spent more than 25 minutes of face to face time with the patient. Greater than 50% of time was spent in counseling and coordination of care. We have discussed the diagnosis and differential and I answered the patient's questions.   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 He walks fast paced every day. He should continue.    Assessment:  Cardiology note- PMH: 1. Hyperlipidemia: He has been unable to tolerate statins.  2. Chronic atrial bigeminy: x years.  3.  Hypothyroidism: s/p surgery for pituitary adenoma.  4. Pituitary adenoma: s/p surgery in 3/11 for removal. Now on thyroid replacement and hydrocortisone.  5. Cardiomyopathy: Nonischemic. LHC (2009) with 50% mid LAD, 60% distal LAD. Left ventriculogram (2009) with EF 40-45%. Echo (8/13) with EF 35-40%, mild LVH. Echo (11/14) with EF 40%, diffuse hypokinesis, normal RV. He was tried in the past on an ACEI but apparently at that time began to have side effects from his pituitary adenoma that were thought initially to be ACEI-related. He has been off ACEI since that time. He has had trouble tolerating more that 1/2 tab of 3.125 mg Coreg once a day. Echo (11/15) with EF 55-60%, aortic sclerosis without significant stenosis.  6. CAD: 2009 LHC with 50% mid, 60% distal LAD. NSTEMI 11/15 with 60-70% pLAD, totally occluded LCx with DES to proximal LCx. EF 55-60% by LV-gram.  7. Mild cognitive impairment- 23-30 MOCA .      After physical and neurologic examination, review of laboratory studies,  Personal review of imaging studies, reports of other /same  Imaging studies, Results of polysomnography/ neurophysiology testing and pre-existing records as far as provided in visit., my assessment is   1) MCI, borderline,  MOCA . I have voiced my concern about the patient taking Detrol 4 mg in a 24-hour extended release form. As an anticholinergic medicine it should not be used with a borderline memory score. The patient feels that his urinary issues are minor, urinary frequency does not disturb his sleep. He usually has one bathroom break at night. If this is the case I would like him to wean off or discontinue the Detrol.  2) OCD- he sees no need to treat.    Plan:  Treatment plan and additional workup :  Follow yearly for [facility] and MMSE.  Mild cognitive impairment has a 7% conversion rate into more substantial forms of memory loss. This is a yearly conversion rate. Dr. Belenda Cruise at this time is not  considered demented. Detrol may be d/c if not needed for urgency.    [name] Partridge Sunset Joshi MD  07/25/2016   CC: [name], [address], [place] [id number]  [place]; [name], MD

## 2016-07-25 NOTE — Patient Instructions (Signed)
Consider to discuss with your neurologist if Detrol is needed. Detrol is an anticholinergic medication and therefore can inhibit some memory function it may also void the effects of Aricept . Instead of having a revisit one year from now I would like a revisit in 6 months.

## 2016-07-30 DIAGNOSIS — J301 Allergic rhinitis due to pollen: Secondary | ICD-10-CM | POA: Diagnosis not present

## 2016-07-30 DIAGNOSIS — J3089 Other allergic rhinitis: Secondary | ICD-10-CM | POA: Diagnosis not present

## 2016-07-30 DIAGNOSIS — J3081 Allergic rhinitis due to animal (cat) (dog) hair and dander: Secondary | ICD-10-CM | POA: Diagnosis not present

## 2016-08-16 DIAGNOSIS — N3281 Overactive bladder: Secondary | ICD-10-CM | POA: Diagnosis not present

## 2016-08-16 DIAGNOSIS — Z23 Encounter for immunization: Secondary | ICD-10-CM | POA: Diagnosis not present

## 2016-08-16 DIAGNOSIS — R55 Syncope and collapse: Secondary | ICD-10-CM | POA: Diagnosis not present

## 2016-08-16 DIAGNOSIS — E784 Other hyperlipidemia: Secondary | ICD-10-CM | POA: Diagnosis not present

## 2016-08-16 DIAGNOSIS — N401 Enlarged prostate with lower urinary tract symptoms: Secondary | ICD-10-CM | POA: Diagnosis not present

## 2016-08-16 DIAGNOSIS — I2510 Atherosclerotic heart disease of native coronary artery without angina pectoris: Secondary | ICD-10-CM | POA: Diagnosis not present

## 2016-08-16 DIAGNOSIS — E038 Other specified hypothyroidism: Secondary | ICD-10-CM | POA: Diagnosis not present

## 2016-08-16 DIAGNOSIS — E230 Hypopituitarism: Secondary | ICD-10-CM | POA: Diagnosis not present

## 2016-08-16 DIAGNOSIS — R413 Other amnesia: Secondary | ICD-10-CM | POA: Diagnosis not present

## 2016-08-16 DIAGNOSIS — R7301 Impaired fasting glucose: Secondary | ICD-10-CM | POA: Diagnosis not present

## 2016-08-16 DIAGNOSIS — Z6829 Body mass index (BMI) 29.0-29.9, adult: Secondary | ICD-10-CM | POA: Diagnosis not present

## 2016-08-21 DIAGNOSIS — J3089 Other allergic rhinitis: Secondary | ICD-10-CM | POA: Diagnosis not present

## 2016-08-21 DIAGNOSIS — J301 Allergic rhinitis due to pollen: Secondary | ICD-10-CM | POA: Diagnosis not present

## 2016-08-21 DIAGNOSIS — J3081 Allergic rhinitis due to animal (cat) (dog) hair and dander: Secondary | ICD-10-CM | POA: Diagnosis not present

## 2016-08-30 DIAGNOSIS — E298 Other testicular dysfunction: Secondary | ICD-10-CM | POA: Diagnosis not present

## 2016-08-30 DIAGNOSIS — J3089 Other allergic rhinitis: Secondary | ICD-10-CM | POA: Diagnosis not present

## 2016-08-30 DIAGNOSIS — J301 Allergic rhinitis due to pollen: Secondary | ICD-10-CM | POA: Diagnosis not present

## 2016-08-30 DIAGNOSIS — J3081 Allergic rhinitis due to animal (cat) (dog) hair and dander: Secondary | ICD-10-CM | POA: Diagnosis not present

## 2016-09-12 DIAGNOSIS — E298 Other testicular dysfunction: Secondary | ICD-10-CM | POA: Diagnosis not present

## 2016-09-17 DIAGNOSIS — J3081 Allergic rhinitis due to animal (cat) (dog) hair and dander: Secondary | ICD-10-CM | POA: Diagnosis not present

## 2016-09-17 DIAGNOSIS — J3089 Other allergic rhinitis: Secondary | ICD-10-CM | POA: Diagnosis not present

## 2016-09-17 DIAGNOSIS — J301 Allergic rhinitis due to pollen: Secondary | ICD-10-CM | POA: Diagnosis not present

## 2016-10-04 ENCOUNTER — Other Ambulatory Visit: Payer: Self-pay | Admitting: Physician Assistant

## 2016-10-04 DIAGNOSIS — I2510 Atherosclerotic heart disease of native coronary artery without angina pectoris: Secondary | ICD-10-CM

## 2016-10-04 DIAGNOSIS — I428 Other cardiomyopathies: Secondary | ICD-10-CM

## 2016-10-04 DIAGNOSIS — J301 Allergic rhinitis due to pollen: Secondary | ICD-10-CM | POA: Diagnosis not present

## 2016-10-04 DIAGNOSIS — J3081 Allergic rhinitis due to animal (cat) (dog) hair and dander: Secondary | ICD-10-CM | POA: Diagnosis not present

## 2016-10-04 DIAGNOSIS — J3089 Other allergic rhinitis: Secondary | ICD-10-CM | POA: Diagnosis not present

## 2016-10-16 DIAGNOSIS — J3081 Allergic rhinitis due to animal (cat) (dog) hair and dander: Secondary | ICD-10-CM | POA: Diagnosis not present

## 2016-10-16 DIAGNOSIS — J3089 Other allergic rhinitis: Secondary | ICD-10-CM | POA: Diagnosis not present

## 2016-10-16 DIAGNOSIS — J301 Allergic rhinitis due to pollen: Secondary | ICD-10-CM | POA: Diagnosis not present

## 2016-10-19 DIAGNOSIS — E298 Other testicular dysfunction: Secondary | ICD-10-CM | POA: Diagnosis not present

## 2016-11-05 DIAGNOSIS — J301 Allergic rhinitis due to pollen: Secondary | ICD-10-CM | POA: Diagnosis not present

## 2016-11-05 DIAGNOSIS — J3089 Other allergic rhinitis: Secondary | ICD-10-CM | POA: Diagnosis not present

## 2016-11-05 DIAGNOSIS — J3081 Allergic rhinitis due to animal (cat) (dog) hair and dander: Secondary | ICD-10-CM | POA: Diagnosis not present

## 2016-11-29 DIAGNOSIS — J301 Allergic rhinitis due to pollen: Secondary | ICD-10-CM | POA: Diagnosis not present

## 2016-11-29 DIAGNOSIS — J3081 Allergic rhinitis due to animal (cat) (dog) hair and dander: Secondary | ICD-10-CM | POA: Diagnosis not present

## 2016-11-29 DIAGNOSIS — J3089 Other allergic rhinitis: Secondary | ICD-10-CM | POA: Diagnosis not present

## 2016-12-02 ENCOUNTER — Encounter (HOSPITAL_COMMUNITY): Payer: Self-pay | Admitting: Emergency Medicine

## 2016-12-02 ENCOUNTER — Emergency Department (HOSPITAL_COMMUNITY): Payer: Medicare Other

## 2016-12-02 ENCOUNTER — Inpatient Hospital Stay (HOSPITAL_COMMUNITY)
Admission: EM | Admit: 2016-12-02 | Discharge: 2016-12-06 | DRG: 644 | Disposition: A | Discharge status: Home / Self Care | Payer: Medicare Other | Attending: Internal Medicine | Admitting: Internal Medicine

## 2016-12-02 ENCOUNTER — Observation Stay (HOSPITAL_COMMUNITY): Payer: Medicare Other

## 2016-12-02 DIAGNOSIS — K8020 Calculus of gallbladder without cholecystitis without obstruction: Secondary | ICD-10-CM | POA: Diagnosis not present

## 2016-12-02 DIAGNOSIS — Z888 Allergy status to other drugs, medicaments and biological substances status: Secondary | ICD-10-CM

## 2016-12-02 DIAGNOSIS — I252 Old myocardial infarction: Secondary | ICD-10-CM

## 2016-12-02 DIAGNOSIS — G3184 Mild cognitive impairment, so stated: Secondary | ICD-10-CM | POA: Diagnosis present

## 2016-12-02 DIAGNOSIS — F419 Anxiety disorder, unspecified: Secondary | ICD-10-CM | POA: Diagnosis present

## 2016-12-02 DIAGNOSIS — I2510 Atherosclerotic heart disease of native coronary artery without angina pectoris: Secondary | ICD-10-CM | POA: Diagnosis present

## 2016-12-02 DIAGNOSIS — I429 Cardiomyopathy, unspecified: Secondary | ICD-10-CM

## 2016-12-02 DIAGNOSIS — E2749 Other adrenocortical insufficiency: Principal | ICD-10-CM | POA: Diagnosis present

## 2016-12-02 DIAGNOSIS — M545 Low back pain: Secondary | ICD-10-CM | POA: Diagnosis present

## 2016-12-02 DIAGNOSIS — Z85828 Personal history of other malignant neoplasm of skin: Secondary | ICD-10-CM

## 2016-12-02 DIAGNOSIS — N401 Enlarged prostate with lower urinary tract symptoms: Secondary | ICD-10-CM | POA: Diagnosis present

## 2016-12-02 DIAGNOSIS — W01198A Fall on same level from slipping, tripping and stumbling with subsequent striking against other object, initial encounter: Secondary | ICD-10-CM | POA: Diagnosis present

## 2016-12-02 DIAGNOSIS — Z9181 History of falling: Secondary | ICD-10-CM

## 2016-12-02 DIAGNOSIS — Z91030 Bee allergy status: Secondary | ICD-10-CM

## 2016-12-02 DIAGNOSIS — S3991XA Unspecified injury of abdomen, initial encounter: Secondary | ICD-10-CM | POA: Diagnosis not present

## 2016-12-02 DIAGNOSIS — I4519 Other right bundle-branch block: Secondary | ICD-10-CM | POA: Diagnosis present

## 2016-12-02 DIAGNOSIS — E871 Hypo-osmolality and hyponatremia: Secondary | ICD-10-CM | POA: Diagnosis present

## 2016-12-02 DIAGNOSIS — A0839 Other viral enteritis: Secondary | ICD-10-CM | POA: Diagnosis present

## 2016-12-02 DIAGNOSIS — R55 Syncope and collapse: Secondary | ICD-10-CM | POA: Diagnosis present

## 2016-12-02 DIAGNOSIS — E860 Dehydration: Secondary | ICD-10-CM | POA: Diagnosis not present

## 2016-12-02 DIAGNOSIS — M1711 Unilateral primary osteoarthritis, right knee: Secondary | ICD-10-CM | POA: Diagnosis present

## 2016-12-02 DIAGNOSIS — Z87898 Personal history of other specified conditions: Secondary | ICD-10-CM

## 2016-12-02 DIAGNOSIS — Z79899 Other long term (current) drug therapy: Secondary | ICD-10-CM

## 2016-12-02 DIAGNOSIS — Z7902 Long term (current) use of antithrombotics/antiplatelets: Secondary | ICD-10-CM

## 2016-12-02 DIAGNOSIS — Z881 Allergy status to other antibiotic agents status: Secondary | ICD-10-CM

## 2016-12-02 DIAGNOSIS — I5042 Chronic combined systolic (congestive) and diastolic (congestive) heart failure: Secondary | ICD-10-CM | POA: Diagnosis not present

## 2016-12-02 DIAGNOSIS — S022XXA Fracture of nasal bones, initial encounter for closed fracture: Secondary | ICD-10-CM | POA: Diagnosis not present

## 2016-12-02 DIAGNOSIS — Z7952 Long term (current) use of systemic steroids: Secondary | ICD-10-CM

## 2016-12-02 DIAGNOSIS — G4700 Insomnia, unspecified: Secondary | ICD-10-CM | POA: Diagnosis present

## 2016-12-02 DIAGNOSIS — N3281 Overactive bladder: Secondary | ICD-10-CM

## 2016-12-02 DIAGNOSIS — Z8249 Family history of ischemic heart disease and other diseases of the circulatory system: Secondary | ICD-10-CM

## 2016-12-02 DIAGNOSIS — R404 Transient alteration of awareness: Secondary | ICD-10-CM | POA: Diagnosis not present

## 2016-12-02 DIAGNOSIS — G8929 Other chronic pain: Secondary | ICD-10-CM | POA: Diagnosis present

## 2016-12-02 DIAGNOSIS — N179 Acute kidney failure, unspecified: Secondary | ICD-10-CM

## 2016-12-02 DIAGNOSIS — Z882 Allergy status to sulfonamides status: Secondary | ICD-10-CM

## 2016-12-02 DIAGNOSIS — I951 Orthostatic hypotension: Secondary | ICD-10-CM

## 2016-12-02 DIAGNOSIS — E7800 Pure hypercholesterolemia, unspecified: Secondary | ICD-10-CM | POA: Diagnosis present

## 2016-12-02 DIAGNOSIS — D72829 Elevated white blood cell count, unspecified: Secondary | ICD-10-CM

## 2016-12-02 DIAGNOSIS — W19XXXA Unspecified fall, initial encounter: Secondary | ICD-10-CM | POA: Diagnosis present

## 2016-12-02 DIAGNOSIS — R197 Diarrhea, unspecified: Secondary | ICD-10-CM | POA: Diagnosis present

## 2016-12-02 DIAGNOSIS — R338 Other retention of urine: Secondary | ICD-10-CM | POA: Diagnosis present

## 2016-12-02 DIAGNOSIS — E2740 Unspecified adrenocortical insufficiency: Secondary | ICD-10-CM | POA: Diagnosis present

## 2016-12-02 DIAGNOSIS — I4510 Unspecified right bundle-branch block: Secondary | ICD-10-CM

## 2016-12-02 DIAGNOSIS — Z955 Presence of coronary angioplasty implant and graft: Secondary | ICD-10-CM

## 2016-12-02 DIAGNOSIS — R296 Repeated falls: Secondary | ICD-10-CM | POA: Diagnosis present

## 2016-12-02 DIAGNOSIS — I255 Ischemic cardiomyopathy: Secondary | ICD-10-CM | POA: Diagnosis present

## 2016-12-02 DIAGNOSIS — I491 Atrial premature depolarization: Secondary | ICD-10-CM | POA: Diagnosis present

## 2016-12-02 DIAGNOSIS — E785 Hyperlipidemia, unspecified: Secondary | ICD-10-CM | POA: Diagnosis present

## 2016-12-02 DIAGNOSIS — S0121XA Laceration without foreign body of nose, initial encounter: Secondary | ICD-10-CM | POA: Diagnosis present

## 2016-12-02 DIAGNOSIS — K219 Gastro-esophageal reflux disease without esophagitis: Secondary | ICD-10-CM | POA: Diagnosis present

## 2016-12-02 DIAGNOSIS — Z7982 Long term (current) use of aspirin: Secondary | ICD-10-CM

## 2016-12-02 LAB — CBC WITH DIFFERENTIAL/PLATELET
BASOS PCT: 0 %
Basophils Absolute: 0 10*3/uL (ref 0.0–0.1)
EOS ABS: 0.1 10*3/uL (ref 0.0–0.7)
Eosinophils Relative: 1 %
HEMATOCRIT: 43.4 % (ref 39.0–52.0)
HEMOGLOBIN: 15 g/dL (ref 13.0–17.0)
LYMPHS ABS: 1 10*3/uL (ref 0.7–4.0)
Lymphocytes Relative: 8 %
MCH: 31.2 pg (ref 26.0–34.0)
MCHC: 34.6 g/dL (ref 30.0–36.0)
MCV: 90.2 fL (ref 78.0–100.0)
MONOS PCT: 13 %
Monocytes Absolute: 1.5 10*3/uL — ABNORMAL HIGH (ref 0.1–1.0)
NEUTROS PCT: 78 %
Neutro Abs: 9.6 10*3/uL — ABNORMAL HIGH (ref 1.7–7.7)
Platelets: 195 10*3/uL (ref 150–400)
RBC: 4.81 MIL/uL (ref 4.22–5.81)
RDW: 13.5 % (ref 11.5–15.5)
WBC: 12.3 10*3/uL — AB (ref 4.0–10.5)

## 2016-12-02 LAB — BASIC METABOLIC PANEL
ANION GAP: 8 (ref 5–15)
BUN: 20 mg/dL (ref 6–20)
CHLORIDE: 102 mmol/L (ref 101–111)
CO2: 25 mmol/L (ref 22–32)
Calcium: 9.1 mg/dL (ref 8.9–10.3)
Creatinine, Ser: 1.35 mg/dL — ABNORMAL HIGH (ref 0.61–1.24)
GFR calc non Af Amer: 48 mL/min — ABNORMAL LOW (ref >60)
GFR, EST AFRICAN AMERICAN: 56 mL/min — AB (ref >60)
Glucose, Bld: 102 mg/dL — ABNORMAL HIGH (ref 65–99)
POTASSIUM: 4.3 mmol/L (ref 3.5–5.1)
SODIUM: 135 mmol/L (ref 135–145)

## 2016-12-02 LAB — URINALYSIS, ROUTINE W REFLEX MICROSCOPIC
Bacteria, UA: NONE SEEN
Bilirubin Urine: NEGATIVE
GLUCOSE, UA: NEGATIVE mg/dL
KETONES UR: NEGATIVE mg/dL
LEUKOCYTES UA: NEGATIVE
NITRITE: NEGATIVE
PH: 5 (ref 5.0–8.0)
Protein, ur: NEGATIVE mg/dL
SQUAMOUS EPITHELIAL / LPF: NONE SEEN
Specific Gravity, Urine: 1.017 (ref 1.005–1.030)
WBC, UA: NONE SEEN WBC/hpf (ref 0–5)

## 2016-12-02 LAB — PROTIME-INR
INR: 1.19
PROTHROMBIN TIME: 15.2 s (ref 11.4–15.2)

## 2016-12-02 LAB — TROPONIN I

## 2016-12-02 MED ORDER — ALPRAZOLAM 0.5 MG PO TABS
0.5000 mg | ORAL_TABLET | Freq: Every day | ORAL | Status: DC | PRN
  Administered 2016-12-02 – 2016-12-05 (×4): 0.5 mg via ORAL
  Filled 2016-12-02 (×5): qty 1

## 2016-12-02 MED ORDER — ACETAMINOPHEN 650 MG RE SUPP: 650.0000 mg | Freq: Four times a day (QID) | RECTAL | Status: DC | PRN

## 2016-12-02 MED ORDER — HYDROCORTISONE 5 MG PO TABS
15.0000 mg | ORAL_TABLET | Freq: Every day | ORAL | Status: DC
  Administered 2016-12-03: 15 mg via ORAL
  Filled 2016-12-02 (×2): qty 1

## 2016-12-02 MED ORDER — SODIUM CHLORIDE 0.9 % IV SOLN
INTRAVENOUS | Status: DC
  Administered 2016-12-02 – 2016-12-03 (×3): via INTRAVENOUS

## 2016-12-02 MED ORDER — SODIUM CHLORIDE 0.9% FLUSH
3.0000 mL | Freq: Two times a day (BID) | INTRAVENOUS | Status: DC
  Administered 2016-12-03 – 2016-12-06 (×5): 3 mL via INTRAVENOUS

## 2016-12-02 MED ORDER — FESOTERODINE FUMARATE ER 8 MG PO TB24
8.0000 mg | ORAL_TABLET | Freq: Every day | ORAL | Status: DC
  Administered 2016-12-03: 8 mg via ORAL
  Filled 2016-12-02 (×3): qty 1

## 2016-12-02 MED ORDER — MIRABEGRON ER 50 MG PO TB24
50.0000 mg | ORAL_TABLET | Freq: Every day | ORAL | Status: DC
  Administered 2016-12-03: 50 mg via ORAL
  Filled 2016-12-02 (×3): qty 1

## 2016-12-02 MED ORDER — ALPRAZOLAM 0.5 MG PO TABS: 0.5000 mg | ORAL_TABLET | ORAL | Status: DC | PRN

## 2016-12-02 MED ORDER — DARIFENACIN HYDROBROMIDE ER 15 MG PO TB24
15.0000 mg | ORAL_TABLET | Freq: Every day | ORAL | Status: DC
  Administered 2016-12-03: 15 mg via ORAL
  Filled 2016-12-02 (×3): qty 1

## 2016-12-02 MED ORDER — OMEGA-3 1000 MG PO CAPS
1000.0000 mg | ORAL_CAPSULE | Freq: Two times a day (BID) | ORAL | Status: DC
  Filled 2016-12-02 (×2): qty 1

## 2016-12-02 MED ORDER — PANTOPRAZOLE SODIUM 40 MG PO TBEC
80.0000 mg | DELAYED_RELEASE_TABLET | Freq: Every day | ORAL | Status: DC
  Administered 2016-12-03 – 2016-12-06 (×4): 80 mg via ORAL
  Filled 2016-12-02 (×4): qty 2

## 2016-12-02 MED ORDER — NITROGLYCERIN 0.4 MG SL SUBL: 0.4000 mg | SUBLINGUAL_TABLET | SUBLINGUAL | Status: DC | PRN

## 2016-12-02 MED ORDER — LEVOTHYROXINE SODIUM 75 MCG PO TABS
150.0000 ug | ORAL_TABLET | Freq: Every day | ORAL | Status: DC
  Administered 2016-12-03 – 2016-12-05 (×3): 150 ug via ORAL
  Filled 2016-12-02: qty 2
  Filled 2016-12-02: qty 1
  Filled 2016-12-02 (×2): qty 2

## 2016-12-02 MED ORDER — POLYETHYLENE GLYCOL 3350 17 G PO PACK: 17.0000 g | PACK | Freq: Every day | ORAL | Status: DC | PRN

## 2016-12-02 MED ORDER — IOPAMIDOL (ISOVUE-300) INJECTION 61%
INTRAVENOUS | Status: AC
  Filled 2016-12-02: qty 30

## 2016-12-02 MED ORDER — ASPIRIN 81 MG PO CHEW
81.0000 mg | CHEWABLE_TABLET | Freq: Every day | ORAL | Status: DC
  Administered 2016-12-03 – 2016-12-06 (×4): 81 mg via ORAL
  Filled 2016-12-02 (×4): qty 1

## 2016-12-02 MED ORDER — TICAGRELOR 60 MG PO TABS
60.0000 mg | ORAL_TABLET | Freq: Two times a day (BID) | ORAL | Status: DC
  Administered 2016-12-02 – 2016-12-06 (×8): 60 mg via ORAL
  Filled 2016-12-02 (×8): qty 1

## 2016-12-02 MED ORDER — ZOLPIDEM TARTRATE 5 MG PO TABS: 5.0000 mg | ORAL_TABLET | Freq: Every evening | ORAL | Status: DC | PRN

## 2016-12-02 MED ORDER — FINASTERIDE 5 MG PO TABS
5.0000 mg | ORAL_TABLET | Freq: Every day | ORAL | Status: DC
  Administered 2016-12-03 – 2016-12-06 (×4): 5 mg via ORAL
  Filled 2016-12-02 (×4): qty 1

## 2016-12-02 MED ORDER — ONDANSETRON HCL 4 MG/2ML IJ SOLN: 4.0000 mg | Freq: Four times a day (QID) | INTRAMUSCULAR | Status: DC | PRN

## 2016-12-02 MED ORDER — CARVEDILOL 3.125 MG PO TABS
1.5625 mg | ORAL_TABLET | Freq: Every day | ORAL | Status: DC
  Administered 2016-12-02: 1.5625 mg via ORAL
  Filled 2016-12-02: qty 1

## 2016-12-02 MED ORDER — ACETAMINOPHEN 325 MG PO TABS
650.0000 mg | ORAL_TABLET | Freq: Four times a day (QID) | ORAL | Status: DC | PRN
  Administered 2016-12-02: 650 mg via ORAL
  Filled 2016-12-02: qty 2

## 2016-12-02 MED ORDER — ONDANSETRON HCL 4 MG PO TABS: 4.0000 mg | ORAL_TABLET | Freq: Four times a day (QID) | ORAL | Status: DC | PRN

## 2016-12-02 NOTE — ED Notes (Addendum)
Wife at bedside assisting pt to get back in bed from bedside commode and pt requesting urinal.  Wife handed him urinal and pt incontinent of large amount of watery diarrhea on himself, bed, and floor.  RN x 2 at bedside cleaning pt, linens, and floor.  Pt and wife encouraged again to wait for staff to help him.  Explained to pt and wife that we don't want pt to fall.  Encouraged both of them to call for assistance when pt needs to be moved. Yellow fall risk band placed on pt.  Pt's yellow socks had diarrhea on them.  Feet cleaned and a new pair of yellow socks applied.  Linens changed.  Warm blankets given and pt back in bed on cardiac monitor.

## 2016-12-02 NOTE — ED Notes (Signed)
Pt face, nose and hands cleaned of dried blood. Laceration noted to central top of nose. Laceration noted to upper left lip. Laceration noted to right hand. Bleeding controlled. Large clot removed by patient from left nostril.

## 2016-12-02 NOTE — ED Triage Notes (Addendum)
Per EMS- pt has had several syncopal episodes, including yesterday when he fell but did not call EMS. Pt has been struggling with increased incontinence and has been rushing to the bathroom. Today pt was going to bathroom when he fell face forward, hitting his nose. Pt denies pain anywhere, nose has swelling and deformity with dried blood to face and hands. Dried blood also noted to bilateral knees. Pt arrives to ED covered in stool. Cleaned and placed in dry gown and given warm blankets. Pt reports before falling he feels dizzy. Pt has old bruising to central chest, and pt reports his right knee is still from a recent fall.

## 2016-12-02 NOTE — ED Provider Notes (Signed)
[facility] DEPT Provider Note   CSN: [id number] Arrival date & time: 12/02/16  [id number]     History   Chief Complaint Chief Complaint  Patient presents with  . Loss of Consciousness  . 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  HPI The patient presents to the emergency room for evaluation of a facial injury. The patient lives at home with his wife. According to the EMS report the patient's had several falls recently. The patient's son and the patient himself do not mention several falls. The son states the patient had a fall last evening. This occurred after eating he felt dizzy and lightheaded while walking. He was helped to a chair. The patient had another fall this morning.  According to the son the patient still had his pants around his ankles and may have tripped over his pants when he fell. The patient states he felt dizzy and lightheaded again and fell. He is not sure if he had any loss of consciousness. He did fall straight down onto his face. When the patient arrived in the emergency room he was alert and oriented but had obvious facial injuries and was covered in stool. He denies any trouble with chest pain or shortness of breath. No focal numbness or weakness. No headache, abdominal pain neck pain chest pain or pain in his extremities. He denies any other injuries from the fall other than the facial injuries. Past Medical History:  Diagnosis Date  . Atrial premature beats   . BPH (benign prostatic hyperplasia)   . BPH (benign prostatic hypertrophy)   . Cataract, right eye   . Chronic low back pain   . Colon polyp   . Coronary artery disease   . DDD (degenerative disc disease), lumbar   . Dermatitis 11/16  . Hyperlipidemia   . Insomnia   . Kidney cysts   . Memory loss 2015  . Non-ischemic cardiomyopathy ([place])   . NSTEMI (non-ST elevated myocardial infarction) ([place]) 08/25/2014  . Osteoarthritis of right knee   . Pituitary tumor 2011   macroadenoma w/ VF  compromise-then gamma knife a few years leater for some recurrence  . Status post insertion of drug-eluting stent into left anterior descending (LAD) artery 08/28/15   + nuc study.   . Urinary retention   . Vitamin D deficiency 01/2008    Patient Active Problem List   Diagnosis Date Noted  . Abnormal stress test 07/29/2015  . Angina effort ([place])   . Abnormal nuclear stress test   . NSTEMI (non-ST elevated myocardial infarction) ([place]) 08/25/2014  . Basal cell carcinoma 07/15/2014  . Open wound of nose with complication [id number]  . Disease of upper respiratory system 07/15/2014  . Chronic infection of sinus 07/15/2014  . Dyssomnia 07/15/2014  . Dermatologic disease 04/05/2014  . CA of skin 04/05/2014  . Pituitary macroadenoma with extrasellar extension ([place]) 01/13/2014  . 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 Benign neoplasm of pituitary gland ([place]) 01/13/2014  . Mild cognitive disorder 01/13/2014  . PAC (premature atrial contraction) 12/10/2013  . APC (atrial premature contractions) 12/10/2013  . HYPERCHOLESTEROLEMIA  IIA 02/10/2009  . CAD, NATIVE VESSEL 02/10/2009  . Cardiomyopathy ([place]) 02/10/2009  . BRADYCARDIA 02/10/2009  . Cardiac conduction disorder 02/10/2009  . CAD in native artery 02/10/2009    Past Surgical History:  Procedure Laterality Date  . ARTHROSCOPIC SURGERY    . BACK SURGERY    . CARDIAC CATHETERIZATION  07/27/2015   Dr Aundra Dubin; oLAD 95%, mLAD 50%, CFX stent OK, OM1 60%, OM2 99%, pPLOM 50%, RCA irregular, AM2 70%, EF 55-60%, no regional wall motion abnormalities.     Marland Kitchen CARDIAC CATHETERIZATION N/A 07/28/2015   Procedure: Left Heart Cath and Coronary Angiography;  Surgeon: [name], MD;  Location: [facility] CV LAB;  Service: Cardiovascular;  Laterality: N/A;  . CARDIAC CATHETERIZATION N/A 07/28/2015   Procedure: Coronary Stent Intervention;  Surgeon: Troy Sine, MD;  Location: [facility];  Service:  Cardiovascular;  Laterality: N/A;  . CATARACT EXTRACTION    . CORONARY ANGIOPLASTY WITH STENT PLACEMENT  07/27/2015   XIENCE ALPINE RX 3.0X33 DES to the LAD, 95%>>0  . HAMMER  BUNION TOE SURGERY    . HAND SURGERY Left   . LEFT HEART CATHETERIZATION WITH CORONARY ANGIOGRAM N/A 08/25/2014   Procedure: LEFT HEART CATHETERIZATION WITH CORONARY ANGIOGRAM;  Surgeon: Burnell Blanks, MD;  Location: [facility] CATH LAB;  Service: Cardiovascular;  Laterality: N/A;  . PITUITARY SURGERY     12/01/09 Dr. Wilburn Cornelia and [name].       Home Medications    Prior to Admission medications   Medication Sig Start Date End Date Taking? Authorizing Provider  ALPRAZolam Duanne Moron) 0.5 MG tablet Take 0.5 mg by mouth as needed for anxiety.     Historical Provider, MD  aspirin 81 MG tablet Take 81 mg by mouth daily.      Historical Provider, MD  carvedilol (COREG) 3.125 MG tablet take 1/2 tablet by mouth at bedtime 10/04/16   [name], MD  Coenzyme Q10 (COQ10) 200 MG CAPS Take 1 capsule by mouth daily.      Historical Provider, MD  diazepam (VALIUM) 5 MG tablet Take 5 mg by mouth as needed (for MRI).     Historical Provider, MD  EPIPEN 2-PAK 0.3 MG/0.3ML SOAJ injection Inject 1 Dose as directed as needed. 05/20/14   Historical Provider, MD  ergocalciferol (VITAMIN D2) 50000 UNITS capsule Take 50,000 Units by mouth 2 (two) times a week.     Historical Provider, MD  Evolocumab (REPATHA) 140 MG/ML SOSY Inject into the skin every 14 (fourteen) days. 02/17/15   [name], MD  finasteride (PROSCAR) 5 MG tablet Take 5 mg by mouth daily.      Historical Provider, MD  hydrocortisone (CORTEF) 10 MG tablet Take 15 mg by mouth daily. One and on half tablet daily 01/09/11   Historical Provider, MD  levothyroxine (SYNTHROID, LEVOTHROID) 150 MCG tablet Take 150 mcg by mouth daily before breakfast.     Historical Provider, MD  nitroGLYCERIN (NITROSTAT) 0.4 MG SL tablet Place 1 tablet (0.4 mg total) under the tongue every 5  (five) minutes as needed for chest pain. 09/01/14   [name], MD  Nystatin [facility]) 100000 UNIT/GM POWD Apply 1 g topically daily as needed (yeast infection).  07/07/15   Historical Provider, MD  Omega-3 1000 MG CAPS Take 1 capsule (1,000 mg total) by mouth 2 (two) times daily. 06/26/16   [name], MD  Propylene Glycol (SYSTANE BALANCE OP) Apply 2 drops to eye daily as needed (dry eye).    Historical Provider, MD  Red Yeast Rice Extract 600 MG CAPS Take 600 mg by mouth daily. 07/15/14   Historical Provider, MD  tadalafil (CIALIS) 5 MG tablet Take 1 tablet (5 mg total) by mouth daily as needed for erectile dysfunction. Do not take nitro within 48 hrs after taking cialis 07/29/15  Evelene Croon Barrett, PA-C  testosterone cypionate (DEPOTESTOTERONE CYPIONATE) 200 MG/ML injection Inject into the muscle every 21 ( twenty-one) days. 1.62 % injection    Historical Provider, MD  ticagrelor (BRILINTA) 60 MG TABS tablet Take 1 tablet (60 mg total) by mouth 2 (two) times daily. 06/21/16   [name], MD  tolterodine (DETROL LA) 4 MG 24 hr capsule Take 4 mg by mouth daily. 06/13/16   Historical Provider, MD  VESICARE 10 MG tablet Take 5 mg by mouth daily.  12/29/14   Historical Provider, MD    Family History Family History  Problem Relation Age of Onset  . Congestive Heart Failure Father 67  . Other Mother 65  . Coronary artery disease Brother   . Other Sister 85    polio  . Heart attack Brother     MI/ASCAD 2006  . COPD Sister 10    Social History Social History  Substance Use Topics  . Smoking status: Never Smoker  . Smokeless tobacco: Never Used  . Alcohol use Yes     Comment: occas.     Allergies   Sulfamethoxazole; Bee venom; Sulfa antibiotics; Sulfonamide derivatives; and Tamsulosin   Review of Systems Review of Systems  All other systems reviewed and are negative.    Physical Exam Updated Vital Signs BP 146/77   Pulse 94   Resp 22   SpO2 98%   Physical Exam    Constitutional: No distress.  HENT:  Head: Normocephalic. Head is without raccoon's eyes and without Battle's sign.  Right Ear: External ear normal.  Left Ear: External ear normal.  Nose: Sinus tenderness present. No epistaxis.  Mouth/Throat: Uvula is midline. No oral lesions. No uvula swelling or lacerations.  Nasal swelling, small less than 1 cm lacerations on the nose, dried blood on the nose and around the mouth, no jaw malocclusion  Eyes: Conjunctivae are normal. Right eye exhibits no discharge. Left eye exhibits no discharge. No scleral icterus.  Neck: Neck supple. No tracheal deviation present.  Cardiovascular: Normal rate, regular rhythm and intact distal pulses.   Pulmonary/Chest: Effort normal and breath sounds normal. No stridor. No respiratory distress. He has no wheezes. He has no rales.  Abdominal: Soft. Bowel sounds are normal. He exhibits no distension. There is no tenderness. There is no rebound and no guarding.  Musculoskeletal: He exhibits no edema or tenderness.       Right shoulder: He exhibits no tenderness, no bony tenderness and no swelling.       Left shoulder: He exhibits no tenderness, no bony tenderness and no swelling.       Right wrist: He exhibits no tenderness, no bony tenderness and no swelling.       Left wrist: He exhibits no tenderness, no bony tenderness and no swelling.       Right hip: He exhibits normal range of motion, no tenderness, no bony tenderness and no swelling.       Left hip: He exhibits normal range of motion, no tenderness and no bony tenderness.       Right ankle: He exhibits no swelling. No tenderness.       Left ankle: He exhibits no swelling. No tenderness.       Cervical back: He exhibits no tenderness, no bony tenderness and no swelling.       Thoracic back: He exhibits no tenderness, no bony tenderness and no swelling.       Lumbar back: He exhibits no tenderness, no bony  tenderness and no swelling.  Neurological: He is alert. He  has normal strength. No cranial nerve deficit (no facial droop, extraocular movements intact, no slurred speech) or sensory deficit. He exhibits normal muscle tone. He displays no seizure activity.  Patient answers questions appropriately, equal grip strength and plantar flexion strength bilaterally, normal sensation  Skin: Skin is warm and dry. No rash noted. He is not diaphoretic.  Psychiatric: He has a normal mood and affect.  Nursing note and vitals reviewed.    ED Treatments / Results  Labs (all labs ordered are listed, but only abnormal results are displayed) Labs Reviewed  CBC WITH DIFFERENTIAL/PLATELET - Abnormal; Notable for the following:       Result Value   WBC 12.3 (*)    Neutro Abs 9.6 (*)    Monocytes Absolute 1.5 (*)    All other components within normal limits  BASIC METABOLIC PANEL - Abnormal; Notable for the following:    Glucose, Bld 102 (*)    Creatinine, Ser 1.35 (*)    GFR calc non Af Amer 48 (*)    GFR calc Af Amer 56 (*)    All other components within normal limits  URINALYSIS, ROUTINE W REFLEX MICROSCOPIC - Abnormal; Notable for the following:    Hgb urine dipstick SMALL (*)    All other components within normal limits  URINE CULTURE  PROTIME-INR    EKG  EKG Interpretation  Date/Time:  Sunday December 02 2016 09:40:51 EST Ventricular Rate:  75 PR Interval:    QRS Duration: 125 QT Interval:  388 QTC Calculation: 434 R Axis:   46 Text Interpretation:  Sinus rhythm IVCD, consider atypical RBBB No significant change since last tracing Confirmed by Arif Amendola  MD-J, Danitza Schoenfeldt ([id number]) on 12/02/2016 10:07:44 AM       Radiology Dg Chest 2 View  Result Date: 12/02/2016 CLINICAL DATA:  Syncope. EXAM: CHEST  2 VIEW COMPARISON:  Radiographs of July 10, 2016. FINDINGS: The heart size and mediastinal contours are within normal limits. Both lungs are clear. No pneumothorax or pleural effusion is noted. The visualized skeletal structures are unremarkable. IMPRESSION: No  active cardiopulmonary disease. Electronically Signed   By: Marijo Conception, M.D.   On: 12/02/2016 11:40   Ct Head Wo Contrast  Result Date: 12/02/2016 CLINICAL DATA:  Syncopal episode this morning. Fall. Facial injury. Initial encounter. EXAM: CT HEAD WITHOUT CONTRAST CT MAXILLOFACIAL WITHOUT CONTRAST CT CERVICAL SPINE WITHOUT CONTRAST TECHNIQUE: Multidetector CT imaging of the head, cervical spine, and maxillofacial structures were performed using the standard protocol without intravenous contrast. Multiplanar CT image reconstructions of the cervical spine and maxillofacial structures were also generated. COMPARISON:  Brain MRI 02/10/2014. Head CT 12/02/2009. Cervical spine radiographs 05/14/2007. FINDINGS: CT HEAD FINDINGS Brain: Mega cisterna magna is unchanged, a normal variant. There is mild cerebral atrophy. Patchy hypodensities in the subcortical and periventricular cerebral white matter bilaterally are nonspecific but compatible with mild chronic small vessel ischemic disease. There is no evidence of acute cortical infarct, intracranial hemorrhage, mass, midline shift, or extra-axial fluid collection. Postoperative changes are again noted related to prior trans-sphenoidal pituitary tumor resection, with small volume nodular tissue extending into the sphenoid sinus better evaluated on the prior MRI. Vascular: No hyperdense vessel or unexpected calcification. Skull: No skull fracture. Other: None. CT MAXILLOFACIAL FINDINGS Osseous: There is a nondisplaced nasal bone fracture anteriorly with minimal angulation and surrounding soft tissue swelling/hematoma. No additional maxillofacial fractures are identified. Orbits: Prior bilateral cataract extraction.  No orbital hematoma.  Sinuses: Postsurgical changes from trans-sphenoidal pituitary mass resection as above. Hypoplastic frontal sinuses. Minimal right ethmoid and right maxillary sinus mucosal thickening. Trace right mastoid effusion. Small nasal septal  perforation. Blood in the anterior nasal cavity, left greater than right. Soft tissues: Otherwise negative. CT CERVICAL SPINE FINDINGS Alignment: Cervical spine straightening. Minimal anterolisthesis of C3 on C4. Skull base and vertebrae: No evidence of acute fracture or destructive osseous process. Soft tissues and spinal canal: No prevertebral fluid or swelling. No visible canal hematoma. Disc levels: Diffuse cervical spondylosis. Severe disc space narrowing at C5-6 with milder narrowing at C4-5 and C6-7. Moderate diffuse facet arthrosis. Left facet ankylosis at C2-3. Mild calcified pannus posterior to the dens. Upper chest: Unremarkable. Other: Retropharyngeal course of the proximal right ICA. IMPRESSION: 1. No evidence of acute intracranial abnormality. 2. Mild chronic small vessel ischemic disease and postsurgical changes from pituitary adenoma resection as above. 3. Nondisplaced nasal bone fracture. 4. No evidence of acute cervical spine fracture or traumatic subluxation. Electronically Signed   By: Logan Bores M.D.   On: 12/02/2016 11:16   Ct Cervical Spine Wo Contrast  Result Date: 12/02/2016 CLINICAL DATA:  Syncopal episode this morning. Fall. Facial injury. Initial encounter. EXAM: CT HEAD WITHOUT CONTRAST CT MAXILLOFACIAL WITHOUT CONTRAST CT CERVICAL SPINE WITHOUT CONTRAST TECHNIQUE: Multidetector CT imaging of the head, cervical spine, and maxillofacial structures were performed using the standard protocol without intravenous contrast. Multiplanar CT image reconstructions of the cervical spine and maxillofacial structures were also generated. COMPARISON:  Brain MRI 02/10/2014. Head CT 12/02/2009. Cervical spine radiographs 05/14/2007. FINDINGS: CT HEAD FINDINGS Brain: Mega cisterna magna is unchanged, a normal variant. There is mild cerebral atrophy. Patchy hypodensities in the subcortical and periventricular cerebral white matter bilaterally are nonspecific but compatible with mild chronic small  vessel ischemic disease. There is no evidence of acute cortical infarct, intracranial hemorrhage, mass, midline shift, or extra-axial fluid collection. Postoperative changes are again noted related to prior trans-sphenoidal pituitary tumor resection, with small volume nodular tissue extending into the sphenoid sinus better evaluated on the prior MRI. Vascular: No hyperdense vessel or unexpected calcification. Skull: No skull fracture. Other: None. CT MAXILLOFACIAL FINDINGS Osseous: There is a nondisplaced nasal bone fracture anteriorly with minimal angulation and surrounding soft tissue swelling/hematoma. No additional maxillofacial fractures are identified. Orbits: Prior bilateral cataract extraction.  No orbital hematoma. Sinuses: Postsurgical changes from trans-sphenoidal pituitary mass resection as above. Hypoplastic frontal sinuses. Minimal right ethmoid and right maxillary sinus mucosal thickening. Trace right mastoid effusion. Small nasal septal perforation. Blood in the anterior nasal cavity, left greater than right. Soft tissues: Otherwise negative. CT CERVICAL SPINE FINDINGS Alignment: Cervical spine straightening. Minimal anterolisthesis of C3 on C4. Skull base and vertebrae: No evidence of acute fracture or destructive osseous process. Soft tissues and spinal canal: No prevertebral fluid or swelling. No visible canal hematoma. Disc levels: Diffuse cervical spondylosis. Severe disc space narrowing at C5-6 with milder narrowing at C4-5 and C6-7. Moderate diffuse facet arthrosis. Left facet ankylosis at C2-3. Mild calcified pannus posterior to the dens. Upper chest: Unremarkable. Other: Retropharyngeal course of the proximal right ICA. IMPRESSION: 1. No evidence of acute intracranial abnormality. 2. Mild chronic small vessel ischemic disease and postsurgical changes from pituitary adenoma resection as above. 3. Nondisplaced nasal bone fracture. 4. No evidence of acute cervical spine fracture or traumatic  subluxation. Electronically Signed   By: Logan Bores M.D.   On: 12/02/2016 11:16   Ct Maxillofacial Wo Contrast  Result Date: 12/02/2016 CLINICAL DATA:  Syncopal episode this morning. Fall. Facial injury. Initial encounter. EXAM: CT HEAD WITHOUT CONTRAST CT MAXILLOFACIAL WITHOUT CONTRAST CT CERVICAL SPINE WITHOUT CONTRAST TECHNIQUE: Multidetector CT imaging of the head, cervical spine, and maxillofacial structures were performed using the standard protocol without intravenous contrast. Multiplanar CT image reconstructions of the cervical spine and maxillofacial structures were also generated. COMPARISON:  Brain MRI 02/10/2014. Head CT 12/02/2009. Cervical spine radiographs 05/14/2007. FINDINGS: CT HEAD FINDINGS Brain: Mega cisterna magna is unchanged, a normal variant. There is mild cerebral atrophy. Patchy hypodensities in the subcortical and periventricular cerebral white matter bilaterally are nonspecific but compatible with mild chronic small vessel ischemic disease. There is no evidence of acute cortical infarct, intracranial hemorrhage, mass, midline shift, or extra-axial fluid collection. Postoperative changes are again noted related to prior trans-sphenoidal pituitary tumor resection, with small volume nodular tissue extending into the sphenoid sinus better evaluated on the prior MRI. Vascular: No hyperdense vessel or unexpected calcification. Skull: No skull fracture. Other: None. CT MAXILLOFACIAL FINDINGS Osseous: There is a nondisplaced nasal bone fracture anteriorly with minimal angulation and surrounding soft tissue swelling/hematoma. No additional maxillofacial fractures are identified. Orbits: Prior bilateral cataract extraction.  No orbital hematoma. Sinuses: Postsurgical changes from trans-sphenoidal pituitary mass resection as above. Hypoplastic frontal sinuses. Minimal right ethmoid and right maxillary sinus mucosal thickening. Trace right mastoid effusion. Small nasal septal perforation.  Blood in the anterior nasal cavity, left greater than right. Soft tissues: Otherwise negative. CT CERVICAL SPINE FINDINGS Alignment: Cervical spine straightening. Minimal anterolisthesis of C3 on C4. Skull base and vertebrae: No evidence of acute fracture or destructive osseous process. Soft tissues and spinal canal: No prevertebral fluid or swelling. No visible canal hematoma. Disc levels: Diffuse cervical spondylosis. Severe disc space narrowing at C5-6 with milder narrowing at C4-5 and C6-7. Moderate diffuse facet arthrosis. Left facet ankylosis at C2-3. Mild calcified pannus posterior to the dens. Upper chest: Unremarkable. Other: Retropharyngeal course of the proximal right ICA. IMPRESSION: 1. No evidence of acute intracranial abnormality. 2. Mild chronic small vessel ischemic disease and postsurgical changes from pituitary adenoma resection as above. 3. Nondisplaced nasal bone fracture. 4. No evidence of acute cervical spine fracture or traumatic subluxation. Electronically Signed   By: Logan Bores M.D.   On: 12/02/2016 11:16    Procedures Procedures (including critical care time)  Medications Ordered in ED Medications  0.9 %  sodium chloride infusion ( Intravenous New Bag/Given 12/02/16 1015)     Initial Impression / Assessment and Plan / ED Course  I have reviewed the triage vital signs and the nursing notes.  Pertinent labs & imaging results that were available during my care of the patient were reviewed by me and considered in my medical decision making (see chart for details).  Clinical Course as of Dec 03 1315  Sun Dec 02, 2016  1221 Wife is at the bedside and provided additional history.  Pt was actually having a lot of urinary frequency last night.   [JK]    Clinical Course User Index [JK] Dorie Rank, MD    Patient presents to the emergency room after a syncopal episode. Patient had symptoms of urinary frequency last evening. Urinalysis however does not suggest a urinary tract  infection. He does have an elevated white blood cell count and his laboratory tests do show renal insufficiency. Patient was given IV fluids. I have added on a urine culture. Plan on admission for observation, cardiac monitoring, further evaluation.  Final Clinical Impressions(s) / ED Diagnoses   Final  diagnoses:  Syncope, unspecified syncope type  Closed fracture of nasal bone, initial encounter      Dorie Rank, MD 12/02/16 1318

## 2016-12-02 NOTE — ED Notes (Signed)
Patient transported to CT 

## 2016-12-02 NOTE — Progress Notes (Signed)
Paged MD on call to notify that patient has had another Type 7, liquid stool. Asking MD to consider C diff testing. Waiting for possible orders.   Neveyah Garzon, RN

## 2016-12-02 NOTE — ED Notes (Signed)
Attempted report 

## 2016-12-02 NOTE — H&P (Signed)
[facility] History and Physical  [name] [place], [name] [id number] DOB: [birth year] DOA: 12/02/2016  Referring physician:  PCP: Jerlyn Ly, MD   Chief Complaint: "I fell."  HPI: [name], [name] is a 83 y.o. male  with past medical history significant for BPH, kidney cysts, memory loss, and STEMI, pituitary tumor, urinary retention, chronic low back pain who presents emergency room after multiple falls. Patient's wife was sick with a GI infection that was viral about a week ago. Patient was well until last night. Patient felt weak and fell in the bathroom. He did not hurt himself. And he does go back in the bed. Patient was in the restroom he try to urinate but was unable to. Patient has a history of BPH and normally gets up multiple times in the low 90s the bathroom. Patient tried to get up in the morning to use the bathroom again and was unable to. He fell and then lost control of bowel and bladder soiling himself. His wife heard him fall and activated EMS. EMS found the patient covered in feces and urine. Patient was brought to the emergency room for evaluation.  ED course: Patient given IV fluids. UA taken but no signs of infection. Hosp consult for admit.   Review of Systems:  As per HPI otherwise 10 point review of systems negative.    Past Medical History:  Diagnosis Date  . Atrial premature beats   . BPH (benign prostatic hyperplasia)   . BPH (benign prostatic hypertrophy)   . Cataract, right eye   . Chronic low back pain   . Colon polyp   . Coronary artery disease   . DDD (degenerative disc disease), lumbar   . Dermatitis 11/16  . Hyperlipidemia   . Insomnia   . Kidney cysts   . Memory loss 2015  . Non-ischemic cardiomyopathy ([place])   . NSTEMI (non-ST elevated myocardial infarction) ([place]) 08/25/2014  . Osteoarthritis of right knee   . Pituitary tumor 2011   macroadenoma w/ VF compromise-then gamma knife a few years leater for some recurrence  . Status post  insertion of drug-eluting stent into left anterior descending (LAD) artery 08/28/15   + nuc study.   . Urinary retention   . Vitamin D deficiency 01/2008   Past Surgical History:  Procedure Laterality Date  . ARTHROSCOPIC SURGERY    . BACK SURGERY    . CARDIAC CATHETERIZATION  07/27/2015   Dr Aundra Dubin; oLAD 95%, mLAD 50%, CFX stent OK, OM1 60%, OM2 99%, pPLOM 50%, RCA irregular, AM2 70%, EF 55-60%, no regional wall motion abnormalities.     Marland Kitchen CARDIAC CATHETERIZATION N/A 07/28/2015   Procedure: Left Heart Cath and Coronary Angiography;  Surgeon: [name], MD;  Location: [facility];  Service: Cardiovascular;  Laterality: N/A;  . CARDIAC CATHETERIZATION N/A 07/28/2015   Procedure: Coronary Stent Intervention;  Surgeon: Troy Sine, MD;  Location: [facility];  Service: Cardiovascular;  Laterality: N/A;  . CATARACT EXTRACTION    . CORONARY ANGIOPLASTY WITH STENT PLACEMENT  07/27/2015   XIENCE ALPINE RX 3.0X33 DES to the LAD, 95%>>0  . HAMMER  BUNION TOE SURGERY    . HAND SURGERY Left   . LEFT HEART CATHETERIZATION WITH CORONARY ANGIOGRAM N/A 08/25/2014   Procedure: LEFT HEART CATHETERIZATION WITH CORONARY ANGIOGRAM;  Surgeon: Burnell Blanks, MD;  Location[facility] C[facility];  Service: Cardiovascular;  Laterality: N/A;  . PITUITARY SURGERY     12/01/09 Dr. Wilburn Cornelia and Dr.  Elsner.   Social History:  reports that he has never smoked. He has never used smokeless tobacco. He reports that he drinks alcohol. He reports that he does not use drugs.  Allergies  Allergen Reactions  . Sulfamethoxazole Anaphylaxis and Swelling  . Bee Venom Rash  . Sulfa Antibiotics Nausea And Vomiting  . Sulfonamide Derivatives Nausea And Vomiting  . Tamsulosin     Unknown pt doesn't think he is allergic     Family History  Problem Relation Age of Onset  . Congestive Heart Failure Father 18  . Other Mother 56  . Coronary artery disease Brother   . Other Sister 58    polio  . Heart  attack Brother     MI/ASCAD 2006  . COPD Sister 37     Prior to Admission medications   Medication Sig Start Date End Date Taking? Authorizing Provider  ALPRAZolam Duanne Moron) 0.5 MG tablet Take 0.5 mg by mouth as needed for anxiety.    Yes Historical Provider, MD  aspirin 81 MG tablet Take 81 mg by mouth daily.     Yes Historical Provider, MD  carvedilol (COREG) 3.125 MG tablet take 1/2 tablet by mouth at bedtime 10/04/16  Yes [name], MD  Coenzyme Q10 (COQ10) 200 MG CAPS Take 1 capsule by mouth daily.     Yes Historical Provider, MD  ergocalciferol (VITAMIN D2) 50000 UNITS capsule Take 50,000 Units by mouth 2 (two) times a week.    Yes Historical Provider, MD  Evolocumab (REPATHA) 140 MG/ML SOSY Inject into the skin every 14 (fourteen) days. 02/17/15  Yes [name], MD  finasteride (PROSCAR) 5 MG tablet Take 5 mg by mouth daily.     Yes Historical Provider, MD  hydrocortisone (CORTEF) 10 MG tablet Take 15 mg by mouth daily. One and on half tablet daily 01/09/11  Yes Historical Provider, MD  levothyroxine (SYNTHROID, LEVOTHROID) 150 MCG tablet Take 150 mcg by mouth daily before breakfast.    Yes Historical Provider, MD  MYRBETRIQ 50 MG TB24 tablet Take 50 mg by mouth daily. 11/22/16  Yes Historical Provider, MD  nitroGLYCERIN (NITROSTAT) 0.4 MG SL tablet Place 1 tablet (0.4 mg total) under the tongue every 5 (five) minutes as needed for chest pain. 09/01/14  Yes [name], MD  Omega-3 1000 MG CAPS Take 1 capsule (1,000 mg total) by mouth 2 (two) times daily. 06/26/16  Yes [name], MD  omeprazole (PRILOSEC) 40 MG capsule Take 40 mg by mouth daily. 11/23/16  Yes Historical Provider, MD  Propylene Glycol (SYSTANE BALANCE OP) Apply 2 drops to eye daily as needed (dry eye).   Yes Historical Provider, MD  Red Yeast Rice Extract 600 MG CAPS Take 600 mg by mouth daily. 07/15/14  Yes Historical Provider, MD  tadalafil (CIALIS) 5 MG tablet Take 1 tablet (5 mg total) by mouth daily as  needed for erectile dysfunction. Do not take nitro within 48 hrs after taking cialis 07/29/15  Yes Rhonda G Barrett, PA-C  testosterone cypionate (DEPOTESTOTERONE CYPIONATE) 200 MG/ML injection Inject into the muscle every 21 ( twenty-one) days. 1.62 % injection   Yes Historical Provider, MD  ticagrelor (BRILINTA) 60 MG TABS tablet Take 1 tablet (60 mg total) by mouth 2 (two) times daily. 06/21/16  Yes [name], MD  tolterodine (DETROL LA) 4 MG 24 hr capsule Take 4 mg by mouth daily. 06/13/16  Yes Historical Provider, MD  VESICARE 10 MG tablet Take 5 mg by mouth  daily.  12/29/14  Yes Historical Provider, MD  EPIPEN 2-PAK 0.3 MG/0.3ML SOAJ injection Inject 1 Dose as directed as needed. 05/20/14   Historical Provider, MD   Physical Exam: Vitals:   12/02/16 1300 12/02/16 1400 12/02/16 1430 12/02/16 1500  BP: 133/67 150/79 139/71 130/73  Pulse: 74 71 71 82  Resp: 22 17 22 12   Temp:      TempSrc:      SpO2: 95% 99% 99% 99%    Wt Readings from Last 3 Encounters:  07/25/16 104.3 kg (230 lb)  09/19/15 108.8 kg (239 lb 12.8 oz)  08/11/15 106.1 kg (234 lb)    General:  Appears calm and comfortable, And well-appearing, alert and oriented 3 Eyes:  PERRL, EOMI, normal lids, iris ENT:  grossly normal hearing, lips & tongue Neck:  no LAD, masses or thyromegaly Cardiovascular:  RRR, no m/r/g. No LE edema.  Respiratory:  CTA bilaterally, no w/r/r. Normal respiratory effort. Abdomen:  soft, ntnd Skin:  no rash or induration seen on limited exam. Nasal lac Musculoskeletal:  grossly normal tone BUE/BLE Psychiatric:  grossly normal mood and affect, speech fluent and appropriate Neurologic:  CN 2-12 grossly intact, moves all extremities in coordinated fashion.          Labs on Admission:  Basic Metabolic Panel:  Recent Labs Lab 12/02/16 1012  NA 135  K 4.3  CL 102  CO2 25  GLUCOSE 102*  BUN 20  CREATININE 1.35*  CALCIUM 9.1   Liver Function Tests: No results for input(s): AST,  ALT, ALKPHOS, BILITOT, PROT, ALBUMIN in the last 168 hours. No results for input(s): LIPASE, AMYLASE in the last 168 hours. No results for input(s): AMMONIA in the last 168 hours. CBC:  Recent Labs Lab 12/02/16 1012  WBC 12.3*  NEUTROABS 9.6*  HGB 15.0  HCT 43.4  MCV 90.2  PLT 195   Cardiac Enzymes: No results for input(s): CKTOTAL, CKMB, CKMBINDEX, TROPONINI in the last 168 hours.  BNP (last 3 results) No results for input(s): BNP in the last 8760 hours.  ProBNP (last 3 results) No results for input(s): PROBNP in the last 8760 hours.   Creatinine clearance cannot be calculated (Unknown ideal weight.)  CBG: No results for input(s): GLUCAP in the last 168 hours.  Radiological Exams on Admission: Dg Chest 2 View  Result Date: 12/02/2016 CLINICAL DATA:  Syncope. EXAM: CHEST  2 VIEW COMPARISON:  Radiographs of July 10, 2016. FINDINGS: The heart size and mediastinal contours are within normal limits. Both lungs are clear. No pneumothorax or pleural effusion is noted. The visualized skeletal structures are unremarkable. IMPRESSION: No active cardiopulmonary disease. Electronically Signed   By: Marijo Conception, M.D.   On: 12/02/2016 11:40   Ct Head Wo Contrast  Result Date: 12/02/2016 CLINICAL DATA:  Syncopal episode this morning. Fall. Facial injury. Initial encounter. EXAM: CT HEAD WITHOUT CONTRAST CT MAXILLOFACIAL WITHOUT CONTRAST CT CERVICAL SPINE WITHOUT CONTRAST TECHNIQUE: Multidetector CT imaging of the head, cervical spine, and maxillofacial structures were performed using the standard protocol without intravenous contrast. Multiplanar CT image reconstructions of the cervical spine and maxillofacial structures were also generated. COMPARISON:  Brain MRI 02/10/2014. Head CT 12/02/2009. Cervical spine radiographs 05/14/2007. FINDINGS: CT HEAD FINDINGS Brain: Mega cisterna magna is unchanged, a normal variant. There is mild cerebral atrophy. Patchy hypodensities in the subcortical  and periventricular cerebral white matter bilaterally are nonspecific but compatible with mild chronic small vessel ischemic disease. There is no evidence of acute cortical infarct, intracranial  hemorrhage, mass, midline shift, or extra-axial fluid collection. Postoperative changes are again noted related to prior trans-sphenoidal pituitary tumor resection, with small volume nodular tissue extending into the sphenoid sinus better evaluated on the prior MRI. Vascular: No hyperdense vessel or unexpected calcification. Skull: No skull fracture. Other: None. CT MAXILLOFACIAL FINDINGS Osseous: There is a nondisplaced nasal bone fracture anteriorly with minimal angulation and surrounding soft tissue swelling/hematoma. No additional maxillofacial fractures are identified. Orbits: Prior bilateral cataract extraction.  No orbital hematoma. Sinuses: Postsurgical changes from trans-sphenoidal pituitary mass resection as above. Hypoplastic frontal sinuses. Minimal right ethmoid and right maxillary sinus mucosal thickening. Trace right mastoid effusion. Small nasal septal perforation. Blood in the anterior nasal cavity, left greater than right. Soft tissues: Otherwise negative. CT CERVICAL SPINE FINDINGS Alignment: Cervical spine straightening. Minimal anterolisthesis of C3 on C4. Skull base and vertebrae: No evidence of acute fracture or destructive osseous process. Soft tissues and spinal canal: No prevertebral fluid or swelling. No visible canal hematoma. Disc levels: Diffuse cervical spondylosis. Severe disc space narrowing at C5-6 with milder narrowing at C4-5 and C6-7. Moderate diffuse facet arthrosis. Left facet ankylosis at C2-3. Mild calcified pannus posterior to the dens. Upper chest: Unremarkable. Other: Retropharyngeal course of the proximal right ICA. IMPRESSION: 1. No evidence of acute intracranial abnormality. 2. Mild chronic small vessel ischemic disease and postsurgical changes from pituitary adenoma resection  as above. 3. Nondisplaced nasal bone fracture. 4. No evidence of acute cervical spine fracture or traumatic subluxation. Electronically Signed   By: Logan Bores M.D.   On: 12/02/2016 11:16   Ct Cervical Spine Wo Contrast  Result Date: 12/02/2016 CLINICAL DATA:  Syncopal episode this morning. Fall. Facial injury. Initial encounter. EXAM: CT HEAD WITHOUT CONTRAST CT MAXILLOFACIAL WITHOUT CONTRAST CT CERVICAL SPINE WITHOUT CONTRAST TECHNIQUE: Multidetector CT imaging of the head, cervical spine, and maxillofacial structures were performed using the standard protocol without intravenous contrast. Multiplanar CT image reconstructions of the cervical spine and maxillofacial structures were also generated. COMPARISON:  Brain MRI 02/10/2014. Head CT 12/02/2009. Cervical spine radiographs 05/14/2007. FINDINGS: CT HEAD FINDINGS Brain: Mega cisterna magna is unchanged, a normal variant. There is mild cerebral atrophy. Patchy hypodensities in the subcortical and periventricular cerebral white matter bilaterally are nonspecific but compatible with mild chronic small vessel ischemic disease. There is no evidence of acute cortical infarct, intracranial hemorrhage, mass, midline shift, or extra-axial fluid collection. Postoperative changes are again noted related to prior trans-sphenoidal pituitary tumor resection, with small volume nodular tissue extending into the sphenoid sinus better evaluated on the prior MRI. Vascular: No hyperdense vessel or unexpected calcification. Skull: No skull fracture. Other: None. CT MAXILLOFACIAL FINDINGS Osseous: There is a nondisplaced nasal bone fracture anteriorly with minimal angulation and surrounding soft tissue swelling/hematoma. No additional maxillofacial fractures are identified. Orbits: Prior bilateral cataract extraction.  No orbital hematoma. Sinuses: Postsurgical changes from trans-sphenoidal pituitary mass resection as above. Hypoplastic frontal sinuses. Minimal right ethmoid and  right maxillary sinus mucosal thickening. Trace right mastoid effusion. Small nasal septal perforation. Blood in the anterior nasal cavity, left greater than right. Soft tissues: Otherwise negative. CT CERVICAL SPINE FINDINGS Alignment: Cervical spine straightening. Minimal anterolisthesis of C3 on C4. Skull base and vertebrae: No evidence of acute fracture or destructive osseous process. Soft tissues and spinal canal: No prevertebral fluid or swelling. No visible canal hematoma. Disc levels: Diffuse cervical spondylosis. Severe disc space narrowing at C5-6 with milder narrowing at C4-5 and C6-7. Moderate diffuse facet arthrosis. Left facet ankylosis at C2-3. Mild  calcified pannus posterior to the dens. Upper chest: Unremarkable. Other: Retropharyngeal course of the proximal right ICA. IMPRESSION: 1. No evidence of acute intracranial abnormality. 2. Mild chronic small vessel ischemic disease and postsurgical changes from pituitary adenoma resection as above. 3. Nondisplaced nasal bone fracture. 4. No evidence of acute cervical spine fracture or traumatic subluxation. Electronically Signed   By: Logan Bores M.D.   On: 12/02/2016 11:16   Ct Maxillofacial Wo Contrast  Result Date: 12/02/2016 CLINICAL DATA:  Syncopal episode this morning. Fall. Facial injury. Initial encounter. EXAM: CT HEAD WITHOUT CONTRAST CT MAXILLOFACIAL WITHOUT CONTRAST CT CERVICAL SPINE WITHOUT CONTRAST TECHNIQUE: Multidetector CT imaging of the head, cervical spine, and maxillofacial structures were performed using the standard protocol without intravenous contrast. Multiplanar CT image reconstructions of the cervical spine and maxillofacial structures were also generated. COMPARISON:  Brain MRI 02/10/2014. Head CT 12/02/2009. Cervical spine radiographs 05/14/2007. FINDINGS: CT HEAD FINDINGS Brain: Mega cisterna magna is unchanged, a normal variant. There is mild cerebral atrophy. Patchy hypodensities in the subcortical and periventricular  cerebral white matter bilaterally are nonspecific but compatible with mild chronic small vessel ischemic disease. There is no evidence of acute cortical infarct, intracranial hemorrhage, mass, midline shift, or extra-axial fluid collection. Postoperative changes are again noted related to prior trans-sphenoidal pituitary tumor resection, with small volume nodular tissue extending into the sphenoid sinus better evaluated on the prior MRI. Vascular: No hyperdense vessel or unexpected calcification. Skull: No skull fracture. Other: None. CT MAXILLOFACIAL FINDINGS Osseous: There is a nondisplaced nasal bone fracture anteriorly with minimal angulation and surrounding soft tissue swelling/hematoma. No additional maxillofacial fractures are identified. Orbits: Prior bilateral cataract extraction.  No orbital hematoma. Sinuses: Postsurgical changes from trans-sphenoidal pituitary mass resection as above. Hypoplastic frontal sinuses. Minimal right ethmoid and right maxillary sinus mucosal thickening. Trace right mastoid effusion. Small nasal septal perforation. Blood in the anterior nasal cavity, left greater than right. Soft tissues: Otherwise negative. CT CERVICAL SPINE FINDINGS Alignment: Cervical spine straightening. Minimal anterolisthesis of C3 on C4. Skull base and vertebrae: No evidence of acute fracture or destructive osseous process. Soft tissues and spinal canal: No prevertebral fluid or swelling. No visible canal hematoma. Disc levels: Diffuse cervical spondylosis. Severe disc space narrowing at C5-6 with milder narrowing at C4-5 and C6-7. Moderate diffuse facet arthrosis. Left facet ankylosis at C2-3. Mild calcified pannus posterior to the dens. Upper chest: Unremarkable. Other: Retropharyngeal course of the proximal right ICA. IMPRESSION: 1. No evidence of acute intracranial abnormality. 2. Mild chronic small vessel ischemic disease and postsurgical changes from pituitary adenoma resection as above. 3.  Nondisplaced nasal bone fracture. 4. No evidence of acute cervical spine fracture or traumatic subluxation. Electronically Signed   By: Logan Bores M.D.   On: 12/02/2016 11:16    EKG: Independently reviewed. EKG: Ventricular rate 75 PR interval 165 425 QT 388; normal sinus rhythm, incomplete right bundle branch block;no acute st chnages when compared to 07/2016 EKG, bradycardia resolved   Assessment/Plan Principal Problem:   Syncope Active Problems:   HYPERCHOLESTEROLEMIA  IIA   Cardiomyopathy ([place])   Falls   Diarrhea   Nasal fracture   Syncope - serial trop ordered, initial neg - prn EKG CP - echo ordered for AM - tele bed, cardiac monitoring - ambien for sleep prn - zofran prn for nausea  Diarrhea, likely viral Wife had similar symptoms Numerous antibiotics CT abdomen and pelvis ordered without contrast due to renal function  Falls Likely secondary to weakness from dehydration due to  diarrhea PT consult  Nasal fracture Nothing to do, nondisplaced  Laceration Repair by EDP Tetanus UTD  Hyperlipidemia Continue statin, omega 3  Overactive bladder/BPH Continue Myrbetriq, Detrol-->Toviaz, vesicare-->enablex, Proscar  GERD PPI  CAD Cont bilnta, ntg sl, asa 81mg   Anxiety Cont xanax  Cardiomyopathy Continue Coreg  Hypothyroidism Cont OP synthroid 125 mcg qd No signs of hyper or hypothyroidism   HOLDING - [place]atha  Code Status: FULL  DVT Prophylaxis: SCDs Family Communication: wife at bedside Disposition Plan: Pending Improvement  Status: obs, tele  Elwin Mocha, MD Family Medicine [facility] [email or web address] Password [email or web address]

## 2016-12-02 NOTE — Progress Notes (Signed)
Text paged Dr. Aggie Moats x2, with no response, to notify that pt has had 3 (Type 7) BM today.  Asking for MD tp consider order for C-diff testing..  Waiting for response.

## 2016-12-03 ENCOUNTER — Observation Stay (HOSPITAL_BASED_OUTPATIENT_CLINIC_OR_DEPARTMENT_OTHER): Payer: Medicare Other

## 2016-12-03 ENCOUNTER — Encounter (HOSPITAL_COMMUNITY): Payer: Self-pay

## 2016-12-03 DIAGNOSIS — G8929 Other chronic pain: Secondary | ICD-10-CM | POA: Diagnosis present

## 2016-12-03 DIAGNOSIS — E785 Hyperlipidemia, unspecified: Secondary | ICD-10-CM | POA: Diagnosis not present

## 2016-12-03 DIAGNOSIS — N179 Acute kidney failure, unspecified: Secondary | ICD-10-CM | POA: Diagnosis not present

## 2016-12-03 DIAGNOSIS — E871 Hypo-osmolality and hyponatremia: Secondary | ICD-10-CM | POA: Diagnosis not present

## 2016-12-03 DIAGNOSIS — R338 Other retention of urine: Secondary | ICD-10-CM | POA: Diagnosis not present

## 2016-12-03 DIAGNOSIS — R55 Syncope and collapse: Secondary | ICD-10-CM | POA: Diagnosis not present

## 2016-12-03 DIAGNOSIS — S022XXD Fracture of nasal bones, subsequent encounter for fracture with routine healing: Secondary | ICD-10-CM | POA: Diagnosis not present

## 2016-12-03 DIAGNOSIS — I951 Orthostatic hypotension: Secondary | ICD-10-CM | POA: Diagnosis not present

## 2016-12-03 DIAGNOSIS — E2749 Other adrenocortical insufficiency: Secondary | ICD-10-CM | POA: Diagnosis not present

## 2016-12-03 DIAGNOSIS — M545 Low back pain: Secondary | ICD-10-CM | POA: Diagnosis not present

## 2016-12-03 DIAGNOSIS — W19XXXD Unspecified fall, subsequent encounter: Secondary | ICD-10-CM | POA: Diagnosis not present

## 2016-12-03 DIAGNOSIS — N390 Urinary tract infection, site not specified: Secondary | ICD-10-CM | POA: Diagnosis not present

## 2016-12-03 DIAGNOSIS — W01198A Fall on same level from slipping, tripping and stumbling with subsequent striking against other object, initial encounter: Secondary | ICD-10-CM | POA: Diagnosis present

## 2016-12-03 DIAGNOSIS — E784 Other hyperlipidemia: Secondary | ICD-10-CM | POA: Diagnosis not present

## 2016-12-03 DIAGNOSIS — Z87898 Personal history of other specified conditions: Secondary | ICD-10-CM

## 2016-12-03 DIAGNOSIS — I5042 Chronic combined systolic (congestive) and diastolic (congestive) heart failure: Secondary | ICD-10-CM | POA: Diagnosis not present

## 2016-12-03 DIAGNOSIS — A0839 Other viral enteritis: Secondary | ICD-10-CM | POA: Diagnosis not present

## 2016-12-03 DIAGNOSIS — S0121XA Laceration without foreign body of nose, initial encounter: Secondary | ICD-10-CM | POA: Diagnosis present

## 2016-12-03 DIAGNOSIS — R040 Epistaxis: Secondary | ICD-10-CM | POA: Diagnosis not present

## 2016-12-03 DIAGNOSIS — E7800 Pure hypercholesterolemia, unspecified: Secondary | ICD-10-CM | POA: Diagnosis not present

## 2016-12-03 DIAGNOSIS — A084 Viral intestinal infection, unspecified: Secondary | ICD-10-CM | POA: Diagnosis not present

## 2016-12-03 DIAGNOSIS — I4510 Unspecified right bundle-branch block: Secondary | ICD-10-CM

## 2016-12-03 DIAGNOSIS — N401 Enlarged prostate with lower urinary tract symptoms: Secondary | ICD-10-CM | POA: Diagnosis not present

## 2016-12-03 DIAGNOSIS — E272 Addisonian crisis: Secondary | ICD-10-CM | POA: Diagnosis not present

## 2016-12-03 DIAGNOSIS — E2740 Unspecified adrenocortical insufficiency: Secondary | ICD-10-CM | POA: Diagnosis not present

## 2016-12-03 DIAGNOSIS — N3281 Overactive bladder: Secondary | ICD-10-CM | POA: Diagnosis not present

## 2016-12-03 DIAGNOSIS — G4700 Insomnia, unspecified: Secondary | ICD-10-CM | POA: Diagnosis present

## 2016-12-03 DIAGNOSIS — I2510 Atherosclerotic heart disease of native coronary artery without angina pectoris: Secondary | ICD-10-CM | POA: Diagnosis not present

## 2016-12-03 DIAGNOSIS — D72829 Elevated white blood cell count, unspecified: Secondary | ICD-10-CM | POA: Diagnosis not present

## 2016-12-03 DIAGNOSIS — S022XXA Fracture of nasal bones, initial encounter for closed fracture: Secondary | ICD-10-CM | POA: Diagnosis present

## 2016-12-03 DIAGNOSIS — F419 Anxiety disorder, unspecified: Secondary | ICD-10-CM | POA: Diagnosis not present

## 2016-12-03 DIAGNOSIS — G3184 Mild cognitive impairment, so stated: Secondary | ICD-10-CM | POA: Diagnosis not present

## 2016-12-03 DIAGNOSIS — I252 Old myocardial infarction: Secondary | ICD-10-CM

## 2016-12-03 DIAGNOSIS — K219 Gastro-esophageal reflux disease without esophagitis: Secondary | ICD-10-CM | POA: Diagnosis present

## 2016-12-03 DIAGNOSIS — Z6830 Body mass index (BMI) 30.0-30.9, adult: Secondary | ICD-10-CM | POA: Diagnosis not present

## 2016-12-03 DIAGNOSIS — I4519 Other right bundle-branch block: Secondary | ICD-10-CM | POA: Diagnosis present

## 2016-12-03 DIAGNOSIS — I255 Ischemic cardiomyopathy: Secondary | ICD-10-CM | POA: Diagnosis not present

## 2016-12-03 DIAGNOSIS — A09 Infectious gastroenteritis and colitis, unspecified: Secondary | ICD-10-CM | POA: Diagnosis not present

## 2016-12-03 DIAGNOSIS — M1711 Unilateral primary osteoarthritis, right knee: Secondary | ICD-10-CM | POA: Diagnosis present

## 2016-12-03 DIAGNOSIS — I429 Cardiomyopathy, unspecified: Secondary | ICD-10-CM | POA: Diagnosis not present

## 2016-12-03 DIAGNOSIS — E860 Dehydration: Secondary | ICD-10-CM | POA: Diagnosis not present

## 2016-12-03 DIAGNOSIS — I491 Atrial premature depolarization: Secondary | ICD-10-CM | POA: Diagnosis not present

## 2016-12-03 LAB — BASIC METABOLIC PANEL
Anion gap: 7 (ref 5–15)
BUN: 17 mg/dL (ref 6–20)
CO2: 22 mmol/L (ref 22–32)
CREATININE: 1.17 mg/dL (ref 0.61–1.24)
Calcium: 8.7 mg/dL — ABNORMAL LOW (ref 8.9–10.3)
Chloride: 105 mmol/L (ref 101–111)
GFR calc Af Amer: >60 mL/min (ref >60)
GFR calc non Af Amer: 57 mL/min — ABNORMAL LOW (ref >60)
GLUCOSE: 90 mg/dL (ref 65–99)
Potassium: 4 mmol/L (ref 3.5–5.1)
Sodium: 134 mmol/L — ABNORMAL LOW (ref 135–145)

## 2016-12-03 LAB — URINE CULTURE

## 2016-12-03 LAB — ECHOCARDIOGRAM COMPLETE
Height: 76 in
WEIGHTICAEL: 3305.14 [oz_av]

## 2016-12-03 LAB — CBC
HCT: 44.6 % (ref 39.0–52.0)
Hemoglobin: 15.3 g/dL (ref 13.0–17.0)
MCH: 30.9 pg (ref 26.0–34.0)
MCHC: 34.3 g/dL (ref 30.0–36.0)
MCV: 90.1 fL (ref 78.0–100.0)
Platelets: 176 10*3/uL (ref 150–400)
RBC: 4.95 MIL/uL (ref 4.22–5.81)
RDW: 13.6 % (ref 11.5–15.5)
WBC: 13.8 10*3/uL — ABNORMAL HIGH (ref 4.0–10.5)

## 2016-12-03 LAB — C DIFFICILE QUICK SCREEN W PCR REFLEX
C Diff antigen: NEGATIVE
C Diff interpretation: NOT DETECTED
C Diff toxin: NEGATIVE

## 2016-12-03 MED ORDER — SODIUM CHLORIDE 0.9 % IV SOLN
INTRAVENOUS | Status: DC
  Administered 2016-12-03 (×2): via INTRAVENOUS
  Administered 2016-12-04: 100 mL via INTRAVENOUS

## 2016-12-03 MED ORDER — OMEGA-3-ACID ETHYL ESTERS 1 G PO CAPS
1.0000 g | ORAL_CAPSULE | Freq: Two times a day (BID) | ORAL | Status: DC
  Administered 2016-12-03 – 2016-12-06 (×7): 1 g via ORAL
  Filled 2016-12-03 (×7): qty 1

## 2016-12-03 MED ORDER — HYDROCORTISONE NA SUCCINATE PF 100 MG IJ SOLR
100.0000 mg | Freq: Three times a day (TID) | INTRAMUSCULAR | Status: DC
  Administered 2016-12-03 – 2016-12-04 (×3): 100 mg via INTRAVENOUS
  Filled 2016-12-03 (×3): qty 2

## 2016-12-03 MED ORDER — SACCHAROMYCES BOULARDII 250 MG PO CAPS
250.0000 mg | ORAL_CAPSULE | Freq: Two times a day (BID) | ORAL | Status: DC
  Administered 2016-12-03 – 2016-12-06 (×7): 250 mg via ORAL
  Filled 2016-12-03 (×7): qty 1

## 2016-12-03 MED ORDER — SALINE SPRAY 0.65 % NA SOLN
1.0000 | NASAL | Status: DC | PRN
  Filled 2016-12-03: qty 44

## 2016-12-03 MED ORDER — LOPERAMIDE HCL 2 MG PO CAPS
2.0000 mg | ORAL_CAPSULE | ORAL | Status: DC | PRN
  Administered 2016-12-03: 2 mg via ORAL
  Filled 2016-12-03: qty 1

## 2016-12-03 MED ORDER — SODIUM CHLORIDE 0.9 % IV BOLUS (SEPSIS): 1000.0000 mL | Freq: Once | INTRAVENOUS | Status: DC

## 2016-12-03 MED ORDER — PERFLUTREN LIPID MICROSPHERE
1.0000 mL | INTRAVENOUS | Status: AC | PRN
  Administered 2016-12-03: 2 mL via INTRAVENOUS
  Filled 2016-12-03: qty 10

## 2016-12-03 MED ORDER — SODIUM CHLORIDE 0.9 % IV BOLUS (SEPSIS)
500.0000 mL | Freq: Once | INTRAVENOUS | Status: AC
  Administered 2016-12-03: 500 mL via INTRAVENOUS

## 2016-12-03 NOTE — Progress Notes (Signed)
Pt on enteric precaution, stool sample sent, test pending.  Will continue to monitor.   Khang Hannum, RN

## 2016-12-03 NOTE — Progress Notes (Addendum)
[facility] Progress Note  Patient: [name], [name] [id number]   PCP: Jerlyn Ly, MD DOB: [birth year]   DOA: 12/02/2016   DOS: 12/03/2016   Date of Service: the patient was seen and examined on 12/03/2016  Subjective: Feeling better, no acute complaint, that he has still present. No abdominal pain no nausea no vomiting.  Brief hospital course: Pt. with PMH of pituitary tumor S/P resection now on chronic steroids, mild cognitive impairment, overactive bladder and BPH, CAD and ischemic cardiomyopathy; admitted on 12/02/2016, with complaint of fall followed by episodes of diarrhea, was found to have acute kidney injury as well as profound orthostasis. Currently further plan is to aggressively hydrate the patient.  Assessment and Plan: 1. Syncope Secondary to volume overload was. Orthostatic hypotension.  blood pressure drops from [id number] systolic on supine to 58 systolic on standing. Unsafe to be discharged home given this blood pressure.  No significant abnormality on telemetry, echocardiogram unremarkable. As well. EKG unremarkable. Patient is asymptomatic and no neurological deficit. Diarrhea currently present. C. difficile PCR negative. We will use Imodium, give bolus normal saline 500 mL as well as continue IV hydration. Also add stress dose steroids at present. PTOT consulted who recommends CIR, CIR consulted.   2. Coronary artery disease Chronic systolic and diastolic CHF, ischemic cardio myopathy. EF now normalized, persistent diastolic dysfunction. Continue aspirin, Brilinta. Hold home antihypertensive medication.  3. Recurrent fall. Presented with recurrent fall, also has nasal fracture. No evidence of sinus involvement. Continue close monitoring, will use when necessary Ocean Spray as well as Afrin spray.  4. Diarrhea. Probably viral. C. difficile PCR negative. Use Imodium as well as probiotics.  5. Overactive bladder. Continuing home medication. Bladder scan  .  Bowel regimen: last BM 12/03/2016 Diet: Cardiac diet DVT Prophylaxis: subcutaneous Heparin  Advance goals of care discussion: full code  Family Communication: family was present at bedside, at the time of interview. The pt provided permission to discuss medical plan with the family. Opportunity was given to ask question and all questions were answered satisfactorily.   Disposition:  To be decided pending improvement  Consultants: NONE Procedures: Echocardiogram  Antibiotics: Anti-infectives    None       Objective: Physical Exam: Vitals:   12/02/16 2132 12/03/16 0010 12/03/16 0715 12/03/16 1347  BP: 120/61 120/64 (!) 118/54 94/61  Pulse: 70 67 60 90  Resp: 18 18  18   Temp: 100.1 F (37.8 C) 98.6 F (37 C) 97.9 F (36.6 C) 97.8 F (36.6 C)  TempSrc: Oral Oral Oral Oral  SpO2: 97% 98% 97% 94%  Weight:   93.7 kg (206 lb 9.1 oz)   Height:        Intake/Output Summary (Last 24 hours) at 12/03/16 1358 Last data filed at 12/03/16 0641  Gross per 24 hour  Intake          1574.42 ml  Output              400 ml  Net          1174.42 ml   Filed Weights   12/02/16 1847 12/03/16 0715  Weight: 92 kg (202 lb 14.4 oz) 93.7 kg (206 lb 9.1 oz)   General: Alert, Awake and Oriented to Time, Place and Person. Appear in mild distress, affect appropriate Eyes: PERRL, Conjunctiva normal ENT: Oral Mucosa clear moist. Mild nasal swelling with clotted blood, no deformities Neck: no JVD, no Abnormal Mass Or lumps Cardiovascular: S1 and S2 Present, no Murmur, Respiratory: Bilateral  Air entry equal and Decreased, no use of accessory muscle, Clear to Auscultation, no Crackles, no wheezes Abdomen: Bowel Sound present, Soft and no tenderness Skin: no redness, no Rash, no induration Extremities: no Pedal edema, no calf tenderness Neurologic: Grossly no focal neuro deficit. Bilaterally Equal motor strength  Data Reviewed: CBC:  Recent Labs Lab 12/02/16 1012 12/03/16 0524  WBC  12.3* 13.8*  NEUTROABS 9.6*  --   HGB 15.0 15.3  HCT 43.4 44.6  MCV 90.2 90.1  PLT 195 0000000   Basic Metabolic Panel:  Recent Labs Lab 12/02/16 1012 12/03/16 0524  NA 135 134*  K 4.3 4.0  CL 102 105  CO2 25 22  GLUCOSE 102* 90  BUN 20 17  CREATININE 1.35* 1.17  CALCIUM 9.1 8.7*    Liver Function Tests: No results for input(s): AST, ALT, ALKPHOS, BILITOT, PROT, ALBUMIN in the last 168 hours. No results for input(s): LIPASE, AMYLASE in the last 168 hours. No results for input(s): AMMONIA in the last 168 hours. Coagulation Profile:  Recent Labs Lab 12/02/16 1012  INR 1.19   Cardiac Enzymes:  Recent Labs Lab 12/02/16 1813  TROPONINI <0.03   BNP (last 3 results) No results for input(s): PROBNP in the last 8760 hours. CBG: No results for input(s): GLUCAP in the last 168 hours. Studies: Ct Abdomen Pelvis Wo Contrast  Result Date: 12/02/2016 CLINICAL DATA:  Fall, incontinence. EXAM: CT ABDOMEN AND PELVIS WITHOUT CONTRAST TECHNIQUE: Multidetector CT imaging of the abdomen and pelvis was performed following the standard protocol without IV contrast. COMPARISON:  None. FINDINGS: Lower chest: No acute abnormality. Hepatobiliary: Layering stones within the otherwise normal-appearing gallbladder. No focal abnormality identified within the liver. No bile duct dilatation. Pancreas: Unremarkable. No pancreatic ductal dilatation or surrounding inflammatory changes. Spleen: Normal in size without focal abnormality. Adrenals/Urinary Tract: Adrenal glands appear normal. Large right renal cyst. Small left renal cyst. No renal stone or hydronephrosis bilaterally. No ureteral or bladder calculi identified. Stomach/Bowel: Bowel is normal in caliber. No bowel wall thickening or evidence of bowel wall inflammation seen. Fluid is present within the large and small bowel compatible with history of diarrhea. Vascular/Lymphatic: Aortic atherosclerosis. No enlarged lymph nodes seen. Reproductive:  Unremarkable. Other: No free fluid or abscess collection. No free intraperitoneal air. Musculoskeletal: Degenerative changes throughout the thoracolumbar spine, at least moderate in degree. No acute or suspicious osseous finding. IMPRESSION: 1. Fluid throughout the majority of the nondistended large bowel, compatible with history of diarrhea. No evidence of focal bowel wall thickening or inflammation. I cannot exclude a mild diffuse colitis. 2. Cholelithiasis without evidence of acute cholecystitis. 3. Aortic atherosclerosis. 4. Additional chronic/incidental findings detailed above. Electronically Signed   By: Franki Cabot M.D.   On: 12/02/2016 18:25    Scheduled Meds: . aspirin  81 mg Oral Daily  . darifenacin  15 mg Oral Daily  . fesoterodine  8 mg Oral Daily  . finasteride  5 mg Oral Daily  . hydrocortisone sod succinate (SOLU-CORTEF) inj  100 mg Intravenous Q8H  . levothyroxine  150 mcg Oral QAC breakfast  . mirabegron ER  50 mg Oral Daily  . omega-3 acid ethyl esters  1 g Oral BID  . pantoprazole  80 mg Oral Daily  . saccharomyces boulardii  250 mg Oral BID  . sodium chloride flush  3 mL Intravenous Q12H  . ticagrelor  60 mg Oral BID   Continuous Infusions: . sodium chloride 75 mL/hr at 12/03/16 1315   PRN Meds: acetaminophen **OR**  acetaminophen, ALPRAZolam, loperamide, nitroGLYCERIN, ondansetron **OR** ondansetron (ZOFRAN) IV, perflutren lipid microspheres (DEFINITY) IV suspension, polyethylene glycol, zolpidem  Time spent: 30 minutes  Author: Berle Mull, MD [facility] Pager: [phone] 12/03/2016 1:58 PM  If 7PM-7AM, please contact night-coverage at [email or web address], password [facility]

## 2016-12-03 NOTE — Evaluation (Signed)
Physical Therapy Evaluation Patient Details Name: [name], [name] MRN: [id number] DOB: 05-13-1934 Today's Date: 12/03/2016   History of Present Illness   [name], [name] is a 83 y.o. male  with past medical history significant for BPH, kidney cysts, memory loss, and STEMI, pituitary tumor, urinary retention, chronic low back pain who presents emergency room after multiple falls.  Clinical Impression  Pt admitted with above diagnosis. Pt currently with functional limitations due to the deficits listed below (see PT Problem List). Pt currently unable to mobilize safely. On eval, he verbalized minimally, was confused by daily tasks such as getting to EOB, needed verbal and tactile cues. Experienced dizziness and weakness in standing to the point that he was unable to walk safely. BP sup 104/50, sit 137/108, stand 58/48. May benefit from neuro consult. Would not be safe going home today and at this point, recommending some post acute rehab.  Pt will benefit from skilled PT to increase their independence and safety with mobility to allow discharge to the venue listed below.       Follow Up Recommendations CIR;Supervision/Assistance - 24 hour    Equipment Recommendations  Other (comment) (TBD)    Recommendations for Other Services OT consult;Rehab consult     Precautions / Restrictions Precautions Precautions: Fall Restrictions Weight Bearing Restrictions: No      Mobility  Bed Mobility Overal bed mobility: Needs Assistance Bed Mobility: Supine to Sit     Supine to sit: Min assist     General bed mobility comments: pt needed assistance removing covers and getting hips to EOB, appeared confused during process  Transfers Overall transfer level: Needs assistance Equipment used: Rolling walker (2 wheeled) Transfers: Sit to/from [facility] Sit to Stand: Min assist Stand pivot transfers: Min assist       General transfer comment: min A to steady, vc's for hand  placement. Min A with RW to pivot to recliner. Pt could not maintain standing more than 2 mins before feeling that he would fall and needed to sit  Ambulation/Gait             General Gait Details: unable due to weakness and dizziness  Stairs            Wheelchair Mobility    Modified Rankin (Stroke Patients Only)       Balance Overall balance assessment: Needs assistance;History of Falls Sitting-balance support: Single extremity supported;Feet supported Sitting balance-Leahy Scale: Fair Sitting balance - Comments: increased sway EOB, never fully lost balance but occasionally needed steadying hand for support   Standing balance support: Bilateral upper extremity supported Standing balance-Leahy Scale: Poor Standing balance comment: unstable in standing, needed UE support today for safety                             Pertinent Vitals/Pain Pain Assessment: Faces Faces Pain Scale: Hurts even more Pain Location: nose Pain Descriptors / Indicators: Aching Pain Intervention(s): Monitored during session    Home Living Family/patient expects to be discharged to:: Private residence Living Arrangements: Spouse/significant other Available Help at Discharge: Family;Available PRN/intermittently Type of Home: House Home Access: Stairs to enter Entrance Stairs-Rails: None Entrance Stairs-Number of Steps: 2 Home Layout: Multi-level;Able to live on main level with bedroom/bathroom Home Equipment: None      Prior Function Level of Independence: Independent         Comments: pt drove, golfed, independent with ADL's. His wife mentioned was that  she drives when they go longer distances. He was just at a lax tournament in [place] Friday night. Wife also reports that he has neuropathy in B feet since a LB surgery and that he has had increased frequency of falls over past several years but he usually does not get hurt     Hand Dominance        Extremity/Trunk  Assessment   Upper Extremity Assessment Upper Extremity Assessment: Generalized weakness    Lower Extremity Assessment Lower Extremity Assessment: Generalized weakness    Cervical / Trunk Assessment Cervical / Trunk Assessment: Kyphotic  Communication   Communication: Other (comment) (minimal verbalization on eval)  Cognition Arousal/Alertness: Awake/alert Behavior During Therapy: Flat affect Overall Cognitive Status: Impaired/Different from baseline Area of Impairment: Following commands;Problem solving       Following Commands: Follows one step commands consistently;Follows one step commands with increased time;Follows multi-step commands inconsistently     Problem Solving: Slow processing;Decreased initiation;Requires verbal cues;Requires tactile cues;Difficulty sequencing General Comments: pt's wife reports that at baseline he is very talkative and is not like himself at all today. He admits that he is currently depressed by these current events. Though he also had difficulty following commands for bed mobility and transfers. He was confused and slightly distracted by BP cuff placed around his arm. He needed vc's for sequencing tasks.     General Comments General comments (skin integrity, edema, etc.): BP sup 104/50, sitting 137/108, standing 58/48    Exercises     Assessment/Plan    PT Assessment Patient needs continued PT services  PT Problem List Decreased strength;Decreased activity tolerance;Decreased balance;Decreased mobility;Decreased cognition;Decreased coordination;Decreased knowledge of use of DME;Decreased safety awareness;Decreased knowledge of precautions;Pain       PT Treatment Interventions DME instruction;Gait training;Stair training;Functional mobility training;Therapeutic activities;Therapeutic exercise;Balance training;Cognitive remediation;Patient/family education    PT Goals (Current goals can be found in the Care Plan section)  Acute Rehab PT  Goals Patient Stated Goal: return home PT Goal Formulation: With patient/family Time For Goal Achievement: 12/17/16 Potential to Achieve Goals: Good    Frequency Min 3X/week   Barriers to discharge        Co-evaluation               End of Session Equipment Utilized During Treatment: Gait belt Activity Tolerance: Other (comment) (orthostatic hypotension) Patient left: in chair;with call bell/phone within reach;with chair alarm set;with family/visitor present Nurse Communication: Mobility status PT Visit Diagnosis: Unsteadiness on feet (R26.81);Dizziness and giddiness (R42);Muscle weakness (generalized) (M62.81);History of falling (Z91.81);Pain Pain - part of body:  (nose)    Functional Assessment Tool Used: AM-PAC 6 Clicks Basic Mobility Functional Limitation: Mobility: Walking and moving around Mobility: Walking and Moving Around Current Status [id number]): At least 40 percent but less than 60 percent impaired, limited or restricted Mobility: Walking and Moving Around Goal Status [phone]): At least 1 percent but less than 20 percent impaired, limited or restricted    Time: [id number] PT Time Calculation (min) (ACUTE ONLY): 46 min   Charges:   PT Evaluation $PT Eval Moderate Complexity: 1 Procedure PT Treatments $Therapeutic Activity: 23-37 mins   PT G Codes:   PT G-Codes **NOT FOR INPATIENT CLASS** Functional Assessment Tool Used: AM-PAC 6 Clicks Basic Mobility Functional Limitation: Mobility: Walking and moving around Mobility: Walking and Moving Around Current Status [id number]): At least 40 percent but less than 60 percent impaired, limited or restricted Mobility: Walking and Moving Around Goal Status [id number]): At least 1 percent but less than 20 percent  impaired, limited or restricted   [place], Garrett  Gilbert 12/03/2016, 12:00 PM

## 2016-12-03 NOTE — Progress Notes (Signed)
Rehab Admissions Coordinator Note:  Patient was screened by Cleatrice Burke for appropriateness for an Inpatient Acute Rehab Consult per PT recommendation.  At this time, we are recommending Inpatient Rehab consult.  Cleatrice Burke 12/03/2016, 12:15 PM  I can be reached at [phone].

## 2016-12-03 NOTE — Consult Note (Signed)
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 History taken from chart review and wife.  Patient lives with spouse. Reported to be independent prior to admission and still drives as well as plays golf. Presented 12/02/2016 with multiple falls as well as inability to avoid void. Cranial CT scan reviewed, unremarkable for acute process. CT cervical spine unremarkable. CT maxillofacial negative. Chest x-ray no active disease. Mild elevation in creatinine 1.35 from baseline 1.10. Urine culture multiple species. Troponin greater than 20. Serial troponin orders. EKG reviewed, no acute changes. Normal sinus rhythm and complete right bundle branch block no acute ST changes. Echocardiogram relatively unremarkable. Physical therapy evaluation completed 12/03/2016 with recommendations of physical medicine rehabilitation consult.   Review of Systems  Constitutional: Negative for chills and fever.  HENT: Negative for hearing loss and tinnitus.   Eyes: Negative for blurred vision and double vision.  Respiratory: Negative for cough and shortness of breath.   Cardiovascular: Positive for palpitations. Negative for chest pain and leg swelling.  Gastrointestinal: Positive for constipation. Negative for nausea and vomiting.  Genitourinary: Negative for hematuria.       Urinary retention  Musculoskeletal: Positive for back pain and falls.  Skin: Negative for rash.  Neurological: Positive for weakness. Negative for seizures.  Psychiatric/Behavioral: Positive for memory loss.  All other systems reviewed and are negative.  Past Medical History:  Diagnosis Date  . Atrial  premature beats   . BPH (benign prostatic hyperplasia)   . BPH (benign prostatic hypertrophy)   . Cataract, right eye   . Chronic low back pain   . Colon polyp   . Coronary artery disease   . DDD (degenerative disc disease), lumbar   . Dermatitis 11/16  . Hyperlipidemia   . Insomnia   . Kidney cysts   . Memory loss 2015  . Non-ischemic cardiomyopathy ([place])   . NSTEMI (non-ST elevated myocardial infarction) ([place]) 08/25/2014  . Osteoarthritis of right knee   . Pituitary tumor 2011   macroadenoma w/ VF compromise-then gamma knife a few years leater for some recurrence  . Status post insertion of drug-eluting stent into left anterior descending (LAD) artery 08/28/15   + nuc study.   . Urinary retention   . Vitamin D deficiency 01/2008   Past Surgical History:  Procedure Laterality Date  . ARTHROSCOPIC SURGERY    . BACK SURGERY    . CARDIAC CATHETERIZATION  07/27/2015   Dr Aundra Dubin; oLAD 95%, mLAD 50%, CFX stent OK, OM1 60%, OM2 99%, pPLOM 50%, RCA irregular, AM2 70%, EF 55-60%, no regional wall motion abnormalities.     Marland Kitchen CARDIAC CATHETERIZATION N/A 07/28/2015   Procedure: Left Heart Cath and Coronary Angiography;  Surgeon: [name], MD;  Location: [facility];  Service: Cardiovascular;  Laterality: N/A;  . CARDIAC CATHETERIZATION N/A 07/28/2015   Procedure: Coronary Stent Intervention;  Surgeon: Troy Sine, MD;  Location: [facility];  Service: Cardiovascular;  Laterality: N/A;  . CATARACT EXTRACTION    . CORONARY ANGIOPLASTY WITH STENT PLACEMENT  07/27/2015   XIENCE ALPINE RX 3.0X33 DES to the LAD, 95%>>0  . HAMMER  BUNION TOE SURGERY    . HAND  SURGERY Left   . LEFT HEART CATHETERIZATION WITH CORONARY ANGIOGRAM N/A 08/25/2014   Procedure: LEFT HEART CATHETERIZATION WITH CORONARY ANGIOGRAM;  Surgeon: Burnell Blanks, MD;  Location: [facility] CATH [facility];  Service: Cardiovascular;  Laterality: N/A;  . PITUITARY SURGERY     12/01/09 Dr. Wilburn Cornelia and [name].     Family History  Problem Relation Age of Onset  . Congestive Heart Failure Father 52  . Other Mother 50  . Coronary artery disease Brother   . Other Sister 49    polio  . Heart attack Brother     MI/ASCAD 2006  . COPD Sister 61   Social History:  reports that he has never smoked. He has never used smokeless tobacco. He reports that he drinks alcohol. He reports that he does not use drugs. Allergies:  Allergies  Allergen Reactions  . Sulfamethoxazole Anaphylaxis and Swelling  . Bee Venom Rash  . Sulfa Antibiotics Nausea And Vomiting  . Sulfonamide Derivatives Nausea And Vomiting  . Tamsulosin     Unknown pt doesn't think he is allergic    Medications Prior to Admission  Medication Sig Dispense Refill  . ALPRAZolam (XANAX) 0.5 MG tablet Take 0.5 mg by mouth as needed for anxiety.     Marland Kitchen aspirin 81 MG tablet Take 81 mg by mouth daily.      . carvedilol (COREG) 3.125 MG tablet take 1/2 tablet by mouth at bedtime 30 tablet 6  . Coenzyme Q10 (COQ10) 200 MG CAPS Take 1 capsule by mouth daily.      . ergocalciferol (VITAMIN D2) 50000 UNITS capsule Take 50,000 Units by mouth 2 (two) times a week.     . Evolocumab (REPATHA) 140 MG/ML SOSY Inject into the skin every 14 (fourteen) days.    . finasteride (PROSCAR) 5 MG tablet Take 5 mg by mouth daily.      . hydrocortisone (CORTEF) 10 MG tablet Take 15 mg by mouth daily. One and on half tablet daily    . levothyroxine (SYNTHROID, LEVOTHROID) 150 MCG tablet Take 150 mcg by mouth daily before breakfast.     . MYRBETRIQ 50 MG TB24 tablet Take 50 mg by mouth daily.  0  . nitroGLYCERIN (NITROSTAT) 0.4 MG SL tablet Place 1 tablet (0.4 mg total) under the tongue every 5 (five) minutes as needed for chest pain. 90 tablet 3  . Omega-3 1000 MG CAPS Take 1 capsule (1,000 mg total) by mouth 2 (two) times daily. 60 capsule 11  . omeprazole (PRILOSEC) 40 MG capsule Take 40 mg by mouth daily.  1  . Propylene Glycol (SYSTANE BALANCE OP) Apply 2 drops to  eye daily as needed (dry eye).    . Red Yeast Rice Extract 600 MG CAPS Take 600 mg by mouth daily.    . tadalafil (CIALIS) 5 MG tablet Take 1 tablet (5 mg total) by mouth daily as needed for erectile dysfunction. Do not take nitro within 48 hrs after taking cialis 10 tablet 1  . testosterone cypionate (DEPOTESTOTERONE CYPIONATE) 200 MG/ML injection Inject into the muscle every 21 ( twenty-one) days. 1.62 % injection    . ticagrelor (BRILINTA) 60 MG TABS tablet Take 1 tablet (60 mg total) by mouth 2 (two) times daily. 180 tablet 3  . tolterodine (DETROL LA) 4 MG 24 hr capsule Take 4 mg by mouth daily.  0  . VESICARE 10 MG tablet Take 5 mg by mouth daily.   10  . EPIPEN 2-PAK 0.3 MG/0.3ML SOAJ injection  Inject 1 Dose as directed as needed.  0    Home: Home Living Family/patient expects to be discharged to:: Private residence Living Arrangements: Spouse/significant other Available Help at Discharge: Family, Available PRN/intermittently Type of Home: House Home Access: Stairs to enter Technical brewer of Steps: 2 Entrance Stairs-Rails: None Home Layout: Multi-level, Able to live on main level with bedroom/bathroom Home Equipment: None  Functional History: Prior Function Level of Independence: Independent Comments: pt drove, golfed, independent with ADL's. His wife mentioned was that she drives when they go longer distances. He was just at a lax tournament in [place] Friday night. Wife also reports that he has neuropathy in B feet since a LB surgery and that he has had increased frequency of falls over past several years but he usually does not get hurt Functional Status:  Mobility: Bed Mobility Overal bed mobility: Needs Assistance Bed Mobility: Supine to Sit Supine to sit: Min assist General bed mobility comments: pt needed assistance removing covers and getting hips to EOB, appeared confused during process Transfers Overall transfer level: Needs assistance Equipment used:  Rolling walker (2 wheeled) Transfers: Sit to/from Stand, [employer] Transfers Sit to Stand: Min assist Stand pivot transfers: Min assist General transfer comment: min A to steady, vc's for hand placement. Min A with RW to pivot to recliner. Pt could not maintain standing more than 2 mins before feeling that he would fall and needed to sit Ambulation/Gait General Gait Details: unable due to weakness and dizziness    ADL:    Cognition: Cognition Overall Cognitive Status: Impaired/Different from baseline Orientation Level: Oriented X4 Cognition Arousal/Alertness: Awake/alert Behavior During Therapy: Flat affect Overall Cognitive Status: Impaired/Different from baseline Area of Impairment: Following commands, Problem solving Following Commands: Follows one step commands consistently, Follows one step commands with increased time, Follows multi-step commands inconsistently Problem Solving: Slow processing, Decreased initiation, Requires verbal cues, Requires tactile cues, Difficulty sequencing General Comments: pt's wife reports that at baseline he is very talkative and is not like himself at all today. He admits that he is currently depressed by these current events. Though he also had difficulty following commands for bed mobility and transfers. He was confused and slightly distracted by BP cuff placed around his arm. He needed vc's for sequencing tasks.   Blood pressure (!) 118/54, pulse 60, temperature 97.9 F (36.6 C), temperature source Oral, resp. rate 18, height 6\' 4"  (1.93 m), weight 93.7 kg (206 lb 9.1 oz), SpO2 97 %. Physical Exam  Vitals reviewed. Constitutional: He is oriented to person, place, and time. He appears well-developed and well-nourished.  HENT:  Head: Normocephalic.  Healing abrasions to the face  Eyes: Conjunctivae and EOM are normal.  Neck: Normal range of motion. Neck supple. No thyromegaly present.  Cardiovascular: Normal rate and regular rhythm.    Respiratory: Effort normal and breath sounds normal. No respiratory distress.  GI: Soft. Bowel sounds are normal. He exhibits no distension.  Musculoskeletal: He exhibits no edema or tenderness.  Neurological: He is alert and oriented to person, place, and time.  Patient makes good eye contact with examiner.  He was able to follow simple commands. Delay in processing. Motor: 4+/5 throughout Sensation intact to light touch.  Skin: Skin is warm and dry.  Healing abrasions  Psychiatric: His affect is blunt. His speech is delayed. He is slowed.    Results for orders placed or performed during the hospital encounter of 12/02/16 (from the past 24 hour(s))  Troponin I     Status:  None   Collection Time: 12/02/16  6:13 PM  Result Value Ref Range   Troponin I <0.03 <0.03 ng/mL  C difficile quick scan w PCR reflex     Status: None   Collection Time: 12/03/16 12:10 AM  Result Value Ref Range   C Diff antigen NEGATIVE NEGATIVE   C Diff toxin NEGATIVE NEGATIVE   C Diff interpretation No C. difficile detected.   Basic metabolic panel     Status: Abnormal   Collection Time: 12/03/16  5:24 AM  Result Value Ref Range   Sodium 134 (L) 135 - 145 mmol/L   Potassium 4.0 3.5 - 5.1 mmol/L   Chloride 105 101 - 111 mmol/L   CO2 22 22 - 32 mmol/L   Glucose, Bld 90 65 - 99 mg/dL   BUN 17 6 - 20 mg/dL   Creatinine, Ser 1.17 0.61 - 1.24 mg/dL   Calcium 8.7 (L) 8.9 - 10.3 mg/dL   GFR calc non Af Amer 57 (L) >60 mL/min   GFR calc Af Amer >60 >60 mL/min   Anion gap 7 5 - 15  CBC     Status: Abnormal   Collection Time: 12/03/16  5:24 AM  Result Value Ref Range   WBC 13.8 (H) 4.0 - 10.5 K/uL   RBC 4.95 4.22 - 5.81 MIL/uL   Hemoglobin 15.3 13.0 - 17.0 g/dL   HCT 44.6 39.0 - 52.0 %   MCV 90.1 78.0 - 100.0 fL   MCH 30.9 26.0 - 34.0 pg   MCHC 34.3 30.0 - 36.0 g/dL   RDW 13.6 11.5 - 15.5 %   Platelets 176 150 - 400 K/uL   Ct Abdomen Pelvis Wo Contrast  Result Date: 12/02/2016 CLINICAL DATA:  Fall,  incontinence. EXAM: CT ABDOMEN AND PELVIS WITHOUT CONTRAST TECHNIQUE: Multidetector CT imaging of the abdomen and pelvis was performed following the standard protocol without IV contrast. COMPARISON:  None. FINDINGS: Lower chest: No acute abnormality. Hepatobiliary: Layering stones within the otherwise normal-appearing gallbladder. No focal abnormality identified within the liver. No bile duct dilatation. Pancreas: Unremarkable. No pancreatic ductal dilatation or surrounding inflammatory changes. Spleen: Normal in size without focal abnormality. Adrenals/Urinary Tract: Adrenal glands appear normal. Large right renal cyst. Small left renal cyst. No renal stone or hydronephrosis bilaterally. No ureteral or bladder calculi identified. Stomach/Bowel: Bowel is normal in caliber. No bowel wall thickening or evidence of bowel wall inflammation seen. Fluid is present within the large and small bowel compatible with history of diarrhea. Vascular/Lymphatic: Aortic atherosclerosis. No enlarged lymph nodes seen. Reproductive: Unremarkable. Other: No free fluid or abscess collection. No free intraperitoneal air. Musculoskeletal: Degenerative changes throughout the thoracolumbar spine, at least moderate in degree. No acute or suspicious osseous finding. IMPRESSION: 1. Fluid throughout the majority of the nondistended large bowel, compatible with history of diarrhea. No evidence of focal bowel wall thickening or inflammation. I cannot exclude a mild diffuse colitis. 2. Cholelithiasis without evidence of acute cholecystitis. 3. Aortic atherosclerosis. 4. Additional chronic/incidental findings detailed above. Electronically Signed   By: Franki Cabot M.D.   On: 12/02/2016 18:25   Dg Chest 2 View  Result Date: 12/02/2016 CLINICAL DATA:  Syncope. EXAM: CHEST  2 VIEW COMPARISON:  Radiographs of July 10, 2016. FINDINGS: The heart size and mediastinal contours are within normal limits. Both lungs are clear. No pneumothorax or  pleural effusion is noted. The visualized skeletal structures are unremarkable. IMPRESSION: No active cardiopulmonary disease. Electronically Signed   By: Marijo Conception, M.D.  On: 12/02/2016 11:40   Ct Head Wo Contrast  Result Date: 12/02/2016 CLINICAL DATA:  Syncopal episode this morning. Fall. Facial injury. Initial encounter. EXAM: CT HEAD WITHOUT CONTRAST CT MAXILLOFACIAL WITHOUT CONTRAST CT CERVICAL SPINE WITHOUT CONTRAST TECHNIQUE: Multidetector CT imaging of the head, cervical spine, and maxillofacial structures were performed using the standard protocol without intravenous contrast. Multiplanar CT image reconstructions of the cervical spine and maxillofacial structures were also generated. COMPARISON:  Brain MRI 02/10/2014. Head CT 12/02/2009. Cervical spine radiographs 05/14/2007. FINDINGS: CT HEAD FINDINGS Brain: Mega cisterna magna is unchanged, a normal variant. There is mild cerebral atrophy. Patchy hypodensities in the subcortical and periventricular cerebral white matter bilaterally are nonspecific but compatible with mild chronic small vessel ischemic disease. There is no evidence of acute cortical infarct, intracranial hemorrhage, mass, midline shift, or extra-axial fluid collection. Postoperative changes are again noted related to prior trans-sphenoidal pituitary tumor resection, with small volume nodular tissue extending into the sphenoid sinus better evaluated on the prior MRI. Vascular: No hyperdense vessel or unexpected calcification. Skull: No skull fracture. Other: None. CT MAXILLOFACIAL FINDINGS Osseous: There is a nondisplaced nasal bone fracture anteriorly with minimal angulation and surrounding soft tissue swelling/hematoma. No additional maxillofacial fractures are identified. Orbits: Prior bilateral cataract extraction.  No orbital hematoma. Sinuses: Postsurgical changes from trans-sphenoidal pituitary mass resection as above. Hypoplastic frontal sinuses. Minimal right ethmoid and  right maxillary sinus mucosal thickening. Trace right mastoid effusion. Small nasal septal perforation. Blood in the anterior nasal cavity, left greater than right. Soft tissues: Otherwise negative. CT CERVICAL SPINE FINDINGS Alignment: Cervical spine straightening. Minimal anterolisthesis of C3 on C4. Skull base and vertebrae: No evidence of acute fracture or destructive osseous process. Soft tissues and spinal canal: No prevertebral fluid or swelling. No visible canal hematoma. Disc levels: Diffuse cervical spondylosis. Severe disc space narrowing at C5-6 with milder narrowing at C4-5 and C6-7. Moderate diffuse facet arthrosis. Left facet ankylosis at C2-3. Mild calcified pannus posterior to the dens. Upper chest: Unremarkable. Other: Retropharyngeal course of the proximal right ICA. IMPRESSION: 1. No evidence of acute intracranial abnormality. 2. Mild chronic small vessel ischemic disease and postsurgical changes from pituitary adenoma resection as above. 3. Nondisplaced nasal bone fracture. 4. No evidence of acute cervical spine fracture or traumatic subluxation. Electronically Signed   By: Logan Bores M.D.   On: 12/02/2016 11:16   Ct Cervical Spine Wo Contrast  Result Date: 12/02/2016 CLINICAL DATA:  Syncopal episode this morning. Fall. Facial injury. Initial encounter. EXAM: CT HEAD WITHOUT CONTRAST CT MAXILLOFACIAL WITHOUT CONTRAST CT CERVICAL SPINE WITHOUT CONTRAST TECHNIQUE: Multidetector CT imaging of the head, cervical spine, and maxillofacial structures were performed using the standard protocol without intravenous contrast. Multiplanar CT image reconstructions of the cervical spine and maxillofacial structures were also generated. COMPARISON:  Brain MRI 02/10/2014. Head CT 12/02/2009. Cervical spine radiographs 05/14/2007. FINDINGS: CT HEAD FINDINGS Brain: Mega cisterna magna is unchanged, a normal variant. There is mild cerebral atrophy. Patchy hypodensities in the subcortical and periventricular  cerebral white matter bilaterally are nonspecific but compatible with mild chronic small vessel ischemic disease. There is no evidence of acute cortical infarct, intracranial hemorrhage, mass, midline shift, or extra-axial fluid collection. Postoperative changes are again noted related to prior trans-sphenoidal pituitary tumor resection, with small volume nodular tissue extending into the sphenoid sinus better evaluated on the prior MRI. Vascular: No hyperdense vessel or unexpected calcification. Skull: No skull fracture. Other: None. CT MAXILLOFACIAL FINDINGS Osseous: There is a nondisplaced nasal bone fracture anteriorly with  minimal angulation and surrounding soft tissue swelling/hematoma. No additional maxillofacial fractures are identified. Orbits: Prior bilateral cataract extraction.  No orbital hematoma. Sinuses: Postsurgical changes from trans-sphenoidal pituitary mass resection as above. Hypoplastic frontal sinuses. Minimal right ethmoid and right maxillary sinus mucosal thickening. Trace right mastoid effusion. Small nasal septal perforation. Blood in the anterior nasal cavity, left greater than right. Soft tissues: Otherwise negative. CT CERVICAL SPINE FINDINGS Alignment: Cervical spine straightening. Minimal anterolisthesis of C3 on C4. Skull base and vertebrae: No evidence of acute fracture or destructive osseous process. Soft tissues and spinal canal: No prevertebral fluid or swelling. No visible canal hematoma. Disc levels: Diffuse cervical spondylosis. Severe disc space narrowing at C5-6 with milder narrowing at C4-5 and C6-7. Moderate diffuse facet arthrosis. Left facet ankylosis at C2-3. Mild calcified pannus posterior to the dens. Upper chest: Unremarkable. Other: Retropharyngeal course of the proximal right ICA. IMPRESSION: 1. No evidence of acute intracranial abnormality. 2. Mild chronic small vessel ischemic disease and postsurgical changes from pituitary adenoma resection as above. 3.  Nondisplaced nasal bone fracture. 4. No evidence of acute cervical spine fracture or traumatic subluxation. Electronically Signed   By: Logan Bores M.D.   On: 12/02/2016 11:16   Ct Maxillofacial Wo Contrast  Result Date: 12/02/2016 CLINICAL DATA:  Syncopal episode this morning. Fall. Facial injury. Initial encounter. EXAM: CT HEAD WITHOUT CONTRAST CT MAXILLOFACIAL WITHOUT CONTRAST CT CERVICAL SPINE WITHOUT CONTRAST TECHNIQUE: Multidetector CT imaging of the head, cervical spine, and maxillofacial structures were performed using the standard protocol without intravenous contrast. Multiplanar CT image reconstructions of the cervical spine and maxillofacial structures were also generated. COMPARISON:  Brain MRI 02/10/2014. Head CT 12/02/2009. Cervical spine radiographs 05/14/2007. FINDINGS: CT HEAD FINDINGS Brain: Mega cisterna magna is unchanged, a normal variant. There is mild cerebral atrophy. Patchy hypodensities in the subcortical and periventricular cerebral white matter bilaterally are nonspecific but compatible with mild chronic small vessel ischemic disease. There is no evidence of acute cortical infarct, intracranial hemorrhage, mass, midline shift, or extra-axial fluid collection. Postoperative changes are again noted related to prior trans-sphenoidal pituitary tumor resection, with small volume nodular tissue extending into the sphenoid sinus better evaluated on the prior MRI. Vascular: No hyperdense vessel or unexpected calcification. Skull: No skull fracture. Other: None. CT MAXILLOFACIAL FINDINGS Osseous: There is a nondisplaced nasal bone fracture anteriorly with minimal angulation and surrounding soft tissue swelling/hematoma. No additional maxillofacial fractures are identified. Orbits: Prior bilateral cataract extraction.  No orbital hematoma. Sinuses: Postsurgical changes from trans-sphenoidal pituitary mass resection as above. Hypoplastic frontal sinuses. Minimal right ethmoid and right  maxillary sinus mucosal thickening. Trace right mastoid effusion. Small nasal septal perforation. Blood in the anterior nasal cavity, left greater than right. Soft tissues: Otherwise negative. CT CERVICAL SPINE FINDINGS Alignment: Cervical spine straightening. Minimal anterolisthesis of C3 on C4. Skull base and vertebrae: No evidence of acute fracture or destructive osseous process. Soft tissues and spinal canal: No prevertebral fluid or swelling. No visible canal hematoma. Disc levels: Diffuse cervical spondylosis. Severe disc space narrowing at C5-6 with milder narrowing at C4-5 and C6-7. Moderate diffuse facet arthrosis. Left facet ankylosis at C2-3. Mild calcified pannus posterior to the dens. Upper chest: Unremarkable. Other: Retropharyngeal course of the proximal right ICA. IMPRESSION: 1. No evidence of acute intracranial abnormality. 2. Mild chronic small vessel ischemic disease and postsurgical changes from pituitary adenoma resection as above. 3. Nondisplaced nasal bone fracture. 4. No evidence of acute cervical spine fracture or traumatic subluxation. Electronically Signed   By: Zenia Resides  Jeralyn Ruths M.D.   On: 12/02/2016 11:16    Assessment/Plan: Diagnosis: Debilitation Labs and images independently reviewed.  Records reviewed and summated above.  1. Does the need for close, 24 hr/day medical supervision in concert with the patient's rehab needs make it unreasonable for this patient to be served in a less intensive setting? Yes 2. Co-Morbidities requiring supervision/potential complications: AKI (avoid nephrotoxic meds), right bundle branch block, BPH (meds, monitor for retention), memory loss, CAD with STEMI (cont meds), pituitary tumor s/p resection (cont steroids), chronic low back pain (Biofeedback training with therapies to help reduce reliance on opiate pain medications, monitor pain control during therapies, and sedation at rest and titrate to maximum efficacy to ensure participation and gains in  therapies), orthostasis (ensure appropriate hydration, consider abd binder, teds), hyponatremia (cont to monitor, consider meds), leukocytosis (cont to monitor for signs and symptoms of infection, further workup if indicated), diarrhea (cont meds prn). 3. Due to bladder management, bowel management, safety, skin/wound care, disease management, medication administration, pain management and patient education, does the patient require 24 hr/day rehab nursing? Yes 4. Does the patient require coordinated care of a physician, rehab nurse, PT (1-2 hrs/day, 5 days/week), OT (1-2 hrs/day, 5 days/week) and SLP (1-2 hrs/day, 5 days/week) to address physical and functional deficits in the context of the above medical diagnosis(es)? Yes Addressing deficits in the following areas: balance, endurance, locomotion, transferring, bowel/bladder control, bathing, dressing, toileting, cognition and psychosocial support 5. Can the patient actively participate in an intensive therapy program of at least 3 hrs of therapy per day at least 5 days per week? Yes 6. The potential for patient to make measurable gains while on inpatient rehab is excellent 7. Anticipated functional outcomes upon discharge from inpatient rehab are modified independent  with PT, independent and modified independent with OT, n/a with SLP. 8. Estimated rehab length of stay to reach the above functional goals is: 7-10 days. 9. Does the patient have adequate social supports and living environment to accommodate these discharge functional goals? Yes 10. Anticipated D/C setting: Home 11. Anticipated post D/C treatments: HH therapy and Home excercise program 12. Overall Rehab/Functional Prognosis: excellent  RECOMMENDATIONS: This patient's condition is appropriate for continued rehabilitative care in the following setting: Recommend CIR, however, patient refusing stating that was he was ready to go home today.  Wife also states "that is not an option", but  later seems more willing and statse they will see how he does with therapies today.  Patient has agreed to participate in recommended program. Potentially Note that insurance prior authorization may be required for reimbursement for recommended care.  Comment: Rehab Admissions Coordinator to follow up.  Delice Lesch, MD, Mellody Drown Cathlyn Parsons., PA-C 12/03/2016

## 2016-12-03 NOTE — Progress Notes (Signed)
Patient with no complaints or concerns during 7pm - 7am shift. Slept overnight. Stool sample still pending.   Jerremy Maione, RN

## 2016-12-03 NOTE — Progress Notes (Signed)
  Echocardiogram 2D Echocardiogram with Definity has been performed.  Tresa Res 12/03/2016, 1:28 PM

## 2016-12-04 MED ORDER — SALINE SPRAY 0.65 % NA SOLN: 1.0000 | NASAL | Status: DC | PRN

## 2016-12-04 MED ORDER — POLYVINYL ALCOHOL 1.4 % OP SOLN
2.0000 [drp] | OPHTHALMIC | Status: DC | PRN
  Administered 2016-12-05: 2 [drp] via OPHTHALMIC
  Filled 2016-12-04: qty 15

## 2016-12-04 MED ORDER — LIP MEDEX EX OINT: 1.0000 "application " | TOPICAL_OINTMENT | CUTANEOUS | Status: DC | PRN

## 2016-12-04 MED ORDER — MUSCLE RUB 10-15 % EX CREA: 1.0000 "application " | TOPICAL_CREAM | CUTANEOUS | Status: DC | PRN

## 2016-12-04 MED ORDER — HYDROCORTISONE NA SUCCINATE PF 100 MG IJ SOLR
50.0000 mg | Freq: Two times a day (BID) | INTRAMUSCULAR | Status: DC
  Administered 2016-12-04 – 2016-12-06 (×4): 50 mg via INTRAVENOUS
  Filled 2016-12-04 (×4): qty 2

## 2016-12-04 MED ORDER — ALFUZOSIN HCL ER 10 MG PO TB24
10.0000 mg | ORAL_TABLET | Freq: Every day | ORAL | Status: DC
  Administered 2016-12-04 – 2016-12-05 (×2): 10 mg via ORAL
  Filled 2016-12-04 (×3): qty 1

## 2016-12-04 MED ORDER — GUAIFENESIN-DM 100-10 MG/5ML PO SYRP: 5.0000 mL | ORAL_SOLUTION | ORAL | Status: DC | PRN

## 2016-12-04 MED ORDER — BLISTEX MEDICATED EX OINT
TOPICAL_OINTMENT | CUTANEOUS | Status: DC | PRN
  Filled 2016-12-04: qty 6.3

## 2016-12-04 MED ORDER — TAMSULOSIN HCL 0.4 MG PO CAPS: 0.4000 mg | ORAL_CAPSULE | Freq: Every day | ORAL | Status: DC

## 2016-12-04 NOTE — Progress Notes (Signed)
Patient, pt's wife, Dr Posey Pronto and this nurse were at bedside this morning to decipher pt's appropriate medications. Dr Posey Pronto made changes and discussed with patient that he should follow the medications prescribed inpatient, and avoid taking over the counter and previously prescribed medications   Patient and patient's wife expressed understanding.

## 2016-12-04 NOTE — Progress Notes (Signed)
I met with pt, his wife, and daughter at bedside to discuss his rehab needs. Yesterday he did not want to consider an inpt rehab admission. Today, wife states they have spoken with Attending MD and agree that we will assess BP, urine retention and mobility issues today to determine if pt felt safe to d/c home with [facility] or if he would need to consider an inpt rehab admission. I have discussed with RN and P.T. And will follow up. [phone]

## 2016-12-04 NOTE — Progress Notes (Signed)
Physical Therapy Treatment Patient Details Name: [name], [name] MRN: [id number] DOB: 03-Mar-1934 Today's Date: 12/04/2016    History of Present Illness  [name], [name] is a 83 y.o. male  with past medical history significant for BPH, kidney cysts, memory loss, and STEMI, pituitary tumor, urinary retention, chronic low back pain who presents emergency room after multiple falls.    PT Comments    Pt demonstrating improved mobility during PT session, able to ambulate 50 ft with min guard assist using rw. Verbal and physical assist needed for safety. Orthostatic BP taken during session; supine 132/36, sitting 35/75, standing 92/76, 34min standing 112/80. Cognitively the pt is still confused but family confirming improvement. PT continuing to recommend CIR for further rehabilitation following acute stay. Will continue to follow acutely to progress mobility and safety. Spouse and daughter present for session and reporting that they are uncertain on D/C plan at this time.    Follow Up Recommendations  CIR;Supervision/Assistance - 24 hour     Equipment Recommendations   (to be determined)    Recommendations for Other Services       Precautions / Restrictions Precautions Precautions: Fall Restrictions Weight Bearing Restrictions: No    Mobility  Bed Mobility Overal bed mobility: Needs Assistance Bed Mobility: Supine to Sit     Supine to sit: Supervision     General bed mobility comments: HOB elevated, using rail to assist.   Transfers Overall transfer level: Needs assistance Equipment used: Rolling walker (2 wheeled) Transfers: Sit to/from Stand Sit to Stand: Min assist         General transfer comment: Pt reports feeling steady while standing. Orthostatics being taken with positional changes.   Ambulation/Gait Ambulation/Gait assistance: Min guard Ambulation Distance (Feet): 50 Feet Assistive device: Rolling walker (2 wheeled) Gait Pattern/deviations: Step-through  pattern;Trunk flexed Gait velocity: decreased   General Gait Details: mild instability during ambulation with cues for safety and posture.    Stairs            Wheelchair Mobility    Modified Rankin (Stroke Patients Only)       Balance Overall balance assessment: Needs assistance Sitting-balance support: No upper extremity supported Sitting balance-Leahy Scale: Good     Standing balance support: Bilateral upper extremity supported Standing balance-Leahy Scale: Poor Standing balance comment: using rw for support                    Cognition Arousal/Alertness: Awake/alert Behavior During Therapy: WFL for tasks assessed/performed Overall Cognitive Status: Impaired/Different from baseline                 General Comments: Pt confused, [place]. Family stating that this is not accurate and pt is still not at baseline. Pt demonstrates decreased safety awareness during session.     Exercises      General Comments General comments (skin integrity, edema, etc.): BP: supine 132/63, sitting 135/75, standing 92/76, 33min standing 112/80      Pertinent Vitals/Pain Pain Assessment: No/denies pain    Home Living                      Prior Function            PT Goals (current goals can now be found in the care plan section) Acute Rehab PT Goals Patient Stated Goal: return home PT Goal Formulation: With patient/family Time For Goal Achievement: 12/17/16 Potential to Achieve Goals:  Good Progress towards PT goals: Progressing toward goals    Frequency    Min 3X/week      PT Plan Current plan remains appropriate    Co-evaluation             End of Session Equipment Utilized During Treatment: Gait belt Activity Tolerance: Patient tolerated treatment well Patient left: in chair;with call bell/phone within reach;with chair alarm set;with family/visitor present Nurse Communication: Mobility status PT  Visit Diagnosis: Unsteadiness on feet (R26.81);Dizziness and giddiness (R42);Muscle weakness (generalized) (M62.81);History of falling (Z91.81);Pain     Time: [id number] PT Time Calculation (min) (ACUTE ONLY): 29 min  Charges:  $Gait Training: 8-22 mins $Therapeutic Activity: 8-22 mins                    G Codes:       Cassell Clement, PT, CSCS Pager [phone] Office [phone]  12/04/2016, 2:32 PM

## 2016-12-04 NOTE — Progress Notes (Signed)
Pt unable to void since 7pm, previous shift did in and out cath at 17:30 and got 1200cc urine out,  bladder scan done  and showed 855ml. Tylene Fantasia NP notified and ordered in and out cath, order done and got 74ml urine out.

## 2016-12-04 NOTE — Progress Notes (Signed)
Patient is resting after a night full of in/out caths. Will allow pt to rest, and address orthostatics and catheter needs later in the morning.

## 2016-12-04 NOTE — Progress Notes (Signed)
Pt still unable to void, bladder scan showed 455ml. Pt doesn't have the urge to void. Tylene Fantasia notified if pt can have indwelling foley cath, awaiting call back at this time.

## 2016-12-04 NOTE — Progress Notes (Signed)
Pt. Requesting eye drops for dry eyes. On call for [facility] made aware. Daysia Vandenboom, Katherine Roan

## 2016-12-04 NOTE — Progress Notes (Signed)
Page to Dr Posey Pronto   "3e01.Please clarify.. further in/out before foley, or count previous in/out toward x3 and volume 547 now to warrant foley insertion now? "

## 2016-12-04 NOTE — Progress Notes (Signed)
Pt c/o nosebleed after blowing his nose. Pt has current facial fractures since syncopal episode and fall prior to admission.

## 2016-12-04 NOTE — Progress Notes (Signed)
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 Patient does not remember seeing me yesterday. overnight had urinary incontinence requiring intermittent catheterization  Brief hospital course: Pt. with PMH of pituitary tumor S/P resection now on chronic steroids, mild cognitive impairment, overactive bladder and BPH, CAD and ischemic cardiomyopathy; admitted on 12/02/2016, with complaint of fall followed by episodes of diarrhea, was found to have acute kidney injury as well as profound orthostasis. Currently further plan is to gently taper the steroids  Assessment and Plan: 1. Syncope Adrenal insufficiency, dehydration due to diarrhea Orthostatic hypotension.  blood pressure drops from [id number] systolic on supine to 58 systolic on standing. Still drops 20 points today but remains at [id number] systolic after 3 minutes of standing. No significant abnormality on telemetry, echocardiogram unremarkable. As well. EKG unremarkable. no neurological deficit. Diarrhea currently resolved. C. difficile PCR negative. PRN imodium, continue IV hydration. Was receiving 100 every 8 hours Solu-Cortef, I would reduce to 50 every 12 hours today. PTOT consulted who recommends CIR, CIR consulted. Awaiting today's evaluation, may benefit from home health and instead of CIR.  2. Coronary artery disease Chronic systolic and diastolic CHF, ischemic cardio myopathy. EF now normalized, persistent diastolic dysfunction. Continue aspirin, Brilinta. Hold home antihypertensive medication.  3. Recurrent fall. Presented with recurrent fall, also has nasal fracture. No indication for antibiotics. When necessary Ocean Spray. May require Afrin spray.  4. Diarrhea. Probably viral. C.  difficile PCR negative. Use Imodium as well as probiotics.  5. Overactive bladder. BPH. Patient has BPH and also has overactive bladder, has been taking multiple medications for overactive bladder. At present given significant retention and requirement for intermittent catheterization I will discontinue all his medications for overactive bladder and recommend that he remains off of those medications. Bladder scan shows 547 mL urine, Will place Foley catheter. May need to go home with Foley catheter. Starting the patient on alfuzosin, cannot tolerate Flomax. Continue finasteride. Patient follows up with [facility] as an outpatient  Bowel regimen: last BM 12/03/2016 Diet: Cardiac diet DVT Prophylaxis: subcutaneous Heparin  Advance goals of care discussion: full code  Family Communication: family was present at bedside, at the time of interview. The pt provided permission to discuss medical plan with the family. Opportunity was given to ask question and all questions were answered satisfactorily.   Disposition:  Possible Discharge date 12/05/2016. Initial recommendation from PT was  CIR, now with improvement after receiving steroids he may only need home health. PT recommendation currently pending.  Consultants: NONE Procedures: Echocardiogram  Antibiotics: Anti-infectives    None       Objective: Physical Exam: Vitals:   12/03/16 1347 12/03/16 1931 12/04/16 0042 12/04/16 0659  BP: 94/61 106/62 114/67 (!) 118/58  Pulse: 90 78 76 63  Resp: 18 18 18 20   Temp: 97.8 F (36.6 C) 98.2 F (36.8 C) 98.5 F (36.9 C) 97.5 F (36.4 C)  TempSrc: Oral Oral Oral Oral  SpO2: 94% 94% 96% 98%  Weight:    99.7 kg (219 lb 14.4 oz)  Height:        Intake/Output Summary (Last 24 hours) at 12/04/16 1240 Last data filed at 12/04/16 1030  Gross per 24 hour  Intake  1979.25 ml  Output             2500 ml  Net          -520.75 ml   Filed Weights   12/02/16 1847 12/03/16  0715 12/04/16 0659  Weight: 92 kg (202 lb 14.4 oz) 93.7 kg (206 lb 9.1 oz) 99.7 kg (219 lb 14.4 oz)   General: Alert, Awake and Oriented to Time, Place and Person. Appear in mild distress, affect appropriate Eyes: PERRL, Conjunctiva normal ENT: Oral Mucosa clear moist. Resolved nasal swelling with clotted blood, no deformities Neck: no JVD, no Abnormal Mass Or lumps Cardiovascular: S1 and S2 Present, no Murmur, Respiratory: Bilateral Air entry equal and Decreased, no use of accessory muscle, Clear to Auscultation, no Crackles, no wheezes Abdomen: Bowel Sound present, Soft and no tenderness Skin: no redness, no Rash, no induration Extremities: no Pedal edema, no calf tenderness Neurologic: Grossly no focal neuro deficit. Bilaterally Equal motor strength  Data Reviewed: CBC:  Recent Labs Lab 12/02/16 1012 12/03/16 0524  WBC 12.3* 13.8*  NEUTROABS 9.6*  --   HGB 15.0 15.3  HCT 43.4 44.6  MCV 90.2 90.1  PLT 195 0000000   Basic Metabolic Panel:  Recent Labs Lab 12/02/16 1012 12/03/16 0524  NA 135 134*  K 4.3 4.0  CL 102 105  CO2 25 22  GLUCOSE 102* 90  BUN 20 17  CREATININE 1.35* 1.17  CALCIUM 9.1 8.7*    Liver Function Tests: No results for input(s): AST, ALT, ALKPHOS, BILITOT, PROT, ALBUMIN in the last 168 hours. No results for input(s): LIPASE, AMYLASE in the last 168 hours. No results for input(s): AMMONIA in the last 168 hours. Coagulation Profile:  Recent Labs Lab 12/02/16 1012  INR 1.19   Cardiac Enzymes:  Recent Labs Lab 12/02/16 1813  TROPONINI <0.03   BNP (last 3 results) No results for input(s): PROBNP in the last 8760 hours. CBG: No results for input(s): GLUCAP in the last 168 hours. Studies: No results found.  Scheduled Meds: . alfuzosin  10 mg Oral Q breakfast  . aspirin  81 mg Oral Daily  . finasteride  5 mg Oral Daily  . hydrocortisone sod succinate (SOLU-CORTEF) inj  50 mg Intravenous Q12H  . levothyroxine  150 mcg Oral QAC breakfast    . omega-3 acid ethyl esters  1 g Oral BID  . pantoprazole  80 mg Oral Daily  . saccharomyces boulardii  250 mg Oral BID  . sodium chloride flush  3 mL Intravenous Q12H  . ticagrelor  60 mg Oral BID   Continuous Infusions: . sodium chloride 100 mL (12/04/16 1035)   PRN Meds: acetaminophen **OR** acetaminophen, ALPRAZolam, loperamide, nitroGLYCERIN, ondansetron **OR** ondansetron (ZOFRAN) IV, polyethylene glycol, sodium chloride, zolpidem  Time spent: 30 minutes  Author: Berle Mull, MD [facility] Pager: [phone] 12/04/2016 12:40 PM  If 7PM-7AM, please contact night-coverage at [email or web address], password [facility]

## 2016-12-05 DIAGNOSIS — S022XXD Fracture of nasal bones, subsequent encounter for fracture with routine healing: Secondary | ICD-10-CM

## 2016-12-05 DIAGNOSIS — I2510 Atherosclerotic heart disease of native coronary artery without angina pectoris: Secondary | ICD-10-CM

## 2016-12-05 DIAGNOSIS — R338 Other retention of urine: Secondary | ICD-10-CM | POA: Diagnosis present

## 2016-12-05 DIAGNOSIS — A09 Infectious gastroenteritis and colitis, unspecified: Secondary | ICD-10-CM

## 2016-12-05 DIAGNOSIS — N3281 Overactive bladder: Secondary | ICD-10-CM

## 2016-12-05 DIAGNOSIS — I429 Cardiomyopathy, unspecified: Secondary | ICD-10-CM

## 2016-12-05 DIAGNOSIS — W19XXXD Unspecified fall, subsequent encounter: Secondary | ICD-10-CM

## 2016-12-05 DIAGNOSIS — I491 Atrial premature depolarization: Secondary | ICD-10-CM

## 2016-12-05 DIAGNOSIS — E272 Addisonian crisis: Secondary | ICD-10-CM

## 2016-12-05 DIAGNOSIS — Z87898 Personal history of other specified conditions: Secondary | ICD-10-CM

## 2016-12-05 DIAGNOSIS — N401 Enlarged prostate with lower urinary tract symptoms: Secondary | ICD-10-CM | POA: Diagnosis present

## 2016-12-05 DIAGNOSIS — E871 Hypo-osmolality and hyponatremia: Secondary | ICD-10-CM

## 2016-12-05 LAB — CBC WITH DIFFERENTIAL/PLATELET
BASOS ABS: 0 10*3/uL (ref 0.0–0.1)
BASOS PCT: 0 %
EOS ABS: 0.1 10*3/uL (ref 0.0–0.7)
Eosinophils Relative: 1 %
HEMATOCRIT: 35.8 % — AB (ref 39.0–52.0)
HEMOGLOBIN: 12.3 g/dL — AB (ref 13.0–17.0)
Lymphocytes Relative: 14 %
Lymphs Abs: 1.4 10*3/uL (ref 0.7–4.0)
MCH: 30.7 pg (ref 26.0–34.0)
MCHC: 34.4 g/dL (ref 30.0–36.0)
MCV: 89.3 fL (ref 78.0–100.0)
Monocytes Absolute: 0.7 10*3/uL (ref 0.1–1.0)
Monocytes Relative: 7 %
NEUTROS ABS: 7.6 10*3/uL (ref 1.7–7.7)
NEUTROS PCT: 78 %
Platelets: 204 10*3/uL (ref 150–400)
RBC: 4.01 MIL/uL — ABNORMAL LOW (ref 4.22–5.81)
RDW: 13.7 % (ref 11.5–15.5)
WBC: 9.9 10*3/uL (ref 4.0–10.5)

## 2016-12-05 LAB — COMPREHENSIVE METABOLIC PANEL
ALBUMIN: 2.7 g/dL — AB (ref 3.5–5.0)
ALK PHOS: 53 U/L (ref 38–126)
ALT: 18 U/L (ref 17–63)
ANION GAP: 7 (ref 5–15)
AST: 21 U/L (ref 15–41)
BILIRUBIN TOTAL: 0.8 mg/dL (ref 0.3–1.2)
BUN: 17 mg/dL (ref 6–20)
CALCIUM: 8.4 mg/dL — AB (ref 8.9–10.3)
CO2: 21 mmol/L — ABNORMAL LOW (ref 22–32)
CREATININE: 1.05 mg/dL (ref 0.61–1.24)
Chloride: 109 mmol/L (ref 101–111)
GFR calc Af Amer: >60 mL/min (ref >60)
GFR calc non Af Amer: >60 mL/min (ref >60)
Glucose, Bld: 107 mg/dL — ABNORMAL HIGH (ref 65–99)
POTASSIUM: 3.7 mmol/L (ref 3.5–5.1)
SODIUM: 137 mmol/L (ref 135–145)
Total Protein: 4.9 g/dL — ABNORMAL LOW (ref 6.5–8.1)

## 2016-12-05 LAB — MAGNESIUM: MAGNESIUM: 1.9 mg/dL (ref 1.7–2.4)

## 2016-12-05 MED ORDER — DARIFENACIN HYDROBROMIDE ER 15 MG PO TB24
15.0000 mg | ORAL_TABLET | Freq: Every day | ORAL | Status: DC
  Administered 2016-12-05 – 2016-12-06 (×2): 15 mg via ORAL
  Filled 2016-12-05 (×2): qty 1

## 2016-12-05 MED ORDER — MIRABEGRON ER 50 MG PO TB24
50.0000 mg | ORAL_TABLET | Freq: Every day | ORAL | Status: DC
  Administered 2016-12-05 – 2016-12-06 (×2): 50 mg via ORAL
  Filled 2016-12-05 (×2): qty 1

## 2016-12-05 MED ORDER — POTASSIUM CHLORIDE IN NACL 20-0.9 MEQ/L-% IV SOLN
INTRAVENOUS | Status: DC
  Filled 2016-12-05: qty 1000

## 2016-12-05 NOTE — Progress Notes (Signed)
RN notified by on CCMD that pt. Having multiple PACs on tele this am. Pt. Alert and stable. No s/s of distress or discomfort noted. On coming RN made aware. On call for [facility] made aware via [place], team 11.

## 2016-12-05 NOTE — Progress Notes (Signed)
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    PT Comments    Pt's BP's falling more into line.  Although not fully stable at all times with gait/mobility, his stability has improved significantly.  Pt still needs to stress cardio and core stability work.  He can do that at [facility] and transition to his personal gym.    Follow Up Recommendations  Outpatient PT     Equipment Recommendations  None recommended by PT    Recommendations for Other Services       Precautions / Restrictions Precautions Precautions: Fall Restrictions Weight Bearing Restrictions: No    Mobility  Bed Mobility Overal bed mobility: Needs Assistance                Transfers Overall transfer level: Needs assistance Equipment used: Rolling walker (2 wheeled);None Transfers: Sit to/from Stand Sit to Stand: Supervision         General transfer comment: pt steady, no assist needs  Ambulation/Gait Ambulation/Gait assistance: Supervision Ambulation Distance (Feet): 100 Feet (then additional 260 feet, no assistive device) Assistive device: Rolling walker (2 wheeled);None Gait Pattern/deviations: Step-through pattern;Trunk flexed   Gait velocity interpretation: at or above normal speed for age/gender General Gait Details: very mild instability without AD, somewhat due to flexed posture and some distractability   Stairs            Wheelchair Mobility    Modified Rankin (Stroke Patients Only)       Balance Overall balance assessment: Needs assistance   Sitting balance-Leahy Scale: Good       Standing balance-Leahy Scale: Fair Standing balance  comment: AD not necessary today.                    Cognition Arousal/Alertness: Awake/alert Behavior During Therapy: WFL for tasks assessed/performed Overall Cognitive Status: Within Functional Limits for tasks assessed         Following Commands: Follows one step commands consistently            Exercises      General Comments General comments (skin integrity, edema, etc.): Standing BP130/70 (86);       Pertinent Vitals/Pain Pain Assessment: Faces Faces Pain Scale: Hurts a little bit Pain Location: nose Pain Descriptors / Indicators: Aching Pain Intervention(s): Monitored during session    Home Living                      Prior Function            PT Goals (current goals can now be found in the care plan section) Acute Rehab PT Goals Patient Stated Goal: return home PT Goal Formulation: With patient/family Time For Goal Achievement: 12/17/16 Potential to Achieve Goals: Good Progress towards PT goals: Progressing toward goals    Frequency    Min 3X/week      PT Plan Discharge plan needs to be updated    Co-evaluation             End of Session   Activity Tolerance: Patient tolerated treatment well Patient left: in chair;with call bell/phone within reach;with chair alarm set;with family/visitor present Nurse Communication: Mobility status  PT Visit Diagnosis: Unsteadiness on feet (R26.81);Muscle weakness (generalized) (M62.81);History of falling (Z91.81)     Time: 1014-1100 PT Time Calculation (min) (ACUTE ONLY): 46 min  Charges:  $Gait Training: 8-22 mins $Therapeutic Exercise: 8-22 mins $Therapeutic Activity: 8-22 mins                    G CodesTessie Fass Chenika Nevils 12/05/2016, 11:23 AM 12/05/2016  Donnella Sham, [place] [phone]  (pager)

## 2016-12-05 NOTE — Progress Notes (Signed)
Noted pt at supervision level with therapy today and Outpt therapy is recommended. We will sign off. [phone]

## 2016-12-05 NOTE — Progress Notes (Signed)
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 On admission, the patient was found to have acute kidney injury and significant orthostasis.  Assessment/Plan:   Principal Problem:   Syncope/Falls/orthostatic hypotension Likely from orthostatic hypotension and adrenal insufficiency in the setting of acute viral gastroenteritis. Patient is on stress dose hydrocortisone. Continue to monitor orthostatics. PT/OT Following. Initially CIR recommended, but the patient has improved dramatically and will be referred to outpatient PT at discharge. No longer orthostatic.  Active Problems:   HYPERCHOLESTEROLEMIA  IIA Takes red yeast rice and omega-3 supplements at home.    Diarrhea Secondary to acute viral gastroenteritis. Wife recently had similar symptoms. Supportive care. C. difficile PCR negative. Diarrhea appears to have resolved.    Nasal fracture Nondisplaced. No intervention needed. Secondary to fall. Continue saline nasal spray.    AKI (acute kidney injury) ([place]) Secondary to dehydration in the setting of diarrhea. Renal function improved with hydration.    Right bundle branch block/Coronary artery disease involving native coronary artery of native heart without angina pectoris/History of ST elevation myocardial infarction (STEMI)/cardiomyopathy Continue Brilinta,  Coreg and aspirin.    H/O: pituitary tumor On chronic hydrocortisone. Currently on stress dose steroids, which we will wean as tolerated. Continue PPI for GI prophylaxis.    Chronic bilateral low back pain without sciatica Supportive care.    Hyponatremia Likely reflective of acute adrenal insufficiency.    Leukocytosis WBC count normalized.    Acute adrenal insufficiency (HCC) Patient is on chronic steroids status post pituitary resection. Stress dose steroids initiated 12/04/16.    Premature atrial contractions Chronic problem. We'll discontinue telemetry.    Urinary retention/overactive bladder/BPH Resume home medications and discontinue Foley. If he is unable to void, will likely need to discharge home with catheter and outpatient neurology follow-up.   Family Communication/Anticipated D/C date and plan/Code Status   DVT prophylaxis: On Brilinta. Code Status: Full Code.  Family Communication: Wife updated at bedside. Disposition Plan: Likely home 12/06/16.   Medical Consultants:    Physical Medicine and Rehabilitation   Procedures:    None  Anti-Infectives:    None  Subjective:   [name] Is doing much better. He feels better since starting on the stress dose steroids, and whereas he could barely stand up 2 days ago, he is now ambulating without an assistive device, although he is slightly unsteady on his feet. No further complaints of diarrhea. Has not moved his bowels at least 24 hours. No nausea or vomiting.  Objective:    Vitals:   12/04/16 2008 12/05/16 0522 12/05/16 0533 12/05/16 1110  BP: 122/65 105/62  120/65  Pulse: 67 (!) 53  (!) 52  Resp: 20 18  18   Temp: 98.3 F (36.8 C) 97.5 F (36.4 C)  97.3 F (36.3 C)  TempSrc: Oral Oral  Oral  SpO2: 98% 97%  96%  Weight:   103.2 kg (227 lb 8 oz)   Height:  Intake/Output Summary (Last 24 hours) at 12/05/16 1520 Last data filed at 12/05/16 1500  Gross per 24 hour  Intake             2415 ml   Output             1825 ml  Net              590 ml   Filed Weights   12/03/16 0715 12/04/16 0659 12/05/16 0533  Weight: 93.7 kg (206 lb 9.1 oz) 99.7 kg (219 lb 14.4 oz) 103.2 kg (227 lb 8 oz)    Exam: General exam: Appears calm and comfortable.  Respiratory system: Clear to auscultation. Respiratory effort normal. Cardiovascular system: S1 & S2 heard, RRR. No JVD,  rubs, gallops or clicks. No murmurs. Gastrointestinal system: Abdomen is nondistended, soft and nontender. No organomegaly or masses felt. Normal bowel sounds heard. Central nervous system: Alert and oriented x 2. No focal neurological deficits. Extremities: No clubbing,  or cyanosis. No edema. Skin: No rashes, lesions or ulcers. Psychiatry: Judgement and insight appear fair. Mood & affect appropriate.   Data Reviewed:   I have personally reviewed following labs and imaging studies:  Labs: Basic Metabolic Panel:  Recent Labs Lab 12/02/16 1012 12/03/16 0524 12/05/16 0649  NA 135 134* 137  K 4.3 4.0 3.7  CL 102 105 109  CO2 25 22 21*  GLUCOSE 102* 90 107*  BUN 20 17 17   CREATININE 1.35* 1.17 1.05  CALCIUM 9.1 8.7* 8.4*  MG  --   --  1.9   GFR Estimated Creatinine Clearance: 68.9 mL/min (by C-G formula based on SCr of 1.05 mg/dL). Liver Function Tests:  Recent Labs Lab 12/05/16 0649  AST 21  ALT 18  ALKPHOS 53  BILITOT 0.8  PROT 4.9*  ALBUMIN 2.7*   Coagulation profile  Recent Labs Lab 12/02/16 1012  INR 1.19    CBC:  Recent Labs Lab 12/02/16 1012 12/03/16 0524 12/05/16 0649  WBC 12.3* 13.8* 9.9  NEUTROABS 9.6*  --  7.6  HGB 15.0 15.3 12.3*  HCT 43.4 44.6 35.8*  MCV 90.2 90.1 89.3  PLT 195 176 204   Cardiac Enzymes:  Recent Labs Lab 12/02/16 1813  TROPONINI <0.03    Microbiology Recent Results (from the past 240 hour(s))  Urine culture     Status: Abnormal   Collection Time: 12/02/16  1:14 PM  Result Value Ref Range Status   Specimen Description Urine  Final    Special Requests NONE  Final   Culture MULTIPLE SPECIES PRESENT, SUGGEST RECOLLECTION (A)  Final   Report Status 12/03/2016 FINAL  Final  C difficile quick scan w PCR reflex     Status: None   Collection Time: 12/03/16 12:10 AM  Result Value Ref Range Status   C Diff antigen NEGATIVE NEGATIVE Final   C Diff toxin NEGATIVE NEGATIVE Final   C Diff interpretation No C. difficile detected.  Final    Radiology: No results found.  Medications:   . alfuzosin  10 mg Oral Q breakfast  . aspirin  81 mg Oral Daily  . darifenacin  15 mg Oral Daily  . finasteride  5 mg Oral Daily  . hydrocortisone sod succinate (SOLU-CORTEF) inj  50 mg Intravenous Q12H  . levothyroxine  150 mcg Oral QAC breakfast  . mirabegron ER  50 mg Oral Daily  . omega-3 acid ethyl esters  1 g Oral BID  . pantoprazole  80 mg Oral Daily  .  saccharomyces boulardii  250 mg Oral BID  . sodium chloride flush  3 mL Intravenous Q12H  . ticagrelor  60 mg Oral BID   Continuous Infusions:   Medical decision making is moderately complex therefore this is a level 2 visit. (> 3 problem points, 1 data point, moderate risk)    LOS: 2 days   RAMA,CHRISTINA  [facility] Pager [phone]. If unable to reach me by pager, please call my cell phone at [phone].  *Please refer to [email or web address], password [email or web address] to get updated schedule on who will round on this patient, as hospitalists switch teams weekly. If 7PM-7AM, please contact night-coverage at [email or web address], password [email or web address] for any overnight needs.  12/05/2016, 3:20 PM

## 2016-12-05 NOTE — Progress Notes (Signed)
RN informed by lab tech that pt. Refusing morning labs at this time. Pt. Stated he did not understand why he needed "a shot". RN explained to him the reason for labs being drawn. Pt. Agrees to have lab tech attempt to collect labs.

## 2016-12-06 DIAGNOSIS — N390 Urinary tract infection, site not specified: Secondary | ICD-10-CM | POA: Diagnosis not present

## 2016-12-06 DIAGNOSIS — N401 Enlarged prostate with lower urinary tract symptoms: Secondary | ICD-10-CM | POA: Diagnosis not present

## 2016-12-06 DIAGNOSIS — I2510 Atherosclerotic heart disease of native coronary artery without angina pectoris: Secondary | ICD-10-CM | POA: Diagnosis not present

## 2016-12-06 DIAGNOSIS — N179 Acute kidney failure, unspecified: Secondary | ICD-10-CM | POA: Diagnosis not present

## 2016-12-06 DIAGNOSIS — A084 Viral intestinal infection, unspecified: Secondary | ICD-10-CM | POA: Diagnosis not present

## 2016-12-06 DIAGNOSIS — E871 Hypo-osmolality and hyponatremia: Secondary | ICD-10-CM | POA: Diagnosis not present

## 2016-12-06 DIAGNOSIS — E784 Other hyperlipidemia: Secondary | ICD-10-CM | POA: Diagnosis not present

## 2016-12-06 DIAGNOSIS — I951 Orthostatic hypotension: Secondary | ICD-10-CM

## 2016-12-06 DIAGNOSIS — E2740 Unspecified adrenocortical insufficiency: Secondary | ICD-10-CM | POA: Diagnosis not present

## 2016-12-06 DIAGNOSIS — Z6830 Body mass index (BMI) 30.0-30.9, adult: Secondary | ICD-10-CM | POA: Diagnosis not present

## 2016-12-06 DIAGNOSIS — R040 Epistaxis: Secondary | ICD-10-CM | POA: Diagnosis not present

## 2016-12-06 DIAGNOSIS — N3281 Overactive bladder: Secondary | ICD-10-CM

## 2016-12-06 DIAGNOSIS — I491 Atrial premature depolarization: Secondary | ICD-10-CM | POA: Diagnosis not present

## 2016-12-06 MED ORDER — ALFUZOSIN HCL ER 10 MG PO TB24
10.0000 mg | ORAL_TABLET | Freq: Every day | ORAL | Status: DC
  Administered 2016-12-06: 10 mg via ORAL

## 2016-12-06 MED ORDER — LEVOTHYROXINE SODIUM 75 MCG PO TABS
150.0000 ug | ORAL_TABLET | Freq: Every day | ORAL | Status: DC
  Administered 2016-12-06: 150 ug via ORAL
  Filled 2016-12-06: qty 2

## 2016-12-06 MED ORDER — ALFUZOSIN HCL ER 10 MG PO TB24: 10.0000 mg | ORAL_TABLET | Freq: Every day | ORAL | 2 refills | Status: DC

## 2016-12-06 NOTE — Progress Notes (Signed)
CM talked to patient about Outpatient Physial Therapy with spouse at the bedside. Spouse requested [facility] at [facility]; referral made as requested; Outpatient rehab is to contact the patient/ spouse for start up date and time for therapy; Aneta Mins [phone]

## 2016-12-06 NOTE — Discharge Summary (Signed)
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 F/U with PCP in 1 week to reinforce teaching about how to manage adrenal insufficiency during illness.   Discharge Diagnosis:   Principal Problem:    Syncope secondary to adrenal insufficiency in the setting of acute viral gastroenteritis Active Problems:    HYPERCHOLESTEROLEMIA  IIA    Cardiomyopathy ([place])    PAC (premature atrial contraction)    Falls    Diarrhea    Nasal fracture    AKI (acute kidney injury) ([place])    Right bundle branch block    Orthostatic hypotension    Coronary artery disease involving native coronary artery of native heart without angina pectoris    History of ST elevation myocardial infarction (STEMI)    H/O: pituitary tumor    Chronic bilateral low back pain without sciatica    Hyponatremia    Leukocytosis    Acute adrenal insufficiency (HCC)    Urinary retention due to benign prostatic hyperplasia    Overactive bladder   Discharge Condition: Improved.  Diet recommendation: Low sodium, heart healthy.    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 On admission, the patient was found to have acute kidney injury and significant orthostasis.   Hospital Course by Problem:   Principal Problem:   Syncope/Falls/orthostatic hypotension due to adrenal insufficiency in the  setting of viral gastroenteritis Syncope was likely from orthostatic hypotension and adrenal insufficiency in the setting of acute viral gastroenteritis. Patient dramatically improved on stress dose hydrocortisone with resolution of orthostatic hypotension. PT/OT initially recommended CIR, but the patient has improved dramatically and will be referred to outpatient PT at discharge. Instructed about how to increase his hydrocortisone to stress doses when dealing with an acute illness.  Active Problems:   HYPERCHOLESTEROLEMIA  IIA Takes red yeast rice and omega-3 supplements at home.    Diarrhea Secondary to acute viral gastroenteritis. Wife recently had similar symptoms. Supportive care. C. difficile PCR negative. Diarrhea resolved.    Nasal fracture Nondisplaced. No intervention needed. Secondary to fall. Continue saline nasal spray.    AKI (acute kidney injury) ([place]) Secondary to dehydration in the setting of diarrhea. Renal function improved with hydration.    Right bundle branch block/Coronary artery disease involving native coronary artery of native heart without angina pectoris/History of ST elevation myocardial infarction (STEMI)/cardiomyopathy Continue Brilinta, Coreg and aspirin.    H/O: pituitary tumor On chronic hydrocortisone. Currently on stress dose steroids, which we will wean as tolerated. Continue PPI for GI prophylaxis.    Chronic bilateral low back pain without sciatica Supportive care.    Hyponatremia Likely reflective of acute adrenal insufficiency. Resolved.    Leukocytosis WBC count normalized.    Acute adrenal insufficiency (HCC) Patient is on chronic steroids status post pituitary resection. Stress dose steroids initiated 12/04/16. OK to resume maintenance dose at D/C.    Premature atrial contractions Chronic problem.     Urinary retention/overactive bladder/BPH Able to void well  at discharge with adjustment of his usual  medications.   Medical Consultants:    None.   Discharge Exam:   Vitals:   12/05/16 2101 12/06/16 0455  BP: (!) 119/59 (!) 124/58  Pulse: 60 91  Resp: 18 17  Temp: 97.3 F (36.3 C) 98 F (36.7 C)   Vitals:   12/05/16 0533 12/05/16 1110 12/05/16 2101 12/06/16 0455  BP:  120/65 (!) 119/59 (!) 124/58  Pulse:  (!) 52 60 91  Resp:  18 18 17   Temp:  97.3 F (36.3 C) 97.3 F (36.3 C) 98 F (36.7 C)  TempSrc:  Oral Oral Oral  SpO2:  96% 98% 97%  Weight: 103.2 kg (227 lb 8 oz)   102.7 kg (226 lb 6.4 oz)  Height:       General exam: Appears calm and comfortable. Mild facial trauma noted. Respiratory system: Clear to auscultation. Respiratory effort normal. Cardiovascular system: S1 & S2 heard, RRR. No JVD,  rubs, gallops or clicks. No murmurs. Gastrointestinal system: Abdomen is nondistended, soft and nontender. No organomegaly or masses felt. Normal bowel sounds heard. Central nervous system: Alert and oriented x 3. No focal neurological deficits. Extremities: No clubbing,  or cyanosis. No edema. Skin: No rashes, lesions or ulcers. Psychiatry: Judgement and insight appear fair. Mood & affect appropriate.    The results of significant diagnostics from this hospitalization (including imaging, microbiology, ancillary and laboratory) are listed below for reference.     Procedures and Diagnostic Studies:   Ct Abdomen Pelvis Wo Contrast  Result Date: 12/02/2016 CLINICAL DATA:  Fall, incontinence. EXAM: CT ABDOMEN AND PELVIS WITHOUT CONTRAST TECHNIQUE: Multidetector CT imaging of the abdomen and pelvis was performed following the standard protocol without IV contrast. COMPARISON:  None. FINDINGS: Lower chest: No acute abnormality. Hepatobiliary: Layering stones within the otherwise normal-appearing gallbladder. No focal abnormality identified within the liver. No bile duct dilatation. Pancreas: Unremarkable. No pancreatic ductal dilatation or surrounding inflammatory changes.  Spleen: Normal in size without focal abnormality. Adrenals/Urinary Tract: Adrenal glands appear normal. Large right renal cyst. Small left renal cyst. No renal stone or hydronephrosis bilaterally. No ureteral or bladder calculi identified. Stomach/Bowel: Bowel is normal in caliber. No bowel wall thickening or evidence of bowel wall inflammation seen. Fluid is present within the large and small bowel compatible with history of diarrhea. Vascular/Lymphatic: Aortic atherosclerosis. No enlarged lymph nodes seen. Reproductive: Unremarkable. Other: No free fluid or abscess collection. No free intraperitoneal air. Musculoskeletal: Degenerative changes throughout the thoracolumbar spine, at least moderate in degree. No acute or suspicious osseous finding. IMPRESSION: 1. Fluid throughout the majority of the nondistended large bowel, compatible with history of diarrhea. No evidence of focal bowel wall thickening or inflammation. I cannot exclude a mild diffuse colitis. 2. Cholelithiasis without evidence of acute cholecystitis. 3. Aortic atherosclerosis. 4. Additional chronic/incidental findings detailed above. Electronically Signed   By: Franki Cabot M.D.   On: 12/02/2016 18:25   Dg Chest 2 View  Result Date: 12/02/2016 CLINICAL DATA:  Syncope. EXAM: CHEST  2 VIEW COMPARISON:  Radiographs of July 10, 2016. FINDINGS: The heart size and mediastinal contours are within normal limits. Both lungs are clear. No pneumothorax or pleural effusion is noted. The visualized skeletal structures are unremarkable. IMPRESSION: No active cardiopulmonary disease. Electronically Signed   By: Marijo Conception, M.D.   On: 12/02/2016 11:40   Ct Head Wo Contrast  Result Date: 12/02/2016 CLINICAL DATA:  Syncopal episode this morning. Fall. Facial injury. Initial encounter. EXAM: CT HEAD  WITHOUT CONTRAST CT MAXILLOFACIAL WITHOUT CONTRAST CT CERVICAL SPINE WITHOUT CONTRAST TECHNIQUE: Multidetector CT imaging of the head, cervical spine, and  maxillofacial structures were performed using the standard protocol without intravenous contrast. Multiplanar CT image reconstructions of the cervical spine and maxillofacial structures were also generated. COMPARISON:  Brain MRI 02/10/2014. Head CT 12/02/2009. Cervical spine radiographs 05/14/2007. FINDINGS: CT HEAD FINDINGS Brain: Mega cisterna magna is unchanged, a normal variant. There is mild cerebral atrophy. Patchy hypodensities in the subcortical and periventricular cerebral white matter bilaterally are nonspecific but compatible with mild chronic small vessel ischemic disease. There is no evidence of acute cortical infarct, intracranial hemorrhage, mass, midline shift, or extra-axial fluid collection. Postoperative changes are again noted related to prior trans-sphenoidal pituitary tumor resection, with small volume nodular tissue extending into the sphenoid sinus better evaluated on the prior MRI. Vascular: No hyperdense vessel or unexpected calcification. Skull: No skull fracture. Other: None. CT MAXILLOFACIAL FINDINGS Osseous: There is a nondisplaced nasal bone fracture anteriorly with minimal angulation and surrounding soft tissue swelling/hematoma. No additional maxillofacial fractures are identified. Orbits: Prior bilateral cataract extraction.  No orbital hematoma. Sinuses: Postsurgical changes from trans-sphenoidal pituitary mass resection as above. Hypoplastic frontal sinuses. Minimal right ethmoid and right maxillary sinus mucosal thickening. Trace right mastoid effusion. Small nasal septal perforation. Blood in the anterior nasal cavity, left greater than right. Soft tissues: Otherwise negative. CT CERVICAL SPINE FINDINGS Alignment: Cervical spine straightening. Minimal anterolisthesis of C3 on C4. Skull base and vertebrae: No evidence of acute fracture or destructive osseous process. Soft tissues and spinal canal: No prevertebral fluid or swelling. No visible canal hematoma. Disc levels: Diffuse  cervical spondylosis. Severe disc space narrowing at C5-6 with milder narrowing at C4-5 and C6-7. Moderate diffuse facet arthrosis. Left facet ankylosis at C2-3. Mild calcified pannus posterior to the dens. Upper chest: Unremarkable. Other: Retropharyngeal course of the proximal right ICA. IMPRESSION: 1. No evidence of acute intracranial abnormality. 2. Mild chronic small vessel ischemic disease and postsurgical changes from pituitary adenoma resection as above. 3. Nondisplaced nasal bone fracture. 4. No evidence of acute cervical spine fracture or traumatic subluxation. Electronically Signed   By: Logan Bores M.D.   On: 12/02/2016 11:16   Ct Cervical Spine Wo Contrast  Result Date: 12/02/2016 CLINICAL DATA:  Syncopal episode this morning. Fall. Facial injury. Initial encounter. EXAM: CT HEAD WITHOUT CONTRAST CT MAXILLOFACIAL WITHOUT CONTRAST CT CERVICAL SPINE WITHOUT CONTRAST TECHNIQUE: Multidetector CT imaging of the head, cervical spine, and maxillofacial structures were performed using the standard protocol without intravenous contrast. Multiplanar CT image reconstructions of the cervical spine and maxillofacial structures were also generated. COMPARISON:  Brain MRI 02/10/2014. Head CT 12/02/2009. Cervical spine radiographs 05/14/2007. FINDINGS: CT HEAD FINDINGS Brain: Mega cisterna magna is unchanged, a normal variant. There is mild cerebral atrophy. Patchy hypodensities in the subcortical and periventricular cerebral white matter bilaterally are nonspecific but compatible with mild chronic small vessel ischemic disease. There is no evidence of acute cortical infarct, intracranial hemorrhage, mass, midline shift, or extra-axial fluid collection. Postoperative changes are again noted related to prior trans-sphenoidal pituitary tumor resection, with small volume nodular tissue extending into the sphenoid sinus better evaluated on the prior MRI. Vascular: No hyperdense vessel or unexpected calcification.  Skull: No skull fracture. Other: None. CT MAXILLOFACIAL FINDINGS Osseous: There is a nondisplaced nasal bone fracture anteriorly with minimal angulation and surrounding soft tissue swelling/hematoma. No additional maxillofacial fractures are identified. Orbits: Prior bilateral cataract extraction.  No orbital hematoma. Sinuses: Postsurgical changes from trans-sphenoidal  pituitary mass resection as above. Hypoplastic frontal sinuses. Minimal right ethmoid and right maxillary sinus mucosal thickening. Trace right mastoid effusion. Small nasal septal perforation. Blood in the anterior nasal cavity, left greater than right. Soft tissues: Otherwise negative. CT CERVICAL SPINE FINDINGS Alignment: Cervical spine straightening. Minimal anterolisthesis of C3 on C4. Skull base and vertebrae: No evidence of acute fracture or destructive osseous process. Soft tissues and spinal canal: No prevertebral fluid or swelling. No visible canal hematoma. Disc levels: Diffuse cervical spondylosis. Severe disc space narrowing at C5-6 with milder narrowing at C4-5 and C6-7. Moderate diffuse facet arthrosis. Left facet ankylosis at C2-3. Mild calcified pannus posterior to the dens. Upper chest: Unremarkable. Other: Retropharyngeal course of the proximal right ICA. IMPRESSION: 1. No evidence of acute intracranial abnormality. 2. Mild chronic small vessel ischemic disease and postsurgical changes from pituitary adenoma resection as above. 3. Nondisplaced nasal bone fracture. 4. No evidence of acute cervical spine fracture or traumatic subluxation. Electronically Signed   By: Logan Bores M.D.   On: 12/02/2016 11:16   Ct Maxillofacial Wo Contrast  Result Date: 12/02/2016 CLINICAL DATA:  Syncopal episode this morning. Fall. Facial injury. Initial encounter. EXAM: CT HEAD WITHOUT CONTRAST CT MAXILLOFACIAL WITHOUT CONTRAST CT CERVICAL SPINE WITHOUT CONTRAST TECHNIQUE: Multidetector CT imaging of the head, cervical spine, and maxillofacial  structures were performed using the standard protocol without intravenous contrast. Multiplanar CT image reconstructions of the cervical spine and maxillofacial structures were also generated. COMPARISON:  Brain MRI 02/10/2014. Head CT 12/02/2009. Cervical spine radiographs 05/14/2007. FINDINGS: CT HEAD FINDINGS Brain: Mega cisterna magna is unchanged, a normal variant. There is mild cerebral atrophy. Patchy hypodensities in the subcortical and periventricular cerebral white matter bilaterally are nonspecific but compatible with mild chronic small vessel ischemic disease. There is no evidence of acute cortical infarct, intracranial hemorrhage, mass, midline shift, or extra-axial fluid collection. Postoperative changes are again noted related to prior trans-sphenoidal pituitary tumor resection, with small volume nodular tissue extending into the sphenoid sinus better evaluated on the prior MRI. Vascular: No hyperdense vessel or unexpected calcification. Skull: No skull fracture. Other: None. CT MAXILLOFACIAL FINDINGS Osseous: There is a nondisplaced nasal bone fracture anteriorly with minimal angulation and surrounding soft tissue swelling/hematoma. No additional maxillofacial fractures are identified. Orbits: Prior bilateral cataract extraction.  No orbital hematoma. Sinuses: Postsurgical changes from trans-sphenoidal pituitary mass resection as above. Hypoplastic frontal sinuses. Minimal right ethmoid and right maxillary sinus mucosal thickening. Trace right mastoid effusion. Small nasal septal perforation. Blood in the anterior nasal cavity, left greater than right. Soft tissues: Otherwise negative. CT CERVICAL SPINE FINDINGS Alignment: Cervical spine straightening. Minimal anterolisthesis of C3 on C4. Skull base and vertebrae: No evidence of acute fracture or destructive osseous process. Soft tissues and spinal canal: No prevertebral fluid or swelling. No visible canal hematoma. Disc levels: Diffuse cervical  spondylosis. Severe disc space narrowing at C5-6 with milder narrowing at C4-5 and C6-7. Moderate diffuse facet arthrosis. Left facet ankylosis at C2-3. Mild calcified pannus posterior to the dens. Upper chest: Unremarkable. Other: Retropharyngeal course of the proximal right ICA. IMPRESSION: 1. No evidence of acute intracranial abnormality. 2. Mild chronic small vessel ischemic disease and postsurgical changes from pituitary adenoma resection as above. 3. Nondisplaced nasal bone fracture. 4. No evidence of acute cervical spine fracture or traumatic subluxation. Electronically Signed   By: Logan Bores M.D.   On: 12/02/2016 11:16     Labs:   Basic Metabolic Panel:  Recent Labs Lab 12/02/16 1012 12/03/16 0524 12/05/16  0649  NA 135 134* 137  K 4.3 4.0 3.7  CL 102 105 109  CO2 25 22 21*  GLUCOSE 102* 90 107*  BUN 20 17 17   CREATININE 1.35* 1.17 1.05  CALCIUM 9.1 8.7* 8.4*  MG  --   --  1.9   GFR Estimated Creatinine Clearance: 68.9 mL/min (by C-G formula based on SCr of 1.05 mg/dL). Liver Function Tests:  Recent Labs Lab 12/05/16 0649  AST 21  ALT 18  ALKPHOS 53  BILITOT 0.8  PROT 4.9*  ALBUMIN 2.7*   No results for input(s): LIPASE, AMYLASE in the last 168 hours. No results for input(s): AMMONIA in the last 168 hours. Coagulation profile  Recent Labs Lab 12/02/16 1012  INR 1.19    CBC:  Recent Labs Lab 12/02/16 1012 12/03/16 0524 12/05/16 0649  WBC 12.3* 13.8* 9.9  NEUTROABS 9.6*  --  7.6  HGB 15.0 15.3 12.3*  HCT 43.4 44.6 35.8*  MCV 90.2 90.1 89.3  PLT 195 176 204   Cardiac Enzymes:  Recent Labs Lab 12/02/16 1813  TROPONINI <0.03   BNP: Invalid input(s): POCBNP CBG: No results for input(s): GLUCAP in the last 168 hours. D-Dimer No results for input(s): DDIMER in the last 72 hours. Hgb A1c No results for input(s): HGBA1C in the last 72 hours. Lipid Profile No results for input(s): CHOL, HDL, LDLCALC, TRIG, CHOLHDL, LDLDIRECT in the last 72  hours. Thyroid function studies No results for input(s): TSH, T4TOTAL, T3FREE, THYROIDAB in the last 72 hours.  Invalid input(s): FREET3 Anemia work up No results for input(s): VITAMINB12, FOLATE, FERRITIN, TIBC, IRON, RETICCTPCT in the last 72 hours. Microbiology Recent Results (from the past 240 hour(s))  Urine culture     Status: Abnormal   Collection Time: 12/02/16  1:14 PM  Result Value Ref Range Status   Specimen Description Urine  Final   Special Requests NONE  Final   Culture MULTIPLE SPECIES PRESENT, SUGGEST RECOLLECTION (A)  Final   Report Status 12/03/2016 FINAL  Final  C difficile quick scan w PCR reflex     Status: None   Collection Time: 12/03/16 12:10 AM  Result Value Ref Range Status   C Diff antigen NEGATIVE NEGATIVE Final   C Diff toxin NEGATIVE NEGATIVE Final   C Diff interpretation No C. difficile detected.  Final     Discharge Instructions:   Discharge Instructions    Call MD for:  extreme fatigue    Complete by:  As directed    Call MD for:  persistant dizziness or light-headedness    Complete by:  As directed    Call MD for:  persistant nausea and vomiting    Complete by:  As directed    Call MD for:  severe uncontrolled pain    Complete by:  As directed    Call MD for:  temperature >100.4    Complete by:  As directed    Diet - low sodium heart healthy    Complete by:  As directed    Discharge instructions    Complete by:  As directed    For mild illness you must give yourself "stress dose" steroids using the "3x3" rule.  Triple the dose of your usual amount of hydrocortisone for 3 days. So instead of taking 15 mg of hydrocortisone, you'd take 45 mg for 3 days at the start of any illness such as the flu or a GI illness.   Increase activity slowly    Complete by:  As directed      Allergies as of 12/06/2016      Reactions   Sulfamethoxazole Anaphylaxis, Swelling   Bee Venom Rash   Sulfa Antibiotics Nausea And Vomiting   Sulfonamide Derivatives  Nausea And Vomiting   Tamsulosin    Unknown pt doesn't think he is allergic       Medication List    STOP taking these medications   tolterodine 4 MG 24 hr capsule Commonly known as:  DETROL LA     TAKE these medications   alfuzosin 10 MG 24 hr tablet Commonly known as:  UROXATRAL Take 1 tablet (10 mg total) by mouth daily with breakfast. Start taking on:  12/07/2016   ALPRAZolam 0.5 MG tablet Commonly known as:  XANAX Take 0.5 mg by mouth as needed for anxiety.   aspirin 81 MG tablet Take 81 mg by mouth daily.   carvedilol 3.125 MG tablet Commonly known as:  COREG take 1/2 tablet by mouth at bedtime   CoQ10 200 MG Caps Take 1 capsule by mouth daily.   EPIPEN 2-PAK 0.3 mg/0.3 mL Soaj injection Generic drug:  EPINEPHrine Inject 1 Dose as directed as needed.   ergocalciferol 50000 units capsule Commonly known as:  VITAMIN D2 Take 50,000 Units by mouth 2 (two) times a week.   finasteride 5 MG tablet Commonly known as:  PROSCAR Take 5 mg by mouth daily.   hydrocortisone 10 MG tablet Commonly known as:  CORTEF Take 15 mg by mouth daily. One and on half tablet daily   levothyroxine 150 MCG tablet Commonly known as:  SYNTHROID, LEVOTHROID Take 150 mcg by mouth daily before breakfast.   MYRBETRIQ 50 MG Tb24 tablet Generic drug:  mirabegron ER Take 50 mg by mouth daily.   nitroGLYCERIN 0.4 MG SL tablet Commonly known as:  NITROSTAT Place 1 tablet (0.4 mg total) under the tongue every 5 (five) minutes as needed for chest pain.   Omega-3 1000 MG Caps Take 1 capsule (1,000 mg total) by mouth 2 (two) times daily.   omeprazole 40 MG capsule Commonly known as:  PRILOSEC Take 40 mg by mouth daily.   Red Yeast Rice Extract 600 MG Caps Take 600 mg by mouth daily.   REPATHA 140 MG/ML Sosy Generic drug:  Evolocumab Inject into the skin every 14 (fourteen) days.   SYSTANE BALANCE OP Apply 2 drops to eye daily as needed (dry eye).   tadalafil 5 MG tablet Commonly  known as:  CIALIS Take 1 tablet (5 mg total) by mouth daily as needed for erectile dysfunction. Do not take nitro within 48 hrs after taking cialis   testosterone cypionate 200 MG/ML injection Commonly known as:  DEPOTESTOSTERONE CYPIONATE Inject into the muscle every 21 ( twenty-one) days. 1.62 % injection   ticagrelor 60 MG Tabs tablet Commonly known as:  BRILINTA Take 1 tablet (60 mg total) by mouth 2 (two) times daily.   VESICARE 10 MG tablet Generic drug:  solifenacin Take 5 mg by mouth daily.      Follow-up Information    PERINI,MARK A, MD In 1 week.   Specialty:  Internal Medicine Why:  Hospital follow up.Left message for office to call patient Contact information: [place] [address] [phone]            Time coordinating discharge: 45 minutes.  Signed:  RAMA,CHRISTINA  Pager [phone] [facility] 12/06/2016, 10:44 AM

## 2016-12-06 NOTE — Progress Notes (Signed)
Pt has orders to be discharged. Discharge instructions given and pt has no additional questions at this time. Medication regimen reviewed and pt educated. Pt verbalized understanding and has no additional questions. Telemetry box removed. IV removed and site in good condition. Pt stable and waiting for transportation.  Urvi Imes RN 

## 2016-12-06 NOTE — Discharge Instructions (Signed)
Cortisol is a hormone that plays an important role in many bodily functions. It helps regulate your blood pressure and maintain a healthy blood sugar (blood glucose) level. It aids digestion by breaking down carbohydrates, fats, and proteins. Cortisol also triggers the fight or flight reaction that your body uses to respond to stressful situations. The adrenal glands produce cortisol in response to stimulation from a hormone made by the pituitary gland (adrenocorticotropic hormone or ACTH). Some medicines can impact cortisol levels. Problems with your pituitary gland or adrenal glands can also change the cortisol level in your body. These include:  Hypopituitarism.  Acute adrenal crisis.  A tumor of the adrenal gland.  Your cortisol needs go up when you have an acute illness, and during these times, you must take "stress doses" of your usual replacement dose, which is typically 3 times the usual dose for 3 days.  Common symptoms of low cortisol include:  Severe fatigue.  Muscle weakness.  Loss of appetite.  Weight loss.  Drops in blood pressure. Other symptoms include:  Nausea or vomiting.  Diarrhea.  Dizziness or fainting.  Irritability  Depression.  Salt cravings.  Low blood sugar (hypoglycemia).   If you have these symptoms, it is important to call your PCP immediately.

## 2016-12-12 DIAGNOSIS — N179 Acute kidney failure, unspecified: Secondary | ICD-10-CM | POA: Diagnosis not present

## 2016-12-12 DIAGNOSIS — R8299 Other abnormal findings in urine: Secondary | ICD-10-CM | POA: Diagnosis not present

## 2016-12-12 DIAGNOSIS — E871 Hypo-osmolality and hyponatremia: Secondary | ICD-10-CM | POA: Diagnosis not present

## 2016-12-12 DIAGNOSIS — R358 Other polyuria: Secondary | ICD-10-CM | POA: Diagnosis not present

## 2016-12-12 DIAGNOSIS — E298 Other testicular dysfunction: Secondary | ICD-10-CM | POA: Diagnosis not present

## 2016-12-13 ENCOUNTER — Ambulatory Visit: Payer: Medicare Other | Attending: Internal Medicine | Admitting: Physical Therapy

## 2016-12-13 DIAGNOSIS — R262 Difficulty in walking, not elsewhere classified: Secondary | ICD-10-CM | POA: Diagnosis not present

## 2016-12-13 DIAGNOSIS — M6281 Muscle weakness (generalized): Secondary | ICD-10-CM | POA: Insufficient documentation

## 2016-12-14 DIAGNOSIS — E038 Other specified hypothyroidism: Secondary | ICD-10-CM | POA: Diagnosis not present

## 2016-12-17 ENCOUNTER — Encounter: Payer: Self-pay | Admitting: Physical Therapy

## 2016-12-17 DIAGNOSIS — J3089 Other allergic rhinitis: Secondary | ICD-10-CM | POA: Diagnosis not present

## 2016-12-17 DIAGNOSIS — J301 Allergic rhinitis due to pollen: Secondary | ICD-10-CM | POA: Diagnosis not present

## 2016-12-17 DIAGNOSIS — J3081 Allergic rhinitis due to animal (cat) (dog) hair and dander: Secondary | ICD-10-CM | POA: Diagnosis not present

## 2016-12-17 NOTE — Therapy (Signed)
[facility] [address], [place], [place], [address] Phone: [phone]   Fax:  [phone]  Physical Therapy Evaluation  Patient Details  Name: [name], [name] MRN: [id number] Date of Birth: 03/02/1934 Referring Provider: Venetia Maxon Rama, MD  Encounter Date: 12/13/2016      PT End of Session - 12/17/16 0902    Visit Number 1   Number of Visits 10   Date for PT Re-Evaluation 02/07/17   Authorization Type gcodes needed; KX at visit 15   PT Start Time 1450   PT Stop Time 1531   PT Time Calculation (min) 41 min   Activity Tolerance Patient tolerated treatment well   Behavior During Therapy [facility] for tasks assessed/performed;Agitated      Past Medical History:  Diagnosis Date  . Atrial premature beats   . BPH (benign prostatic hyperplasia)   . BPH (benign prostatic hypertrophy)   . Cataract, right eye   . Chronic low back pain   . Colon polyp   . Coronary artery disease   . DDD (degenerative disc disease), lumbar   . Dermatitis 11/16  . Hyperlipidemia   . Insomnia   . Kidney cysts   . Memory loss 2015  . Non-ischemic cardiomyopathy ([place])   . NSTEMI (non-ST elevated myocardial infarction) ([place]) 08/25/2014  . Osteoarthritis of right knee   . Pituitary tumor 2011   macroadenoma w/ VF compromise-then gamma knife a few years leater for some recurrence  . Status post insertion of drug-eluting stent into left anterior descending (LAD) artery 08/28/15   + nuc study.   . Urinary retention   . Vitamin D deficiency 01/2008    Past Surgical History:  Procedure Laterality Date  . ARTHROSCOPIC SURGERY    . BACK SURGERY    . CARDIAC CATHETERIZATION  07/27/2015   Dr Aundra Dubin; oLAD 95%, mLAD 50%, CFX stent OK, OM1 60%, OM2 99%, pPLOM 50%, RCA irregular, AM2 70%, EF 55-60%, no regional wall motion abnormalities.     Marland Kitchen CARDIAC CATHETERIZATION N/A 07/28/2015   Procedure: Left Heart Cath and Coronary Angiography;   Surgeon: [name], MD;  Location: [place] [facility];  Service: Cardiovascular;  Laterality: N/A;  . CARDIAC CATHETERIZATION N/A 07/28/2015   Procedure: Coronary Stent Intervention;  Surgeon: Troy Sine, MD;  Location: [place] [facility];  Service: Cardiovascular;  Laterality: N/A;  . CATARACT EXTRACTION    . CORONARY ANGIOPLASTY WITH STENT PLACEMENT  07/27/2015   XIENCE ALPINE RX 3.0X33 DES to the LAD, 95%>>0  . HAMMER  BUNION TOE SURGERY    . HAND SURGERY Left   . LEFT HEART CATHETERIZATION WITH CORONARY ANGIOGRAM N/A 08/25/2014   Procedure: LEFT HEART CATHETERIZATION WITH CORONARY ANGIOGRAM;  Surgeon: Burnell Blanks, MD;  Location: [facility] CATH LAB;  Service: Cardiovascular;  Laterality: N/A;  . PITUITARY SURGERY     12/01/09 Dr. Wilburn Cornelia and [name].    There were no vitals filed for this visit.           [facility] PT Assessment - 12/17/16 0001      Assessment   Medical Diagnosis M54.40 bilateral low back pain with sciatica   Prior Therapy no     Precautions   Precautions None     Restrictions   Weight Bearing Restrictions No     Home Environment   Living Environment Private residence   Living Arrangements Spouse/significant other     Prior Function   Level of Independence  Independent     Cognition   Overall Cognitive Status Within Functional Limits for tasks assessed     Observation/Other Assessments   Observations pt is very aggitated and abrasive   Focus on Therapeutic Outcomes (FOTO)  18% limited  goal 10%     Posture/Postural Control   Posture/Postural Control Postural limitations   Postural Limitations Rounded Shoulders;Forward head;Flexed trunk     Strength   Overall Strength Comments LE grossly 4/5 bilateral hip; bilateral hip extension, abduction 4/5; 5/5 knee and ankle bilateral     Ambulation/Gait   Gait Pattern Trunk flexed;Wide base of support     Standardized Balance Assessment   Five times sit to stand comments  19 sec      Berg Balance Test   Sit to Stand Able to stand without using hands and stabilize independently   Standing Unsupported Able to stand safely 2 minutes   Sitting with Back Unsupported but Feet Supported on Floor or Stool Able to sit safely and securely 2 minutes   Stand to Sit Sits safely with minimal use of hands   Transfers Able to transfer safely, minor use of hands   Standing Unsupported with Eyes Closed Able to stand 10 seconds safely   Standing Ubsupported with Feet Together Able to place feet together independently and stand 1 minute safely   From Standing, Reach Forward with Outstretched Arm Can reach forward >12 cm safely (5")   From Standing Position, Pick up Object from Floor Able to pick up shoe safely and easily   From Standing Position, Turn to Look Behind Over each Shoulder Looks behind from both sides and weight shifts well   Turn 360 Degrees Able to turn 360 degrees safely one side only in 4 seconds or less   Standing Unsupported, Alternately Place Feet on Step/Stool Able to stand independently and safely and complete 8 steps in 20 seconds   Standing Unsupported, One Foot in Front Able to take small step independently and hold 30 seconds   Standing on One Leg Tries to lift leg/unable to hold 3 seconds but remains standing independently   Total Score 49                             PT Short Term Goals - 12/17/16 1427      PT SHORT TERM GOAL #1   Title independent with initial HEP   Time 4   Period Weeks   Status New     PT SHORT TERM GOAL #2   Title five times sit to stand < or = to 15 sec   Time 4   Period Weeks   Status New           PT Long Term Goals - 12/17/16 1428      PT LONG TERM GOAL #1   Title FOTO < or = to 10% limited   Time 8   Period Weeks   Status New     PT LONG TERM GOAL #2   Title increased hip strength to 5/5 MMT for improved gait and stability   Time 8   Period Weeks   Status New     PT LONG TERM GOAL #3   Title  independent with advanced HEP   Time 8   Period Weeks   Status New     PT LONG TERM GOAL #4   Title Berg assessment improved to > or equal to 52/56 for  reduced risk of falls   Time 8   Period Weeks   Status New               Plan - Jan 14, 2017 1408    Clinical Impression Statement Patient is low complexity due to condition currently improving.  Patient presents to clinic with weakness of bilateral hips 4-/5. Pt demonstrates poor posture with rounded shoulders flexed trunk, forward head.  His gait is unsteady with wide base of support and flexed trunk.  Patient is very agitated at being at therapy and feels like everyone is overreacting.  It is unclear of whether this is his baseline or if there has been a change in personality since recent hosptitalizaiton.  Pt wife reports that he is having difficulty getting up from the floor if he gets down there, but patient denies this.  Berg assessment places patient at fall risk with 49/56.  Five times sit to stand 19 seconds. Pt needs skilled PT to address these impairments and reduce risk of falls   Rehab Potential Good   Clinical Impairments Affecting Rehab Potential pituitary gland deficits   PT Frequency 2x / week   PT Duration 8 weeks   PT Treatment/Interventions ADLs/Self Care Home Management;Cryotherapy;Electrical Stimulation;Moist Heat;Patient/family education;Taping;Manual techniques;Therapeutic activities;Therapeutic exercise;Balance training;Neuromuscular re-education;Gait training;Stair training   PT Next Visit Plan balance, posture, overall strengthening and stretching program   Recommended Other Services none   Consulted and Agree with Plan of Care Patient;Family member/caregiver  patient and his wife in disagreement about coming to PT but pt reluctantly agreed to try   Family Member Consulted wife      Patient will benefit from skilled therapeutic intervention in order to improve the following deficits and impairments:  Abnormal  gait, Decreased balance, Decreased endurance, Decreased strength, Difficulty walking  Visit Diagnosis: Difficulty in walking, not elsewhere classified  Muscle weakness (generalized)      G-Codes - 01-14-2017 1407    Functional Assessment Tool Used (Outpatient Only) FOTO and clinical reasoning   Functional Limitation Mobility: Walking and moving around   Mobility: Walking and Moving Around Current Status [phone]) At least 20 percent but less than 40 percent impaired, limited or restricted   Mobility: Walking and Moving Around Goal Status [phone]) At least 1 percent but less than 20 percent impaired, limited or restricted       Problem List Patient Active Problem List   Diagnosis Date Noted  . Urinary retention due to benign prostatic hyperplasia 12/05/2016  . Overactive bladder 12/05/2016  . Acute adrenal insufficiency ([place]) 12/03/2016  . AKI (acute kidney injury) ([place])   . Right bundle branch block   . Orthostatic hypotension   . Coronary artery disease involving native coronary artery of native heart without angina pectoris   . History of ST elevation myocardial infarction (STEMI)   . H/O: pituitary tumor   . Chronic bilateral low back pain without sciatica   . Hyponatremia   . Leukocytosis   . Falls 12/02/2016  . Diarrhea 12/02/2016  . Syncope 12/02/2016  . Nasal fracture 12/02/2016  . Abnormal stress test 07/29/2015  . Angina effort ([place])   . Abnormal nuclear stress test   . NSTEMI (non-ST elevated myocardial infarction) ([place]) 08/25/2014  . Basal cell carcinoma 07/15/2014  . Open wound of nose with complication 24/40/1027  . Disease of upper respiratory system 07/15/2014  . Chronic infection of sinus 07/15/2014  . Dyssomnia 07/15/2014  . Dermatologic disease 04/05/2014  . CA of skin 04/05/2014  . Pituitary macroadenoma  with extrasellar extension ([place]) 01/13/2014  . 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 Benign neoplasm of pituitary  gland ([place]) 01/13/2014  . Mild cognitive disorder 01/13/2014  . PAC (premature atrial contraction) 12/10/2013  . APC (atrial premature contractions) 12/10/2013  . HYPERCHOLESTEROLEMIA  IIA 02/10/2009  . CAD, NATIVE VESSEL 02/10/2009  . Cardiomyopathy ([place]) 02/10/2009  . BRADYCARDIA 02/10/2009  . Cardiac conduction disorder 02/10/2009  . CAD in native artery 02/10/2009    Zannie Cove, PT 12/17/2016, 2:33 PM  [facility] [address] [place], [place], [address] Phone: [phone]   Fax:  [phone]  Name: CHORD TAKAHASHI, [name] MRN: [id number] Date of Birth: 1933/12/18

## 2016-12-19 ENCOUNTER — Encounter: Payer: Self-pay | Admitting: Physical Therapy

## 2016-12-19 ENCOUNTER — Ambulatory Visit: Payer: Medicare Other | Admitting: Physical Therapy

## 2016-12-19 DIAGNOSIS — M6281 Muscle weakness (generalized): Secondary | ICD-10-CM | POA: Diagnosis not present

## 2016-12-19 DIAGNOSIS — N3281 Overactive bladder: Secondary | ICD-10-CM | POA: Diagnosis not present

## 2016-12-19 DIAGNOSIS — R262 Difficulty in walking, not elsewhere classified: Secondary | ICD-10-CM

## 2016-12-19 DIAGNOSIS — N390 Urinary tract infection, site not specified: Secondary | ICD-10-CM | POA: Diagnosis not present

## 2016-12-19 DIAGNOSIS — I951 Orthostatic hypotension: Secondary | ICD-10-CM | POA: Diagnosis not present

## 2016-12-19 DIAGNOSIS — Z6830 Body mass index (BMI) 30.0-30.9, adult: Secondary | ICD-10-CM | POA: Diagnosis not present

## 2016-12-19 DIAGNOSIS — E2740 Unspecified adrenocortical insufficiency: Secondary | ICD-10-CM | POA: Diagnosis not present

## 2016-12-19 NOTE — Therapy (Signed)
[facility] [address] [place], [place], [address] Phone: [phone]   Fax:  [phone]  Physical Therapy Treatment  Patient Details  Name: [name], [name] MRN: [id number] Date of Birth: [birth year] Referring Provider: Venetia Maxon Rama, MD  Encounter Date: 12/19/2016      PT End of Session - 12/19/16 1056    Visit Number 2   Number of Visits 10   Date for PT Re-Evaluation 02/07/17   Authorization Type gcodes needed; KX at visit 15   PT Start Time 1015   PT Stop Time 1056   PT Time Calculation (min) 41 min   Activity Tolerance Patient tolerated treatment well   Behavior During Therapy [facility] for tasks assessed/performed;Agitated      Past Medical History:  Diagnosis Date  . Atrial premature beats   . BPH (benign prostatic hyperplasia)   . BPH (benign prostatic hypertrophy)   . Cataract, right eye   . Chronic low back pain   . Colon polyp   . Coronary artery disease   . DDD (degenerative disc disease), lumbar   . Dermatitis 11/16  . Hyperlipidemia   . Insomnia   . Kidney cysts   . Memory loss 2015  . Non-ischemic cardiomyopathy ([place])   . NSTEMI (non-ST elevated myocardial infarction) ([place]) 08/25/2014  . Osteoarthritis of right knee   . Pituitary tumor 2011   macroadenoma w/ VF compromise-then gamma knife a few years leater for some recurrence  . Status post insertion of drug-eluting stent into left anterior descending (LAD) artery 08/28/15   + nuc study.   . Urinary retention   . Vitamin D deficiency 01/2008    Past Surgical History:  Procedure Laterality Date  . ARTHROSCOPIC SURGERY    . BACK SURGERY    . CARDIAC CATHETERIZATION  07/27/2015   Dr Aundra Dubin; oLAD 95%, mLAD 50%, CFX stent OK, OM1 60%, OM2 99%, pPLOM 50%, RCA irregular, AM2 70%, EF 55-60%, no regional wall motion abnormalities.     Marland Kitchen CARDIAC CATHETERIZATION N/A 07/28/2015   Procedure: Left Heart Cath and Coronary Angiography;   Surgeon: [name], MD;  Location: [place] [facility];  Service: Cardiovascular;  Laterality: N/A;  . CARDIAC CATHETERIZATION N/A 07/28/2015   Procedure: Coronary Stent Intervention;  Surgeon: Troy Sine, MD;  Location: [place] [facility];  Service: Cardiovascular;  Laterality: N/A;  . CATARACT EXTRACTION    . CORONARY ANGIOPLASTY WITH STENT PLACEMENT  07/27/2015   XIENCE ALPINE RX 3.0X33 DES to the LAD, 95%>>0  . HAMMER  BUNION TOE SURGERY    . HAND SURGERY Left   . LEFT HEART CATHETERIZATION WITH CORONARY ANGIOGRAM N/A 08/25/2014   Procedure: LEFT HEART CATHETERIZATION WITH CORONARY ANGIOGRAM;  Surgeon: Burnell Blanks, MD;  Location: [facility]ATH LAB;  Service: Cardiovascular;  Laterality: N/A;  . PITUITARY SURGERY     12/01/09 Dr. Wilburn Cornelia and [name].    There were no vitals filed for this visit.      Subjective Assessment - 12/19/16 1018    Subjective Pt reports no issues since last visit   Pertinent History Pt prefers to be called "Dr. Belenda Cruise"   Currently in Pain? No/denies                         [facility] Adult PT Treatment/Exercise - 12/19/16 0001      High Level Balance   High Level Balance Comments tapping up to 6''  step with min A, static standing on foam x 3 minutes min A.  rocker board with UE support x 2 minutes     Knee/Hip Exercises: Aerobic   Nustep 6 minutes level 4     Knee/Hip Exercises: Standing   Knee Flexion Strengthening;Both;2 sets;10 reps   Hip Flexion Stengthening;Both;2 sets;10 reps  2#   Hip Abduction Stengthening;Both;2 sets;10 reps  2#   Hip Extension Stengthening;Both;2 sets;10 reps  2#   Walking with Sports Cord 35# bkwds x 10, sideways x 5     Knee/Hip Exercises: Seated   Long Arc Quad Strengthening;Both;1 set;10 reps  5 sec hold, 2#   Sit to [employer] --  10x without UE                  PT Short Term Goals - 12/19/16 1059      PT SHORT TERM GOAL #1   Title independent with initial HEP   Status  On-going     PT SHORT TERM GOAL #2   Title five times sit to stand < or = to 15 sec   Status On-going           PT Long Term Goals - 12/19/16 1059      PT LONG TERM GOAL #1   Title FOTO < or = to 10% limited   Status On-going     PT LONG TERM GOAL #2   Title increased hip strength to 5/5 MMT for improved gait and stability   Status On-going     PT LONG TERM GOAL #3   Title independent with advanced HEP   Status On-going     PT LONG TERM GOAL #4   Title Berg assessment improved to > or equal to 52/56 for reduced risk of falls   Status On-going               Plan - 12/19/16 1059    Rehab Potential Good   Clinical Impairments Affecting Rehab Potential pituitary gland deficits   PT Frequency 2x / week   PT Duration 8 weeks   PT Treatment/Interventions ADLs/Self Care Home Management;Cryotherapy;Electrical Stimulation;Moist Heat;Patient/family education;Taping;Manual techniques;Therapeutic activities;Therapeutic exercise;Balance training;Neuromuscular re-education;Gait training;Stair training   PT Next Visit Plan continue balance and strengthening   Consulted and Agree with Plan of Care Patient      Patient will benefit from skilled therapeutic intervention in order to improve the following deficits and impairments:  Abnormal gait, Decreased balance, Decreased endurance, Decreased strength, Difficulty walking  Visit Diagnosis: Difficulty in walking, not elsewhere classified  Muscle weakness (generalized)     Problem List Patient Active Problem List   Diagnosis Date Noted  . Urinary retention due to benign prostatic hyperplasia 12/05/2016  . Overactive bladder 12/05/2016  . Acute adrenal insufficiency ([place]) 12/03/2016  . AKI (acute kidney injury) ([place])   . Right bundle branch block   . Orthostatic hypotension   . Coronary artery disease involving native coronary artery of native heart without angina pectoris   . History of ST elevation myocardial  infarction (STEMI)   . H/O: pituitary tumor   . Chronic bilateral low back pain without sciatica   . Hyponatremia   . Leukocytosis   . Falls 12/02/2016  . Diarrhea 12/02/2016  . Syncope 12/02/2016  . Nasal fracture 12/02/2016  . Abnormal stress test 07/29/2015  . Angina effort ([place])   . Abnormal nuclear stress test   . NSTEMI (non-ST elevated myocardial infarction) ([place]) 08/25/2014  . Basal cell carcinoma 07/15/2014  .  Open wound of nose with complication 18/40/3754  . Disease of upper respiratory system 07/15/2014  . Chronic infection of sinus 07/15/2014  . Dyssomnia 07/15/2014  . Dermatologic disease 04/05/2014  . CA of skin 04/05/2014  . Pituitary macroadenoma with extrasellar extension ([place]) 01/13/2014  . 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 Benign neoplasm of pituitary gland ([place]) 01/13/2014  . Mild cognitive disorder 01/13/2014  . PAC (premature atrial contraction) 12/10/2013  . APC (atrial premature contractions) 12/10/2013  . HYPERCHOLESTEROLEMIA  IIA 02/10/2009  . CAD, NATIVE VESSEL 02/10/2009  . Cardiomyopathy ([place]) 02/10/2009  . BRADYCARDIA 02/10/2009  . Cardiac conduction disorder 02/10/2009  . CAD in native artery 02/10/2009    Isabelle Course, PT, DPT 12/19/2016, 11:00 AM  [facility] [address], [place], [place], [address] Phone: [phone]   Fax:  [phone]  Name: [name], [name] MRN: [id number] Date of Birth: [birth year]

## 2016-12-26 ENCOUNTER — Ambulatory Visit: Payer: Medicare Other | Admitting: Physical Therapy

## 2016-12-26 DIAGNOSIS — M6281 Muscle weakness (generalized): Secondary | ICD-10-CM

## 2016-12-26 DIAGNOSIS — R262 Difficulty in walking, not elsewhere classified: Secondary | ICD-10-CM | POA: Diagnosis not present

## 2016-12-26 NOTE — Therapy (Signed)
[facility] [address], [place], [place], [address] Phone: [phone]   Fax:  [phone]  Physical Therapy Treatment  Patient Details  Name: [name], [name] MRN: [id number] Date of Birth: [birth year] Referring Provider: Venetia Maxon Rama, MD  Encounter Date: 12/26/2016      PT End of Session - 12/26/16 1011    Visit Number 3   Number of Visits 10   Date for PT Re-Evaluation 02/07/17   Authorization Type gcodes needed; KX at visit 15   PT Start Time 1008   PT Stop Time 1052   PT Time Calculation (min) 44 min   Activity Tolerance Patient tolerated treatment well   Behavior During Therapy [facility] for tasks assessed/performed;Agitated      Past Medical History:  Diagnosis Date  . Atrial premature beats   . BPH (benign prostatic hyperplasia)   . BPH (benign prostatic hypertrophy)   . Cataract, right eye   . Chronic low back pain   . Colon polyp   . Coronary artery disease   . DDD (degenerative disc disease), lumbar   . Dermatitis 11/16  . Hyperlipidemia   . Insomnia   . Kidney cysts   . Memory loss 2015  . Non-ischemic cardiomyopathy ([place])   . NSTEMI (non-ST elevated myocardial infarction) ([place]) 08/25/2014  . Osteoarthritis of right knee   . Pituitary tumor 2011   macroadenoma w/ VF compromise-then gamma knife a few years leater for some recurrence  . Status post insertion of drug-eluting stent into left anterior descending (LAD) artery 08/28/15   + nuc study.   . Urinary retention   . Vitamin D deficiency 01/2008    Past Surgical History:  Procedure Laterality Date  . ARTHROSCOPIC SURGERY    . BACK SURGERY    . CARDIAC CATHETERIZATION  07/27/2015   Dr Aundra Dubin; oLAD 95%, mLAD 50%, CFX stent OK, OM1 60%, OM2 99%, pPLOM 50%, RCA irregular, AM2 70%, EF 55-60%, no regional wall motion abnormalities.     Marland Kitchen CARDIAC CATHETERIZATION N/A 07/28/2015   Procedure: Left Heart Cath and Coronary Angiography;   Surgeon: [name], MD;  Location: [place] CV [facility];  Service: Cardiovascular;  Laterality: N/A;  . CARDIAC CATHETERIZATION N/A 07/28/2015   Procedure: Coronary Stent Intervention;  Surgeon: Troy Sine, MD;  Location: [place] CV [facility];  Service: Cardiovascular;  Laterality: N/A;  . CATARACT EXTRACTION    . CORONARY ANGIOPLASTY WITH STENT PLACEMENT  07/27/2015   XIENCE ALPINE RX 3.0X33 DES to the LAD, 95%>>0  . HAMMER  BUNION TOE SURGERY    . HAND SURGERY Left   . LEFT HEART CATHETERIZATION WITH CORONARY ANGIOGRAM N/A 08/25/2014   Procedure: LEFT HEART CATHETERIZATION WITH CORONARY ANGIOGRAM;  Surgeon: Burnell Blanks, MD;  Location: [facility] CATH LAB;  Service: Cardiovascular;  Laterality: N/A;  . PITUITARY SURGERY     12/01/09 Dr. Wilburn Cornelia and [name].    There were no vitals filed for this visit.                       [place] Adult PT Treatment/Exercise - 12/26/16 0001      High Level Balance   High Level Balance Comments tapping up to 6'' step with min A, static standing on foam x 3 minutes min A.  rocker board with UE support x 2 minutes     Therapeutic Activites    Therapeutic Activities ADL's   ADL's walking, pulling, reaching  Neuro Re-ed    Neuro Re-ed Details  Balance and posture training with activities     Knee/Hip Exercises: Aerobic   Nustep 6 minutes level 4     Knee/Hip Exercises: Standing   Hip Flexion Stengthening;Both;2 sets;10 reps  2#   Hip Abduction Stengthening;Both;2 sets;10 reps  2#   Hip Extension Stengthening;Both;2 sets;10 reps  2#   Walking with Sports Cord 35# bkwds x 10, sideways x 5     Knee/Hip Exercises: Seated   Long Arc Quad Strengthening;Both;1 set;10 reps  5 sec hold, 2#   Abduction/Adduction  Strengthening;Both;20 reps;1 set  Clams red band   Sit to [employer] --  10x without UE     Shoulder Exercises: Standing   Extension Strengthening;Both;20 reps  #25   Row Strengthening;Both;20 reps  #25                   PT Short Term Goals - 12/26/16 1012      PT SHORT TERM GOAL #1   Title independent with initial HEP   Time 4   Period Weeks   Status On-going     PT SHORT TERM GOAL #2   Title five times sit to stand < or = to 15 sec   Time 4   Period Weeks   Status On-going           PT Long Term Goals - 12/26/16 1012      PT LONG TERM GOAL #1   Title FOTO < or = to 10% limited   Time 8   Period Weeks   Status On-going     PT LONG TERM GOAL #2   Title increased hip strength to 5/5 MMT for improved gait and stability   Time 8   Period Weeks   Status On-going     PT LONG TERM GOAL #3   Title independent with advanced HEP   Time 8   Period Weeks   Status On-going     PT LONG TERM GOAL #4   Title Berg assessment improved to > or equal to 52/56 for reduced risk of falls   Time 8   Period Weeks   Status On-going               Plan - 12/26/16 1055    Clinical Impression Statement Pt did well with all exercises needing verbal cues to slow down and control movements with good posture. Pt is CGA for balance today. Pt will continue to benefit from skilled therapy for strength and stability to improve balance and proprioception.    Rehab Potential Good   Clinical Impairments Affecting Rehab Potential pituitary gland deficits   PT Frequency 2x / week   PT Duration 8 weeks   PT Treatment/Interventions ADLs/Self Care Home Management;Cryotherapy;Electrical Stimulation;Moist Heat;Patient/family education;Taping;Manual techniques;Therapeutic activities;Therapeutic exercise;Balance training;Neuromuscular re-education;Gait training;Stair training   PT Next Visit Plan continue balance and strengthening   Consulted and Agree with Plan of Care Patient   Family Member Consulted wife      Patient will benefit from skilled therapeutic intervention in order to improve the following deficits and impairments:  Abnormal gait, Decreased balance, Decreased endurance,  Decreased strength, Difficulty walking  Visit Diagnosis: Difficulty in walking, not elsewhere classified  Muscle weakness (generalized)     Problem List Patient Active Problem List   Diagnosis Date Noted  . Urinary retention due to benign prostatic hyperplasia 12/05/2016  . Overactive bladder 12/05/2016  . Acute adrenal insufficiency ([place]) 12/03/2016  .  AKI (acute kidney injury) ([place])   . Right bundle branch block   . Orthostatic hypotension   . Coronary artery disease involving native coronary artery of native heart without angina pectoris   . History of ST elevation myocardial infarction (STEMI)   . H/O: pituitary tumor   . Chronic bilateral low back pain without sciatica   . Hyponatremia   . Leukocytosis   . Falls 12/02/2016  . Diarrhea 12/02/2016  . Syncope 12/02/2016  . Nasal fracture 12/02/2016  . Abnormal stress test 07/29/2015  . Angina effort ([place])   . Abnormal nuclear stress test   . NSTEMI (non-ST elevated myocardial infarction) ([place]) 08/25/2014  . Basal cell carcinoma 07/15/2014  . Open wound of nose with complication 81/07/3158  . Disease of upper respiratory system 07/15/2014  . Chronic infection of sinus 07/15/2014  . Dyssomnia 07/15/2014  . Dermatologic disease 04/05/2014  . CA of skin 04/05/2014  . Pituitary macroadenoma with extrasellar extension ([place]) 01/13/2014  . 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 Benign neoplasm of pituitary gland ([place]) 01/13/2014  . Mild cognitive disorder 01/13/2014  . PAC (premature atrial contraction) 12/10/2013  . APC (atrial premature contractions) 12/10/2013  . HYPERCHOLESTEROLEMIA  IIA 02/10/2009  . CAD, NATIVE VESSEL 02/10/2009  . Cardiomyopathy ([place]) 02/10/2009  . BRADYCARDIA 02/10/2009  . Cardiac conduction disorder 02/10/2009  . CAD in native artery 02/10/2009    Mikle Bosworth PTA 12/26/2016, 11:22 AM  [facility] [address], [place], [place], [address] Phone: [phone]   Fax:  [phone]  Name: ASHFORD CLOUSE, [name] MRN: [id number] Date of Birth: [birth year]

## 2017-01-02 ENCOUNTER — Encounter: Payer: Self-pay | Admitting: Physical Therapy

## 2017-01-02 ENCOUNTER — Ambulatory Visit: Payer: Medicare Other | Attending: Internal Medicine | Admitting: Physical Therapy

## 2017-01-02 DIAGNOSIS — M6281 Muscle weakness (generalized): Secondary | ICD-10-CM | POA: Insufficient documentation

## 2017-01-02 DIAGNOSIS — R262 Difficulty in walking, not elsewhere classified: Secondary | ICD-10-CM | POA: Insufficient documentation

## 2017-01-02 NOTE — Therapy (Signed)
[facility] [address], [place], [place], [address] Phone: [phone]   Fax:  [phone]  Physical Therapy Treatment  Patient Details  Name: [name], [name] MRN: [id number] Date of Birth: 25-Aug-1934 Referring Provider: Venetia Maxon Rama, MD  Encounter Date: 01/02/2017      PT End of Session - 01/02/17 1044    Visit Number 4   Number of Visits 10   Date for PT Re-Evaluation 02/07/17   Authorization Type gcodes needed; KX at visit 15   PT Start Time 1017   PT Stop Time 1058   PT Time Calculation (min) 41 min   Activity Tolerance Patient tolerated treatment well   Behavior During Therapy [facility] for tasks assessed/performed;Agitated      Past Medical History:  Diagnosis Date  . Atrial premature beats   . BPH (benign prostatic hyperplasia)   . BPH (benign prostatic hypertrophy)   . Cataract, right eye   . Chronic low back pain   . Colon polyp   . Coronary artery disease   . DDD (degenerative disc disease), lumbar   . Dermatitis 11/16  . Hyperlipidemia   . Insomnia   . Kidney cysts   . Memory loss 2015  . Non-ischemic cardiomyopathy ([place])   . NSTEMI (non-ST elevated myocardial infarction) ([place]) 08/25/2014  . Osteoarthritis of right knee   . Pituitary tumor 2011   macroadenoma w/ VF compromise-then gamma knife a few years leater for some recurrence  . Status post insertion of drug-eluting stent into left anterior descending (LAD) artery 08/28/15   + nuc study.   . Urinary retention   . Vitamin D deficiency 01/2008    Past Surgical History:  Procedure Laterality Date  . ARTHROSCOPIC SURGERY    . BACK SURGERY    . CARDIAC CATHETERIZATION  07/27/2015   Dr Aundra Dubin; oLAD 95%, mLAD 50%, CFX stent OK, OM1 60%, OM2 99%, pPLOM 50%, RCA irregular, AM2 70%, EF 55-60%, no regional wall motion abnormalities.     Marland Kitchen CARDIAC CATHETERIZATION N/A 07/28/2015   Procedure: Left Heart Cath and Coronary Angiography;  Surgeon:  [name], MD;  Location: [place] CV LAB;  Service: Cardiovascular;  Laterality: N/A;  . CARDIAC CATHETERIZATION N/A 07/28/2015   Procedure: Coronary Stent Intervention;  Surgeon: Troy Sine, MD;  Location: [place] CV LAB;  Service: Cardiovascular;  Laterality: N/A;  . CATARACT EXTRACTION    . CORONARY ANGIOPLASTY WITH STENT PLACEMENT  07/27/2015   XIENCE ALPINE RX 3.0X33 DES to the LAD, 95%>>0  . HAMMER  BUNION TOE SURGERY    . HAND SURGERY Left   . LEFT HEART CATHETERIZATION WITH CORONARY ANGIOGRAM N/A 08/25/2014   Procedure: LEFT HEART CATHETERIZATION WITH CORONARY ANGIOGRAM;  Surgeon: Burnell Blanks, MD;  Location: [facility] CATH LAB;  Service: Cardiovascular;  Laterality: N/A;  . PITUITARY SURGERY     12/01/09 Dr. Wilburn Cornelia and [name].    There were no vitals filed for this visit.      Subjective Assessment - 01/02/17 1043    Subjective Pt feeling good   Currently in Pain? No/denies                         [facility] Adult PT Treatment/Exercise - 01/02/17 0001      Therapeutic Activites    ADL's walking, pulling, reaching     Neuro Re-ed    Neuro Re-ed Details  Balance and posture training on foam  mat with weight shifting     Knee/Hip Exercises: Aerobic   Nustep 8 minutes level 4     Knee/Hip Exercises: Machines for Strengthening   Total Gym Leg Press bilateral 105# 2x10; single leg 65# 10x each  left     Knee/Hip Exercises: Standing   Hip Flexion Stengthening;Both;2 sets;10 reps  2#   Hip Abduction Stengthening;Both;2 sets;10 reps  2#   Hip Extension Stengthening;Both;2 sets;10 reps  2#   Forward Step Up Both;15 reps;Hand Hold: 2;Step Height: 6"  2.5# ankle weight   Walking with Sports Cord 35# bkwds and forwrds x 5, sideways x 3 in each direction     Shoulder Exercises: Standing   Extension Strengthening;Both;20 reps  #25   Row Strengthening;Both;20 reps  #25                  PT Short Term Goals - 01/02/17 1100       PT SHORT TERM GOAL #1   Title independent with initial HEP   Time 4   Period Weeks   Status On-going     PT SHORT TERM GOAL #2   Title five times sit to stand < or = to 15 sec   Time 4   Period Weeks   Status On-going           PT Long Term Goals - 01/02/17 1100      PT LONG TERM GOAL #1   Title FOTO < or = to 10% limited   Time 8   Period Weeks   Status On-going     PT LONG TERM GOAL #2   Title increased hip strength to 5/5 MMT for improved gait and stability   Time 8   Period Weeks   Status On-going     PT LONG TERM GOAL #3   Title independent with advanced HEP   Time 8   Period Weeks   Status On-going     PT LONG TERM GOAL #4   Title Berg assessment improved to > or equal to 52/56 for reduced risk of falls   Time 8   Period Weeks   Status On-going               Plan - 01/02/17 1045    Clinical Impression Statement Pt did well with exercises had more difficulty with Left LE and exercises that challenged posterior muscle groups.  Pt continues to need cues for controlled and slow movements.  Will benefit from skilled PT for posture and balance   Rehab Potential Good   Clinical Impairments Affecting Rehab Potential pituitary gland deficits   PT Treatment/Interventions ADLs/Self Care Home Management;Cryotherapy;Electrical Stimulation;Moist Heat;Patient/family education;Taping;Manual techniques;Therapeutic activities;Therapeutic exercise;Balance training;Neuromuscular re-education;Gait training;Stair training   PT Next Visit Plan continue balance and strengthening   Consulted and Agree with Plan of Care Patient      Patient will benefit from skilled therapeutic intervention in order to improve the following deficits and impairments:  Abnormal gait, Decreased balance, Decreased endurance, Decreased strength, Difficulty walking  Visit Diagnosis: Difficulty in walking, not elsewhere classified  Muscle weakness (generalized)     Problem  List Patient Active Problem List   Diagnosis Date Noted  . Urinary retention due to benign prostatic hyperplasia 12/05/2016  . Overactive bladder 12/05/2016  . Acute adrenal insufficiency ([place]) 12/03/2016  . AKI (acute kidney injury) ([place])   . Right bundle branch block   . Orthostatic hypotension   . Coronary artery disease involving native coronary artery of native  heart without angina pectoris   . History of ST elevation myocardial infarction (STEMI)   . H/O: pituitary tumor   . Chronic bilateral low back pain without sciatica   . Hyponatremia   . Leukocytosis   . Falls 12/02/2016  . Diarrhea 12/02/2016  . Syncope 12/02/2016  . Nasal fracture 12/02/2016  . Abnormal stress test 07/29/2015  . Angina effort ([place])   . Abnormal nuclear stress test   . NSTEMI (non-ST elevated myocardial infarction) ([place]) 08/25/2014  . Basal cell carcinoma 07/15/2014  . Open wound of nose with complication 94/47/3958  . Disease of upper respiratory system 07/15/2014  . Chronic infection of sinus 07/15/2014  . Dyssomnia 07/15/2014  . Dermatologic disease 04/05/2014  . CA of skin 04/05/2014  . Pituitary macroadenoma with extrasellar extension ([place]) 01/13/2014  . 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 Benign neoplasm of pituitary gland ([place]) 01/13/2014  . Mild cognitive disorder 01/13/2014  . PAC (premature atrial contraction) 12/10/2013  . APC (atrial premature contractions) 12/10/2013  . HYPERCHOLESTEROLEMIA  IIA 02/10/2009  . CAD, NATIVE VESSEL 02/10/2009  . Cardiomyopathy ([place]) 02/10/2009  . BRADYCARDIA 02/10/2009  . Cardiac conduction disorder 02/10/2009  . CAD in native artery 02/10/2009    Zannie Cove, PT 01/02/2017, 1:19 PM  [facility] [address], [place], [place], [address] Phone: [phone]   Fax:  [phone]  Name: GASPAR FOWLE, [name] MRN: [id number] Date of Birth: [birth year]

## 2017-01-03 DIAGNOSIS — R7301 Impaired fasting glucose: Secondary | ICD-10-CM | POA: Diagnosis not present

## 2017-01-03 DIAGNOSIS — I779 Disorder of arteries and arterioles, unspecified: Secondary | ICD-10-CM | POA: Diagnosis not present

## 2017-01-03 DIAGNOSIS — N401 Enlarged prostate with lower urinary tract symptoms: Secondary | ICD-10-CM | POA: Diagnosis not present

## 2017-01-03 DIAGNOSIS — E291 Testicular hypofunction: Secondary | ICD-10-CM | POA: Diagnosis not present

## 2017-01-03 DIAGNOSIS — E784 Other hyperlipidemia: Secondary | ICD-10-CM | POA: Diagnosis not present

## 2017-01-03 DIAGNOSIS — D352 Benign neoplasm of pituitary gland: Secondary | ICD-10-CM | POA: Diagnosis not present

## 2017-01-03 DIAGNOSIS — E230 Hypopituitarism: Secondary | ICD-10-CM | POA: Diagnosis not present

## 2017-01-03 DIAGNOSIS — R269 Unspecified abnormalities of gait and mobility: Secondary | ICD-10-CM | POA: Diagnosis not present

## 2017-01-03 DIAGNOSIS — D751 Secondary polycythemia: Secondary | ICD-10-CM | POA: Diagnosis not present

## 2017-01-03 DIAGNOSIS — E2740 Unspecified adrenocortical insufficiency: Secondary | ICD-10-CM | POA: Diagnosis not present

## 2017-01-03 DIAGNOSIS — R972 Elevated prostate specific antigen [PSA]: Secondary | ICD-10-CM | POA: Diagnosis not present

## 2017-01-03 DIAGNOSIS — I2510 Atherosclerotic heart disease of native coronary artery without angina pectoris: Secondary | ICD-10-CM | POA: Diagnosis not present

## 2017-01-03 DIAGNOSIS — E871 Hypo-osmolality and hyponatremia: Secondary | ICD-10-CM | POA: Diagnosis not present

## 2017-01-03 DIAGNOSIS — E038 Other specified hypothyroidism: Secondary | ICD-10-CM | POA: Diagnosis not present

## 2017-01-03 DIAGNOSIS — E559 Vitamin D deficiency, unspecified: Secondary | ICD-10-CM | POA: Diagnosis not present

## 2017-01-09 ENCOUNTER — Ambulatory Visit: Payer: Medicare Other | Admitting: Physical Therapy

## 2017-01-09 ENCOUNTER — Encounter: Payer: Self-pay | Admitting: Physical Therapy

## 2017-01-09 DIAGNOSIS — R262 Difficulty in walking, not elsewhere classified: Secondary | ICD-10-CM | POA: Diagnosis not present

## 2017-01-09 DIAGNOSIS — M6281 Muscle weakness (generalized): Secondary | ICD-10-CM

## 2017-01-09 NOTE — Therapy (Signed)
[facility] [address] [place], [place], [address] Phone: [phone]   Fax:  [phone]  Physical Therapy Treatment  Patient Details  Name: [name], [name] MRN: [id number] Date of Birth: 1934-03-27 Referring Provider: Venetia Maxon Rama, MD  Encounter Date: 01/09/2017      PT End of Session - 01/09/17 1020    Visit Number 5   Number of Visits 10   Date for PT Re-Evaluation 02/07/17   Authorization Type gcodes needed; KX at visit 15   PT Start Time 1018   PT Stop Time 1101   PT Time Calculation (min) 43 min   Activity Tolerance Patient tolerated treatment well   Behavior During Therapy [facility] for tasks assessed/performed;Agitated      Past Medical History:  Diagnosis Date  . Atrial premature beats   . BPH (benign prostatic hyperplasia)   . BPH (benign prostatic hypertrophy)   . Cataract, right eye   . Chronic low back pain   . Colon polyp   . Coronary artery disease   . DDD (degenerative disc disease), lumbar   . Dermatitis 11/16  . Hyperlipidemia   . Insomnia   . Kidney cysts   . Memory loss 2015  . Non-ischemic cardiomyopathy ([place])   . NSTEMI (non-ST elevated myocardial infarction) ([place]) 08/25/2014  . Osteoarthritis of right knee   . Pituitary tumor 2011   macroadenoma w/ VF compromise-then gamma knife a few years leater for some recurrence  . Status post insertion of drug-eluting stent into left anterior descending (LAD) artery 08/28/15   + nuc study.   . Urinary retention   . Vitamin D deficiency 01/2008    Past Surgical History:  Procedure Laterality Date  . ARTHROSCOPIC SURGERY    . BACK SURGERY    . CARDIAC CATHETERIZATION  07/27/2015   Dr Aundra Dubin; oLAD 95%, mLAD 50%, CFX stent OK, OM1 60%, OM2 99%, pPLOM 50%, RCA irregular, AM2 70%, EF 55-60%, no regional wall motion abnormalities.     Marland Kitchen CARDIAC CATHETERIZATION N/A 07/28/2015   Procedure: Left Heart Cath and Coronary Angiography;   Surgeon: [name], MD;  Location: [place] CV LAB;  Service: Cardiovascular;  Laterality: N/A;  . CARDIAC CATHETERIZATION N/A 07/28/2015   Procedure: Coronary Stent Intervention;  Surgeon: Troy Sine, MD;  Location: [place] [facility] LAB;  Service: Cardiovascular;  Laterality: N/A;  . CATARACT EXTRACTION    . CORONARY ANGIOPLASTY WITH STENT PLACEMENT  07/27/2015   XIENCE ALPINE RX 3.0X33 DES to the LAD, 95%>>0  . HAMMER  BUNION TOE SURGERY    . HAND SURGERY Left   . LEFT HEART CATHETERIZATION WITH CORONARY ANGIOGRAM N/A 08/25/2014   Procedure: LEFT HEART CATHETERIZATION WITH CORONARY ANGIOGRAM;  Surgeon: Burnell Blanks, MD;  Location: [facility] CATH LAB;  Service: Cardiovascular;  Laterality: N/A;  . PITUITARY SURGERY     12/01/09 Dr. Wilburn Cornelia and [name].    There were no vitals filed for this visit.      Subjective Assessment - 01/09/17 1020    Subjective Pt doing well today. Hit some golf balls over the weekend.    Patient is accompained by: Family member   Pertinent History Pt prefers to be called "Dr. Belenda Cruise"   How long can you walk comfortably? walking aobut 2 minutes at a time   Patient Stated Goals wants go up and down stairs easier   Currently in Pain? No/denies   Multiple Pain Sites No  [place] Adult PT Treatment/Exercise - 01/09/17 0001      Therapeutic Activites    Therapeutic Activities ADL's   ADL's walking, pulling, reaching     Neuro Re-ed    Neuro Re-ed Details  Balance and posture training on foam mat with weight shifting     Knee/Hip Exercises: Aerobic   Nustep 8 minutes level 4  Therapist present to discuss treatment     Knee/Hip Exercises: Machines for Strengthening   Total Gym Leg Press bilateral 105# 2x10; single leg 65# 10x each  Seat 8     Knee/Hip Exercises: Standing   Hip Flexion Stengthening;Both;2 sets;10 reps  2#   Hip Abduction Stengthening;Both;2 sets;10 reps  2#   Hip Extension  Stengthening;Both;2 sets;10 reps  2#   Forward Step Up Both;15 reps;Hand Hold: 2;Step Height: 6"  2.5# ankle weight   Walking with Sports Cord 35# bkwds and forwrds x 5, sideways x 3 in each direction     Shoulder Exercises: Standing   Extension Strengthening;Both;20 reps  #25   Row Strengthening;Both;20 reps  #25                  PT Short Term Goals - 01/09/17 1021      PT SHORT TERM GOAL #1   Title independent with initial HEP   Time 4   Period Weeks   Status Achieved     PT SHORT TERM GOAL #2   Title five times sit to stand < or = to 15 sec   Baseline 17 seconds (done after exercises, pt fatigued)   Time 4   Period Weeks   Status On-going           PT Long Term Goals - 01/09/17 1052      PT LONG TERM GOAL #1   Title FOTO < or = to 10% limited   Time 8   Period Weeks   Status On-going     PT LONG TERM GOAL #3   Title independent with advanced HEP   Time 8   Period Weeks   Status On-going               Plan - 01/09/17 1051    Clinical Impression Statement Pt able to tolerate all exercises well with good balance. Needing verbal cues for good posture and control of movements. Pt performed 5 sit to stands in 17 seconds, but test was performed at end of session and patient was fatigued. Pt was able to do 5 more sit to stands in 10 seconds but did not stand all the way up at top.    Rehab Potential Good   Clinical Impairments Affecting Rehab Potential pituitary gland deficits   PT Frequency 2x / week   PT Duration 8 weeks   PT Treatment/Interventions ADLs/Self Care Home Management;Cryotherapy;Electrical Stimulation;Moist Heat;Patient/family education;Taping;Manual techniques;Therapeutic activities;Therapeutic exercise;Balance training;Neuromuscular re-education;Gait training;Stair training   PT Next Visit Plan continue balance and strengthening   Consulted and Agree with Plan of Care Patient   Family Member Consulted wife      Patient will  benefit from skilled therapeutic intervention in order to improve the following deficits and impairments:  Abnormal gait, Decreased balance, Decreased endurance, Decreased strength, Difficulty walking  Visit Diagnosis: Difficulty in walking, not elsewhere classified  Muscle weakness (generalized)     Problem List Patient Active Problem List   Diagnosis Date Noted  . Urinary retention due to benign prostatic hyperplasia 12/05/2016  . Overactive bladder 12/05/2016  . Acute adrenal  insufficiency ([place]) 12/03/2016  . AKI (acute kidney injury) ([place])   . Right bundle branch block   . Orthostatic hypotension   . Coronary artery disease involving native coronary artery of native heart without angina pectoris   . History of ST elevation myocardial infarction (STEMI)   . H/O: pituitary tumor   . Chronic bilateral low back pain without sciatica   . Hyponatremia   . Leukocytosis   . Falls 12/02/2016  . Diarrhea 12/02/2016  . Syncope 12/02/2016  . Nasal fracture 12/02/2016  . Abnormal stress test 07/29/2015  . Angina effort ([place])   . Abnormal nuclear stress test   . NSTEMI (non-ST elevated myocardial infarction) ([place]) 08/25/2014  . Basal cell carcinoma 07/15/2014  . Open wound of nose with complication 42/59/5638  . Disease of upper respiratory system 07/15/2014  . Chronic infection of sinus 07/15/2014  . Dyssomnia 07/15/2014  . Dermatologic disease 04/05/2014  . CA of skin 04/05/2014  . Pituitary macroadenoma with extrasellar extension ([place]) 01/13/2014  . 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 Benign neoplasm of pituitary gland ([place]) 01/13/2014  . Mild cognitive disorder 01/13/2014  . PAC (premature atrial contraction) 12/10/2013  . APC (atrial premature contractions) 12/10/2013  . HYPERCHOLESTEROLEMIA  IIA 02/10/2009  . CAD, NATIVE VESSEL 02/10/2009  . Cardiomyopathy ([place]) 02/10/2009  . BRADYCARDIA 02/10/2009  . Cardiac conduction disorder  02/10/2009  . CAD in native artery 02/10/2009    Mikle Bosworth PTA 01/09/2017, 11:02 AM  [facility] [address], [place], [place], [address] Phone: [phone]   Fax:  [phone]  Name: [name], [name] MRN: [id number] Date of Birth: [birth year]

## 2017-01-11 DIAGNOSIS — J3081 Allergic rhinitis due to animal (cat) (dog) hair and dander: Secondary | ICD-10-CM | POA: Diagnosis not present

## 2017-01-11 DIAGNOSIS — J301 Allergic rhinitis due to pollen: Secondary | ICD-10-CM | POA: Diagnosis not present

## 2017-01-11 DIAGNOSIS — J3089 Other allergic rhinitis: Secondary | ICD-10-CM | POA: Diagnosis not present

## 2017-01-16 ENCOUNTER — Ambulatory Visit: Payer: Medicare Other | Admitting: Physical Therapy

## 2017-01-16 DIAGNOSIS — R262 Difficulty in walking, not elsewhere classified: Secondary | ICD-10-CM

## 2017-01-16 DIAGNOSIS — M6281 Muscle weakness (generalized): Secondary | ICD-10-CM

## 2017-01-16 DIAGNOSIS — E298 Other testicular dysfunction: Secondary | ICD-10-CM | POA: Diagnosis not present

## 2017-01-16 NOTE — Therapy (Signed)
[facility] [address] [place], [place], [address] Phone: [phone]   Fax:  [phone]  Physical Therapy Treatment  Patient Details  Name: [name], [name] MRN: [id number] Date of Birth: 1934/07/16 Referring Provider: Venetia Maxon Rama, MD  Encounter Date: 01/16/2017      PT End of Session - 01/16/17 1058    Visit Number 6   Number of Visits 10   Date for PT Re-Evaluation 02/07/17   Authorization Type gcodes needed; KX at visit 15   PT Start Time 1018   PT Stop Time 1058   PT Time Calculation (min) 40 min   Activity Tolerance Patient tolerated treatment well   Behavior During Therapy [facility] for tasks assessed/performed;Agitated      Past Medical History:  Diagnosis Date  . Atrial premature beats   . BPH (benign prostatic hyperplasia)   . BPH (benign prostatic hypertrophy)   . Cataract, right eye   . Chronic low back pain   . Colon polyp   . Coronary artery disease   . DDD (degenerative disc disease), lumbar   . Dermatitis 11/16  . Hyperlipidemia   . Insomnia   . Kidney cysts   . Memory loss 2015  . Non-ischemic cardiomyopathy ([place])   . NSTEMI (non-ST elevated myocardial infarction) ([place]) 08/25/2014  . Osteoarthritis of right knee   . Pituitary tumor 2011   macroadenoma w/ VF compromise-then gamma knife a few years leater for some recurrence  . Status post insertion of drug-eluting stent into left anterior descending (LAD) artery 08/28/15   + nuc study.   . Urinary retention   . Vitamin D deficiency 01/2008    Past Surgical History:  Procedure Laterality Date  . ARTHROSCOPIC SURGERY    . BACK SURGERY    . CARDIAC CATHETERIZATION  07/27/2015   Dr Aundra Dubin; oLAD 95%, mLAD 50%, CFX stent OK, OM1 60%, OM2 99%, pPLOM 50%, RCA irregular, AM2 70%, EF 55-60%, no regional wall motion abnormalities.     Marland Kitchen CARDIAC CATHETERIZATION N/A 07/28/2015   Procedure: Left Heart Cath and Coronary Angiography;   Surgeon: [name], MD;  Location: [place] [facility];  Service: Cardiovascular;  Laterality: N/A;  . CARDIAC CATHETERIZATION N/A 07/28/2015   Procedure: Coronary Stent Intervention;  Surgeon: Troy Sine, MD;  Location: [place] [facility];  Service: Cardiovascular;  Laterality: N/A;  . CATARACT EXTRACTION    . CORONARY ANGIOPLASTY WITH STENT PLACEMENT  07/27/2015   XIENCE ALPINE RX 3.0X33 DES to the LAD, 95%>>0  . HAMMER  BUNION TOE SURGERY    . HAND SURGERY Left   . LEFT HEART CATHETERIZATION WITH CORONARY ANGIOGRAM N/A 08/25/2014   Procedure: LEFT HEART CATHETERIZATION WITH CORONARY ANGIOGRAM;  Surgeon: Burnell Blanks, MD;  [facility];  Service: Cardiovascular;  Laterality: N/A;  . PITUITARY SURGERY     12/01/09 Dr. Wilburn Cornelia and [name].    There were no vitals filed for this visit.      Subjective Assessment - 01/16/17 1110    Subjective Pt doing well.  Painted the floor of his garage and is feeling it in his knees especially the right knee.  Pt states he does not have any difficulty doing things he needs to around the house.   Pertinent History Pt prefers to be called "Dr. Belenda Cruise"   How long can you walk comfortably? walking aobut 2 minutes at a time   Patient Stated Goals wants go up and  down stairs easier   Currently in Pain? No/denies                         [facility] Adult PT Treatment/Exercise - 01/16/17 0001      Therapeutic Activites    Therapeutic Activities ADL's   ADL's walking, pulling, reaching     Neuro Re-ed    Neuro Re-ed Details  Balance and posture training on foam mat with standing modified tandem     Knee/Hip Exercises: Aerobic   Nustep 8 minutes level 4  Therapist present to discuss treatment     Knee/Hip Exercises: Standing   Heel Raises 2 sets;15 reps  no UE 3# ankle weight   Hip Flexion Stengthening;Both;2 sets;10 reps  3#   Hip Abduction Stengthening;Both;2 sets;10 reps  3#   Hip Extension  Stengthening;Both;2 sets;10 reps  3#   Forward Step Up Both;Hand Hold: 2;Step Height: 6";15 reps;2 sets  3# ankle weight   Walking with Sports Cord 35# bkwds and forwrds x 5, sideways x 5in each direction     Knee/Hip Exercises: Seated   Sit to Sand --  10x without UE     Shoulder Exercises: Standing   Extension Strengthening;Both;20 reps  45#   Row Strengthening;Both;20 reps  35# 30x, 45# 20x                  PT Short Term Goals - 01/16/17 1050      PT SHORT TERM GOAL #2   Title five times sit to stand < or = to 15 sec   Baseline 10 sec   Time 4   Period Weeks   Status On-going           PT Long Term Goals - 01/16/17 1051      PT LONG TERM GOAL #2   Title increased hip strength to 5/5 MMT for improved gait and stability   Time 8   Period Weeks   Status On-going     PT LONG TERM GOAL #4   Title Berg assessment improved to > or equal to 52/56 for reduced risk of falls   Time 8   Period Weeks   Status On-going               Plan - 01/16/17 1059    Clinical Impression Statement Patient able to perform exercises with good balance and increased difficulty.  He reports no difficulty with any functional activities at home and has not had LOB.  He continues to ambulate with turnk lean to the left during gait.  Patient will benefit from skilled PT for education on continued exercises with HEP, increased balance and strength for posture and improved gait.   Rehab Potential Good   Clinical Impairments Affecting Rehab Potential pituitary gland deficits   PT Treatment/Interventions ADLs/Self Care Home Management;Cryotherapy;Electrical Stimulation;Moist Heat;Patient/family education;Taping;Manual techniques;Therapeutic activities;Therapeutic exercise;Balance training;Neuromuscular re-education;Gait training;Stair training   PT Next Visit Plan Berg, continue balance and strengthening, discuss discharge at next visit if met goals   Consulted and Agree with Plan  of Care Patient      Patient will benefit from skilled therapeutic intervention in order to improve the following deficits and impairments:  Abnormal gait, Decreased balance, Decreased endurance, Decreased strength, Difficulty walking  Visit Diagnosis: Difficulty in walking, not elsewhere classified  Muscle weakness (generalized)     Problem List Patient Active Problem List   Diagnosis Date Noted  . Urinary retention due to benign prostatic hyperplasia  12/05/2016  . Overactive bladder 12/05/2016  . Acute adrenal insufficiency ([place]) 12/03/2016  . AKI (acute kidney injury) ([place])   . Right bundle branch block   . Orthostatic hypotension   . Coronary artery disease involving native coronary artery of native heart without angina pectoris   . History of ST elevation myocardial infarction (STEMI)   . H/O: pituitary tumor   . Chronic bilateral low back pain without sciatica   . Hyponatremia   . Leukocytosis   . Falls 12/02/2016  . Diarrhea 12/02/2016  . Syncope 12/02/2016  . Nasal fracture 12/02/2016  . Abnormal stress test 07/29/2015  . Angina effort ([place])   . Abnormal nuclear stress test   . NSTEMI (non-ST elevated myocardial infarction) ([place]) 08/25/2014  . Basal cell carcinoma 07/15/2014  . Open wound of nose with complication 09/82/8675  . Disease of upper respiratory system 07/15/2014  . Chronic infection of sinus 07/15/2014  . Dyssomnia 07/15/2014  . Dermatologic disease 04/05/2014  . CA of skin 04/05/2014  . Pituitary macroadenoma with extrasellar extension ([place]) 01/13/2014  . 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 Benign neoplasm of pituitary gland ([place]) 01/13/2014  . Mild cognitive disorder 01/13/2014  . PAC (premature atrial contraction) 12/10/2013  . APC (atrial premature contractions) 12/10/2013  . HYPERCHOLESTEROLEMIA  IIA 02/10/2009  . CAD, NATIVE VESSEL 02/10/2009  . Cardiomyopathy ([place]) 02/10/2009  . BRADYCARDIA 02/10/2009   . Cardiac conduction disorder 02/10/2009  . CAD in native artery 02/10/2009    Zannie Cove, PT 01/16/2017, 11:14 AM  [facility] [address] [place], [place], [address] Phone: [phone]   Fax:  [phone]  Name: [name], [name] MRN: [id number] Date of Birth: [birth year]

## 2017-01-23 ENCOUNTER — Ambulatory Visit: Payer: Medicare Other | Admitting: Neurology

## 2017-01-23 ENCOUNTER — Ambulatory Visit: Payer: Medicare Other | Admitting: Physical Therapy

## 2017-01-23 DIAGNOSIS — M6281 Muscle weakness (generalized): Secondary | ICD-10-CM

## 2017-01-23 DIAGNOSIS — R262 Difficulty in walking, not elsewhere classified: Secondary | ICD-10-CM

## 2017-01-23 DIAGNOSIS — J3089 Other allergic rhinitis: Secondary | ICD-10-CM | POA: Diagnosis not present

## 2017-01-23 DIAGNOSIS — J301 Allergic rhinitis due to pollen: Secondary | ICD-10-CM | POA: Diagnosis not present

## 2017-01-23 DIAGNOSIS — J3081 Allergic rhinitis due to animal (cat) (dog) hair and dander: Secondary | ICD-10-CM | POA: Diagnosis not present

## 2017-01-23 NOTE — Therapy (Signed)
[facility] [address], [place], [place], [address] Phone: [phone]   Fax:  [phone]  Physical Therapy Treatment  Patient Details  Name: BRENNDEN MASTEN, [name] MRN: [id number] Date of Birth: Jul 04, 1934 Referring Provider: Venetia Maxon Rama, MD  Encounter Date: 01/23/2017      PT End of Session - 01/23/17 1023    Visit Number 7   Number of Visits 10   Date for PT Re-Evaluation 02/07/17   Authorization Type gcodes needed; KX at visit 15   PT Start Time 1015   PT Stop Time 1100   PT Time Calculation (min) 45 min   Activity Tolerance Patient tolerated treatment well   Behavior During Therapy [facility] for tasks assessed/performed;Agitated      Past Medical History:  Diagnosis Date  . Atrial premature beats   . BPH (benign prostatic hyperplasia)   . BPH (benign prostatic hypertrophy)   . Cataract, right eye   . Chronic low back pain   . Colon polyp   . Coronary artery disease   . DDD (degenerative disc disease), lumbar   . Dermatitis 11/16  . Hyperlipidemia   . Insomnia   . Kidney cysts   . Memory loss 2015  . Non-ischemic cardiomyopathy ([place])   . NSTEMI (non-ST elevated myocardial infarction) ([place]) 08/25/2014  . Osteoarthritis of right knee   . Pituitary tumor 2011   macroadenoma w/ VF compromise-then gamma knife a few years leater for some recurrence  . Status post insertion of drug-eluting stent into left anterior descending (LAD) artery 08/28/15   + nuc study.   . Urinary retention   . Vitamin D deficiency 01/2008    Past Surgical History:  Procedure Laterality Date  . ARTHROSCOPIC SURGERY    . BACK SURGERY    . CARDIAC CATHETERIZATION  07/27/2015   Dr Aundra Dubin; oLAD 95%, mLAD 50%, CFX stent OK, OM1 60%, OM2 99%, pPLOM 50%, RCA irregular, AM2 70%, EF 55-60%, no regional wall motion abnormalities.     Marland Kitchen CARDIAC CATHETERIZATION N/A 07/28/2015   Procedure: Left Heart Cath and Coronary Angiography;   Surgeon: [name], MD;  Location: [place] [facility];  Service: Cardiovascular;  Laterality: N/A;  . CARDIAC CATHETERIZATION N/A 07/28/2015   Procedure: Coronary Stent Intervention;  Surgeon: Troy Sine, MD;  Location: [place] [facility];  Service: Cardiovascular;  Laterality: N/A;  . CATARACT EXTRACTION    . CORONARY ANGIOPLASTY WITH STENT PLACEMENT  07/27/2015   XIENCE ALPINE RX 3.0X33 DES to the LAD, 95%>>0  . HAMMER  BUNION TOE SURGERY    . HAND SURGERY Left   . LEFT HEART CATHETERIZATION WITH CORONARY ANGIOGRAM N/A 08/25/2014   Procedure: LEFT HEART CATHETERIZATION WITH CORONARY ANGIOGRAM;  Surgeon: Burnell Blanks, MD;  Location: [facility] CATH LAB;  Service: Cardiovascular;  Laterality: N/A;  . PITUITARY SURGERY     12/01/09 Dr. Wilburn Cornelia and [name].    There were no vitals filed for this visit.      Subjective Assessment - 01/23/17 1102    Subjective I am doing everything I need to do and don't need any help.  Pt feels ready to be discharged.   Currently in Pain? No/denies                         [facility] Adult PT Treatment/Exercise - 01/23/17 0001      Standardized Balance Assessment   Standardized Balance Assessment Berg Balance Test  Berg Balance Test   Sit to Stand Able to stand without using hands and stabilize independently   Standing Unsupported Able to stand safely 2 minutes   Sitting with Back Unsupported but Feet Supported on Floor or Stool Able to sit safely and securely 2 minutes   Stand to Sit Sits safely with minimal use of hands   Transfers Able to transfer safely, minor use of hands   Standing Unsupported with Eyes Closed Able to stand 10 seconds safely   Standing Ubsupported with Feet Together Able to place feet together independently and stand 1 minute safely   From Standing, Reach Forward with Outstretched Arm Can reach forward >12 cm safely (5")   From Standing Position, Pick up Object from Floor Able to pick up shoe  safely and easily   From Standing Position, Turn to Look Behind Over each Shoulder Looks behind from both sides and weight shifts well   Turn 360 Degrees Able to turn 360 degrees safely in 4 seconds or less   Standing Unsupported, Alternately Place Feet on Step/Stool Able to stand independently and safely and complete 8 steps in 20 seconds   Standing Unsupported, One Foot in Front Able to plae foot ahead of the other independently and hold 30 seconds   Standing on One Leg Unable to try or needs assist to prevent fall   Total Score 50     Knee/Hip Exercises: Aerobic   Nustep 8 minutes level 4  Therapist present to discuss treatment     Knee/Hip Exercises: Standing   Knee Flexion Strengthening;Both;15 reps  3#    Hip Abduction Stengthening;Both;2 sets;10 reps  3#   Hip Extension Stengthening;Both;2 sets;10 reps  3#   Forward Step Up Both;Hand Hold: 2;Step Height: 6";15 reps;2 sets  3# ankle weight                  PT Short Term Goals - 01/23/17 1101      PT SHORT TERM GOAL #1   Title independent with initial HEP   Time 4   Period Weeks   Status Achieved     PT SHORT TERM GOAL #2   Title five times sit to stand < or = to 15 sec   Baseline 10 sec   Time 4   Period Weeks   Status Achieved           PT Long Term Goals - 01/23/17 1101      PT LONG TERM GOAL #1   Title FOTO < or = to 10% limited   Time 8   Period Weeks   Status Not Met     PT LONG TERM GOAL #2   Title increased hip strength to 5/5 MMT for improved gait and stability   Baseline 4+/5   Time 8   Period Weeks   Status Not Met     PT LONG TERM GOAL #3   Title independent with advanced HEP   Time 8   Period Weeks   Status Achieved     PT LONG TERM GOAL #4   Title Berg assessment improved to > or equal to 52/56 for reduced risk of falls   Baseline 50/56 from 49/56   Period Weeks   Status Not Met               Plan - 01/23/17 1106    Clinical Impression Statement Patient has  improved balance and report he is feeling stronger and able to do activities that  he wants to participate in.  Pt is safe to discharge with current activities and HEP.   Rehab Potential Good   Clinical Impairments Affecting Rehab Potential pituitary gland deficits   PT Treatment/Interventions ADLs/Self Care Home Management;Cryotherapy;Electrical Stimulation;Moist Heat;Patient/family education;Taping;Manual techniques;Therapeutic activities;Therapeutic exercise;Balance training;Neuromuscular re-education;Gait training;Stair training   Consulted and Agree with Plan of Care Patient      Patient will benefit from skilled therapeutic intervention in order to improve the following deficits and impairments:  Abnormal gait, Decreased balance, Decreased endurance, Decreased strength, Difficulty walking  Visit Diagnosis: Difficulty in walking, not elsewhere classified  Muscle weakness (generalized)       G-Codes - 01-27-17 1105    Functional Assessment Tool Used (Outpatient Only) FOTO, BERG, and clinical reasoning   Functional Limitation Mobility: Walking and moving around   Mobility: Walking and Moving Around Current Status [phone]) At least 20 percent but less than 40 percent impaired, limited or restricted   Mobility: Walking and Moving Around Goal Status [phone]) At least 1 percent but less than 20 percent impaired, limited or restricted   Mobility: Walking and Moving Around Discharge Status [phone]) At least 20 percent but less than 40 percent impaired, limited or restricted      Problem List Patient Active Problem List   Diagnosis Date Noted  . Urinary retention due to benign prostatic hyperplasia 12/05/2016  . Overactive bladder 12/05/2016  . Acute adrenal insufficiency ([place]) 12/03/2016  . AKI (acute kidney injury) ([place])   . Right bundle branch block   . Orthostatic hypotension   . Coronary artery disease involving native coronary artery of native heart without angina pectoris   .  History of ST elevation myocardial infarction (STEMI)   . H/O: pituitary tumor   . Chronic bilateral low back pain without sciatica   . Hyponatremia   . Leukocytosis   . Falls 12/02/2016  . Diarrhea 12/02/2016  . Syncope 12/02/2016  . Nasal fracture 12/02/2016  . Abnormal stress test 07/29/2015  . Angina effort ([place])   . Abnormal nuclear stress test   . NSTEMI (non-ST elevated myocardial infarction) ([place]) 08/25/2014  . Basal cell carcinoma 07/15/2014  . Open wound of nose with complication 01/60/1093  . Disease of upper respiratory system 07/15/2014  . Chronic infection of sinus 07/15/2014  . Dyssomnia 07/15/2014  . Dermatologic disease 04/05/2014  . CA of skin 04/05/2014  . Pituitary macroadenoma with extrasellar extension ([place]) 01/13/2014  . 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 Benign neoplasm of pituitary gland ([place]) 01/13/2014  . Mild cognitive disorder 01/13/2014  . PAC (premature atrial contraction) 12/10/2013  . APC (atrial premature contractions) 12/10/2013  . HYPERCHOLESTEROLEMIA  IIA 02/10/2009  . CAD, NATIVE VESSEL 02/10/2009  . Cardiomyopathy ([place]) 02/10/2009  . BRADYCARDIA 02/10/2009  . Cardiac conduction disorder 02/10/2009  . CAD in native artery 02/10/2009    Zannie Cove, PT 01/27/2017, 11:07 AM  [facility] [address] [place], [place], [address] Phone: [phone]   Fax:  [phone]  Name: RASHAAN WYLES, [name] MRN: [id number] Date of Birth: 09-08-1934 PHYSICAL THERAPY DISCHARGE SUMMARY  Visits from Start of Care: 7  Current functional level related to goals / functional outcomes: See above goals   Remaining deficits: See above   Education / Equipment: HEP Plan: Patient agrees to discharge.  Patient goals were partially met. Patient is being discharged due to being pleased with the current functional level.  ?????  Zannie Cove,  PT 01/23/17 11:17 AM

## 2017-01-29 DIAGNOSIS — J3089 Other allergic rhinitis: Secondary | ICD-10-CM | POA: Diagnosis not present

## 2017-01-29 DIAGNOSIS — J3081 Allergic rhinitis due to animal (cat) (dog) hair and dander: Secondary | ICD-10-CM | POA: Diagnosis not present

## 2017-01-29 DIAGNOSIS — J301 Allergic rhinitis due to pollen: Secondary | ICD-10-CM | POA: Diagnosis not present

## 2017-01-30 ENCOUNTER — Encounter: Payer: Self-pay | Admitting: Neurology

## 2017-01-30 ENCOUNTER — Ambulatory Visit (INDEPENDENT_AMBULATORY_CARE_PROVIDER_SITE_OTHER): Payer: Medicare Other | Admitting: Neurology

## 2017-01-30 VITALS — BP 133/74 | HR 53 | Ht 75.0 in | Wt 235.0 lb

## 2017-01-30 DIAGNOSIS — I2510 Atherosclerotic heart disease of native coronary artery without angina pectoris: Secondary | ICD-10-CM | POA: Diagnosis not present

## 2017-01-30 DIAGNOSIS — D352 Benign neoplasm of pituitary gland: Secondary | ICD-10-CM | POA: Diagnosis not present

## 2017-01-30 DIAGNOSIS — G3184 Mild cognitive impairment, so stated: Secondary | ICD-10-CM | POA: Diagnosis not present

## 2017-01-30 NOTE — Patient Instructions (Signed)
Cortisol Cortisol is a hormone that plays an important role in many bodily functions. It helps regulate your blood pressure and maintain a healthy blood sugar (blood glucose) level. It aids digestion by breaking down carbohydrates, fats, and proteins. Cortisol also triggers the "fight or flight" reaction that your body uses to respond to stressful situations. The adrenal glands produce cortisol in response to stimulation from a hormone made by the pituitary gland (adrenocorticotropic hormone or ACTH). Some medicines can impact cortisol levels. Problems with your pituitary gland or adrenal glands can also change the cortisol level in your body. These include:  Hypopituitarism.  Acute adrenal crisis.  A tumor of the adrenal gland. A cortisol test measures your body's cortisol level. This can help your health care provider diagnose certain conditions. For example, a high level of cortisol may be a sign of Cushing syndrome, which results from problems with the adrenal gland, or Cushing disease, which results from problems with the pituitary gland. A low cortisol level could signal Addison disease (adrenal insufficiency). Cortisol tests can be done using a sample of blood, urine, or saliva. Cortisol levels vary throughout the day. To get a clear overall picture, many cortisol tests require samples to be taken at different times. It is common to have the blood test at 8 a.m. (when cortisol is highest) or at 4 p.m. (when it is lower). You may even have this test at midnight when cortisol is lowest. Saliva tests are usually done around midnight. Urine samples might include all the urine you produce in a 24-hour period or only a single sample. How do I prepare for this test? If you are having the saliva cortisol test, you may not be able to eat, drink, or use toothpaste for a certain time period before the test. The urine test does not require any special preparation. Your health care provider will give you  specific instructions about timing and how to collect a sample. If you are having the blood cortisol test, you may need to refrain from vigorous exercise and stop taking certain medicines the day before the test. These include:  Antiseizure medicines.  Androgens.  Estrogen.  Glucocorticoids. What do the results mean? It is your responsibility to obtain your test results. Ask the lab or department performing the test when and how you will get your results. Contact your health care provider to discuss any questions you have about your results. Range of Normal Values  Ranges for normal values vary among different labs and hospitals. You should always check with your health care provider after having lab work or other tests done to discuss whether your values are considered within normal limits. This is especially true with your cortisol level because what is considered a normal level will be different throughout the day.  For the morning blood test, a normal cortisol level ranges from 6 to 23 micrograms per deciliter (mcg/dL).  For the evening blood test, a normal cortisol level ranges between 2 to 14 mcg/dL. Meaning of Results Outside Normal Range Values  A cortisol level that is consistently higher than normal can indicate a few possible problems. These include:  Cushing syndrome. This condition results from the pituitary gland making too much ACTH.  Tumors outside the pituitary gland or on the adrenal gland. A cortisol level that is low may be caused by:  Addison disease. In this condition, the adrenal glands do not produce enough cortisol.  Some medicines, such as chemotherapy and glucocorticoid medicines.  Hypopituitarism. In this condition,  the pituitary gland fails to stimulate the adrenal glands to produce enough cortisol. Talk with your health care provider to discuss your results, treatment options, and if necessary, the need for more tests. Talk with your health care provider if  you have any questions about your results. This information is not intended to replace advice given to you by your health care provider. Make sure you discuss any questions you have with your health care provider. Document Released: 10/11/2004 Document Revised: 05/22/2016 Document Reviewed: 12/16/2013 Elsevier Interactive Patient Education  2017 [employer].

## 2017-01-30 NOTE — Progress Notes (Signed)
Provider:  Larey Seat, M D  Referring Provider: [name], MD Primary Care Physician:  Jerlyn Ly, MD  Chief Complaint  Patient presents with  . Follow-up    memory    HPI:  [name], [name] is a 83 y.o. male , seen here as a revisit  from [name]. Joylene Draft for follow up on subjective memory loss/ MCI.  01/13/14 Last visit with [name]. [name]:  [name], a partially retired [name], is presenting here in the presence of his wife.  [name]. [name] wife reported that her husbands personality a has changed, and she feels it is not longer all related to his severe hearing loss. He reports that he has noticed a slowing of his cognitive function over Rather than loss of memory for several years. One of his concerns he is delayed recall of names, which sometimes causes him some embarrassment in social situations. 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  He also states that the use at times compulsively searching for these names and there may come back to his memory with delay.  He also a likes to walk or do some physical activity while thinking about these names.  He had no trouble getting lost while driving and he reports no difficulties with abstraction, attention, or mathematics at basic checkbook balancing etc. In his Montral cognitive assessment test here today he scored 25/30 points. 26/30 is considered the normal range. Based on this test he would be diagnosed as a mild cognitive impairment and not with dementia.  His wife stated he had fallen 4 times and she added some important surgical information, [name] has a history of pituitary Macroadenoma, "the size of a golfball", with previously tunnel vision. [name] Ellene Route treated him.   07-27-15 ,Since last visit, [name] has suffered a heart attack and is now in cardiac rehab, followed by [name]. [name].  feels as if there has been no change in his health or memory.  His wife agrees, but is not personally present. His  2015 MRI follow up study showed multiple foci of T2 hyperintensity are present in the subcortical and deep cerebral white matter, nonspecific but compatible with mild chronic small vessel ischemic disease and mild generalized cerebral atrophy.  Unchanged nodular tissue in the anterior sella and extending into the sphenoid sinus, compatible with residual pituitary adenoma, no change from imaging from [facility]. [name] performed today with [place] in a Mini-Mental Status Examination it revealed 29 out of 30 points and his only missed take was that he couldn't name the exact date which is 07-27-15, This test was followed by a Montral cognitive assessment test and he scored here 25 out of 30 points. The most difficult part of the test are usually clock drawing, the Trail Making Test the serial sevens and the word recall. The patient lost 3 of 5 words and word recall and did repeat one sentence incompletely. This may have been due to his hearing deficits also he is wearing a hearing aid. Visual spatial testing naming and calculation attention and abstraction as well as orientation were fully intact. He generated 79 F- words.   07/25/2016,  CD I had the pleasure of seeing [name] today. A Montral cognitive assessment was performed and he scored 23 out of 30 points. It was date and month but  gave him trouble he had very good visuospatial capacities and the ability to substrate and to spell.He noted delayed recall of names.  I have pleasure of seeing [name] today, 01-30-17, who still practices as a [name] one day a week.  The patient follows [place] for a mild cognitive impairment on a yearly interval. In 2016 he scored on an Mini-Mental exam 29 out of 30 points and on a Montral operative assessment 25 out of 30 points. Since then she has repeatedly performed Montral cognitive assessments and he has been in  the normal range. Today again he scored 26 out of 30 points word fluency was 14, he recalled 5 out of 5 words could name all objects and was able to draw a clock face. He does have good visual spatial understanding, which was certainly necessary for his job. He was evaluated in hospital after a fall- he fell in his house at night face down. EMS was called after he reportedly couldn't feel the legs- couldn't get up. Endocrinologist diagnosed pituitary adrenal failure- treated with hydrocortisone, now on 20 and 10 mg. [name] Forde Dandy will follow.     Montreal Cognitive Assessment  01/30/2017 07/25/2016 07/27/2015 07/28/2014 01/13/2014  Visuospatial/ Executive (0/5) 4 5 5 4 5   Naming (0/3) 3 3 3 3 3   Attention: Read list of digits (0/2) 2 2 2 2 2   Attention: Read list of letters (0/1) 1 1 1 1 1   Attention: Serial 7 subtraction starting at 100 (0/3) 3 2 3 3 3   Language: Repeat phrase (0/2) 1 2 1 2 2   Language : Fluency (0/1) 1 1 1 1 1   Abstraction (0/2) 2 2 2 2 2   Delayed Recall (0/5) 5 1 2 2 2   Orientation (0/6) 4 4 5 6 5   Total 26 23 25 26 26   Adjusted Score (based on education) 26 23 25 26 26      Review of Systems:  Out of a complete 14 system review, the patient complains of only the following symptoms, and all other reviewed systems are negative.  Hoarse voice, intact delayed recall, trouble with trail making. right knee replacement.  TBI in a skiing accident in 2005. Hearing loss, hunting and hobby farming. Nocturia 2-3 times, OCD- compulsions are progressing according to his wife.  He does not want to treat this condition.   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 . Drives. Married with adult children. Non smoker, rare ETOH, some caffeine.     Social History   Social History  . Marital status: Married    Spouse name: [name]  . Number of children: 2  . Years of education: College   Occupational History  . retired Clinical biochemist Retired   Social History Main  Topics  . Smoking status: Never Smoker  . Smokeless tobacco: Never Used  . Alcohol use Yes     Comment: occas.  . Drug use: No  . Sexual activity: Not on file   Other Topics Concern  . Not on file   Social History Narrative   Patient is married [name]).   Patient has two children.   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   Patient is right-handed.   Patient does not drink any caffeine.    Family History  Problem Relation Age of Onset  . Congestive Heart Failure Father 19  . Other Mother 26  . Coronary artery disease Brother   . Other Sister 21  polio  . Heart attack Brother     MI/ASCAD 2006  . COPD Sister 91    Past Medical History:  Diagnosis Date  . Atrial premature beats   . BPH (benign prostatic hyperplasia)   . BPH (benign prostatic hypertrophy)   . Cataract, right eye   . Chronic low back pain   . Colon polyp   . Coronary artery disease   . DDD (degenerative disc disease), lumbar   . Dermatitis 11/16  . Hyperlipidemia   . Insomnia   . Kidney cysts   . Memory loss 2015  . Non-ischemic cardiomyopathy ([place])   . NSTEMI (non-ST elevated myocardial infarction) ([place]) 08/25/2014  . Osteoarthritis of right knee   . Pituitary tumor 2011   macroadenoma w/ VF compromise-then gamma knife a few years leater for some recurrence  . Status post insertion of drug-eluting stent into left anterior descending (LAD) artery 08/28/15   + nuc study.   . Urinary retention   . Vitamin D deficiency 01/2008    Past Surgical History:  Procedure Laterality Date  . ARTHROSCOPIC SURGERY    . BACK SURGERY    . CARDIAC CATHETERIZATION  07/27/2015   [name] Aundra Dubin; oLAD 95%, mLAD 50%, CFX stent OK, OM1 60%, OM2 99%, pPLOM 50%, RCA irregular, AM2 70%, EF 55-60%, no regional wall motion abnormalities.     Marland Kitchen CARDIAC CATHETERIZATION N/A 07/28/2015   Procedure: Left Heart Cath and Coronary Angiography;  Surgeon: [name], MD;  Location: [facility];   Service: Cardiovascular;  Laterality: N/A;  . CARDIAC CATHETERIZATION N/A 07/28/2015   Procedure: Coronary Stent Intervention;  Surgeon: Troy Sine, MD;  Location: [facility];  Service: Cardiovascular;  Laterality: N/A;  . CATARACT EXTRACTION    . CORONARY ANGIOPLASTY WITH STENT PLACEMENT  07/27/2015   XIENCE ALPINE RX 3.0X33 DES to the LAD, 95%>>0  . HAMMER  BUNION TOE SURGERY    . HAND SURGERY Left   . LEFT HEART CATHETERIZATION WITH CORONARY ANGIOGRAM N/A 08/25/2014   Procedure: LEFT HEART CATHETERIZATION WITH CORONARY ANGIOGRAM;  Surgeon: Burnell Blanks, MD;  Location: [facility] CATH LAB;  Service: Cardiovascular;  Laterality: N/A;  . PITUITARY SURGERY     12/01/09 [name]. Wilburn Cornelia and [name]. Ellene Route.    Current Outpatient Prescriptions  Medication Sig Dispense Refill  . alfuzosin (UROXATRAL) 10 MG 24 hr tablet Take 1 tablet (10 mg total) by mouth daily with breakfast. 30 tablet 2  . ALPRAZolam (XANAX) 0.5 MG tablet Take 0.5 mg by mouth as needed for anxiety.     Marland Kitchen aspirin 81 MG tablet Take 81 mg by mouth daily.      . carvedilol (COREG) 3.125 MG tablet take 1/2 tablet by mouth at bedtime 30 tablet 6  . Coenzyme Q10 (COQ10) 200 MG CAPS Take 1 capsule by mouth daily.      Marland Kitchen EPIPEN 2-PAK 0.3 MG/0.3ML SOAJ injection Inject 1 Dose as directed as needed.  0  . ergocalciferol (VITAMIN D2) 50000 UNITS capsule Take 50,000 Units by mouth 2 (two) times a week.     . Evolocumab (REPATHA) 140 MG/ML SOSY Inject into the skin every 14 (fourteen) days.    . finasteride (PROSCAR) 5 MG tablet Take 5 mg by mouth daily.      . hydrocortisone (CORTEF) 10 MG tablet Take 10 mg by mouth 2 (two) times daily. Take 20 mg in the morning and 47m at night,    . levothyroxine (SYNTHROID, LEVOTHROID) 150 MCG tablet  Take 150 mcg by mouth daily before breakfast.     . MYRBETRIQ 50 MG TB24 tablet Take 50 mg by mouth daily.  0  . nitroGLYCERIN (NITROSTAT) 0.4 MG SL tablet Place 1 tablet (0.4 mg total) under the  tongue every 5 (five) minutes as needed for chest pain. 90 tablet 3  . Omega-3 1000 MG CAPS Take 1 capsule (1,000 mg total) by mouth 2 (two) times daily. 60 capsule 11  . omeprazole (PRILOSEC) 40 MG capsule Take 40 mg by mouth daily.  1  . Propylene Glycol (SYSTANE BALANCE OP) Apply 2 drops to eye daily as needed (dry eye).    . Red Yeast Rice Extract 600 MG CAPS Take 600 mg by mouth daily.    . tadalafil (CIALIS) 5 MG tablet Take 1 tablet (5 mg total) by mouth daily as needed for erectile dysfunction. Do not take nitro within 48 hrs after taking cialis 10 tablet 1  . testosterone cypionate (DEPOTESTOTERONE CYPIONATE) 200 MG/ML injection Inject into the muscle every 21 ( twenty-one) days. 1.62 % injection    . ticagrelor (BRILINTA) 60 MG TABS tablet Take 1 tablet (60 mg total) by mouth 2 (two) times daily. 180 tablet 3   No current facility-administered medications for this visit.     Allergies as of 01/30/2017 - Review Complete 01/30/2017  Allergen Reaction Noted  . Sulfamethoxazole Anaphylaxis and Swelling 09/19/2015  . Bee venom Rash 09/19/2015  . Sulfa antibiotics Nausea And Vomiting 07/15/2014  . Sulfonamide derivatives Nausea And Vomiting 02/10/2009  . Tamsulosin  02/10/2009    Vitals: BP 133/74   Pulse (!) 53   Ht 6' 3"  (1.905 m)   Wt 235 lb (106.6 kg)   BMI 29.37 kg/m  Last Weight:  Wt Readings from Last 1 Encounters:  01/30/17 235 lb (106.6 kg)   KNL:ZJQB mass index is 29.37 kg/m.     Last Height:   Ht Readings from Last 1 Encounters:  01/30/17 6' 3"  (1.905 m)    Physical exam:  General: The patient is awake, alert and appears not in acute distress. The patient is well groomed. Head: Normocephalic, atraumatic. Cardiovascular:  Regular rate and rhythm , without  murmurs or carotid bruit, and without distended neck veins. Respiratory: Lungs are clear to auscultation. Skin:  Without evidence of edema, or rash Trunk: The patient's posture is erect, no scoliosis.    Neurologic exam : The patient is awake and alert, oriented to place and time.   Memory subjective  described as intact.  Memory testing revealed  29-30 points on MMSE and 23 out of 30 on MOCA, at best a MCI. Not a dementia.  OCD tendencies confirmed by the patient himself.   MOCA: Montreal Cognitive Assessment  01/30/2017 07/25/2016 07/27/2015 07/28/2014 01/13/2014  Visuospatial/ Executive (0/5) 4 5 5 4 5   Naming (0/3) 3 3 3 3 3   Attention: Read list of digits (0/2) 2 2 2 2 2   Attention: Read list of letters (0/1) 1 1 1 1 1   Attention: Serial 7 subtraction starting at 100 (0/3) 3 2 3 3 3   Language: Repeat phrase (0/2) 1 2 1 2 2   Language : Fluency (0/1) 1 1 1 1 1   Abstraction (0/2) 2 2 2 2 2   Delayed Recall (0/5) 5 1 2 2 2   Orientation (0/6) 4 4 5 6 5   Total 26 23 25 26 26   Adjusted Score (based on education) 26 23 25 26 26    MMSE: MMSE - Mini Mental  State Exam 07/27/2015  Orientation to time 4  Orientation to Place 5  Registration 3  Attention/ Calculation 5  Recall 3  Language- name 2 objects 2  Language- repeat 1  Language- follow 3 step command 3  Language- read & follow direction 1  Write a sentence 1  Copy design 1  Total score 29   Neck 16.5 inches. Mallampati 2.    Attention span & concentration ability appears normal.  Speech is fluent, without dysarthria, but with strong dysphonia or aphasia.  Mood and affect are appropriate.  Cranial nerves: Pupils are equal and briskly reactive to light.  Extraocular movements  in vertical and horizontal planes intact and without nystagmus. Visual fields by finger perimetry are intact. Hearing; hearing aids in place.    Facial sensation intact to fine touch.  Facial motor strength is symmetric and tongue and uvula move midline. Shoulder shrug was symmetrical.   Gait and station: Patient walks without assistive device and is able unassisted to climb up to the exam table. Strength within normal limits.  Stance is stable  and normal based   The patient rises from a seated position without bracing himself. He stands erect. Toe and heel stand were tested . Tandem gait is unfragmented. Turns with 3 Steps. Romberg testing is negative.  Deep tendon reflexes: in the upper and lower extremities are symmetric and intact. Babinski maneuver response is  downgoing.  The patient was advised of the nature of the diagnosed sleep disorder , the treatment options and risks for general a health and wellness arising from not treating the condition.  I spent more than 25 minutes of face to face time with the patient. Greater than 50% of time was spent in counseling and coordination of care. We have discussed the diagnosis and differential and I answered the patient's questions.   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 He walks fast paced every day. He should continue.    Assessment:  Cardiology note- PMH: 1. Hyperlipidemia: He has been unable to tolerate statins.  2. Chronic atrial bigeminy: 25 plus  years.  3. Hypothyroidism: s/p surgery for pituitary adenoma.  4. Pituitary adenoma: s/p surgery in 3/11 for removal. Now on thyroid replacement and hydrocortisone. Had apparently a hypocorticoid crisis , [name] Forde Dandy.  5. Cardiomyopathy: Nonischemic. LHC (2009) with 50% mid LAD, 60% distal LAD. Left ventriculogram (2009) with EF 40-45%. Echo (8/13) with EF 35-40%, mild LVH. Echo (11/14) with EF 40%, diffuse hypokinesis, normal RV. He was tried in the past on an ACEI but apparently at that time began to have side effects from his pituitary adenoma that were thought initially to be ACEI-related. He has been off ACEI since that time. He has had trouble tolerating more that 1/2 tab of 3.125 mg Coreg once a day. Echo (11/15) with EF 55-60%, aortic sclerosis without significant stenosis.  6. CAD: 2009 LHC with 50% mid, 60% distal LAD. NSTEMI 11/15 with 60-70% pLAD, totally  occluded LCx with DES to proximal LCx. EF 55-60% by LV-gram.  7. VERY Mild cognitive impairment- 26-30 MOCA .      After physical and neurologic examination, review of laboratory studies,  Personal review of imaging studies, reports of other /same  Imaging studies, Results of polysomnography/ neurophysiology testing and pre-existing records as far as provided in visit., my assessment is   1) MCI, borderline,  MOCA . Continues on  Detrol.  2) OCD- he sees no need to treat.    Plan:  Treatment plan and additional workup :  Follow yearly for [facility] and MMSE.  Mild cognitive impairment has a 7% conversion rate into more substantial forms of memory loss. This is a yearly conversion rate. [name] at this time is not considered demented. Detrol may be d/c if not needed for urgency.    Asencion Partridge Vanderbilt Ranieri MD  01/30/2017   CC: [name], [address], [place] [address]  [place]; [name], MD

## 2017-01-31 DIAGNOSIS — Z6830 Body mass index (BMI) 30.0-30.9, adult: Secondary | ICD-10-CM | POA: Diagnosis not present

## 2017-01-31 DIAGNOSIS — E784 Other hyperlipidemia: Secondary | ICD-10-CM | POA: Diagnosis not present

## 2017-02-13 DIAGNOSIS — E298 Other testicular dysfunction: Secondary | ICD-10-CM | POA: Diagnosis not present

## 2017-02-13 DIAGNOSIS — J3089 Other allergic rhinitis: Secondary | ICD-10-CM | POA: Diagnosis not present

## 2017-02-13 DIAGNOSIS — J301 Allergic rhinitis due to pollen: Secondary | ICD-10-CM | POA: Diagnosis not present

## 2017-02-13 DIAGNOSIS — R972 Elevated prostate specific antigen [PSA]: Secondary | ICD-10-CM | POA: Diagnosis not present

## 2017-02-13 DIAGNOSIS — J3081 Allergic rhinitis due to animal (cat) (dog) hair and dander: Secondary | ICD-10-CM | POA: Diagnosis not present

## 2017-02-26 DIAGNOSIS — J3089 Other allergic rhinitis: Secondary | ICD-10-CM | POA: Diagnosis not present

## 2017-02-26 DIAGNOSIS — J301 Allergic rhinitis due to pollen: Secondary | ICD-10-CM | POA: Diagnosis not present

## 2017-02-26 DIAGNOSIS — J3081 Allergic rhinitis due to animal (cat) (dog) hair and dander: Secondary | ICD-10-CM | POA: Diagnosis not present

## 2017-02-27 DIAGNOSIS — E784 Other hyperlipidemia: Secondary | ICD-10-CM | POA: Diagnosis not present

## 2017-02-27 DIAGNOSIS — R351 Nocturia: Secondary | ICD-10-CM | POA: Diagnosis not present

## 2017-02-27 DIAGNOSIS — R8299 Other abnormal findings in urine: Secondary | ICD-10-CM | POA: Diagnosis not present

## 2017-02-27 DIAGNOSIS — N390 Urinary tract infection, site not specified: Secondary | ICD-10-CM | POA: Diagnosis not present

## 2017-02-27 DIAGNOSIS — E559 Vitamin D deficiency, unspecified: Secondary | ICD-10-CM | POA: Diagnosis not present

## 2017-02-27 DIAGNOSIS — R350 Frequency of micturition: Secondary | ICD-10-CM | POA: Diagnosis not present

## 2017-02-27 DIAGNOSIS — E038 Other specified hypothyroidism: Secondary | ICD-10-CM | POA: Diagnosis not present

## 2017-02-27 DIAGNOSIS — N179 Acute kidney failure, unspecified: Secondary | ICD-10-CM | POA: Diagnosis not present

## 2017-02-27 DIAGNOSIS — N401 Enlarged prostate with lower urinary tract symptoms: Secondary | ICD-10-CM | POA: Diagnosis not present

## 2017-02-27 DIAGNOSIS — R7301 Impaired fasting glucose: Secondary | ICD-10-CM | POA: Diagnosis not present

## 2017-03-05 DIAGNOSIS — E784 Other hyperlipidemia: Secondary | ICD-10-CM | POA: Diagnosis not present

## 2017-03-05 DIAGNOSIS — Z6832 Body mass index (BMI) 32.0-32.9, adult: Secondary | ICD-10-CM | POA: Diagnosis not present

## 2017-03-06 DIAGNOSIS — Z1389 Encounter for screening for other disorder: Secondary | ICD-10-CM | POA: Diagnosis not present

## 2017-03-06 DIAGNOSIS — E7800 Pure hypercholesterolemia, unspecified: Secondary | ICD-10-CM | POA: Diagnosis not present

## 2017-03-06 DIAGNOSIS — N390 Urinary tract infection, site not specified: Secondary | ICD-10-CM | POA: Diagnosis not present

## 2017-03-06 DIAGNOSIS — Z6831 Body mass index (BMI) 31.0-31.9, adult: Secondary | ICD-10-CM | POA: Diagnosis not present

## 2017-03-06 DIAGNOSIS — I2510 Atherosclerotic heart disease of native coronary artery without angina pectoris: Secondary | ICD-10-CM | POA: Diagnosis not present

## 2017-03-06 DIAGNOSIS — Z0000 Encounter for general adult medical examination without abnormal findings: Secondary | ICD-10-CM | POA: Diagnosis not present

## 2017-03-06 DIAGNOSIS — E298 Other testicular dysfunction: Secondary | ICD-10-CM | POA: Diagnosis not present

## 2017-03-06 DIAGNOSIS — R972 Elevated prostate specific antigen [PSA]: Secondary | ICD-10-CM | POA: Diagnosis not present

## 2017-03-06 DIAGNOSIS — I951 Orthostatic hypotension: Secondary | ICD-10-CM | POA: Diagnosis not present

## 2017-03-06 DIAGNOSIS — E2740 Unspecified adrenocortical insufficiency: Secondary | ICD-10-CM | POA: Diagnosis not present

## 2017-03-06 DIAGNOSIS — R413 Other amnesia: Secondary | ICD-10-CM | POA: Diagnosis not present

## 2017-03-06 DIAGNOSIS — I779 Disorder of arteries and arterioles, unspecified: Secondary | ICD-10-CM | POA: Diagnosis not present

## 2017-03-12 DIAGNOSIS — J3089 Other allergic rhinitis: Secondary | ICD-10-CM | POA: Diagnosis not present

## 2017-03-12 DIAGNOSIS — J301 Allergic rhinitis due to pollen: Secondary | ICD-10-CM | POA: Diagnosis not present

## 2017-03-12 DIAGNOSIS — J3081 Allergic rhinitis due to animal (cat) (dog) hair and dander: Secondary | ICD-10-CM | POA: Diagnosis not present

## 2017-03-19 ENCOUNTER — Other Ambulatory Visit (HOSPITAL_COMMUNITY): Payer: Self-pay | Admitting: Cardiology

## 2017-03-20 DIAGNOSIS — Z1212 Encounter for screening for malignant neoplasm of rectum: Secondary | ICD-10-CM | POA: Diagnosis not present

## 2017-03-20 DIAGNOSIS — Z1211 Encounter for screening for malignant neoplasm of colon: Secondary | ICD-10-CM | POA: Diagnosis not present

## 2017-03-26 DIAGNOSIS — J3081 Allergic rhinitis due to animal (cat) (dog) hair and dander: Secondary | ICD-10-CM | POA: Diagnosis not present

## 2017-03-26 DIAGNOSIS — J301 Allergic rhinitis due to pollen: Secondary | ICD-10-CM | POA: Diagnosis not present

## 2017-03-26 DIAGNOSIS — J3089 Other allergic rhinitis: Secondary | ICD-10-CM | POA: Diagnosis not present

## 2017-03-27 DIAGNOSIS — E298 Other testicular dysfunction: Secondary | ICD-10-CM | POA: Diagnosis not present

## 2017-03-28 DIAGNOSIS — J3089 Other allergic rhinitis: Secondary | ICD-10-CM | POA: Diagnosis not present

## 2017-03-28 DIAGNOSIS — J301 Allergic rhinitis due to pollen: Secondary | ICD-10-CM | POA: Diagnosis not present

## 2017-03-28 DIAGNOSIS — J3081 Allergic rhinitis due to animal (cat) (dog) hair and dander: Secondary | ICD-10-CM | POA: Diagnosis not present

## 2017-04-02 DIAGNOSIS — J3089 Other allergic rhinitis: Secondary | ICD-10-CM | POA: Diagnosis not present

## 2017-04-02 DIAGNOSIS — J301 Allergic rhinitis due to pollen: Secondary | ICD-10-CM | POA: Diagnosis not present

## 2017-04-02 DIAGNOSIS — J3081 Allergic rhinitis due to animal (cat) (dog) hair and dander: Secondary | ICD-10-CM | POA: Diagnosis not present

## 2017-04-11 ENCOUNTER — Other Ambulatory Visit (HOSPITAL_COMMUNITY): Payer: Self-pay | Admitting: Cardiology

## 2017-04-11 DIAGNOSIS — I2510 Atherosclerotic heart disease of native coronary artery without angina pectoris: Secondary | ICD-10-CM

## 2017-04-11 DIAGNOSIS — I428 Other cardiomyopathies: Secondary | ICD-10-CM

## 2017-04-11 MED ORDER — CARVEDILOL 3.125 MG PO TABS: 1.5625 mg | ORAL_TABLET | Freq: Every day | ORAL | 6 refills | Status: DC

## 2017-04-15 DIAGNOSIS — J301 Allergic rhinitis due to pollen: Secondary | ICD-10-CM | POA: Diagnosis not present

## 2017-04-15 DIAGNOSIS — J3089 Other allergic rhinitis: Secondary | ICD-10-CM | POA: Diagnosis not present

## 2017-04-15 DIAGNOSIS — J3081 Allergic rhinitis due to animal (cat) (dog) hair and dander: Secondary | ICD-10-CM | POA: Diagnosis not present

## 2017-04-17 DIAGNOSIS — E298 Other testicular dysfunction: Secondary | ICD-10-CM | POA: Diagnosis not present

## 2017-05-06 DIAGNOSIS — J3089 Other allergic rhinitis: Secondary | ICD-10-CM | POA: Diagnosis not present

## 2017-05-06 DIAGNOSIS — J301 Allergic rhinitis due to pollen: Secondary | ICD-10-CM | POA: Diagnosis not present

## 2017-05-06 DIAGNOSIS — J3081 Allergic rhinitis due to animal (cat) (dog) hair and dander: Secondary | ICD-10-CM | POA: Diagnosis not present

## 2017-05-08 DIAGNOSIS — J3089 Other allergic rhinitis: Secondary | ICD-10-CM | POA: Diagnosis not present

## 2017-05-08 DIAGNOSIS — J301 Allergic rhinitis due to pollen: Secondary | ICD-10-CM | POA: Diagnosis not present

## 2017-05-08 DIAGNOSIS — J3081 Allergic rhinitis due to animal (cat) (dog) hair and dander: Secondary | ICD-10-CM | POA: Diagnosis not present

## 2017-05-09 DIAGNOSIS — E298 Other testicular dysfunction: Secondary | ICD-10-CM | POA: Diagnosis not present

## 2017-05-13 DIAGNOSIS — J3089 Other allergic rhinitis: Secondary | ICD-10-CM | POA: Diagnosis not present

## 2017-05-13 DIAGNOSIS — J3081 Allergic rhinitis due to animal (cat) (dog) hair and dander: Secondary | ICD-10-CM | POA: Diagnosis not present

## 2017-05-13 DIAGNOSIS — J301 Allergic rhinitis due to pollen: Secondary | ICD-10-CM | POA: Diagnosis not present

## 2017-05-15 DIAGNOSIS — Z13820 Encounter for screening for osteoporosis: Secondary | ICD-10-CM | POA: Diagnosis not present

## 2017-05-15 DIAGNOSIS — Z79899 Other long term (current) drug therapy: Secondary | ICD-10-CM | POA: Diagnosis not present

## 2017-05-20 DIAGNOSIS — J301 Allergic rhinitis due to pollen: Secondary | ICD-10-CM | POA: Diagnosis not present

## 2017-05-20 DIAGNOSIS — J3081 Allergic rhinitis due to animal (cat) (dog) hair and dander: Secondary | ICD-10-CM | POA: Diagnosis not present

## 2017-05-20 DIAGNOSIS — J3089 Other allergic rhinitis: Secondary | ICD-10-CM | POA: Diagnosis not present

## 2017-05-24 DIAGNOSIS — M79605 Pain in left leg: Secondary | ICD-10-CM | POA: Diagnosis not present

## 2017-05-24 DIAGNOSIS — J3081 Allergic rhinitis due to animal (cat) (dog) hair and dander: Secondary | ICD-10-CM | POA: Diagnosis not present

## 2017-05-24 DIAGNOSIS — J301 Allergic rhinitis due to pollen: Secondary | ICD-10-CM | POA: Diagnosis not present

## 2017-05-24 DIAGNOSIS — I8310 Varicose veins of unspecified lower extremity with inflammation: Secondary | ICD-10-CM | POA: Diagnosis not present

## 2017-05-24 DIAGNOSIS — I779 Disorder of arteries and arterioles, unspecified: Secondary | ICD-10-CM | POA: Diagnosis not present

## 2017-05-24 DIAGNOSIS — R600 Localized edema: Secondary | ICD-10-CM | POA: Diagnosis not present

## 2017-05-24 DIAGNOSIS — Z6831 Body mass index (BMI) 31.0-31.9, adult: Secondary | ICD-10-CM | POA: Diagnosis not present

## 2017-05-24 DIAGNOSIS — J3089 Other allergic rhinitis: Secondary | ICD-10-CM | POA: Diagnosis not present

## 2017-05-30 DIAGNOSIS — J3089 Other allergic rhinitis: Secondary | ICD-10-CM | POA: Diagnosis not present

## 2017-05-30 DIAGNOSIS — J301 Allergic rhinitis due to pollen: Secondary | ICD-10-CM | POA: Diagnosis not present

## 2017-05-30 DIAGNOSIS — J3081 Allergic rhinitis due to animal (cat) (dog) hair and dander: Secondary | ICD-10-CM | POA: Diagnosis not present

## 2017-06-13 DIAGNOSIS — H5202 Hypermetropia, left eye: Secondary | ICD-10-CM | POA: Diagnosis not present

## 2017-06-13 DIAGNOSIS — J3089 Other allergic rhinitis: Secondary | ICD-10-CM | POA: Diagnosis not present

## 2017-06-13 DIAGNOSIS — J301 Allergic rhinitis due to pollen: Secondary | ICD-10-CM | POA: Diagnosis not present

## 2017-06-13 DIAGNOSIS — Z961 Presence of intraocular lens: Secondary | ICD-10-CM | POA: Diagnosis not present

## 2017-06-13 DIAGNOSIS — H5211 Myopia, right eye: Secondary | ICD-10-CM | POA: Diagnosis not present

## 2017-06-13 DIAGNOSIS — H4720 Unspecified optic atrophy: Secondary | ICD-10-CM | POA: Diagnosis not present

## 2017-06-13 DIAGNOSIS — J3081 Allergic rhinitis due to animal (cat) (dog) hair and dander: Secondary | ICD-10-CM | POA: Diagnosis not present

## 2017-06-16 ENCOUNTER — Other Ambulatory Visit: Payer: Self-pay | Admitting: Cardiology

## 2017-06-19 DIAGNOSIS — E291 Testicular hypofunction: Secondary | ICD-10-CM | POA: Diagnosis not present

## 2017-06-25 DIAGNOSIS — C44729 Squamous cell carcinoma of skin of left lower limb, including hip: Secondary | ICD-10-CM | POA: Diagnosis not present

## 2017-06-25 DIAGNOSIS — L565 Disseminated superficial actinic porokeratosis (DSAP): Secondary | ICD-10-CM | POA: Diagnosis not present

## 2017-06-25 DIAGNOSIS — Z85828 Personal history of other malignant neoplasm of skin: Secondary | ICD-10-CM | POA: Diagnosis not present

## 2017-06-25 DIAGNOSIS — D485 Neoplasm of uncertain behavior of skin: Secondary | ICD-10-CM | POA: Diagnosis not present

## 2017-07-10 DIAGNOSIS — E298 Other testicular dysfunction: Secondary | ICD-10-CM | POA: Diagnosis not present

## 2017-07-15 DIAGNOSIS — Z23 Encounter for immunization: Secondary | ICD-10-CM | POA: Diagnosis not present

## 2017-07-15 DIAGNOSIS — E7849 Other hyperlipidemia: Secondary | ICD-10-CM | POA: Diagnosis not present

## 2017-07-15 DIAGNOSIS — D751 Secondary polycythemia: Secondary | ICD-10-CM | POA: Diagnosis not present

## 2017-07-15 DIAGNOSIS — R972 Elevated prostate specific antigen [PSA]: Secondary | ICD-10-CM | POA: Diagnosis not present

## 2017-07-15 DIAGNOSIS — J3081 Allergic rhinitis due to animal (cat) (dog) hair and dander: Secondary | ICD-10-CM | POA: Diagnosis not present

## 2017-07-15 DIAGNOSIS — E781 Pure hyperglyceridemia: Secondary | ICD-10-CM | POA: Diagnosis not present

## 2017-07-15 DIAGNOSIS — I255 Ischemic cardiomyopathy: Secondary | ICD-10-CM | POA: Diagnosis not present

## 2017-07-15 DIAGNOSIS — E038 Other specified hypothyroidism: Secondary | ICD-10-CM | POA: Diagnosis not present

## 2017-07-15 DIAGNOSIS — E559 Vitamin D deficiency, unspecified: Secondary | ICD-10-CM | POA: Diagnosis not present

## 2017-07-15 DIAGNOSIS — I951 Orthostatic hypotension: Secondary | ICD-10-CM | POA: Diagnosis not present

## 2017-07-15 DIAGNOSIS — J3089 Other allergic rhinitis: Secondary | ICD-10-CM | POA: Diagnosis not present

## 2017-07-15 DIAGNOSIS — D352 Benign neoplasm of pituitary gland: Secondary | ICD-10-CM | POA: Diagnosis not present

## 2017-07-15 DIAGNOSIS — E291 Testicular hypofunction: Secondary | ICD-10-CM | POA: Diagnosis not present

## 2017-07-15 DIAGNOSIS — J301 Allergic rhinitis due to pollen: Secondary | ICD-10-CM | POA: Diagnosis not present

## 2017-07-15 DIAGNOSIS — E2749 Other adrenocortical insufficiency: Secondary | ICD-10-CM | POA: Diagnosis not present

## 2017-07-30 ENCOUNTER — Encounter (HOSPITAL_COMMUNITY): Payer: Self-pay | Admitting: Cardiology

## 2017-07-30 ENCOUNTER — Ambulatory Visit (HOSPITAL_COMMUNITY)
Admission: RE | Admit: 2017-07-30 | Discharge: 2017-07-30 | Disposition: A | Discharge status: Home / Self Care | Payer: Medicare Other | Source: Ambulatory Visit | Attending: Cardiology | Admitting: Cardiology

## 2017-07-30 VITALS — BP 134/72 | HR 63 | Wt 250.5 lb

## 2017-07-30 DIAGNOSIS — I6529 Occlusion and stenosis of unspecified carotid artery: Secondary | ICD-10-CM | POA: Diagnosis not present

## 2017-07-30 DIAGNOSIS — Z8249 Family history of ischemic heart disease and other diseases of the circulatory system: Secondary | ICD-10-CM | POA: Insufficient documentation

## 2017-07-30 DIAGNOSIS — Z9889 Other specified postprocedural states: Secondary | ICD-10-CM | POA: Insufficient documentation

## 2017-07-30 DIAGNOSIS — E785 Hyperlipidemia, unspecified: Secondary | ICD-10-CM | POA: Insufficient documentation

## 2017-07-30 DIAGNOSIS — E039 Hypothyroidism, unspecified: Secondary | ICD-10-CM | POA: Diagnosis not present

## 2017-07-30 DIAGNOSIS — I429 Cardiomyopathy, unspecified: Secondary | ICD-10-CM | POA: Diagnosis not present

## 2017-07-30 DIAGNOSIS — I252 Old myocardial infarction: Secondary | ICD-10-CM | POA: Insufficient documentation

## 2017-07-30 DIAGNOSIS — Z79899 Other long term (current) drug therapy: Secondary | ICD-10-CM | POA: Insufficient documentation

## 2017-07-30 DIAGNOSIS — I2510 Atherosclerotic heart disease of native coronary artery without angina pectoris: Secondary | ICD-10-CM

## 2017-07-30 DIAGNOSIS — I428 Other cardiomyopathies: Secondary | ICD-10-CM | POA: Diagnosis not present

## 2017-07-30 DIAGNOSIS — Z7982 Long term (current) use of aspirin: Secondary | ICD-10-CM | POA: Insufficient documentation

## 2017-07-30 DIAGNOSIS — G3184 Mild cognitive impairment, so stated: Secondary | ICD-10-CM | POA: Insufficient documentation

## 2017-07-30 DIAGNOSIS — M791 Myalgia, unspecified site: Secondary | ICD-10-CM | POA: Insufficient documentation

## 2017-07-30 LAB — LIPID PANEL
CHOLESTEROL: 204 mg/dL — AB (ref 0–200)
HDL: 43 mg/dL (ref >40)
LDL Cholesterol: 130 mg/dL — ABNORMAL HIGH (ref 0–99)
Total CHOL/HDL Ratio: 4.7 RATIO
Triglycerides: 157 mg/dL — ABNORMAL HIGH (ref <150)
VLDL: 31 mg/dL (ref 0–40)

## 2017-07-30 MED ORDER — CARVEDILOL 3.125 MG PO TABS: 1.5625 mg | ORAL_TABLET | Freq: Two times a day (BID) | ORAL | 6 refills | Status: DC

## 2017-07-30 NOTE — Progress Notes (Addendum)
Patient ID: [name], [name], male   DOB: [birth year], 83 y.o.   MRN: [id number]     Advanced Heart Failure Clinic Note   PCP: 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 male  with history of pituitary adenoma s/p resection, nonobstructive CAD, nonischemic cardiomyopathy, and chronic atrial bigeminy presents for cardiology followup.  Patient has a long history of atrial bigeminy.  He is in this today.  He had had a nonischemic cardiomyopathy with EF around 40% but most recent echo showed normal EF. Historically, he has not been able to tolerate statins, and he has been tried on multiple agents. He is now on Repatha.   In 11/15, he developed chest tightness and was admitted to [facility] with NSTEMI.  LHC showed 60-70% proximal LAD stenosis with totally occluded LCx.  This was opened with DES to the proximal LCx.  Echo post-cath showed EF 55-60%.  In 10/16, he developed typical angina.  LHC showed 95% proximal LAD stenosis that was treated with DES.    He can only tolerate Coreg 3.125 mg 1/2 tab twice a day.  He has had difficulty with BP-active meds in the setting of adrenal insufficiency post pituitary surgery.   Admitted 11/2016 for syncope. Thought to be in setting of adrenal insufficiency complicated by viral gastroenteritis.   He presents today for regular follow up. He is feeling good overall. Main complaint is fatigue. His cortef was increased after his episode in March. [place] did [place] one episode [place] weeks [place]. Was very hot and sweaty and had been working. Had a feeling of malaise and had to lean against the fence. He lost consciousness and is unsure how long he was down. He felt much better once he sat in his truck and turned on the air conditioner. He took extra cortisone for the next 3 doses and discussed with his PCP who recommended he go back to his 20 mg BID dose. He gets SOB with moderate exertion, but feels like it is within normal limits for his age. Denies chest  pain. 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   Echo 12/03/2016 LVEF 60-65%, Grade 1 DD, Mod LAE  Labs (5/12): K 4.3, creatinine 1.03 Labs (2/15): K 4, creatinine 1.1, LDL 184 Labs (11/15): K 4.8, creatinine 1.07 Labs (3/16): K 4.4, creatinine 0.9 Labs (4/16): LDL 80, HDL 37 (on Repatha) Labs (5/16): HCT 42.7 Labs (10/16): creatinine 1.05 Labs (11/16): LDL 77, LDL-P 1283 Labs (6/18): LDL 119, HDL 42, K 4.1, creatinine 1.2  ECG (personally reviewed): NSR, normal  PMH: 1. Hyperlipidemia: He has been unable to tolerate statins.  2. Chronic atrial bigeminy: x years.  3. Hypothyroidism: s/p surgery for pituitary adenoma.  4. Pituitary adenoma: s/p surgery in 3/11 for removal.  Now on thyroid replacement and hydrocortisone.  5. Cardiomyopathy: Nonischemic.  LHC (2009) with 50% mid LAD, 60% distal LAD.  Left ventriculogram (2009) with EF 40-45%.  Echo (8/13) with EF 35-40%, mild LVH.  Echo (11/14) with EF 40%, diffuse hypokinesis, normal RV.  He was tried in the past on an ACEI but apparently at that time began to have side effects from his pituitary adenoma that were thought initially to be ACEI-related.   He has been off ACEI since that time.  He has had trouble tolerating more that 1/2 tab of 3.125 mg Coreg once a day. Echo (11/15) with EF 55-60%, aortic sclerosis without  significant stenosis.  - Echo (3/18): EF 60-65%, no significant valvular abnormalities.  6. CAD: 2009 LHC with 50% mid, 60% distal LAD. NSTEMI 11/15 with 60-70% pLAD, totally occluded LCx with DES to proximal LCx.  EF 55-60% by LV-gram.  Angina with LHC 10/16 showing 95% pLAD, 40% mLAD, 40% dLAD, 70% AM2 => treated with DES to pLAD.  7. Mild cognitive impairment.  8. Carotid stenosis: Carotid dopplers (1/16) with 40-59% RICA stenosis.  - Carotid dopplers (1/17): Mild plaque only.   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   FH: Brother with CAD.  Review of systems  complete and found to be negative unless listed in HPI.    Current Outpatient Prescriptions  Medication Sig Dispense Refill  . alfuzosin (UROXATRAL) 10 MG 24 hr tablet Take 1 tablet (10 mg total) by mouth daily with breakfast. 30 tablet 2  . ALPRAZolam (XANAX) 0.5 MG tablet Take 0.5 mg by mouth as needed for anxiety.     Marland Kitchen aspirin 81 MG tablet Take 81 mg by mouth daily.      . carvedilol (COREG) 3.125 MG tablet Take 0.5 tablets (1.5625 mg total) by mouth at bedtime. 45 tablet 6  . Coenzyme Q10 (COQ10) 200 MG CAPS Take 1 capsule by mouth daily.      Marland Kitchen EPIPEN 2-PAK 0.3 MG/0.3ML SOAJ injection Inject 1 Dose as directed as needed.  0  . ergocalciferol (VITAMIN D2) 50000 UNITS capsule Take 50,000 Units by mouth 2 (two) times a week.     . Evolocumab (REPATHA) 140 MG/ML SOSY Inject into the skin every 14 (fourteen) days.    . finasteride (PROSCAR) 5 MG tablet Take 5 mg by mouth daily.      . hydrocortisone (CORTEF) 10 MG tablet Take 20 mg by mouth 2 (two) times daily.    Marland Kitchen levothyroxine (SYNTHROID, LEVOTHROID) 150 MCG tablet Take 150 mcg by mouth daily before breakfast.     . MYRBETRIQ 50 MG TB24 tablet Take 50 mg by mouth daily.  0  . nitroGLYCERIN (NITROSTAT) 0.4 MG SL tablet Place 1 tablet (0.4 mg total) under the tongue every 5 (five) minutes as needed for chest pain. 90 tablet 3  . Omega-3 1000 MG CAPS Take 1 capsule (1,000 mg total) by mouth 2 (two) times daily. 60 capsule 11  . omeprazole (PRILOSEC) 40 MG capsule Take 40 mg by mouth daily.  1  . Propylene Glycol (SYSTANE BALANCE OP) Apply 2 drops to eye daily as needed (dry eye).    . Red Yeast Rice Extract 600 MG CAPS Take 600 mg by mouth daily.    . tadalafil (CIALIS) 5 MG tablet Take 1 tablet (5 mg total) by mouth daily as needed for erectile dysfunction. Do not take nitro within 48 hrs after taking cialis 10 tablet 1  . testosterone cypionate (DEPOTESTOTERONE CYPIONATE) 200 MG/ML injection Inject into the muscle every 21 ( twenty-one)  days. 1.62 % injection    . ticagrelor (BRILINTA) 60 MG TABS tablet Take 1 tablet (60 mg total) by mouth 2 (two) times daily. Needs an office visit 180 tablet 3   No current facility-administered medications for this encounter.     BP 134/72   Pulse 63   Wt 250 lb 8 oz (113.6 kg)   SpO2 98%   BMI 31.31 kg/m    General: NAD. No resp difficulty. HEENT: Normal Neck: Supple. JVP 5-6. Carotids 2+ bilat; no bruits. No thyromegaly or nodule noted. Cor: PMI nondisplaced. RRR, No  M/G/R noted Lungs: CTAB, normal effort. Abdomen: Soft, non-tender, non-distended, no HSM. No bruits or masses. +BS  Extremities: No cyanosis, clubbing, or rash. Trace to 1+ ankle edema.   Neuro: Alert & orientedx3, cranial nerves grossly intact. moves all 4 extremities w/o difficulty. Affect pleasant   Assessment/Plan: 1. Cardiomyopathy: EF 40% with diffuse hypokinesis on 11/14 echo, but most recent echo showed EF up to 60-65% (3/18).  He is euvolemic with NYHA class I symptoms.  Continue Coreg at 3.125 1/2 tab bid (increase back to this dose, has been taking once daily).    2. CAD: s/p NSTEMI with DES to LCx then angina with DES to pLAD.  He has not tolerated multiple statins but is now on Repatha.   - Echo 12/03/2016 LVEF 60-65%, Grade 1 DD, Mod LAE - No s/s of ischemia.     - Will continue ASA 81 and Brilinta 60 mg BID. - Increase Coreg back to to 3.125 mg 1/2 tab BID - Continue Repatha. Lipids today.  3. Hyperlipidemia: He has not tolerated multiple statins. Has previously failed Crestor 5 mg two times/week due to myalgias.   - Continue Repatha. Check lipids today.  4. Carotid bruit:  - 10/2016 dopplers with minimal stenosis. Unchanged from previous.  5. Atrial bigeminy:  - Chronic in past but no PACs noted on today's ECG.  - EF normal on most recent echo as above.  - He does not tolerate higher doses of beta blockers.   RTC 6 months. Labs and meds as above.   Satira Mccallum Tillery 07/30/2017  Patient  seen with PA, agree with the above note.    No chest pain, continue ASA and ticagrelor 60 bid.   He is only taking Coreg 3.125 1/2 tablet daily, not sure why he stopped bid.  Increase back to bid.   He has been chronically in atrial bigeminy in the past but today is NSR with no PACs.    LDL was high in 6/18, not sure why it is up so much when he is still on Repatha.  Will resend lipids today.  If high, will add Zetia but need to make sure he is taking Repatha correctly.   [name] 07/31/2017 10:14 AM

## 2017-07-30 NOTE — Patient Instructions (Signed)
Increase Carvedilol to 1/2 tab, twice a day  Labs drawn today (if we do not call you, then your lab work was stable)   Your physician recommends that you schedule a follow-up appointment in: 6 months (we will call you)

## 2017-07-31 ENCOUNTER — Encounter (HOSPITAL_COMMUNITY): Payer: Self-pay

## 2017-07-31 ENCOUNTER — Telehealth (HOSPITAL_COMMUNITY): Payer: Self-pay

## 2017-07-31 DIAGNOSIS — E785 Hyperlipidemia, unspecified: Secondary | ICD-10-CM

## 2017-07-31 MED ORDER — EZETIMIBE 10 MG PO TABS: 10.0000 mg | ORAL_TABLET | Freq: Every day | ORAL | 3 refills | Status: DC

## 2017-07-31 NOTE — Addendum Note (Signed)
Encounter addended by: [name], MD on: 07/31/2017 10:16 AM<BR>    Actions taken: Sign clinical note

## 2017-07-31 NOTE — Telephone Encounter (Signed)
Notes recorded by Shirley Muscat, RN on 07/31/2017 at 12:24 PM EDT Pt aware of results, Rx for Zetia sent in, referral to lipid clinic ordered, and labs order placed along with Appt scheduled on 09/29/17   ------  Notes recorded by [name], MD on 07/30/2017 at 4:00 PM EDT LDL is too high. Would have him go back to lipid clinic, make sure he is taking Repatha correctly. Would also start him on ezetimibe (Zetia) 10 mg daily. Lipids/LFTs in 2 months

## 2017-08-01 DIAGNOSIS — E298 Other testicular dysfunction: Secondary | ICD-10-CM | POA: Diagnosis not present

## 2017-08-07 ENCOUNTER — Encounter: Payer: Self-pay | Admitting: Neurology

## 2017-08-08 ENCOUNTER — Encounter: Payer: Self-pay | Admitting: Neurology

## 2017-08-08 ENCOUNTER — Ambulatory Visit (INDEPENDENT_AMBULATORY_CARE_PROVIDER_SITE_OTHER): Payer: Medicare Other | Admitting: Neurology

## 2017-08-08 VITALS — BP 160/94 | HR 59 | Ht 75.0 in | Wt 251.0 lb

## 2017-08-08 DIAGNOSIS — I208 Other forms of angina pectoris: Secondary | ICD-10-CM | POA: Diagnosis not present

## 2017-08-08 DIAGNOSIS — G3184 Mild cognitive impairment, so stated: Secondary | ICD-10-CM | POA: Diagnosis not present

## 2017-08-08 DIAGNOSIS — I209 Angina pectoris, unspecified: Secondary | ICD-10-CM

## 2017-08-08 DIAGNOSIS — I453 Trifascicular block: Secondary | ICD-10-CM

## 2017-08-08 NOTE — Progress Notes (Signed)
Provider:  Larey Olsen, M D  Referring Provider: [name], MD Primary Care Physician:  [name] Infante, MD  Chief Complaint  Patient presents with  . Follow-up    pt alone, rm 11. pt states that everything is the same.     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  Interval history from 08 August 2017.  I have the pleasure of seeing [name] today whom I follow on every 6 month intervals for memory testing.  He again aced the [facility] cognitive assessment today a 28 out of 30 points.  There is no sign of cognitive deficits.  His hearing loss has been well compensated with a hearing aid and we have a normal conversation.   01/13/14 Last visit with [name]: [name], a partially retired [name], is presenting here in the presence of his wife.  [name]. Yetta Olsen wife reported that her husbands personality  has changed, and she feels it is not longer all related to his severe hearing loss. He reports that he has noticed a slowing of his cognitive function rather than loss of memory for several years.  One of his concerns he is delayed recall of names, which sometimes causes him some embarrassment in social situations. 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  He also states that the use at times compulsively searching for these names and there may come back to his memory with delay.  He also a likes to walk or do some physical activity while thinking about these names.  He had no trouble getting lost while driving and he reports no difficulties with abstraction, attention, or mathematics at basic checkbook balancing etc. In his Montral cognitive assessment test here today he scored 25/30 points. 26/30 is considered the normal range. Based on this test he would be  diagnosed as a mild cognitive impairment and not with dementia.  His wife stated he had fallen 4 times and she added some important surgical information, [name] has a history of pituitary Macroadenoma, "the size of a golfball", with previously tunnel vision. [name] treated him.   07-27-15 ,Since last visit, [name]. Belenda Olsen has suffered a heart attack and is now in cardiac rehab, followed by [name]. feels as if there has been no change in his health or memory.  His wife agrees, but is not personally present. His  2015 MRI follow up study showed multiple foci of T2 hyperintensity are present in the subcortical and deep cerebral white matter, nonspecific but compatible with mild chronic small vessel ischemic disease and mild generalized cerebral atrophy.  Unchanged nodular tissue in the anterior sella and extending into the sphenoid sinus, compatible with residual pituitary adenoma, no change from imaging from [facility]. [name]. Belenda Olsen performed today with [place] in a Mini-Mental Status Examination it revealed 29 out of 30 points and his only missed take was that he couldn't name the exact date which is 07-27-15, This test was followed by a Montral cognitive assessment test and he scored here 25 out of 30 points. The most difficult part of the test are usually clock drawing, the Trail Making Test the serial sevens and the word recall. The patient lost 3 of 5 words  and word recall and did repeat one sentence incompletely. This may have been due to his hearing deficits also he is wearing a hearing aid. Visual spatial testing naming and calculation attention and abstraction as well as orientation were fully intact. He generated 41 F- words.   07/25/2016,  CD I had the pleasure of seeing [name]. Belenda Olsen today. A Montral cognitive assessment was performed and he scored 23 out of 30 points. It was date and month but gave him trouble he had very good visuospatial capacities and the ability to substrate and to spell.He noted  delayed recall of names.  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  The patient follows [place] for a mild cognitive impairment on a yearly interval. In 2016 he scored on an Mini-Mental exam 29 out of 30 points and on a Montral operative assessment 25 out of 30 points. Since then she has repeatedly performed Montral cognitive assessments and he has been in the normal range. Today again he scored 26 out of 30 points word fluency was 14, he recalled 5 out of 5 words could name all objects and was able to draw a clock face. He does have good visual spatial understanding, which was certainly necessary for his job. He was evaluated in hospital after a fall- he fell in his house at night face down. EMS was called after he reportedly couldn't feel the legs- couldn't get up. Endocrinologist diagnosed pituitary adrenal failure- treated with hydrocortisone, now on 20 and 10 mg. [name] will follow.     Montreal Cognitive Assessment  08/08/2017 01/30/2017 07/25/2016 07/27/2015 07/28/2014  Visuospatial/ Executive (0/5) 4 4 5 5 4   Naming (0/3) 3 3 3 3 3   Attention: Read list of digits (0/2) 2 2 2 2 2   Attention: Read list of letters (0/1) 1 1 1 1 1   Attention: Serial 7 subtraction starting at 100 (0/3) 3 3 2 3 3   Language: Repeat phrase (0/2) 2 1 2 1 2   Language : Fluency (0/1) 1 1 1 1 1   Abstraction (0/2) 2 2 2 2 2   Delayed Recall (0/5) 4 5 1 2 2   Orientation (0/6) 6 4 4 5 6   Total 28 26 23 25 26   Adjusted Score (based on education) - 26 23 25 26      Review of Systems:  Out of a complete 14 system review, the patient complains of only the following symptoms, and all other reviewed systems are negative.  Hoarse voice, intact delayed recall, trouble with trail making. right knee replacement.  TBI in a skiing accident in 2005. Hearing loss, hunting and hobby farming. Nocturia 2-3 times, OCD- compulsions are progressing according to his wife.  He does  not want to treat this condition.   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 . Drives.  Married with adult 2 children, both professionals. Non smoker, rare ETOH, some caffeine.     Social History   Socioeconomic History  . Marital status: Married    Spouse name: [name]  . Number of children: 2  . Years of education: College  . Highest education level: Not on file  Social Needs  . Financial resource strain: Not on file  . Food insecurity - worry: Not on file  . Food insecurity - inability: Not on file  . Transportation needs - medical: Not on file  . Transportation needs -  non-medical: Not on file  Occupational History  . Occupation: retired Programmer, multimedia: RETIRED  Tobacco Use  . Smoking status: Never Smoker  . Smokeless tobacco: Never Used  Substance and Sexual Activity  . Alcohol use: Yes    Comment: occas.  . Drug use: No  . Sexual activity: Not on file  Other Topics Concern  . Not on file  Social History Narrative   Patient is married [name]).   Patient has two children.   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   Patient is right-handed.   Patient does not drink any caffeine.    Family History  Problem Relation Age of Onset  . Congestive Heart Failure Father 55  . Other Mother 61  . Coronary artery disease Brother   . Other Sister 24       polio  . Heart attack Brother        MI/ASCAD 2006  . COPD Sister 61    Past Medical History:  Diagnosis Date  . Atrial premature beats   . BPH (benign prostatic hyperplasia)   . BPH (benign prostatic hypertrophy)   . Cataract, right eye   . Chronic low back pain   . Colon polyp   . Coronary artery disease   . DDD (degenerative disc disease), lumbar   . Dermatitis 11/16  . Hyperlipidemia   . Insomnia   . Kidney cysts   . Memory loss 2015  . Non-ischemic cardiomyopathy ([place])   . NSTEMI (non-ST elevated myocardial infarction)  ([place]) 08/25/2014  . Osteoarthritis of right knee   . Pituitary tumor 2011   macroadenoma w/ VF compromise-then gamma knife a few years leater for some recurrence  . Status post insertion of drug-eluting stent into left anterior descending (LAD) artery 08/28/15   + nuc study.   . Urinary retention   . Vitamin D deficiency 01/2008    Past Surgical History:  Procedure Laterality Date  . ARTHROSCOPIC SURGERY    . BACK SURGERY    . CARDIAC CATHETERIZATION  07/27/2015   [name] Aundra Dubin; oLAD 95%, mLAD 50%, CFX stent OK, OM1 60%, OM2 99%, pPLOM 50%, RCA irregular, AM2 70%, EF 55-60%, no regional wall motion abnormalities.     Marland Kitchen CATARACT EXTRACTION    . CORONARY ANGIOPLASTY WITH STENT PLACEMENT  07/27/2015   XIENCE ALPINE RX 3.0X33 DES to the LAD, 95%>>0  . HAMMER  BUNION TOE SURGERY    . HAND SURGERY Left   . PITUITARY SURGERY     12/01/09 [name]. Wilburn Cornelia and [name]. Ellene Olsen.    Current Outpatient Medications  Medication Sig Dispense Refill  . alfuzosin (UROXATRAL) 10 MG 24 hr tablet Take 1 tablet (10 mg total) by mouth daily with breakfast. 30 tablet 2  . ALPRAZolam (XANAX) 0.5 MG tablet Take 0.5 mg by mouth as needed for anxiety.     Marland Kitchen aspirin 81 MG tablet Take 81 mg by mouth daily.      . carvedilol (COREG) 3.125 MG tablet Take 0.5 tablets (1.5625 mg total) by mouth 2 (two) times daily. 15 tablet 6  . Coenzyme Q10 (COQ10) 200 MG CAPS Take 1 capsule by mouth daily.      Marland Kitchen EPIPEN 2-PAK 0.3 MG/0.3ML SOAJ injection Inject 1 Dose as directed as needed.  0  . ergocalciferol (VITAMIN D2) 50000 UNITS capsule Take 50,000 Units by mouth 2 (two) times a week.     . Evolocumab (REPATHA) 140 MG/ML SOSY Inject into the  skin every 14 (fourteen) days.    Marland Kitchen ezetimibe (ZETIA) 10 MG tablet Take 1 tablet (10 mg total) by mouth daily. 30 tablet 3  . finasteride (PROSCAR) 5 MG tablet Take 5 mg by mouth daily.      . hydrocortisone (CORTEF) 10 MG tablet Take 20 mg by mouth 2 (two) times daily.    Marland Kitchen levothyroxine  (SYNTHROID, LEVOTHROID) 150 MCG tablet Take 150 mcg by mouth daily before breakfast.     . MYRBETRIQ 50 MG TB24 tablet Take 50 mg by mouth daily.  0  . nitroGLYCERIN (NITROSTAT) 0.4 MG SL tablet Place 1 tablet (0.4 mg total) under the tongue every 5 (five) minutes as needed for chest pain. 90 tablet 3  . Omega-3 1000 MG CAPS Take 1 capsule (1,000 mg total) by mouth 2 (two) times daily. 60 capsule 11  . omeprazole (PRILOSEC) 40 MG capsule Take 40 mg by mouth daily.  1  . Propylene Glycol (SYSTANE BALANCE OP) Apply 2 drops to eye daily as needed (dry eye).    . Red Yeast Rice Extract 600 MG CAPS Take 600 mg by mouth daily.    . tadalafil (CIALIS) 5 MG tablet Take 1 tablet (5 mg total) by mouth daily as needed for erectile dysfunction. Do not take nitro within 48 hrs after taking cialis 10 tablet 1  . testosterone cypionate (DEPOTESTOTERONE CYPIONATE) 200 MG/ML injection Inject into the muscle every 21 ( twenty-one) days. 1.62 % injection    . ticagrelor (BRILINTA) 60 MG TABS tablet Take 1 tablet (60 mg total) by mouth 2 (two) times daily. Needs an office visit 180 tablet 3   No current facility-administered medications for this visit.     Allergies as of 08/08/2017 - Review Complete 08/08/2017  Allergen Reaction Noted  . Sulfamethoxazole Anaphylaxis and Swelling 09/19/2015  . Bee venom Rash 09/19/2015  . Sulfa antibiotics Nausea And Vomiting 07/15/2014  . Sulfonamide derivatives Nausea And Vomiting 02/10/2009  . Tamsulosin  02/10/2009    Vitals: BP (!) 160/94   Pulse (!) 59   Ht 6' 3"  (1.905 m)   Wt 251 lb (113.9 kg)   BMI 31.37 kg/m  Last Weight:  Wt Readings from Last 1 Encounters:  08/08/17 251 lb (113.9 kg)   MQK:MMNO mass index is 31.37 kg/m.     Last Height:   Ht Readings from Last 1 Encounters:  08/08/17 6' 3"  (1.905 m)    Physical exam:  General: The patient is awake, alert and appears not in acute distress. The patient is well groomed. Head: Normocephalic, atraumatic.  Cardiovascular:  Regular rate and rhythm , without  murmurs or carotid bruit, and without distended neck veins. Respiratory: Lungs are clear to auscultation. Skin:  Without evidence of edema, or rash Trunk: The patient's posture is erect, no scoliosis.   Neurologic exam : The patient is awake and alert, oriented to place and time.   Memory subjective  described as intact.  Memory testing revealed  29-30 points on MMSE and 23 out of 30 on MOCA, at best a MCI. Not a dementia.  OCD tendencies confirmed by the patient himself.   MOCA: Montreal Cognitive Assessment  08/08/2017 01/30/2017 07/25/2016 07/27/2015 07/28/2014  Visuospatial/ Executive (0/5) 4 4 5 5 4   Naming (0/3) 3 3 3 3 3   Attention: Read list of digits (0/2) 2 2 2 2 2   Attention: Read list of letters (0/1) 1 1 1 1 1   Attention: Serial 7 subtraction starting at 100 (0/3) 3  3 2 3 3   Language: Repeat phrase (0/2) 2 1 2 1 2   Language : Fluency (0/1) 1 1 1 1 1   Abstraction (0/2) 2 2 2 2 2   Delayed Recall (0/5) 4 5 1 2 2   Orientation (0/6) 6 4 4 5 6   Total 28 26 23 25 26   Adjusted Score (based on education) - 26 23 25 26    MMSE: MMSE - Mini Mental State Exam 07/27/2015  Orientation to time 4  Orientation to Place 5  Registration 3  Attention/ Calculation 5  Recall 3  Language- name 2 objects 2  Language- repeat 1  Language- follow 3 step command 3  Language- read & follow direction 1  Write a sentence 1  Copy design 1  Total score 29   Neck 16.5 inches. Mallampati 2.   Nasal septal deviation- nasal congestion.  Attention span & concentration ability appears normal.  Speech is fluent, without dysarthria, but with strong dysphonia - not aphasia.  Mood and affect are appropriate.  Cranial nerves: Pupils are equal in size and round, reactive to light. Extraocular movements  in vertical and horizontal planes intact and without nystagmus. Visual fields by finger perimetry are intact. Hearing; hearing aids in place. Facial  sensation intact to fine touch. Facial motor strength is symmetric and tongue and uvula move midline. Shoulder shrug was symmetrical.   Gait and station: Patient walks without assistive device and is able unassisted to climb up to the exam table. Strength within normal limits.  Stance is stable and normal based. The patient rises again from a seated position without bracing himself.  He stands erect. Toe and heel stand were tested and are normal. Tandem gait is unfragmented. Turns with 3 Steps. Romberg testing is negative.  Deep tendon reflexes: in the upper and lower extremities are symmetric and intact. Babinski maneuver response is  downgoing.  The patient was advised of the nature of the diagnosed sleep disorder , the treatment options and risks for general a health and wellness arising from not treating the condition.  I spent more than 25 minutes of face to face time with the patient. Greater than 50% of time was spent in counseling and coordination of care. We have discussed the diagnosis and differential and I answered the patient's questions.   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 He walks fast paced every day. He should continue.    Assessment:    After physical and neurologic examination, review of laboratory studies,  Personal review of imaging studies, reports of other /same  Imaging studies, Results of polysomnography/ neurophysiology testing and pre-existing records as far as provided in visit., my assessment is   1) MCI, borderline,  MOCA . Continues on  Detrol. 2) OCD- he sees no need to treat.  3) hearing loss    Plan:  Treatment plan and additional workup :  Follow yearly for [facility] and MMSE.  Mild cognitive impairment has a 7% conversion rate into more substantial forms of memory loss. This is a yearly conversion rate. [name]. Belenda Olsen at this time is not considered demented. Detrol may be d/c if not needed for  urgency.    Asencion Partridge Ondra Deboard MD  08/08/2017   CC: [name], [address], [place] [address]  [place]; [name], MD

## 2017-08-15 DIAGNOSIS — J301 Allergic rhinitis due to pollen: Secondary | ICD-10-CM | POA: Diagnosis not present

## 2017-08-15 DIAGNOSIS — J3089 Other allergic rhinitis: Secondary | ICD-10-CM | POA: Diagnosis not present

## 2017-08-15 DIAGNOSIS — J3081 Allergic rhinitis due to animal (cat) (dog) hair and dander: Secondary | ICD-10-CM | POA: Diagnosis not present

## 2017-08-21 DIAGNOSIS — E298 Other testicular dysfunction: Secondary | ICD-10-CM | POA: Diagnosis not present

## 2017-08-28 DIAGNOSIS — J3089 Other allergic rhinitis: Secondary | ICD-10-CM | POA: Diagnosis not present

## 2017-08-28 DIAGNOSIS — J301 Allergic rhinitis due to pollen: Secondary | ICD-10-CM | POA: Diagnosis not present

## 2017-08-28 DIAGNOSIS — J3081 Allergic rhinitis due to animal (cat) (dog) hair and dander: Secondary | ICD-10-CM | POA: Diagnosis not present

## 2017-09-02 DIAGNOSIS — D485 Neoplasm of uncertain behavior of skin: Secondary | ICD-10-CM | POA: Diagnosis not present

## 2017-09-02 DIAGNOSIS — Z85828 Personal history of other malignant neoplasm of skin: Secondary | ICD-10-CM | POA: Diagnosis not present

## 2017-09-02 DIAGNOSIS — C4441 Basal cell carcinoma of skin of scalp and neck: Secondary | ICD-10-CM | POA: Diagnosis not present

## 2017-09-05 DIAGNOSIS — Z5189 Encounter for other specified aftercare: Secondary | ICD-10-CM | POA: Diagnosis not present

## 2017-09-05 DIAGNOSIS — I2510 Atherosclerotic heart disease of native coronary artery without angina pectoris: Secondary | ICD-10-CM | POA: Diagnosis not present

## 2017-09-05 DIAGNOSIS — I10 Essential (primary) hypertension: Secondary | ICD-10-CM | POA: Diagnosis not present

## 2017-09-05 DIAGNOSIS — E038 Other specified hypothyroidism: Secondary | ICD-10-CM | POA: Diagnosis not present

## 2017-09-05 DIAGNOSIS — N3281 Overactive bladder: Secondary | ICD-10-CM | POA: Diagnosis not present

## 2017-09-05 DIAGNOSIS — Z6832 Body mass index (BMI) 32.0-32.9, adult: Secondary | ICD-10-CM | POA: Diagnosis not present

## 2017-09-05 DIAGNOSIS — Z1389 Encounter for screening for other disorder: Secondary | ICD-10-CM | POA: Diagnosis not present

## 2017-09-05 DIAGNOSIS — I779 Disorder of arteries and arterioles, unspecified: Secondary | ICD-10-CM | POA: Diagnosis not present

## 2017-09-05 DIAGNOSIS — E2740 Unspecified adrenocortical insufficiency: Secondary | ICD-10-CM | POA: Diagnosis not present

## 2017-09-11 DIAGNOSIS — Z5189 Encounter for other specified aftercare: Secondary | ICD-10-CM | POA: Diagnosis not present

## 2017-09-11 DIAGNOSIS — Z6832 Body mass index (BMI) 32.0-32.9, adult: Secondary | ICD-10-CM | POA: Diagnosis not present

## 2017-09-11 DIAGNOSIS — L0889 Other specified local infections of the skin and subcutaneous tissue: Secondary | ICD-10-CM | POA: Diagnosis not present

## 2017-09-11 DIAGNOSIS — I10 Essential (primary) hypertension: Secondary | ICD-10-CM | POA: Diagnosis not present

## 2017-09-11 DIAGNOSIS — E291 Testicular hypofunction: Secondary | ICD-10-CM | POA: Diagnosis not present

## 2017-09-17 DIAGNOSIS — D1801 Hemangioma of skin and subcutaneous tissue: Secondary | ICD-10-CM | POA: Diagnosis not present

## 2017-09-17 DIAGNOSIS — Z85820 Personal history of malignant melanoma of skin: Secondary | ICD-10-CM | POA: Diagnosis not present

## 2017-09-17 DIAGNOSIS — L570 Actinic keratosis: Secondary | ICD-10-CM | POA: Diagnosis not present

## 2017-09-17 DIAGNOSIS — Z85828 Personal history of other malignant neoplasm of skin: Secondary | ICD-10-CM | POA: Diagnosis not present

## 2017-09-18 DIAGNOSIS — Z85828 Personal history of other malignant neoplasm of skin: Secondary | ICD-10-CM | POA: Diagnosis not present

## 2017-09-18 DIAGNOSIS — C44319 Basal cell carcinoma of skin of other parts of face: Secondary | ICD-10-CM | POA: Diagnosis not present

## 2017-09-19 DIAGNOSIS — I87319 Chronic venous hypertension (idiopathic) with ulcer of unspecified lower extremity: Secondary | ICD-10-CM | POA: Diagnosis not present

## 2017-09-19 DIAGNOSIS — Z6832 Body mass index (BMI) 32.0-32.9, adult: Secondary | ICD-10-CM | POA: Diagnosis not present

## 2017-09-25 DIAGNOSIS — Z5189 Encounter for other specified aftercare: Secondary | ICD-10-CM | POA: Diagnosis not present

## 2017-09-25 DIAGNOSIS — I87319 Chronic venous hypertension (idiopathic) with ulcer of unspecified lower extremity: Secondary | ICD-10-CM | POA: Diagnosis not present

## 2017-09-25 DIAGNOSIS — Z6832 Body mass index (BMI) 32.0-32.9, adult: Secondary | ICD-10-CM | POA: Diagnosis not present

## 2017-09-30 ENCOUNTER — Ambulatory Visit (HOSPITAL_COMMUNITY)
Admission: RE | Admit: 2017-09-30 | Discharge: 2017-09-30 | Disposition: A | Discharge status: Home / Self Care | Payer: Medicare Other | Source: Ambulatory Visit | Attending: Cardiology | Admitting: Cardiology

## 2017-09-30 DIAGNOSIS — I87319 Chronic venous hypertension (idiopathic) with ulcer of unspecified lower extremity: Secondary | ICD-10-CM | POA: Diagnosis not present

## 2017-09-30 DIAGNOSIS — Z6831 Body mass index (BMI) 31.0-31.9, adult: Secondary | ICD-10-CM | POA: Diagnosis not present

## 2017-09-30 DIAGNOSIS — E785 Hyperlipidemia, unspecified: Secondary | ICD-10-CM | POA: Diagnosis not present

## 2017-09-30 LAB — HEPATIC FUNCTION PANEL
ALBUMIN: 3.6 g/dL (ref 3.5–5.0)
ALK PHOS: 68 U/L (ref 38–126)
ALT: 29 U/L (ref 17–63)
AST: 25 U/L (ref 15–41)
BILIRUBIN DIRECT: 0.1 mg/dL (ref 0.1–0.5)
BILIRUBIN TOTAL: 1.1 mg/dL (ref 0.3–1.2)
Indirect Bilirubin: 1 mg/dL — ABNORMAL HIGH (ref 0.3–0.9)
Total Protein: 6.2 g/dL — ABNORMAL LOW (ref 6.5–8.1)

## 2017-09-30 LAB — LIPID PANEL
CHOL/HDL RATIO: 3.1 ratio
Cholesterol: 138 mg/dL (ref 0–200)
HDL: 45 mg/dL (ref >40)
LDL Cholesterol: 70 mg/dL (ref 0–99)
Triglycerides: 116 mg/dL (ref <150)
VLDL: 23 mg/dL (ref 0–40)

## 2017-10-03 DIAGNOSIS — J3081 Allergic rhinitis due to animal (cat) (dog) hair and dander: Secondary | ICD-10-CM | POA: Diagnosis not present

## 2017-10-03 DIAGNOSIS — J301 Allergic rhinitis due to pollen: Secondary | ICD-10-CM | POA: Diagnosis not present

## 2017-10-03 DIAGNOSIS — J3089 Other allergic rhinitis: Secondary | ICD-10-CM | POA: Diagnosis not present

## 2017-10-04 ENCOUNTER — Encounter (HOSPITAL_COMMUNITY): Payer: Self-pay

## 2017-10-08 DIAGNOSIS — E298 Other testicular dysfunction: Secondary | ICD-10-CM | POA: Diagnosis not present

## 2017-10-11 DIAGNOSIS — H903 Sensorineural hearing loss, bilateral: Secondary | ICD-10-CM | POA: Diagnosis not present

## 2017-10-29 DIAGNOSIS — E298 Other testicular dysfunction: Secondary | ICD-10-CM | POA: Diagnosis not present

## 2017-11-15 ENCOUNTER — Other Ambulatory Visit (HOSPITAL_COMMUNITY): Payer: Self-pay | Admitting: *Deleted

## 2017-11-15 DIAGNOSIS — I428 Other cardiomyopathies: Secondary | ICD-10-CM

## 2017-11-15 DIAGNOSIS — I2510 Atherosclerotic heart disease of native coronary artery without angina pectoris: Secondary | ICD-10-CM

## 2017-11-15 MED ORDER — CARVEDILOL 3.125 MG PO TABS: 1.5625 mg | ORAL_TABLET | Freq: Two times a day (BID) | ORAL | 3 refills | Status: DC

## 2017-11-21 DIAGNOSIS — E298 Other testicular dysfunction: Secondary | ICD-10-CM | POA: Diagnosis not present

## 2017-12-02 ENCOUNTER — Other Ambulatory Visit (HOSPITAL_COMMUNITY): Payer: Self-pay | Admitting: *Deleted

## 2017-12-02 MED ORDER — EZETIMIBE 10 MG PO TABS: 10.0000 mg | ORAL_TABLET | Freq: Every day | ORAL | 3 refills | Status: DC

## 2017-12-19 DIAGNOSIS — E291 Testicular hypofunction: Secondary | ICD-10-CM | POA: Diagnosis not present

## 2018-01-09 DIAGNOSIS — E291 Testicular hypofunction: Secondary | ICD-10-CM | POA: Diagnosis not present

## 2018-01-13 DIAGNOSIS — E2740 Unspecified adrenocortical insufficiency: Secondary | ICD-10-CM | POA: Diagnosis not present

## 2018-01-13 DIAGNOSIS — Z6831 Body mass index (BMI) 31.0-31.9, adult: Secondary | ICD-10-CM | POA: Diagnosis not present

## 2018-01-13 DIAGNOSIS — E291 Testicular hypofunction: Secondary | ICD-10-CM | POA: Diagnosis not present

## 2018-01-13 DIAGNOSIS — E2749 Other adrenocortical insufficiency: Secondary | ICD-10-CM | POA: Diagnosis not present

## 2018-01-13 DIAGNOSIS — E230 Hypopituitarism: Secondary | ICD-10-CM | POA: Diagnosis not present

## 2018-01-13 DIAGNOSIS — R7301 Impaired fasting glucose: Secondary | ICD-10-CM | POA: Diagnosis not present

## 2018-01-13 DIAGNOSIS — I255 Ischemic cardiomyopathy: Secondary | ICD-10-CM | POA: Diagnosis not present

## 2018-01-30 DIAGNOSIS — W57XXXA Bitten or stung by nonvenomous insect and other nonvenomous arthropods, initial encounter: Secondary | ICD-10-CM | POA: Diagnosis not present

## 2018-01-30 DIAGNOSIS — E291 Testicular hypofunction: Secondary | ICD-10-CM | POA: Diagnosis not present

## 2018-01-30 DIAGNOSIS — I10 Essential (primary) hypertension: Secondary | ICD-10-CM | POA: Diagnosis not present

## 2018-01-30 DIAGNOSIS — Z6832 Body mass index (BMI) 32.0-32.9, adult: Secondary | ICD-10-CM | POA: Diagnosis not present

## 2018-02-19 DIAGNOSIS — E291 Testicular hypofunction: Secondary | ICD-10-CM | POA: Diagnosis not present

## 2018-02-28 ENCOUNTER — Telehealth (HOSPITAL_COMMUNITY): Payer: Self-pay

## 2018-02-28 ENCOUNTER — Encounter: Payer: Self-pay | Admitting: Cardiology

## 2018-02-28 DIAGNOSIS — R0600 Dyspnea, unspecified: Secondary | ICD-10-CM | POA: Diagnosis not present

## 2018-02-28 DIAGNOSIS — I2510 Atherosclerotic heart disease of native coronary artery without angina pectoris: Secondary | ICD-10-CM | POA: Diagnosis not present

## 2018-02-28 DIAGNOSIS — I10 Essential (primary) hypertension: Secondary | ICD-10-CM | POA: Diagnosis not present

## 2018-02-28 DIAGNOSIS — R0609 Other forms of dyspnea: Secondary | ICD-10-CM | POA: Diagnosis not present

## 2018-02-28 DIAGNOSIS — R05 Cough: Secondary | ICD-10-CM | POA: Diagnosis not present

## 2018-02-28 DIAGNOSIS — Z6832 Body mass index (BMI) 32.0-32.9, adult: Secondary | ICD-10-CM | POA: Diagnosis not present

## 2018-02-28 NOTE — Telephone Encounter (Signed)
Dr. Silvestre Mesi office calling concerned about patient in their office currently.  Stating much more SOB than normal. Has not been seen in CHF clinic since 07/2017. Scheduled to see Dr. Aundra Dubin on 6/10. Will forward to Dr. Aundra Dubin to further advise as patient does not seem to be on any diuretics, though unclear what SOB etiology is with lack of weight or assessment from clinic RN. Patient to go to ED for further issues over the weekend or call us next week if needed.  Renee Pain, RN

## 2018-02-28 NOTE — Telephone Encounter (Signed)
Maybe bring in for nursing visit to get ECG and vitals and set up for echo next week, then I will see on 6/10 unless he needs to be seen sooner.

## 2018-03-03 DIAGNOSIS — J3081 Allergic rhinitis due to animal (cat) (dog) hair and dander: Secondary | ICD-10-CM | POA: Diagnosis not present

## 2018-03-03 DIAGNOSIS — J301 Allergic rhinitis due to pollen: Secondary | ICD-10-CM | POA: Diagnosis not present

## 2018-03-03 DIAGNOSIS — J3089 Other allergic rhinitis: Secondary | ICD-10-CM | POA: Diagnosis not present

## 2018-03-05 DIAGNOSIS — R350 Frequency of micturition: Secondary | ICD-10-CM | POA: Diagnosis not present

## 2018-03-05 DIAGNOSIS — R972 Elevated prostate specific antigen [PSA]: Secondary | ICD-10-CM | POA: Diagnosis not present

## 2018-03-05 DIAGNOSIS — N401 Enlarged prostate with lower urinary tract symptoms: Secondary | ICD-10-CM | POA: Diagnosis not present

## 2018-03-06 DIAGNOSIS — E291 Testicular hypofunction: Secondary | ICD-10-CM | POA: Diagnosis not present

## 2018-03-07 NOTE — Telephone Encounter (Signed)
Pt states he feels stable. Understands that if symptoms increase or worsen in any way to go to ED. He said he will see [place] at his appointment 6/10.

## 2018-03-10 ENCOUNTER — Ambulatory Visit (HOSPITAL_COMMUNITY)
Admission: RE | Admit: 2018-03-10 | Discharge: 2018-03-10 | Disposition: A | Discharge status: Home / Self Care | Payer: Medicare Other | Source: Ambulatory Visit | Attending: Cardiology | Admitting: Cardiology

## 2018-03-10 ENCOUNTER — Other Ambulatory Visit: Payer: Self-pay

## 2018-03-10 VITALS — BP 156/92 | HR 53 | Wt 253.4 lb

## 2018-03-10 DIAGNOSIS — I429 Cardiomyopathy, unspecified: Secondary | ICD-10-CM

## 2018-03-10 DIAGNOSIS — Z79899 Other long term (current) drug therapy: Secondary | ICD-10-CM | POA: Diagnosis not present

## 2018-03-10 DIAGNOSIS — E785 Hyperlipidemia, unspecified: Secondary | ICD-10-CM | POA: Insufficient documentation

## 2018-03-10 DIAGNOSIS — R0609 Other forms of dyspnea: Secondary | ICD-10-CM | POA: Diagnosis not present

## 2018-03-10 DIAGNOSIS — I2582 Chronic total occlusion of coronary artery: Secondary | ICD-10-CM | POA: Diagnosis not present

## 2018-03-10 DIAGNOSIS — Z09 Encounter for follow-up examination after completed treatment for conditions other than malignant neoplasm: Secondary | ICD-10-CM | POA: Insufficient documentation

## 2018-03-10 DIAGNOSIS — Z8249 Family history of ischemic heart disease and other diseases of the circulatory system: Secondary | ICD-10-CM | POA: Insufficient documentation

## 2018-03-10 DIAGNOSIS — Z9889 Other specified postprocedural states: Secondary | ICD-10-CM | POA: Insufficient documentation

## 2018-03-10 DIAGNOSIS — E7800 Pure hypercholesterolemia, unspecified: Secondary | ICD-10-CM | POA: Diagnosis not present

## 2018-03-10 DIAGNOSIS — I6521 Occlusion and stenosis of right carotid artery: Secondary | ICD-10-CM | POA: Insufficient documentation

## 2018-03-10 DIAGNOSIS — R079 Chest pain, unspecified: Secondary | ICD-10-CM

## 2018-03-10 DIAGNOSIS — G3184 Mild cognitive impairment, so stated: Secondary | ICD-10-CM | POA: Diagnosis not present

## 2018-03-10 DIAGNOSIS — E039 Hypothyroidism, unspecified: Secondary | ICD-10-CM | POA: Diagnosis not present

## 2018-03-10 DIAGNOSIS — I2510 Atherosclerotic heart disease of native coronary artery without angina pectoris: Secondary | ICD-10-CM

## 2018-03-10 DIAGNOSIS — Z7982 Long term (current) use of aspirin: Secondary | ICD-10-CM | POA: Diagnosis not present

## 2018-03-10 DIAGNOSIS — E8770 Fluid overload, unspecified: Secondary | ICD-10-CM | POA: Diagnosis not present

## 2018-03-10 DIAGNOSIS — I252 Old myocardial infarction: Secondary | ICD-10-CM | POA: Diagnosis not present

## 2018-03-10 LAB — BASIC METABOLIC PANEL
Anion gap: 8 (ref 5–15)
BUN: 17 mg/dL (ref 6–20)
CHLORIDE: 107 mmol/L (ref 101–111)
CO2: 28 mmol/L (ref 22–32)
Calcium: 9.4 mg/dL (ref 8.9–10.3)
Creatinine, Ser: 1.23 mg/dL (ref 0.61–1.24)
GFR calc Af Amer: >60 mL/min (ref >60)
GFR calc non Af Amer: 53 mL/min — ABNORMAL LOW (ref >60)
GLUCOSE: 90 mg/dL (ref 65–99)
POTASSIUM: 4 mmol/L (ref 3.5–5.1)
Sodium: 143 mmol/L (ref 135–145)

## 2018-03-10 LAB — BRAIN NATRIURETIC PEPTIDE: B Natriuretic Peptide: 128 pg/mL — ABNORMAL HIGH (ref 0.0–100.0)

## 2018-03-10 MED ORDER — POTASSIUM CHLORIDE ER 10 MEQ PO TBCR: 20.0000 meq | EXTENDED_RELEASE_TABLET | Freq: Every day | ORAL | 3 refills | Status: DC

## 2018-03-10 MED ORDER — FUROSEMIDE 20 MG PO TABS: 20.0000 mg | ORAL_TABLET | Freq: Every day | ORAL | 2 refills | Status: DC

## 2018-03-10 MED ORDER — NITROGLYCERIN 0.4 MG SL SUBL: 0.4000 mg | SUBLINGUAL_TABLET | SUBLINGUAL | 3 refills | Status: DC | PRN

## 2018-03-10 NOTE — Patient Instructions (Addendum)
Start Furosemide 20 mg (1 tabs) daily for daily  Start Potassium 10 meq (1 tabs) daily   Your physician has requested that you have a stress echocardiogram. For further information please visit [email or web address]. Please follow instruction sheet as given.   Your physician has requested that you have an echocardiogram. Echocardiography is a painless test that uses sound waves to create images of your heart. It provides your doctor with information about the size and shape of your heart and how well your heart's chambers and valves are working. This procedure takes approximately one hour. There are no restrictions for this procedure.   Labs drawn today (if we do not call you, then your lab work was stable)   Your physician recommends that you schedule a follow-up appointment in: 1 week with Dr. Aundra Dubin

## 2018-03-10 NOTE — Progress Notes (Signed)
ReDS Vest - 03/10/18 1000      ReDS Vest   MR   No    Fitting Posture  Standing    Height Marker  Tall    Ruler Value  9.5    Center Strip  Shifted    ReDS Value  30

## 2018-03-10 NOTE — H&P (View-Only) (Signed)
Patient ID: [name], [name], male   DOB: Sep 22, 1934, 84 y.o.   MRN: [id number]     Advanced Heart Failure Clinic Note   PCP: Dr. 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  Patient has a long history of atrial bigeminy.  He is in this today.  He had had a nonischemic cardiomyopathy with EF around 40% but most recent echo showed normal EF. Historically, he has not been able to tolerate statins, and he has been tried on multiple agents. He is now on Repatha.   In 11/15, he developed chest tightness and was admitted to [facility] with NSTEMI.  LHC showed 60-70% proximal LAD stenosis with totally occluded LCx.  This was opened with DES to the proximal LCx.  Echo post-cath showed EF 55-60%.  In 10/16, he developed typical angina.  LHC showed 95% proximal LAD stenosis that was treated with DES.    He can only tolerate Coreg 3.125 mg 1/2 tab twice a day.  He has had difficulty with BP-active meds in the setting of adrenal insufficiency post pituitary surgery.   Admitted 11/2016 for syncope. Thought to be in setting of adrenal insufficiency complicated by viral gastroenteritis.   He returns for followup of CAD and cardiomyopathy.  He has developed dyspnea over the last month.  He notes shortness of breath walking across his house and walking to mailbox, also when he does yardwork. This is new.  He has bendopnea and also has symptoms consistent with PND.  He has not had chest pain recently, but about a month ago he had about 5 days where he would get epigastric burning after breakfast.  He attributed this to coffee and stopped drinking it.  The morning epigastric pain then resolved.  The pain was not exertional.  Poor energy generally. Weight is up 3 lbs.    REDS vest measurement: 30%  Labs (5/12): K 4.3, creatinine 1.03 Labs (2/15): K 4,  creatinine 1.1, LDL 184 Labs (11/15): K 4.8, creatinine 1.07 Labs (3/16): K 4.4, creatinine 0.9 Labs (4/16): LDL 80, HDL 37 (on Repatha) Labs (5/16): HCT 42.7 Labs (10/16): creatinine 1.05 Labs (11/16): LDL 77, LDL-P 1283 Labs (6/18): LDL 119, HDL 42, K 4.1, creatinine 1.2 Labs (12/18): LDL 70, HDL 45  ECG (personally reviewed): sinus brady at 49, o/w normal.   PMH: 1. Hyperlipidemia: He has been unable to tolerate statins.  2. Chronic atrial bigeminy: x years.  3. Hypothyroidism: s/p surgery for pituitary adenoma.  4. Pituitary adenoma: s/p surgery in 3/11 for removal.  Now on thyroid replacement and hydrocortisone.  5. Cardiomyopathy: Nonischemic.  LHC (2009) with 50% mid LAD, 60% distal LAD.  Left ventriculogram (2009) with EF 40-45%.  Echo (8/13) with EF 35-40%, mild LVH.  Echo (11/14) with EF 40%, diffuse hypokinesis, normal RV.  He was tried in the past on an ACEI but apparently at that time began to have side effects from his pituitary adenoma that were thought initially to be ACEI-related.   He has been off ACEI since that time.  He has had trouble tolerating more that 1/2 tab of 3.125 mg Coreg once a day. Echo (11/15) with EF 55-60%, aortic sclerosis without significant stenosis.  - Echo (3/18): EF 60-65%, no significant valvular abnormalities.  6. CAD: 2009 LHC with 50% mid, 60% distal LAD. NSTEMI 11/15 with 60-70%  pLAD, totally occluded LCx with DES to proximal LCx.  EF 55-60% by LV-gram.  Angina with LHC 10/16 showing 95% pLAD, 40% mLAD, 40% dLAD, 70% AM2 => treated with DES to pLAD.  7. Mild cognitive impairment.  8. Carotid stenosis: Carotid dopplers (1/16) with 40-59% RICA stenosis.  - Carotid dopplers (1/17): Mild plaque only.   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   FH: Brother with CAD.  Review of systems complete and found to be negative unless listed in HPI.    Current Outpatient Medications  Medication Sig Dispense Refill  .  alfuzosin (UROXATRAL) 10 MG 24 hr tablet Take 1 tablet (10 mg total) by mouth daily with breakfast. 30 tablet 2  . ALPRAZolam (XANAX) 0.5 MG tablet Take 0.5 mg by mouth as needed for anxiety.     Marland Kitchen aspirin 81 MG tablet Take 81 mg by mouth daily.      . carvedilol (COREG) 3.125 MG tablet Take 0.5 tablets (1.5625 mg total) by mouth 2 (two) times daily. 45 tablet 3  . Coenzyme Q10 (COQ10) 200 MG CAPS Take 1 capsule by mouth daily.      Marland Kitchen EPIPEN 2-PAK 0.3 MG/0.3ML SOAJ injection Inject 1 Dose as directed as needed.  0  . ergocalciferol (VITAMIN D2) 50000 UNITS capsule Take 50,000 Units by mouth 2 (two) times a week.     . Evolocumab (REPATHA) 140 MG/ML SOSY Inject into the skin every 14 (fourteen) days.    Marland Kitchen ezetimibe (ZETIA) 10 MG tablet Take 1 tablet (10 mg total) by mouth daily. 30 tablet 3  . finasteride (PROSCAR) 5 MG tablet Take 5 mg by mouth daily.      . hydrocortisone (CORTEF) 10 MG tablet Take 20 mg by mouth 2 (two) times daily.    Marland Kitchen levothyroxine (SYNTHROID, LEVOTHROID) 150 MCG tablet Take 150 mcg by mouth daily before breakfast.     . MYRBETRIQ 50 MG TB24 tablet Take 50 mg by mouth daily.  0  . nitroGLYCERIN (NITROSTAT) 0.4 MG SL tablet Place 1 tablet (0.4 mg total) under the tongue every 5 (five) minutes as needed for chest pain. 90 tablet 3  . Omega-3 1000 MG CAPS Take 1 capsule (1,000 mg total) by mouth 2 (two) times daily. 60 capsule 11  . omeprazole (PRILOSEC) 40 MG capsule Take 40 mg by mouth daily.  1  . Propylene Glycol (SYSTANE BALANCE OP) Apply 2 drops to eye daily as needed (dry eye).    . Red Yeast Rice Extract 600 MG CAPS Take 600 mg by mouth daily.    . tadalafil (CIALIS) 5 MG tablet Take 1 tablet (5 mg total) by mouth daily as needed for erectile dysfunction. Do not take nitro within 48 hrs after taking cialis 10 tablet 1  . testosterone cypionate (DEPOTESTOTERONE CYPIONATE) 200 MG/ML injection Inject into the muscle every 21 ( twenty-one) days. 1.62 % injection    .  ticagrelor (BRILINTA) 60 MG TABS tablet Take 1 tablet (60 mg total) by mouth 2 (two) times daily. Needs an office visit 180 tablet 3  . furosemide (LASIX) 20 MG tablet Take 1 tablet (20 mg total) by mouth daily. And as prescribed by MD 35 tablet 2  . potassium chloride (K-DUR) 10 MEQ tablet Take 2 tablets (20 mEq total) by mouth daily. And as prescribe by MD 65 tablet 3   No current facility-administered medications for this encounter.     BP (!) 156/92   Pulse (!) 53  Wt 253 lb 6.4 oz (114.9 kg)   SpO2 97%   BMI 31.67 kg/m   General: NAD Neck: JVP 8-9 cm, no thyromegaly or thyroid nodule.  Lungs: Clear to auscultation bilaterally with normal respiratory effort. CV: Nondisplaced PMI.  Heart regular S1/S2, no S3/S4, no murmur.  2+ edema 1/2 up lower legs bilaterally.  No carotid bruit.  Normal pedal pulses.  Abdomen: Soft, nontender, no hepatosplenomegaly, no distention.  Skin: Intact without lesions or rashes.  Neurologic: Alert and oriented x 3.  Psych: Normal affect. Extremities: No clubbing or cyanosis.  HEENT: Normal.   Assessment/Plan: 1. Cardiomyopathy: EF 40% with diffuse hypokinesis on 11/14 echo, but most recent echo showed EF up to 60-65% (3/18).  He does not feel as well recently, he has symptoms that are concerning for CHF.  He is mildly volume overloaded on exam though REDS vest reading is not elevated.  - Continue Coreg at 3.125 1/2 tab bid, he cannot tolerate a higher does due to orthostasis.   - BNP today.  - Echo.  - Start Lasix 20 mg daily with KCl 10 mEq daily.    2. CAD: s/p NSTEMI with DES to LCx then angina with DES to pLAD.  He has not tolerated multiple statins but is now on Repatha.  No chest pain in the last couple of weeks, but he had episodes of epigastric burning about a month ago, starting around the time his exertional dyspnea began.  I am concerned that he may have suffered another coronary event and now has resultant CHF . - Will continue ASA 81 and  Brilinta 60 mg BID. - Continue current Coreg.  - Continue Repatha.  - I will arrange for ETT-Cardiolite to look for ischemia.  3. Hyperlipidemia: He has not tolerated multiple statins. Has previously failed Crestor 5 mg two times/week due to myalgias.   - Continue Repatha, good lipids on this med.  4. Carotid bruit: 10/2016 dopplers with minimal stenosis. Unchanged from previous.  5. Atrial bigeminy:  Chronic in past but no PACs noted on today's ECG.  - He does not tolerate higher doses of beta blockers.   He will followup next week for reassessment.  [name] 03/10/2018

## 2018-03-10 NOTE — Progress Notes (Signed)
Patient ID: [name], [name], male   DOB: 1933-10-24, 84 y.o.   MRN: [id number]     Advanced Heart Failure Clinic Note   PCP: Dr. 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  Patient has a long history of atrial bigeminy.  He is in this today.  He had had a nonischemic cardiomyopathy with EF around 40% but most recent echo showed normal EF. Historically, he has not been able to tolerate statins, and he has been tried on multiple agents. He is now on Repatha.   In 11/15, he developed chest tightness and was admitted to [facility] with NSTEMI.  LHC showed 60-70% proximal LAD stenosis with totally occluded LCx.  This was opened with DES to the proximal LCx.  Echo post-cath showed EF 55-60%.  In 10/16, he developed typical angina.  LHC showed 95% proximal LAD stenosis that was treated with DES.    He can only tolerate Coreg 3.125 mg 1/2 tab twice a day.  He has had difficulty with BP-active meds in the setting of adrenal insufficiency post pituitary surgery.   Admitted 11/2016 for syncope. Thought to be in setting of adrenal insufficiency complicated by viral gastroenteritis.   He returns for followup of CAD and cardiomyopathy.  He has developed dyspnea over the last month.  He notes shortness of breath walking across his house and walking to mailbox, also when he does yardwork. This is new.  He has bendopnea and also has symptoms consistent with PND.  He has not had chest pain recently, but about a month ago he had about 5 days where he would get epigastric burning after breakfast.  He attributed this to coffee and stopped drinking it.  The morning epigastric pain then resolved.  The pain was not exertional.  Poor energy generally. Weight is up 3 lbs.    REDS vest measurement: 30%  Labs (5/12): K 4.3, creatinine 1.03 Labs (2/15): K 4,  creatinine 1.1, LDL 184 Labs (11/15): K 4.8, creatinine 1.07 Labs (3/16): K 4.4, creatinine 0.9 Labs (4/16): LDL 80, HDL 37 (on Repatha) Labs (5/16): HCT 42.7 Labs (10/16): creatinine 1.05 Labs (11/16): LDL 77, LDL-P 1283 Labs (6/18): LDL 119, HDL 42, K 4.1, creatinine 1.2 Labs (12/18): LDL 70, HDL 45  ECG (personally reviewed): sinus brady at 49, o/w normal.   PMH: 1. Hyperlipidemia: He has been unable to tolerate statins.  2. Chronic atrial bigeminy: x years.  3. Hypothyroidism: s/p surgery for pituitary adenoma.  4. Pituitary adenoma: s/p surgery in 3/11 for removal.  Now on thyroid replacement and hydrocortisone.  5. Cardiomyopathy: Nonischemic.  LHC (2009) with 50% mid LAD, 60% distal LAD.  Left ventriculogram (2009) with EF 40-45%.  Echo (8/13) with EF 35-40%, mild LVH.  Echo (11/14) with EF 40%, diffuse hypokinesis, normal RV.  He was tried in the past on an ACEI but apparently at that time began to have side effects from his pituitary adenoma that were thought initially to be ACEI-related.   He has been off ACEI since that time.  He has had trouble tolerating more that 1/2 tab of 3.125 mg Coreg once a day. Echo (11/15) with EF 55-60%, aortic sclerosis without significant stenosis.  - Echo (3/18): EF 60-65%, no significant valvular abnormalities.  6. CAD: 2009 LHC with 50% mid, 60% distal LAD. NSTEMI 11/15 with 60-70%  pLAD, totally occluded LCx with DES to proximal LCx.  EF 55-60% by LV-gram.  Angina with LHC 10/16 showing 95% pLAD, 40% mLAD, 40% dLAD, 70% AM2 => treated with DES to pLAD.  7. Mild cognitive impairment.  8. Carotid stenosis: Carotid dopplers (1/16) with 40-59% RICA stenosis.  - Carotid dopplers (1/17): Mild plaque only.   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   FH: Brother with CAD.  Review of systems complete and found to be negative unless listed in HPI.    Current Outpatient Medications  Medication Sig Dispense Refill  .  alfuzosin (UROXATRAL) 10 MG 24 hr tablet Take 1 tablet (10 mg total) by mouth daily with breakfast. 30 tablet 2  . ALPRAZolam (XANAX) 0.5 MG tablet Take 0.5 mg by mouth as needed for anxiety.     Marland Kitchen aspirin 81 MG tablet Take 81 mg by mouth daily.      . carvedilol (COREG) 3.125 MG tablet Take 0.5 tablets (1.5625 mg total) by mouth 2 (two) times daily. 45 tablet 3  . Coenzyme Q10 (COQ10) 200 MG CAPS Take 1 capsule by mouth daily.      Marland Kitchen EPIPEN 2-PAK 0.3 MG/0.3ML SOAJ injection Inject 1 Dose as directed as needed.  0  . ergocalciferol (VITAMIN D2) 50000 UNITS capsule Take 50,000 Units by mouth 2 (two) times a week.     . Evolocumab (REPATHA) 140 MG/ML SOSY Inject into the skin every 14 (fourteen) days.    Marland Kitchen ezetimibe (ZETIA) 10 MG tablet Take 1 tablet (10 mg total) by mouth daily. 30 tablet 3  . finasteride (PROSCAR) 5 MG tablet Take 5 mg by mouth daily.      . hydrocortisone (CORTEF) 10 MG tablet Take 20 mg by mouth 2 (two) times daily.    Marland Kitchen levothyroxine (SYNTHROID, LEVOTHROID) 150 MCG tablet Take 150 mcg by mouth daily before breakfast.     . MYRBETRIQ 50 MG TB24 tablet Take 50 mg by mouth daily.  0  . nitroGLYCERIN (NITROSTAT) 0.4 MG SL tablet Place 1 tablet (0.4 mg total) under the tongue every 5 (five) minutes as needed for chest pain. 90 tablet 3  . Omega-3 1000 MG CAPS Take 1 capsule (1,000 mg total) by mouth 2 (two) times daily. 60 capsule 11  . omeprazole (PRILOSEC) 40 MG capsule Take 40 mg by mouth daily.  1  . Propylene Glycol (SYSTANE BALANCE OP) Apply 2 drops to eye daily as needed (dry eye).    . Red Yeast Rice Extract 600 MG CAPS Take 600 mg by mouth daily.    . tadalafil (CIALIS) 5 MG tablet Take 1 tablet (5 mg total) by mouth daily as needed for erectile dysfunction. Do not take nitro within 48 hrs after taking cialis 10 tablet 1  . testosterone cypionate (DEPOTESTOTERONE CYPIONATE) 200 MG/ML injection Inject into the muscle every 21 ( twenty-one) days. 1.62 % injection    .  ticagrelor (BRILINTA) 60 MG TABS tablet Take 1 tablet (60 mg total) by mouth 2 (two) times daily. Needs an office visit 180 tablet 3  . furosemide (LASIX) 20 MG tablet Take 1 tablet (20 mg total) by mouth daily. And as prescribed by MD 35 tablet 2  . potassium chloride (K-DUR) 10 MEQ tablet Take 2 tablets (20 mEq total) by mouth daily. And as prescribe by MD 65 tablet 3   No current facility-administered medications for this encounter.     BP (!) 156/92   Pulse (!) 53  Wt 253 lb 6.4 oz (114.9 kg)   SpO2 97%   BMI 31.67 kg/m   General: NAD Neck: JVP 8-9 cm, no thyromegaly or thyroid nodule.  Lungs: Clear to auscultation bilaterally with normal respiratory effort. CV: Nondisplaced PMI.  Heart regular S1/S2, no S3/S4, no murmur.  2+ edema 1/2 up lower legs bilaterally.  No carotid bruit.  Normal pedal pulses.  Abdomen: Soft, nontender, no hepatosplenomegaly, no distention.  Skin: Intact without lesions or rashes.  Neurologic: Alert and oriented x 3.  Psych: Normal affect. Extremities: No clubbing or cyanosis.  HEENT: Normal.   Assessment/Plan: 1. Cardiomyopathy: EF 40% with diffuse hypokinesis on 11/14 echo, but most recent echo showed EF up to 60-65% (3/18).  He does not feel as well recently, he has symptoms that are concerning for CHF.  He is mildly volume overloaded on exam though REDS vest reading is not elevated.  - Continue Coreg at 3.125 1/2 tab bid, he cannot tolerate a higher does due to orthostasis.   - BNP today.  - Echo.  - Start Lasix 20 mg daily with KCl 10 mEq daily.    2. CAD: s/p NSTEMI with DES to LCx then angina with DES to pLAD.  He has not tolerated multiple statins but is now on Repatha.  No chest pain in the last couple of weeks, but he had episodes of epigastric burning about a month ago, starting around the time his exertional dyspnea began.  I am concerned that he may have suffered another coronary event and now has resultant CHF . - Will continue ASA 81 and  Brilinta 60 mg BID. - Continue current Coreg.  - Continue Repatha.  - I will arrange for ETT-Cardiolite to look for ischemia.  3. Hyperlipidemia: He has not tolerated multiple statins. Has previously failed Crestor 5 mg two times/week due to myalgias.   - Continue Repatha, good lipids on this med.  4. Carotid bruit: 10/2016 dopplers with minimal stenosis. Unchanged from previous.  5. Atrial bigeminy:  Chronic in past but no PACs noted on today's ECG.  - He does not tolerate higher doses of beta blockers.   He will followup next week for reassessment.  [name] 03/10/2018

## 2018-03-11 ENCOUNTER — Telehealth (HOSPITAL_COMMUNITY): Payer: Self-pay | Admitting: *Deleted

## 2018-03-11 ENCOUNTER — Other Ambulatory Visit (HOSPITAL_COMMUNITY): Payer: Self-pay | Admitting: *Deleted

## 2018-03-11 MED ORDER — POTASSIUM CHLORIDE ER 10 MEQ PO TBCR: 10.0000 meq | EXTENDED_RELEASE_TABLET | Freq: Every day | ORAL | 3 refills | Status: DC

## 2018-03-11 NOTE — Telephone Encounter (Signed)
Patient's wife called to clarify patient's potassium prescription.  Prescription sent to pharmacy for him to take 20 mEq daily but they were instructed to take 10 mEq daily.  Correct prescription sent to pharmacy.

## 2018-03-13 ENCOUNTER — Telehealth (HOSPITAL_COMMUNITY): Payer: Self-pay | Admitting: *Deleted

## 2018-03-13 NOTE — Telephone Encounter (Signed)
Left message on voicemail per DPR in reference to upcoming appointment scheduled on 03/17/18 with detailed instructions given per Myocardial Perfusion Study Information Sheet for the test. LM to arrive 15 minutes early, and that it is imperative to arrive on time for appointment to keep from having the test rescheduled. If you need to cancel or reschedule your appointment, please call the office within 24 hours of your appointment. Failure to do so may result in a cancellation of your appointment, and a $50 no show fee. Phone number given for call back for any questions. Kirstie Peri

## 2018-03-17 ENCOUNTER — Other Ambulatory Visit: Payer: Self-pay

## 2018-03-17 ENCOUNTER — Other Ambulatory Visit (HOSPITAL_COMMUNITY): Payer: Self-pay | Admitting: Cardiology

## 2018-03-17 ENCOUNTER — Ambulatory Visit (HOSPITAL_BASED_OUTPATIENT_CLINIC_OR_DEPARTMENT_OTHER): Payer: Medicare Other

## 2018-03-17 ENCOUNTER — Other Ambulatory Visit: Payer: Self-pay | Admitting: Cardiology

## 2018-03-17 ENCOUNTER — Ambulatory Visit (HOSPITAL_COMMUNITY): Payer: Medicare Other | Attending: Cardiology

## 2018-03-17 DIAGNOSIS — R079 Chest pain, unspecified: Secondary | ICD-10-CM | POA: Diagnosis not present

## 2018-03-17 DIAGNOSIS — I2510 Atherosclerotic heart disease of native coronary artery without angina pectoris: Secondary | ICD-10-CM

## 2018-03-17 DIAGNOSIS — I429 Cardiomyopathy, unspecified: Secondary | ICD-10-CM | POA: Diagnosis not present

## 2018-03-17 DIAGNOSIS — R9439 Abnormal result of other cardiovascular function study: Secondary | ICD-10-CM | POA: Insufficient documentation

## 2018-03-17 DIAGNOSIS — E785 Hyperlipidemia, unspecified: Secondary | ICD-10-CM | POA: Insufficient documentation

## 2018-03-17 DIAGNOSIS — I252 Old myocardial infarction: Secondary | ICD-10-CM | POA: Insufficient documentation

## 2018-03-17 LAB — MYOCARDIAL PERFUSION IMAGING
CHL CUP RESTING HR STRESS: 58 {beats}/min
CSEPED: 5 min
CSEPEDS: 0 s
Estimated workload: 7 METS
LHR: 0.32
LV sys vol: 73 mL
LVDIAVOL: 136 mL (ref 62–150)
MPHR: 139 {beats}/min
Peak HR: 131 {beats}/min
Percent HR: 94 %
SDS: 3
SRS: 7
SSS: 10
TID: 0.9

## 2018-03-17 LAB — ECHOCARDIOGRAM COMPLETE
HEIGHTINCHES: 75 in
WEIGHTICAEL: 4048 [oz_av]

## 2018-03-17 MED ORDER — TECHNETIUM TC 99M TETROFOSMIN IV KIT
10.1000 | PACK | Freq: Once | INTRAVENOUS | Status: AC | PRN
  Administered 2018-03-17: 10.1 via INTRAVENOUS
  Filled 2018-03-17: qty 11

## 2018-03-17 MED ORDER — TECHNETIUM TC 99M TETROFOSMIN IV KIT
30.8000 | PACK | Freq: Once | INTRAVENOUS | Status: AC | PRN
Start: 2018-03-17 — End: 2018-03-17
  Administered 2018-03-17: 30.8 via INTRAVENOUS
  Filled 2018-03-17: qty 31

## 2018-03-18 ENCOUNTER — Other Ambulatory Visit (HOSPITAL_COMMUNITY): Payer: Self-pay

## 2018-03-18 DIAGNOSIS — Z85828 Personal history of other malignant neoplasm of skin: Secondary | ICD-10-CM | POA: Diagnosis not present

## 2018-03-18 DIAGNOSIS — R079 Chest pain, unspecified: Secondary | ICD-10-CM

## 2018-03-18 DIAGNOSIS — D1801 Hemangioma of skin and subcutaneous tissue: Secondary | ICD-10-CM | POA: Diagnosis not present

## 2018-03-18 DIAGNOSIS — L821 Other seborrheic keratosis: Secondary | ICD-10-CM | POA: Diagnosis not present

## 2018-03-18 DIAGNOSIS — Z85820 Personal history of malignant melanoma of skin: Secondary | ICD-10-CM | POA: Diagnosis not present

## 2018-03-18 DIAGNOSIS — L570 Actinic keratosis: Secondary | ICD-10-CM | POA: Diagnosis not present

## 2018-03-19 ENCOUNTER — Encounter (HOSPITAL_COMMUNITY): Payer: Medicare Other | Admitting: Cardiology

## 2018-03-19 ENCOUNTER — Ambulatory Visit (HOSPITAL_COMMUNITY)
Admission: RE | Admit: 2018-03-19 | Discharge: 2018-03-19 | Disposition: A | Discharge status: Home / Self Care | Payer: Medicare Other | Source: Ambulatory Visit | Attending: Cardiology | Admitting: Cardiology

## 2018-03-19 ENCOUNTER — Ambulatory Visit (HOSPITAL_COMMUNITY)
Admission: RE | Disposition: A | Discharge status: Home / Self Care | Payer: Self-pay | Source: Ambulatory Visit | Attending: Cardiology

## 2018-03-19 DIAGNOSIS — I2510 Atherosclerotic heart disease of native coronary artery without angina pectoris: Secondary | ICD-10-CM | POA: Insufficient documentation

## 2018-03-19 DIAGNOSIS — I252 Old myocardial infarction: Secondary | ICD-10-CM | POA: Diagnosis not present

## 2018-03-19 DIAGNOSIS — Z955 Presence of coronary angioplasty implant and graft: Secondary | ICD-10-CM | POA: Insufficient documentation

## 2018-03-19 DIAGNOSIS — E785 Hyperlipidemia, unspecified: Secondary | ICD-10-CM | POA: Insufficient documentation

## 2018-03-19 DIAGNOSIS — I6521 Occlusion and stenosis of right carotid artery: Secondary | ICD-10-CM | POA: Insufficient documentation

## 2018-03-19 DIAGNOSIS — G3184 Mild cognitive impairment, so stated: Secondary | ICD-10-CM | POA: Diagnosis not present

## 2018-03-19 DIAGNOSIS — E039 Hypothyroidism, unspecified: Secondary | ICD-10-CM | POA: Diagnosis not present

## 2018-03-19 DIAGNOSIS — Z7982 Long term (current) use of aspirin: Secondary | ICD-10-CM | POA: Diagnosis not present

## 2018-03-19 DIAGNOSIS — I428 Other cardiomyopathies: Secondary | ICD-10-CM | POA: Insufficient documentation

## 2018-03-19 DIAGNOSIS — R0600 Dyspnea, unspecified: Secondary | ICD-10-CM | POA: Diagnosis not present

## 2018-03-19 DIAGNOSIS — Z7902 Long term (current) use of antithrombotics/antiplatelets: Secondary | ICD-10-CM | POA: Diagnosis not present

## 2018-03-19 DIAGNOSIS — R079 Chest pain, unspecified: Secondary | ICD-10-CM

## 2018-03-19 HISTORY — PX: RIGHT/LEFT HEART CATH AND CORONARY ANGIOGRAPHY: CATH118266

## 2018-03-19 LAB — CBC
HCT: 45.1 % (ref 39.0–52.0)
Hemoglobin: 14.8 g/dL (ref 13.0–17.0)
MCH: 30.3 pg (ref 26.0–34.0)
MCHC: 32.8 g/dL (ref 30.0–36.0)
MCV: 92.2 fL (ref 78.0–100.0)
PLATELETS: 213 10*3/uL (ref 150–400)
RBC: 4.89 MIL/uL (ref 4.22–5.81)
RDW: 15.2 % (ref 11.5–15.5)
WBC: 8.2 10*3/uL (ref 4.0–10.5)

## 2018-03-19 LAB — POCT I-STAT 3, VENOUS BLOOD GAS (G3P V)
Acid-Base Excess: 1 mmol/L (ref 0.0–2.0)
BICARBONATE: 25 mmol/L (ref 20.0–28.0)
Bicarbonate: 25.4 mmol/L (ref 20.0–28.0)
O2 Saturation: 70 %
O2 Saturation: 71 %
PCO2 VEN: 39.4 mmHg — AB (ref 44.0–60.0)
PCO2 VEN: 39.9 mmHg — AB (ref 44.0–60.0)
PH VEN: 7.406 (ref 7.250–7.430)
TCO2: 27 mmol/L (ref 22–32)
TCO2: 26 mmol/L (ref 22–32)
pH, Ven: 7.416 (ref 7.250–7.430)
pO2, Ven: 36 mmHg (ref 32.0–45.0)
pO2, Ven: 37 mmHg (ref 32.0–45.0)

## 2018-03-19 SURGERY — RIGHT/LEFT HEART CATH AND CORONARY ANGIOGRAPHY
Anesthesia: LOCAL

## 2018-03-19 MED ORDER — HEPARIN SODIUM (PORCINE) 1000 UNIT/ML IJ SOLN
INTRAMUSCULAR | Status: DC | PRN
  Administered 2018-03-19: 5000 [IU] via INTRAVENOUS

## 2018-03-19 MED ORDER — SODIUM CHLORIDE 0.9% FLUSH: 3.0000 mL | INTRAVENOUS | Status: DC | PRN

## 2018-03-19 MED ORDER — VERAPAMIL HCL 2.5 MG/ML IV SOLN
INTRAVENOUS | Status: AC
  Filled 2018-03-19: qty 2

## 2018-03-19 MED ORDER — IOHEXOL 350 MG/ML SOLN
INTRAVENOUS | Status: DC | PRN
  Administered 2018-03-19: 55 mL via INTRA_ARTERIAL

## 2018-03-19 MED ORDER — MIDAZOLAM HCL 2 MG/2ML IJ SOLN
INTRAMUSCULAR | Status: DC | PRN
  Administered 2018-03-19 (×2): 1 mg via INTRAVENOUS

## 2018-03-19 MED ORDER — FENTANYL CITRATE (PF) 100 MCG/2ML IJ SOLN
INTRAMUSCULAR | Status: DC | PRN
  Administered 2018-03-19 (×3): 25 ug via INTRAVENOUS

## 2018-03-19 MED ORDER — HEPARIN (PORCINE) IN NACL 2-0.9 UNITS/ML
INTRAMUSCULAR | Status: AC | PRN
  Administered 2018-03-19: 1000 mL

## 2018-03-19 MED ORDER — HEPARIN SODIUM (PORCINE) 1000 UNIT/ML IJ SOLN
INTRAMUSCULAR | Status: AC
  Filled 2018-03-19: qty 1

## 2018-03-19 MED ORDER — SODIUM CHLORIDE 0.9% FLUSH: 3.0000 mL | Freq: Two times a day (BID) | INTRAVENOUS | Status: DC

## 2018-03-19 MED ORDER — LIDOCAINE HCL (PF) 1 % IJ SOLN
INTRAMUSCULAR | Status: DC | PRN
  Administered 2018-03-19 (×2): 2 mL via SUBCUTANEOUS

## 2018-03-19 MED ORDER — ONDANSETRON HCL 4 MG/2ML IJ SOLN: 4.0000 mg | Freq: Four times a day (QID) | INTRAMUSCULAR | Status: DC | PRN

## 2018-03-19 MED ORDER — MIDAZOLAM HCL 2 MG/2ML IJ SOLN
INTRAMUSCULAR | Status: AC
  Filled 2018-03-19: qty 2

## 2018-03-19 MED ORDER — ASPIRIN 81 MG PO CHEW: 81.0000 mg | CHEWABLE_TABLET | ORAL | Status: DC

## 2018-03-19 MED ORDER — SODIUM CHLORIDE 0.9 % IV SOLN: 250.0000 mL | INTRAVENOUS | Status: DC | PRN

## 2018-03-19 MED ORDER — SODIUM CHLORIDE 0.9 % IV SOLN
INTRAVENOUS | Status: DC
  Administered 2018-03-19: 08:00:00 via INTRAVENOUS

## 2018-03-19 MED ORDER — HEPARIN (PORCINE) IN NACL 1000-0.9 UT/500ML-% IV SOLN
INTRAVENOUS | Status: AC
  Filled 2018-03-19: qty 1000

## 2018-03-19 MED ORDER — SODIUM CHLORIDE 0.9 % IV SOLN: INTRAVENOUS | Status: AC

## 2018-03-19 MED ORDER — LIDOCAINE HCL (PF) 1 % IJ SOLN
INTRAMUSCULAR | Status: AC
  Filled 2018-03-19: qty 30

## 2018-03-19 MED ORDER — ACETAMINOPHEN 325 MG PO TABS: 650.0000 mg | ORAL_TABLET | ORAL | Status: DC | PRN

## 2018-03-19 MED ORDER — VERAPAMIL HCL 2.5 MG/ML IV SOLN
INTRAVENOUS | Status: DC | PRN
  Administered 2018-03-19: 10 mL via INTRA_ARTERIAL

## 2018-03-19 MED ORDER — FENTANYL CITRATE (PF) 100 MCG/2ML IJ SOLN
INTRAMUSCULAR | Status: AC
  Filled 2018-03-19: qty 2

## 2018-03-19 SURGICAL SUPPLY — 14 items
CATH 5FR JL3.5 JR4 ANG PIG MP (CATHETERS) ×1 IMPLANT
CATH BALLN WEDGE 5F 110CM (CATHETERS) ×1 IMPLANT
CATH INFINITI 5FR JL4 (CATHETERS) ×1 IMPLANT
COVER PRB 48X5XTLSCP FOLD TPE (BAG) IMPLANT
COVER PROBE 5X48 (BAG) ×2
DEVICE RAD COMP TR BAND LRG (VASCULAR PRODUCTS) ×1 IMPLANT
GLIDESHEATH SLEND SS 6F .021 (SHEATH) ×1 IMPLANT
GUIDEWIRE INQWIRE 1.5J.035X260 (WIRE) IMPLANT
INQWIRE 1.5J .035X260CM (WIRE) ×2
KIT HEART LEFT (KITS) ×2 IMPLANT
PACK CARDIAC CATHETERIZATION (CUSTOM PROCEDURE TRAY) ×2 IMPLANT
SHEATH GLIDE SLENDER 4/5FR (SHEATH) ×1 IMPLANT
TRANSDUCER W/STOPCOCK (MISCELLANEOUS) ×2 IMPLANT
TUBING CIL FLEX 10 FLL-RA (TUBING) ×2 IMPLANT

## 2018-03-19 NOTE — Interval H&P Note (Signed)
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 After consideration of risks, benefits and other options for treatment, the patient has consented to  Procedure(s): RIGHT/LEFT HEART CATH AND CORONARY ANGIOGRAPHY (N/A) as a surgical intervention .  The patient's history has been reviewed, patient examined, no change in status, stable for surgery.  I have reviewed the patient's chart and labs.  Questions were answered to the patient's satisfaction.     Kenyetta Fife [employer]

## 2018-03-19 NOTE — Discharge Instructions (Signed)
Drink plenty of fluids over next 48 hours and keep right wrist elevated at heart level for 24 hours ° °Radial Site Care °Refer to this sheet in the next few weeks. These instructions provide you with information about caring for yourself after your procedure. Your health care provider may also give you more specific instructions. Your treatment has been planned according to current medical practices, but problems sometimes occur. Call your health care provider if you have any problems or questions after your procedure. °What can I expect after the procedure? °After your procedure, it is typical to have the following: °· Bruising at the radial site that usually fades within 1-2 weeks. °· Blood collecting in the tissue (hematoma) that may be painful to the touch. It should usually decrease in size and tenderness within 1-2 weeks. ° °Follow these instructions at home: °· Take medicines only as directed by your health care provider. °· You may shower 24-48 hours after the procedure or as directed by your health care provider. Remove the bandage (dressing) and gently wash the site with plain soap and water. Pat the area dry with a clean towel. Do not rub the site, because this may cause bleeding. °· Do not take baths, swim, or use a hot tub until your health care provider approves. °· Check your insertion site every day for redness, swelling, or drainage. °· Do not apply powder or lotion to the site. °· Do not flex or bend the affected arm for 24 hours or as directed by your health care provider. °· Do not push or pull heavy objects with the affected arm for 24 hours or as directed by your health care provider. °· Do not lift over 10 lb (4.5 kg) for 5 days after your procedure or as directed by your health care provider. °· Ask your health care provider when it is okay to: °? Return to work or school. °? Resume usual physical activities or sports. °? Resume sexual activity. °· Do not drive home if you are discharged the  same day as the procedure. Have someone else drive you. °· You may drive 24 hours after the procedure unless otherwise instructed by your health care provider. °· Do not operate machinery or power tools for 24 hours after the procedure. °· If your procedure was done as an outpatient procedure, which means that you went home the same day as your procedure, a responsible adult should be with you for the first 24 hours after you arrive home. °· Keep all follow-up visits as directed by your health care provider. This is important. °Contact a health care provider if: °· You have a fever. °· You have chills. °· You have increased bleeding from the radial site. Hold pressure on the site. °Get help right away if: °· You have unusual pain at the radial site. °· You have redness, warmth, or swelling at the radial site. °· You have drainage (other than a small amount of blood on the dressing) from the radial site. °· The radial site is bleeding, and the bleeding does not stop after 30 minutes of holding steady pressure on the site. °· Your arm or hand becomes pale, cool, tingly, or numb. °This information is not intended to replace advice given to you by your health care provider. Make sure you discuss any questions you have with your health care provider. °Document Released: 10/20/2010 Document Revised: 02/23/2016 Document Reviewed: 04/05/2014 °Elsevier Interactive Patient Education © 2018 [employer] ° °

## 2018-03-20 ENCOUNTER — Encounter (HOSPITAL_COMMUNITY): Payer: Self-pay | Admitting: Cardiology

## 2018-03-20 MED FILL — Heparin Sod (Porcine)-NaCl IV Soln 1000 Unit/500ML-0.9%: INTRAVENOUS | Qty: 1000 | Status: AC

## 2018-04-01 DIAGNOSIS — E291 Testicular hypofunction: Secondary | ICD-10-CM | POA: Diagnosis not present

## 2018-04-08 ENCOUNTER — Telehealth (HOSPITAL_COMMUNITY): Payer: Self-pay

## 2018-04-08 NOTE — Telephone Encounter (Signed)
Dr.Stuckey stopped by to discuss symptoms pt has been experiencing. Pt has been very short of breath and just not feeling well. He thinks patient needs to be seen in clinic by DM. I called the patient and left VM on home and mobile phone to schedule him tomorrow with DM. We have two cancellations.

## 2018-04-09 ENCOUNTER — Other Ambulatory Visit: Payer: Self-pay

## 2018-04-09 ENCOUNTER — Ambulatory Visit (HOSPITAL_COMMUNITY)
Admission: RE | Admit: 2018-04-09 | Discharge: 2018-04-09 | Disposition: A | Discharge status: Home / Self Care | Payer: Medicare Other | Source: Ambulatory Visit | Attending: Cardiology | Admitting: Cardiology

## 2018-04-09 VITALS — BP 119/79 | HR 58 | Wt 233.5 lb

## 2018-04-09 DIAGNOSIS — I2510 Atherosclerotic heart disease of native coronary artery without angina pectoris: Secondary | ICD-10-CM | POA: Insufficient documentation

## 2018-04-09 DIAGNOSIS — E2740 Unspecified adrenocortical insufficiency: Secondary | ICD-10-CM | POA: Diagnosis not present

## 2018-04-09 DIAGNOSIS — Z79899 Other long term (current) drug therapy: Secondary | ICD-10-CM | POA: Insufficient documentation

## 2018-04-09 DIAGNOSIS — R0602 Shortness of breath: Secondary | ICD-10-CM

## 2018-04-09 DIAGNOSIS — E785 Hyperlipidemia, unspecified: Secondary | ICD-10-CM | POA: Insufficient documentation

## 2018-04-09 DIAGNOSIS — I429 Cardiomyopathy, unspecified: Secondary | ICD-10-CM | POA: Insufficient documentation

## 2018-04-09 DIAGNOSIS — Z955 Presence of coronary angioplasty implant and graft: Secondary | ICD-10-CM | POA: Insufficient documentation

## 2018-04-09 DIAGNOSIS — E039 Hypothyroidism, unspecified: Secondary | ICD-10-CM | POA: Diagnosis not present

## 2018-04-09 DIAGNOSIS — Z7982 Long term (current) use of aspirin: Secondary | ICD-10-CM | POA: Diagnosis not present

## 2018-04-09 DIAGNOSIS — I252 Old myocardial infarction: Secondary | ICD-10-CM | POA: Diagnosis not present

## 2018-04-09 DIAGNOSIS — R634 Abnormal weight loss: Secondary | ICD-10-CM

## 2018-04-09 NOTE — Progress Notes (Signed)
Patient ID: [name], [name], male   DOB: 1933/11/06, 84 y.o.   MRN: [id number]     Advanced Heart Failure Clinic Note   PCP: [name] HF: Dr. [name], [name] is a 84 y.o. male  with history of pituitary adenoma s/p resection, nonobstructive CAD, nonischemic cardiomyopathy, and chronic atrial bigeminy presents for cardiology followup.  Patient has a long history of atrial bigeminy.  He is in this today.  He had had a nonischemic cardiomyopathy with EF around 40% but most recent echo showed normal EF. Historically, he has not been able to tolerate statins, and he has been tried on multiple agents. He is now on Repatha.   In 11/15, he developed chest tightness and was admitted to [facility] with NSTEMI.  LHC showed 60-70% proximal LAD stenosis with totally occluded LCx.  This was opened with DES to the proximal LCx.  Echo post-cath showed EF 55-60%.  In 10/16, he developed typical angina.  LHC showed 95% proximal LAD stenosis that was treated with DES.    He can only tolerate Coreg 3.125 mg 1/2 tab twice a day.  He has had difficulty with BP-active meds in the setting of adrenal insufficiency post pituitary surgery.   Admitted 11/2016 for syncope. Thought to be in setting of adrenal insufficiency complicated by viral gastroenteritis.   He returns for followup of CAD and cardiomyopathy.  He has developed dyspnea over the last 6 2ks.  He notes shortness of breath walking across his house and walking to mailbox, also when he does yardwork. This is new.  He has bendopnea and also has symptoms consistent with PND.  No further chest pain.  Of note, he has been less active recently than in the past.   Given the above symptoms, I started him on low dose Lasix 20 mg daily. Echo in 6/19 showed EF 50-55% with inferolateral hypokinesis.  Cardiolite in 6/19 showed EF 47% with primarily fixed inferolateral defect.  With ongoing symptoms, I took him for RHC/LHC.  This showed nonobstructive coronray disease and  minimally elevated filling pressures.    He does not think Lasix has helped much.  He remains short of breath with exertion. Wife says that his mood is poor and that she is concerned that he may be depressed. Weight down 20 lbs, poor appetite.   REDS vest measurement: 29%  Labs (5/12): K 4.3, creatinine 1.03 Labs (2/15): K 4, creatinine 1.1, LDL 184 Labs (11/15): K 4.8, creatinine 1.07 Labs (3/16): K 4.4, creatinine 0.9 Labs (4/16): LDL 80, HDL 37 (on Repatha) Labs (5/16): HCT 42.7 Labs (10/16): creatinine 1.05 Labs (11/16): LDL 77, LDL-P 1283 Labs (6/18): LDL 119, HDL 42, K 4.1, creatinine 1.2 Labs (12/18): LDL 70, HDL 45 Labs (6/19): K 4, creatinine 1.23, BNP 128  PMH: 1. Hyperlipidemia: He has been unable to tolerate statins.  2. Chronic atrial bigeminy: x years.  3. Hypothyroidism: s/p surgery for pituitary adenoma.  4. Pituitary adenoma: s/p surgery in 3/11 for removal.  Now on thyroid replacement and hydrocortisone.  5. Cardiomyopathy: Nonischemic.  LHC (2009) with 50% mid LAD, 60% distal LAD.  Left ventriculogram (2009) with EF 40-45%.  Echo (8/13) with EF 35-40%, mild LVH.  Echo (11/14) with EF 40%, diffuse hypokinesis, normal RV.  He was tried in the past on an ACEI but apparently at that time began to have side effects from his pituitary adenoma that were thought initially to be ACEI-related.   He has been off  ACEI since that time.  He has had trouble tolerating more that 1/2 tab of 3.125 mg Coreg once a day. Echo (11/15) with EF 55-60%, aortic sclerosis without significant stenosis.  - Echo (3/18): EF 60-65%, no significant valvular abnormalities.  - Echo (6/19): EF 50-55%, mild LV dilation, inferolateral hypokinesis.  6. CAD: 2009 LHC with 50% mid, 60% distal LAD. NSTEMI 11/15 with 60-70% pLAD, totally occluded LCx with DES to proximal LCx.  EF 55-60% by LV-gram.  Angina with LHC 10/16 showing 95% pLAD, 40% mLAD, 40% dLAD, 70% AM2 => treated with DES to pLAD.  - Cardiolite  (6/19): EF 47%, primarily fixed inferolateral defect.  - RHC/LHC (6/19): mean RA 8, PA 31/17, mean PCWP 20, CI 2.64. 40% pLAD, patent proximal LAD stent, 60-70% distal LAD, patent LCx stent.  7. Mild cognitive impairment.  8. Carotid stenosis: Carotid dopplers (1/16) with 40-59% RICA stenosis.  - Carotid dopplers (1/17): Mild plaque only.   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   FH: Brother with CAD.  Review of systems complete and found to be negative unless listed in HPI.    Current Outpatient Medications  Medication Sig Dispense Refill  . alfuzosin (UROXATRAL) 10 MG 24 hr tablet Take 1 tablet (10 mg total) by mouth daily with breakfast. 30 tablet 2  . ALPRAZolam (XANAX) 0.5 MG tablet Take 0.5 mg by mouth as needed for anxiety.     Marland Kitchen aspirin 81 MG tablet Take 81 mg by mouth daily.      . carvedilol (COREG) 3.125 MG tablet Take 0.5 tablets (1.5625 mg total) by mouth 2 (two) times daily. 45 tablet 3  . Coenzyme Q10 (COQ10) 200 MG CAPS Take 1 capsule by mouth daily.      Marland Kitchen EPIPEN 2-PAK 0.3 MG/0.3ML SOAJ injection Inject 1 Dose as directed as needed.  0  . ergocalciferol (VITAMIN D2) 50000 UNITS capsule Take 50,000 Units by mouth 2 (two) times a week.     . Evolocumab (REPATHA) 140 MG/ML SOSY Inject into the skin every 14 (fourteen) days.    Marland Kitchen ezetimibe (ZETIA) 10 MG tablet Take 1 tablet (10 mg total) by mouth daily. 30 tablet 3  . finasteride (PROSCAR) 5 MG tablet Take 5 mg by mouth daily.      . furosemide (LASIX) 20 MG tablet Take 1 tablet (20 mg total) by mouth daily. And as prescribed by MD 35 tablet 2  . hydrocortisone (CORTEF) 10 MG tablet Take 20 mg by mouth 2 (two) times daily.    Marland Kitchen levothyroxine (SYNTHROID, LEVOTHROID) 150 MCG tablet Take 150 mcg by mouth daily before breakfast.     . MYRBETRIQ 50 MG TB24 tablet Take 50 mg by mouth daily.  0  . nitroGLYCERIN (NITROSTAT) 0.4 MG SL tablet Place 1 tablet (0.4 mg total) under the tongue every 5 (five)  minutes as needed for chest pain. 90 tablet 3  . Omega-3 1000 MG CAPS Take 1 capsule (1,000 mg total) by mouth 2 (two) times daily. 60 capsule 11  . omeprazole (PRILOSEC) 40 MG capsule Take 40 mg by mouth daily.  1  . potassium chloride (K-DUR) 10 MEQ tablet Take 1 tablet (10 mEq total) by mouth daily. 30 tablet 3  . Propylene Glycol (SYSTANE BALANCE OP) Apply 2 drops to eye daily as needed (dry eye).    . Red Yeast Rice Extract 600 MG CAPS Take 600 mg by mouth daily.    . tadalafil (CIALIS) 5 MG tablet Take 1  tablet (5 mg total) by mouth daily as needed for erectile dysfunction. Do not take nitro within 48 hrs after taking cialis 10 tablet 1  . testosterone cypionate (DEPOTESTOTERONE CYPIONATE) 200 MG/ML injection Inject into the muscle every 21 ( twenty-one) days. 1.62 % injection    . ticagrelor (BRILINTA) 60 MG TABS tablet Take 1 tablet (60 mg total) by mouth 2 (two) times daily. Needs an office visit 180 tablet 3   No current facility-administered medications for this encounter.     BP 119/79   Pulse (!) 58   Wt 233 lb 8 oz (105.9 kg)   SpO2 99%   BMI 29.19 kg/m   General: NAD Neck: No JVD, no thyromegaly or thyroid nodule.  Lungs: Clear to auscultation bilaterally with normal respiratory effort. CV: Nondisplaced PMI.  Heart regular S1/S2, no S3/S4, no murmur.  1+ edema 1/2 up lower legs bilaterally.  No carotid bruit.  Normal pedal pulses.  Abdomen: Soft, nontender, no hepatosplenomegaly, no distention.  Skin: Intact without lesions or rashes.  Neurologic: Alert and oriented x 3.  Psych: Normal affect. Extremities: No clubbing or cyanosis.  HEENT: Normal.   Assessment/Plan: 1. Cardiomyopathy: EF 40% with diffuse hypokinesis on 11/14 echo, but most recent echo showed EF up to 60-65% (3/18).  Echo in 6/19 with EF 50-55%, mildly dilated LV.  He is not volume overloaded on exam though symptoms are NYHA class III.  [place] in 6/19 was relatively well-compensated.   - Continue Coreg at  3.125 1/2 tab bid, he cannot tolerate a higher does due to orthostasis.   - Continue Lasix 20 mg daily with KCl 10 mEq daily. 2. CAD: s/p NSTEMI with DES to LCx then angina with DES to pLAD.  He has not tolerated multiple statins but is now on Repatha.  LHC was done in 6/19 due to equivocal Cardiolite and ongoing dyspnea, LHC showed no significantly obstructive disease.  - Will continue ASA 81 and Brilinta 60 mg BID. - Continue current Coreg.  - Continue Repatha.  3. Hyperlipidemia: He has not tolerated multiple statins. Has previously failed Crestor 5 mg two times/week due to myalgias.   - Continue Repatha, good lipids on this med.  4. Carotid bruit: 10/2016 dopplers with minimal stenosis. Unchanged from previous.  5. Exertional dyspnea: I have not found a good cardiac explanation for this. His weight loss is worrisome.  He never smoked.  He has not been anemic.  Hypothyroidism and adrenal insufficiency followed closely by PCP and seem to be stable.  - I will arrange for pulmonary evaluation with PFTs and CT chest w/o contrast (assess for interstitial lung disease and rule out tumor, etc).   - If pulmonary workup is unremarkable, it may be that his symptoms are due to deconditioning + depression.  I asked him to exercise more.  He is going to use a treadmill because of the heat outside.   Followup in 2 months.   [name] 04/09/2018

## 2018-04-09 NOTE — Addendum Note (Signed)
Encounter addended by: [name], MD on: 04/09/2018 11:41 PM  Actions taken: Sign clinical note, Visit diagnoses modified, LOS modified

## 2018-04-09 NOTE — Patient Instructions (Signed)
Your physician has recommended that you have a pulmonary function test. Pulmonary Function Tests are a group of tests that measure how well air moves in and out of your lungs.  Non-Cardiac CT scanning, (CAT scanning), is a noninvasive, special x-ray that produces cross-sectional images of the body using x-rays and a computer. CT scans help physicians diagnose and treat medical conditions. For some CT exams, a contrast material is used to enhance visibility in the area of the body being studied. CT scans provide greater clarity and reveal more details than regular x-ray exams.  Your physician recommends that you schedule a follow-up appointment in: 2 months

## 2018-04-17 ENCOUNTER — Ambulatory Visit (HOSPITAL_COMMUNITY)
Admission: RE | Admit: 2018-04-17 | Discharge: 2018-04-17 | Disposition: A | Discharge status: Home / Self Care | Payer: Medicare Other | Source: Ambulatory Visit | Attending: Cardiology | Admitting: Cardiology

## 2018-04-17 DIAGNOSIS — R0602 Shortness of breath: Secondary | ICD-10-CM

## 2018-04-17 DIAGNOSIS — K449 Diaphragmatic hernia without obstruction or gangrene: Secondary | ICD-10-CM | POA: Diagnosis not present

## 2018-04-17 DIAGNOSIS — E291 Testicular hypofunction: Secondary | ICD-10-CM | POA: Diagnosis not present

## 2018-04-17 DIAGNOSIS — R634 Abnormal weight loss: Secondary | ICD-10-CM

## 2018-04-17 DIAGNOSIS — I700 Atherosclerosis of aorta: Secondary | ICD-10-CM | POA: Diagnosis not present

## 2018-04-17 DIAGNOSIS — K8020 Calculus of gallbladder without cholecystitis without obstruction: Secondary | ICD-10-CM | POA: Insufficient documentation

## 2018-04-17 LAB — PULMONARY FUNCTION TEST
DL/VA % PRED: 57 %
DL/VA: 2.76 ml/min/mmHg/L
DLCO unc % pred: 51 %
DLCO unc: 20 ml/min/mmHg
FEF 25-75 POST: 4.47 L/s
FEF 25-75 Pre: 3.32 L/sec
FEF2575-%Change-Post: 34 %
FEF2575-%PRED-PRE: 139 %
FEF2575-%Pred-Post: 187 %
FEV1-%Change-Post: 5 %
FEV1-%Pred-Post: 106 %
FEV1-%Pred-Pre: 101 %
FEV1-Post: 3.68 L
FEV1-Pre: 3.51 L
FEV1FVC-%Change-Post: 0 %
FEV1FVC-%PRED-PRE: 109 %
FEV6-%Change-Post: 5 %
FEV6-%PRED-POST: 104 %
FEV6-%Pred-Pre: 99 %
FEV6-POST: 4.74 L
FEV6-Pre: 4.51 L
FEV6FVC-%CHANGE-POST: 0 %
FEV6FVC-%PRED-POST: 105 %
FEV6FVC-%Pred-Pre: 106 %
FVC-%Change-Post: 5 %
FVC-%PRED-PRE: 93 %
FVC-%Pred-Post: 98 %
FVC-PRE: 4.51 L
FVC-Post: 4.76 L
PRE FEV1/FVC RATIO: 78 %
Post FEV1/FVC ratio: 77 %
Post FEV6/FVC ratio: 100 %
Pre FEV6/FVC Ratio: 100 %
RV % pred: 100 %
RV: 2.96 L
TLC % PRED: 93 %
TLC: 7.54 L

## 2018-04-17 MED ORDER — ALBUTEROL SULFATE (2.5 MG/3ML) 0.083% IN NEBU
2.5000 mg | INHALATION_SOLUTION | Freq: Once | RESPIRATORY_TRACT | Status: AC
  Administered 2018-04-17: 2.5 mg via RESPIRATORY_TRACT

## 2018-04-29 DIAGNOSIS — E291 Testicular hypofunction: Secondary | ICD-10-CM | POA: Diagnosis not present

## 2018-05-12 ENCOUNTER — Other Ambulatory Visit (HOSPITAL_COMMUNITY): Payer: Self-pay

## 2018-05-12 DIAGNOSIS — I2510 Atherosclerotic heart disease of native coronary artery without angina pectoris: Secondary | ICD-10-CM

## 2018-05-12 DIAGNOSIS — I428 Other cardiomyopathies: Secondary | ICD-10-CM

## 2018-05-12 MED ORDER — CARVEDILOL 3.125 MG PO TABS: 1.5625 mg | ORAL_TABLET | Freq: Two times a day (BID) | ORAL | 3 refills | Status: DC

## 2018-05-15 DIAGNOSIS — E7849 Other hyperlipidemia: Secondary | ICD-10-CM | POA: Diagnosis not present

## 2018-05-15 DIAGNOSIS — I10 Essential (primary) hypertension: Secondary | ICD-10-CM | POA: Diagnosis not present

## 2018-05-15 DIAGNOSIS — R7301 Impaired fasting glucose: Secondary | ICD-10-CM | POA: Diagnosis not present

## 2018-05-15 DIAGNOSIS — Z125 Encounter for screening for malignant neoplasm of prostate: Secondary | ICD-10-CM | POA: Diagnosis not present

## 2018-05-15 DIAGNOSIS — E038 Other specified hypothyroidism: Secondary | ICD-10-CM | POA: Diagnosis not present

## 2018-05-15 DIAGNOSIS — R82998 Other abnormal findings in urine: Secondary | ICD-10-CM | POA: Diagnosis not present

## 2018-05-15 DIAGNOSIS — E559 Vitamin D deficiency, unspecified: Secondary | ICD-10-CM | POA: Diagnosis not present

## 2018-05-16 ENCOUNTER — Other Ambulatory Visit (HOSPITAL_COMMUNITY): Payer: Self-pay

## 2018-05-16 MED ORDER — EZETIMIBE 10 MG PO TABS: 10.0000 mg | ORAL_TABLET | Freq: Every day | ORAL | 3 refills | Status: DC

## 2018-05-22 DIAGNOSIS — I255 Ischemic cardiomyopathy: Secondary | ICD-10-CM | POA: Diagnosis not present

## 2018-05-22 DIAGNOSIS — D352 Benign neoplasm of pituitary gland: Secondary | ICD-10-CM | POA: Diagnosis not present

## 2018-05-22 DIAGNOSIS — Z6831 Body mass index (BMI) 31.0-31.9, adult: Secondary | ICD-10-CM | POA: Diagnosis not present

## 2018-05-22 DIAGNOSIS — E2740 Unspecified adrenocortical insufficiency: Secondary | ICD-10-CM | POA: Diagnosis not present

## 2018-05-22 DIAGNOSIS — E7849 Other hyperlipidemia: Secondary | ICD-10-CM | POA: Diagnosis not present

## 2018-05-22 DIAGNOSIS — R7301 Impaired fasting glucose: Secondary | ICD-10-CM | POA: Diagnosis not present

## 2018-05-22 DIAGNOSIS — I779 Disorder of arteries and arterioles, unspecified: Secondary | ICD-10-CM | POA: Diagnosis not present

## 2018-05-22 DIAGNOSIS — I2510 Atherosclerotic heart disease of native coronary artery without angina pectoris: Secondary | ICD-10-CM | POA: Diagnosis not present

## 2018-05-22 DIAGNOSIS — R0609 Other forms of dyspnea: Secondary | ICD-10-CM | POA: Diagnosis not present

## 2018-05-22 DIAGNOSIS — E2749 Other adrenocortical insufficiency: Secondary | ICD-10-CM | POA: Diagnosis not present

## 2018-05-22 DIAGNOSIS — I10 Essential (primary) hypertension: Secondary | ICD-10-CM | POA: Diagnosis not present

## 2018-05-22 DIAGNOSIS — Z23 Encounter for immunization: Secondary | ICD-10-CM | POA: Diagnosis not present

## 2018-05-22 DIAGNOSIS — Z0000 Encounter for general adult medical examination without abnormal findings: Secondary | ICD-10-CM | POA: Diagnosis not present

## 2018-05-23 DIAGNOSIS — Z1212 Encounter for screening for malignant neoplasm of rectum: Secondary | ICD-10-CM | POA: Diagnosis not present

## 2018-05-27 ENCOUNTER — Encounter (HOSPITAL_COMMUNITY): Payer: Self-pay | Admitting: Cardiology

## 2018-05-27 ENCOUNTER — Ambulatory Visit (HOSPITAL_COMMUNITY)
Admission: RE | Admit: 2018-05-27 | Discharge: 2018-05-27 | Disposition: A | Discharge status: Home / Self Care | Payer: Medicare Other | Source: Ambulatory Visit | Attending: Cardiology | Admitting: Cardiology

## 2018-05-27 VITALS — BP 124/78 | HR 57 | Wt 238.0 lb

## 2018-05-27 DIAGNOSIS — E785 Hyperlipidemia, unspecified: Secondary | ICD-10-CM | POA: Insufficient documentation

## 2018-05-27 DIAGNOSIS — R0609 Other forms of dyspnea: Secondary | ICD-10-CM | POA: Diagnosis not present

## 2018-05-27 DIAGNOSIS — R0602 Shortness of breath: Secondary | ICD-10-CM | POA: Diagnosis not present

## 2018-05-27 DIAGNOSIS — Z79899 Other long term (current) drug therapy: Secondary | ICD-10-CM | POA: Diagnosis not present

## 2018-05-27 DIAGNOSIS — Z7982 Long term (current) use of aspirin: Secondary | ICD-10-CM | POA: Diagnosis not present

## 2018-05-27 DIAGNOSIS — I252 Old myocardial infarction: Secondary | ICD-10-CM | POA: Insufficient documentation

## 2018-05-27 DIAGNOSIS — I429 Cardiomyopathy, unspecified: Secondary | ICD-10-CM

## 2018-05-27 DIAGNOSIS — I2510 Atherosclerotic heart disease of native coronary artery without angina pectoris: Secondary | ICD-10-CM

## 2018-05-27 DIAGNOSIS — R0989 Other specified symptoms and signs involving the circulatory and respiratory systems: Secondary | ICD-10-CM | POA: Diagnosis not present

## 2018-05-27 DIAGNOSIS — Z7902 Long term (current) use of antithrombotics/antiplatelets: Secondary | ICD-10-CM | POA: Insufficient documentation

## 2018-05-27 DIAGNOSIS — Z79890 Hormone replacement therapy (postmenopausal): Secondary | ICD-10-CM | POA: Diagnosis not present

## 2018-05-27 DIAGNOSIS — E039 Hypothyroidism, unspecified: Secondary | ICD-10-CM | POA: Insufficient documentation

## 2018-05-27 MED ORDER — FUROSEMIDE 20 MG PO TABS: 40.0000 mg | ORAL_TABLET | Freq: Every day | ORAL | 3 refills | Status: DC

## 2018-05-27 MED ORDER — POTASSIUM CHLORIDE ER 10 MEQ PO TBCR: 20.0000 meq | EXTENDED_RELEASE_TABLET | Freq: Every day | ORAL | 3 refills | Status: DC

## 2018-05-27 MED ORDER — CLOPIDOGREL BISULFATE 75 MG PO TABS: 75.0000 mg | ORAL_TABLET | Freq: Every day | ORAL | 11 refills | Status: DC

## 2018-05-27 NOTE — Patient Instructions (Signed)
STOP Brilinta.  START Plavix 75 mg tablet once daily.  INCREASE Lasix to 40 mg (2 tabs) once daily.  INCREASE Potassium to 20 meq (2 tabs) once daily.  Return end of next week for lab work.  ____________________________________________________________ [name] Code: 1600  Follow up 6 weeks with Dr. Aundra Dubin.  ____________________________________________________________ [name] Code: 1700  Take all medication as prescribed the day of your appointment. Bring all medications with you to your appointment.  Do the following things EVERYDAY: 1) Weigh yourself in the morning before breakfast. Write it down and keep it in a log. 2) Take your medicines as prescribed 3) Eat low salt foods-Limit salt (sodium) to 2000 mg per day.  4) Stay as active as you can everyday 5) Limit all fluids for the day to less than 2 liters

## 2018-05-28 DIAGNOSIS — E291 Testicular hypofunction: Secondary | ICD-10-CM | POA: Diagnosis not present

## 2018-05-28 NOTE — Progress Notes (Signed)
Patient ID: [name], [name], male   DOB: November 08, 1933, 84 y.o.   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  Patient has a long history of atrial bigeminy.  He is in this today.  He had had a nonischemic cardiomyopathy with EF around 40% but most recent echo showed normal EF. Historically, he has not been able to tolerate statins, and he has been tried on multiple agents. He is now on Repatha.   In 11/15, he developed chest tightness and was admitted to [facility] with NSTEMI.  LHC showed 60-70% proximal LAD stenosis with totally occluded LCx.  This was opened with DES to the proximal LCx.  Echo post-cath showed EF 55-60%.  In 10/16, he developed typical angina.  LHC showed 95% proximal LAD stenosis that was treated with DES.    He can only tolerate Coreg 3.125 mg 1/2 tab twice a day.  He has had difficulty with BP-active meds in the setting of adrenal insufficiency post pituitary surgery.   Admitted 11/2016 for syncope. Thought to be in setting of adrenal insufficiency complicated by viral gastroenteritis.   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 He has also had PND-like symptoms.   Given worsening symptoms, I started him on low dose Lasix 20 mg daily. Echo in 6/19 showed EF 50-55% with inferolateral hypokinesis.  Cardiolite in 6/19 showed EF 47% with primarily fixed inferolateral defect.  I took him for RHC/LHC.  This showed nonobstructive coronary disease and minimally elevated filling pressures.  PFTs in 7/19 were unremarkable except for decreased DLCO.   He continues to have similar symptoms though his wife  thinks he is somewhat better with more exercise.  He wonders if Brilinta is causing his symptoms.  This would be unusual, as he had been on Brilinta for a year with no problems prior to developing dyspnea more recently.    Labs (5/12): K 4.3, creatinine 1.03 Labs (2/15): K 4, creatinine 1.1, LDL 184 Labs (11/15): K 4.8, creatinine 1.07 Labs (3/16): K 4.4, creatinine 0.9 Labs (4/16): LDL 80, HDL 37 (on Repatha) Labs (5/16): HCT 42.7 Labs (10/16): creatinine 1.05 Labs (11/16): LDL 77, LDL-P 1283 Labs (6/18): LDL 119, HDL 42, K 4.1, creatinine 1.2 Labs (12/18): LDL 70, HDL 45 Labs (6/19): K 4, creatinine 1.23, BNP 128 Labs (8/19): K 4, creatinine 1.5, HDL 41, LDL 75, hgb 14.8  PMH: 1. Hyperlipidemia: He has been unable to tolerate statins.  2. Chronic atrial bigeminy: x years.  3. Hypothyroidism: s/p surgery for pituitary adenoma.  4. Pituitary adenoma: s/p surgery in 3/11 for removal.  Now on thyroid replacement and hydrocortisone.  5. Cardiomyopathy: Nonischemic.  LHC (2009) with 50% mid LAD, 60% distal LAD.  Left ventriculogram (2009) with EF 40-45%.  Echo (8/13) with EF 35-40%, mild LVH.  Echo (11/14) with EF 40%, diffuse hypokinesis, normal RV.  He was tried in the past on an ACEI but apparently at that time began to have side effects from his pituitary adenoma that were thought initially to be ACEI-related.   He has been off ACEI since that time.  He has  had trouble tolerating more that 1/2 tab of 3.125 mg Coreg once a day. Echo (11/15) with EF 55-60%, aortic sclerosis without significant stenosis.  - Echo (3/18): EF 60-65%, no significant valvular abnormalities.  - Echo (6/19): EF 50-55%, mild LV dilation, inferolateral hypokinesis.  6. CAD: 2009 LHC with 50% mid, 60% distal LAD. NSTEMI 11/15 with 60-70% pLAD, totally occluded LCx with DES to proximal LCx.  EF 55-60% by LV-gram.  Angina with LHC 10/16 showing 95% pLAD, 40% mLAD, 40% dLAD, 70% AM2 => treated with DES to pLAD.  - Cardiolite  (6/19): EF 47%, primarily fixed inferolateral defect.  - RHC/LHC (6/19): mean RA 8, PA 31/17, mean PCWP 20, CI 2.64. 40% pLAD, patent proximal LAD stent, 60-70% distal LAD, patent LCx stent.  7. Mild cognitive impairment.  8. Carotid stenosis: Carotid dopplers (1/16) with 40-59% RICA stenosis.  - Carotid dopplers (1/17): Mild plaque only.  9. PFTs (7/19): unremarkable except for decreased DLCO.  10. CT chest (7/19): No significant pulmonary disease.   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   FH: Brother with CAD.  Review of systems complete and found to be negative unless listed in HPI.    Current Outpatient Medications  Medication Sig Dispense Refill  . alfuzosin (UROXATRAL) 10 MG 24 hr tablet Take 1 tablet (10 mg total) by mouth daily with breakfast. 30 tablet 2  . ALPRAZolam (XANAX) 0.5 MG tablet Take 0.5 mg by mouth as needed for anxiety.     Marland Kitchen aspirin 81 MG tablet Take 81 mg by mouth daily.      . carvedilol (COREG) 3.125 MG tablet Take 0.5 tablets (1.5625 mg total) by mouth 2 (two) times daily. 45 tablet 3  . Coenzyme Q10 (COQ10) 200 MG CAPS Take 1 capsule by mouth daily.      Marland Kitchen EPIPEN 2-PAK 0.3 MG/0.3ML SOAJ injection Inject 1 Dose as directed as needed.  0  . ergocalciferol (VITAMIN D2) 50000 UNITS capsule Take 50,000 Units by mouth 2 (two) times a week.     . Evolocumab (REPATHA) 140 MG/ML SOSY Inject into the skin every 14 (fourteen) days.    Marland Kitchen ezetimibe (ZETIA) 10 MG tablet Take 1 tablet (10 mg total) by mouth daily. 30 tablet 3  . finasteride (PROSCAR) 5 MG tablet Take 5 mg by mouth daily.      . furosemide (LASIX) 20 MG tablet Take 2 tablets (40 mg total) by mouth daily. 180 tablet 3  . hydrocortisone (CORTEF) 10 MG tablet Take 20 mg by mouth 2 (two) times daily.    Marland Kitchen levothyroxine (SYNTHROID, LEVOTHROID) 150 MCG tablet Take 150 mcg by mouth daily before breakfast.     . MYRBETRIQ 50 MG TB24 tablet Take 50 mg by mouth daily.  0  . nitroGLYCERIN  (NITROSTAT) 0.4 MG SL tablet Place 1 tablet (0.4 mg total) under the tongue every 5 (five) minutes as needed for chest pain. 90 tablet 3  . Omega-3 1000 MG CAPS Take 1 capsule (1,000 mg total) by mouth 2 (two) times daily. 60 capsule 11  . omeprazole (PRILOSEC) 40 MG capsule Take 40 mg by mouth daily.  1  . potassium chloride (K-DUR) 10 MEQ tablet Take 2 tablets (20 mEq total) by mouth daily. 180 tablet 3  . Propylene Glycol (SYSTANE BALANCE OP) Apply 2 drops to eye daily as needed (dry eye).    . Red Yeast Rice Extract 600 MG CAPS Take 600 mg by mouth daily.    . tadalafil (  CIALIS) 5 MG tablet Take 1 tablet (5 mg total) by mouth daily as needed for erectile dysfunction. Do not take nitro within 48 hrs after taking cialis 10 tablet 1  . testosterone cypionate (DEPOTESTOTERONE CYPIONATE) 200 MG/ML injection Inject into the muscle every 21 ( twenty-one) days. 1.62 % injection    . clopidogrel (PLAVIX) 75 MG tablet Take 1 tablet (75 mg total) by mouth daily. 30 tablet 11   No current facility-administered medications for this encounter.     BP 124/78 (BP Location: Right Arm, Patient Position: Sitting, Cuff Size: Normal)   Pulse (!) 57   Wt 108 kg (238 lb)   SpO2 98%   BMI 29.75 kg/m   General: NAD Neck: No JVD, no thyromegaly or thyroid nodule.  Lungs: Clear to auscultation bilaterally with normal respiratory effort. CV: Nondisplaced PMI.  Heart regular S1/S2, no S3/S4, no murmur. 1+ edema 1/2 up lower legs bilaterally.  No carotid bruit.  Normal pedal pulses.  Abdomen: Soft, nontender, no hepatosplenomegaly, no distention.  Skin: Intact without lesions or rashes.  Neurologic: Alert and oriented x 3.  Psych: Normal affect. Extremities: No clubbing or cyanosis.  HEENT: Normal.   Assessment/Plan: 1. Cardiomyopathy: EF 40% with diffuse hypokinesis on 11/14 echo, but most recent echo showed EF up to 60-65% (3/18).  Echo in 6/19 with EF 50-55%, mildly dilated LV.  [place] in 6/19 was relatively  well-compensated with mildly elevated filling pressures.  Today, NYHA class II-III symptoms persist with possible PND. He does not have JVD but does have peripheral edema.  It is possible that his symptoms are related to volume overload.    - Continue Coreg at 3.125 1/2 tab bid, he cannot tolerate a higher does due to orthostasis.   - Increase Lasix to 40 mg daily with KCL 20 mEq daily. BMET in 10 days.   2. CAD: s/p NSTEMI with DES to LCx then angina with DES to pLAD.  He has not tolerated multiple statins but is now on Repatha.  LHC was done in 6/19 due to equivocal Cardiolite and ongoing dyspnea, LHC showed no significantly obstructive disease.  - Will continue ASA 81.  - Given late onset of dyspnea compared to time of Brilinta initiation, doubt this is the cause of his dyspnea.  However, it would be reasonable to stop Brilinta at this point.  I will have him take Plavix 75 mg daily instead.  - Continue current Coreg.  - Continue Repatha.  3. Hyperlipidemia: He has not tolerated multiple statins. Has previously failed Crestor 5 mg two times/week due to myalgias.   - Continue Repatha, good lipids on this med.  4. Carotid bruit: 10/2016 dopplers with minimal stenosis. Unchanged from previous.  5. Exertional dyspnea: Persistent.  As above, may have mild volume overload from diastolic CHF => will increase Lasix as above.  Will also try switching him off Brilinta.  He has not been anemic.  Hypothyroidism and adrenal insufficiency followed closely by PCP and seem to be stable. PFTs and CT chest were unremarkable.   - Continue regular exercise.  - Increase Lasix and stop Brilinta.  - If the above steps do not help, would recommend CPX and sleep study.  I think it would be reasonable to go ahead and have him do a sleep study, but he wants to wait and see if he gets better.    Followup in 6 wks.   [name] 05/28/2018

## 2018-05-30 ENCOUNTER — Telehealth (HOSPITAL_COMMUNITY): Payer: Self-pay

## 2018-05-30 NOTE — Telephone Encounter (Signed)
Left VM for pt 

## 2018-05-30 NOTE — Telephone Encounter (Signed)
Pt called and stated that he wanted to know if he should be taking plavix and carvedilol together. Pt also stated that he read somewhere that you should not mix the two. Please advise.

## 2018-05-30 NOTE — Telephone Encounter (Signed)
There is no problem with taking these two medications together.

## 2018-06-04 ENCOUNTER — Ambulatory Visit (HOSPITAL_COMMUNITY)
Admission: RE | Admit: 2018-06-04 | Discharge: 2018-06-04 | Disposition: A | Discharge status: Home / Self Care | Payer: Medicare Other | Source: Ambulatory Visit | Attending: Cardiology | Admitting: Cardiology

## 2018-06-04 DIAGNOSIS — I429 Cardiomyopathy, unspecified: Secondary | ICD-10-CM | POA: Insufficient documentation

## 2018-06-04 LAB — BASIC METABOLIC PANEL
Anion gap: 9 (ref 5–15)
BUN: 33 mg/dL — ABNORMAL HIGH (ref 8–23)
CHLORIDE: 106 mmol/L (ref 98–111)
CO2: 28 mmol/L (ref 22–32)
CREATININE: 1.66 mg/dL — AB (ref 0.61–1.24)
Calcium: 9.9 mg/dL (ref 8.9–10.3)
GFR calc Af Amer: 43 mL/min — ABNORMAL LOW (ref >60)
GFR, EST NON AFRICAN AMERICAN: 37 mL/min — AB (ref >60)
GLUCOSE: 101 mg/dL — AB (ref 70–99)
POTASSIUM: 4.2 mmol/L (ref 3.5–5.1)
Sodium: 143 mmol/L (ref 135–145)

## 2018-06-05 ENCOUNTER — Telehealth (HOSPITAL_COMMUNITY): Payer: Self-pay | Admitting: *Deleted

## 2018-06-05 MED ORDER — FUROSEMIDE 20 MG PO TABS: 20.0000 mg | ORAL_TABLET | Freq: Every day | ORAL | 3 refills | Status: DC

## 2018-06-05 NOTE — Telephone Encounter (Signed)
Result Notes for Basic metabolic panel   Notes recorded by Darron Doom, RN on 06/05/2018 at 10:32 AM EDT Called and spoke with patient's wife, she's agreeable with plan and will let patient know. MAR updated. ------  Notes recorded by [name], MD on 06/04/2018 at 9:42 PM EDT Hold Lasix for a day and decrease back to 20 mg daily with elevated creatinine.

## 2018-06-11 ENCOUNTER — Encounter (HOSPITAL_COMMUNITY): Payer: Medicare Other | Admitting: Cardiology

## 2018-06-24 DIAGNOSIS — J3081 Allergic rhinitis due to animal (cat) (dog) hair and dander: Secondary | ICD-10-CM | POA: Diagnosis not present

## 2018-06-24 DIAGNOSIS — J301 Allergic rhinitis due to pollen: Secondary | ICD-10-CM | POA: Diagnosis not present

## 2018-06-24 DIAGNOSIS — J3089 Other allergic rhinitis: Secondary | ICD-10-CM | POA: Diagnosis not present

## 2018-06-24 DIAGNOSIS — R0602 Shortness of breath: Secondary | ICD-10-CM | POA: Diagnosis not present

## 2018-06-27 ENCOUNTER — Ambulatory Visit (INDEPENDENT_AMBULATORY_CARE_PROVIDER_SITE_OTHER): Payer: Medicare Other | Admitting: Pulmonary Disease

## 2018-06-27 ENCOUNTER — Other Ambulatory Visit (INDEPENDENT_AMBULATORY_CARE_PROVIDER_SITE_OTHER): Payer: Medicare Other

## 2018-06-27 ENCOUNTER — Encounter: Payer: Self-pay | Admitting: Pulmonary Disease

## 2018-06-27 VITALS — BP 124/62 | HR 99 | Ht 75.0 in | Wt 242.0 lb

## 2018-06-27 DIAGNOSIS — I429 Cardiomyopathy, unspecified: Secondary | ICD-10-CM

## 2018-06-27 DIAGNOSIS — I272 Pulmonary hypertension, unspecified: Secondary | ICD-10-CM

## 2018-06-27 DIAGNOSIS — R0602 Shortness of breath: Secondary | ICD-10-CM

## 2018-06-27 DIAGNOSIS — R942 Abnormal results of pulmonary function studies: Secondary | ICD-10-CM

## 2018-06-27 DIAGNOSIS — K449 Diaphragmatic hernia without obstruction or gangrene: Secondary | ICD-10-CM | POA: Insufficient documentation

## 2018-06-27 DIAGNOSIS — I2510 Atherosclerotic heart disease of native coronary artery without angina pectoris: Secondary | ICD-10-CM

## 2018-06-27 LAB — CBC WITH DIFFERENTIAL/PLATELET
BASOS PCT: 2 % (ref 0.0–3.0)
Basophils Absolute: 0.2 10*3/uL — ABNORMAL HIGH (ref 0.0–0.1)
EOS PCT: 3.8 % (ref 0.0–5.0)
Eosinophils Absolute: 0.3 10*3/uL (ref 0.0–0.7)
HEMATOCRIT: 42.8 % (ref 39.0–52.0)
Hemoglobin: 14.5 g/dL (ref 13.0–17.0)
LYMPHS ABS: 1.7 10*3/uL (ref 0.7–4.0)
Lymphocytes Relative: 21.6 % (ref 12.0–46.0)
MCHC: 33.8 g/dL (ref 30.0–36.0)
MCV: 91.7 fl (ref 78.0–100.0)
MONO ABS: 0.6 10*3/uL (ref 0.1–1.0)
Monocytes Relative: 7.1 % (ref 3.0–12.0)
Neutro Abs: 5.2 10*3/uL (ref 1.4–7.7)
Neutrophils Relative %: 65.5 % (ref 43.0–77.0)
Platelets: 229 10*3/uL (ref 150.0–400.0)
RBC: 4.67 Mil/uL (ref 4.22–5.81)
RDW: 15 % (ref 11.5–15.5)
WBC: 8 10*3/uL (ref 4.0–10.5)

## 2018-06-27 MED ORDER — ALBUTEROL SULFATE HFA 108 (90 BASE) MCG/ACT IN AERS: 2.0000 | INHALATION_SPRAY | Freq: Four times a day (QID) | RESPIRATORY_TRACT | 6 refills | Status: DC | PRN

## 2018-06-27 NOTE — Progress Notes (Signed)
Synopsis: Referred in September 2019 for shortness of breath by [name], MD  Subjective:   PATIENT ID: [name], [name] GENDER: male DOB: [birth year], MRN: [id number]  Chief Complaint  Patient presents with  . Consult    SOB with exertion, worse when he bends over. Denies chest pain.     PMH of cardiomyopathy, MI, stent placement, CAD, HLD, follows with Dr. Aundra Dubin. 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 Past 6 months worsening SOB. 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 He goes to the gym every day for an 1 hour. He never wheezes or has chest tightness. He feels like he runs out of breath. He was on allergy shots for a long time. He associates some of this of breath starting around the time he quit allergy shots back in June..  Since shortness of breath with exertion.  He is able to go to the gym and walk for nearly 30 minutes without stopping.  He is able to get his heart rate up into the low 100s with exercise.  Patient denies chest pain, nausea vomiting, cough with sputum production.  He does have nocturnal dyspnea.  He wakes up in the middle of the night feeling short of breath and will have to sit in the den or in his office upright for a few hours before he feels comfortable enough to lay flat again.   Past Medical History:  Diagnosis Date  . Atrial premature beats   . BPH (benign prostatic hyperplasia)   . BPH (benign prostatic hypertrophy)   . Cataract, right eye   . Chronic low back pain   . Colon polyp   . Coronary artery disease   . DDD (degenerative disc disease), lumbar   . Dermatitis 11/16  . Hyperlipidemia   . Insomnia   . Kidney cysts   . Memory loss 2015  . Non-ischemic cardiomyopathy ([place])   . NSTEMI (non-ST elevated myocardial infarction) ([place]) 08/25/2014  . Osteoarthritis of right knee   . Pituitary tumor 2011   macroadenoma w/ VF compromise-then  gamma knife a few years leater for some recurrence  . Status post insertion of drug-eluting stent into left anterior descending (LAD) artery 08/28/15   + nuc study.   . Urinary retention   . Vitamin D deficiency 01/2008     Family History  Problem Relation Age of Onset  . Congestive Heart Failure Father 36  . Other Mother 73  . Coronary artery disease Brother   . Other Sister 40       polio  . Heart attack Brother        MI/ASCAD 2006  . COPD Sister 48     Past Surgical History:  Procedure Laterality Date  . ARTHROSCOPIC SURGERY    . BACK SURGERY    . CARDIAC CATHETERIZATION  07/27/2015   Dr Aundra Dubin; oLAD 95%, mLAD 50%, CFX stent OK, OM1 60%, OM2 99%, pPLOM 50%, RCA irregular, AM2 70%, EF 55-60%, no regional wall motion abnormalities.     Marland Kitchen CARDIAC CATHETERIZATION N/A 07/28/2015   Procedure: Left Heart Cath and Coronary Angiography;  Surgeon: [name], MD;  Location: [place] [facility];  Service: Cardiovascular;  Laterality: N/A;  . CARDIAC CATHETERIZATION N/A 07/28/2015   Procedure: Coronary Stent Intervention;  Surgeon: Troy Sine, MD;  Location: [facility];  Service: Cardiovascular;  Laterality: N/A;  .  CATARACT EXTRACTION    . CORONARY ANGIOPLASTY WITH STENT PLACEMENT  07/27/2015   XIENCE ALPINE RX 3.0X33 DES to the LAD, 95%>>0  . HAMMER  BUNION TOE SURGERY    . HAND SURGERY Left   . LEFT HEART CATHETERIZATION WITH CORONARY ANGIOGRAM N/A 08/25/2014   Procedure: LEFT HEART CATHETERIZATION WITH CORONARY ANGIOGRAM;  Surgeon: Burnell Blanks, MD;  Location: [facility];  Service: Cardiovascular;  Laterality: N/A;  . PITUITARY SURGERY     12/01/09 Dr. Wilburn Cornelia and [name].  Marland Kitchen RIGHT/LEFT HEART CATH AND CORONARY ANGIOGRAPHY N/A 03/19/2018   Procedure: RIGHT/LEFT HEART CATH AND CORONARY ANGIOGRAPHY;  Surgeon: [name], MD;  Location: [place] [facility];  Service: Cardiovascular;  Laterality: N/A;    Social History   Socioeconomic History  .  Marital status: Married    Spouse name: [name]  . Number of children: 2  . Years of education: College  . Highest education level: Not on file  Occupational History  . Occupation: retired Programmer, multimedia: RETIRED  Social Needs  . Financial resource strain: Not on file  . Food insecurity:    Worry: Not on file    Inability: Not on file  . Transportation needs:    Medical: Not on file    Non-medical: Not on file  Tobacco Use  . Smoking status: Never Smoker  . Smokeless tobacco: Never Used  Substance and Sexual Activity  . Alcohol use: Yes    Comment: occas.  . Drug use: No  . Sexual activity: Not on file  Lifestyle  . Physical activity:    Days per week: Not on file    Minutes per session: Not on file  . Stress: Not on file  Relationships  . Social connections:    Talks on phone: Not on file    Gets together: Not on file    Attends religious service: Not on file    Active member of club or organization: Not on file    Attends meetings of clubs or organizations: Not on file    Relationship status: Not on file  . Intimate partner violence:    Fear of current or ex partner: Not on file    Emotionally abused: Not on file    Physically abused: Not on file    Forced sexual activity: Not on file  Other Topics Concern  . Not on file  Social History Narrative   Patient is married [name]).   Patient has two children.   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   Patient is right-handed.   Patient does not drink any caffeine.     Allergies  Allergen Reactions  . Sulfamethoxazole Anaphylaxis and Swelling  . Bee Venom Rash  . Sulfa Antibiotics Nausea And Vomiting  . Sulfonamide Derivatives Nausea And Vomiting  . Tamsulosin     Unknown pt doesn't think he is allergic      Outpatient Medications Prior to Visit  Medication Sig Dispense Refill  . alfuzosin (UROXATRAL) 10 MG 24 hr tablet Take 1 tablet (10 mg total) by mouth daily with  breakfast. 30 tablet 2  . ALPRAZolam (XANAX) 0.5 MG tablet Take 0.5 mg by mouth as needed for anxiety.     Marland Kitchen aspirin 81 MG tablet Take 81 mg by mouth daily.      . beclomethasone (QVAR) 80 MCG/ACT inhaler Inhale 1 puff into the lungs 2 (two) times daily.    . carvedilol (COREG)  3.125 MG tablet Take 0.5 tablets (1.5625 mg total) by mouth 2 (two) times daily. 45 tablet 3  . clopidogrel (PLAVIX) 75 MG tablet Take 1 tablet (75 mg total) by mouth daily. 30 tablet 11  . Coenzyme Q10 (COQ10) 200 MG CAPS Take 1 capsule by mouth daily.      Marland Kitchen EPIPEN 2-PAK 0.3 MG/0.3ML SOAJ injection Inject 1 Dose as directed as needed.  0  . ergocalciferol (VITAMIN D2) 50000 UNITS capsule Take 50,000 Units by mouth 2 (two) times a week.     . Evolocumab (REPATHA) 140 MG/ML SOSY Inject into the skin every 14 (fourteen) days.    Marland Kitchen ezetimibe (ZETIA) 10 MG tablet Take 1 tablet (10 mg total) by mouth daily. 30 tablet 3  . finasteride (PROSCAR) 5 MG tablet Take 5 mg by mouth daily.      . furosemide (LASIX) 20 MG tablet Take 1 tablet (20 mg total) by mouth daily. 90 tablet 3  . hydrocortisone (CORTEF) 10 MG tablet Take 20 mg by mouth 2 (two) times daily.    Marland Kitchen levothyroxine (SYNTHROID, LEVOTHROID) 150 MCG tablet Take 150 mcg by mouth daily before breakfast.     . MYRBETRIQ 50 MG TB24 tablet Take 50 mg by mouth daily.  0  . nitroGLYCERIN (NITROSTAT) 0.4 MG SL tablet Place 1 tablet (0.4 mg total) under the tongue every 5 (five) minutes as needed for chest pain. 90 tablet 3  . Omega-3 1000 MG CAPS Take 1 capsule (1,000 mg total) by mouth 2 (two) times daily. 60 capsule 11  . omeprazole (PRILOSEC) 40 MG capsule Take 40 mg by mouth daily.  1  . potassium chloride (K-DUR) 10 MEQ tablet Take 2 tablets (20 mEq total) by mouth daily. 180 tablet 3  . Propylene Glycol (SYSTANE BALANCE OP) Apply 2 drops to eye daily as needed (dry eye).    . Red Yeast Rice Extract 600 MG CAPS Take 600 mg by mouth daily.    . tadalafil (CIALIS) 5 MG tablet  Take 1 tablet (5 mg total) by mouth daily as needed for erectile dysfunction. Do not take nitro within 48 hrs after taking cialis 10 tablet 1  . testosterone cypionate (DEPOTESTOTERONE CYPIONATE) 200 MG/ML injection Inject into the muscle every 21 ( twenty-one) days. 1.62 % injection     No facility-administered medications prior to visit.     Review of Systems  Constitutional: Negative.   HENT: Negative.   Eyes: Negative.   Respiratory: Positive for shortness of breath. Negative for cough, hemoptysis, sputum production and wheezing.   Cardiovascular: Negative.   Gastrointestinal: Negative.   Genitourinary: Negative.   Musculoskeletal: Negative.   Skin: Negative.   Neurological: Negative.   Endo/Heme/Allergies: Negative.   Psychiatric/Behavioral: Negative.      Objective:  Physical Exam  Constitutional: He is oriented to person, place, and time. He appears well-developed and well-nourished. No distress.  HENT:  Head: Normocephalic and atraumatic.  Mouth/Throat: Oropharynx is clear and moist.  Eyes: Pupils are equal, round, and reactive to light. Conjunctivae are normal. No scleral icterus.  Neck: Neck supple. No JVD present. No tracheal deviation present.  Cardiovascular: Normal rate, regular rhythm and intact distal pulses.  Murmur (systolic grade 3/6 ) heard. Pulmonary/Chest: Effort normal and breath sounds normal. No accessory muscle usage or stridor. No tachypnea. No respiratory distress. He has no wheezes. He has no rhonchi. He has no rales.  Abdominal: Soft. Bowel sounds are normal. He exhibits no distension. There is no tenderness.  Musculoskeletal: He exhibits edema (BL LE). He exhibits no tenderness.  Lymphadenopathy:    He has no cervical adenopathy.  Neurological: He is alert and oriented to person, place, and time.  Skin: Skin is warm and dry. Capillary refill takes less than 2 seconds. No rash noted.  Psychiatric: He has a normal mood and affect. His behavior is  normal.  Vitals reviewed.   Vitals:   06/27/18 1408  BP: 124/62  Pulse: 99  SpO2: 96%  Weight: 242 lb (109.8 kg)  Height: 6\' 3"  (1.905 m)   96% on RA BMI Readings from Last 3 Encounters:  06/27/18 30.25 kg/m  05/27/18 29.75 kg/m  04/09/18 29.19 kg/m   Wt Readings from Last 3 Encounters:  06/27/18 242 lb (109.8 kg)  05/27/18 238 lb (108 kg)  04/09/18 233 lb 8 oz (105.9 kg)     CBC    Component Value Date/Time   WBC 8.2 03/19/2018 0713   RBC 4.89 03/19/2018 0713   HGB 14.8 03/19/2018 0713   HCT 45.1 03/19/2018 0713   PLT 213 03/19/2018 0713   MCV 92.2 03/19/2018 0713   MCH 30.3 03/19/2018 0713   MCHC 32.8 03/19/2018 0713   RDW 15.2 03/19/2018 0713   LYMPHSABS 1.4 12/05/2016 0649   MONOABS 0.7 12/05/2016 0649   EOSABS 0.1 12/05/2016 0649   BASOSABS 0.0 12/05/2016 0649    Chest Imaging: CT chest 06/27/2018: No significant parenchymal abnormality. The patient's images have been independently reviewed by me.    Pulmonary Functions Testing Results: 04/17/2018: Post bronc dilator response is recorded FVC 4.7 L 98% predicted FEV1 3.68 L 106% predicted Ratio 77 TLC 93% predicted ERV 39% predicted DLCO 51% predicted  FeNO: None   Pathology: None   Echocardiogram: None   Heart Catheterization:  Fick Cardiac Output 6.26 L/min  Fick Cardiac Output Index 2.64 (L/min)/BSA  RA A Wave 12 mmHg  RA V Wave 11 mmHg  RA Mean 8 mmHg  RV Systolic Pressure 29 mmHg  RV Diastolic Pressure 5 mmHg  RV EDP 11 mmHg  PA Systolic Pressure 31 mmHg  PA Diastolic Pressure 17 mmHg  PA Mean 21 mmHg  AO Systolic Pressure 630 mmHg  AO Diastolic Pressure 76 mmHg  AO Mean 160 mmHg  LV Systolic Pressure 109 mmHg  LV Diastolic Pressure 9 mmHg  LV EDP 20 mmHg  AOp Systolic Pressure 323 mmHg  AOp Diastolic Pressure 68 mmHg  AOp Mean Pressure 557 mmHg  LVp Systolic Pressure 322 mmHg  LVp Diastolic Pressure 9 mmHg  LVp EDP Pressure 19 mmHg  QP/QS 1  TPVR Index 7.95 HRUI   TSVR Index 38.98 HRUI  TPVR/TSVR Ratio 0.2   PVR = 2.0 Woods     Assessment & Plan:   SOB (shortness of breath) - Plan: CBC w/Diff, Resp Allergy Profile Regn2DC [place] [facility]  Hiatal hernia  Decreased diffusion capacity  Coronary artery disease involving native coronary artery of native heart without angina pectoris  Cardiomyopathy, unspecified type ([place])  Possible Pulmonary hypertension ([place])  Discussion:  This is an 84 year old gentleman with dyspnea on exertion.  His PFTs show a moderately reduced DLCO and normal lung volumes and normal spirometry.  This pattern is consistent with possible underlying pulmonary vascular disease.  His right heart catheterization reveals a mean pulmonary artery pressure of 21, RA 8, cardiac output 6.9 with a calculated PVR of 2.0 Woods.  These numbers are most consistent with post capillary disease.  There was a recent change in the  pulmonary hypertension guidelines to include patients with a mean pulmonary artery pressure greater than 20 instead of 25.  With his dyspnea on exertion mostly associated with physical activity I wonder if this could be related to exercise-induced PH.  I do believe the patient has many reasons for his dyspnea.  He does have a history of allergies and does believe his symptoms are worse since stopping allergy shots.  He does not give a clear history of wheezing or bronchospasm.  His spirometry is reassuring.  However asthma or EIB would remain on the differential.  Thirdly, he does have a reduced ejection fraction.  And this may be limiting his exertional ability.  Plan:  I believe that he can continue the inhaled steroid to see if it makes any difference in his symptoms.  If he does have bronchial inflammation this will improve the symptoms.  He was given a prescription for an albuterol inhaler to use as needed for chest tightness and shortness of breath.  We will send him for blood work today to look for regional allergy  panel as well as elevated eosinophil count.  This may help point to other reasons for his shortness of breath.  I think maximal fluid management of his underlying chronic heart failure is also important.  I am not sure that additional invasive testing to improve exercise-induced pulmonary hypertension will make a difference in his management at this time.  However I will discuss this with his primary cardiologist.  I believe if desired could consider CPET.  Return to clinic in 6 weeks or if symptoms felt improved.   Current Outpatient Medications:  .  alfuzosin (UROXATRAL) 10 MG 24 hr tablet, Take 1 tablet (10 mg total) by mouth daily with breakfast., Disp: 30 tablet, Rfl: 2 .  ALPRAZolam (XANAX) 0.5 MG tablet, Take 0.5 mg by mouth as needed for anxiety. , Disp: , Rfl:  .  aspirin 81 MG tablet, Take 81 mg by mouth daily.  , Disp: , Rfl:  .  beclomethasone (QVAR) 80 MCG/ACT inhaler, Inhale 1 puff into the lungs 2 (two) times daily., Disp: , Rfl:  .  carvedilol (COREG) 3.125 MG tablet, Take 0.5 tablets (1.5625 mg total) by mouth 2 (two) times daily., Disp: 45 tablet, Rfl: 3 .  clopidogrel (PLAVIX) 75 MG tablet, Take 1 tablet (75 mg total) by mouth daily., Disp: 30 tablet, Rfl: 11 .  Coenzyme Q10 (COQ10) 200 MG CAPS, Take 1 capsule by mouth daily.  , Disp: , Rfl:  .  EPIPEN 2-PAK 0.3 MG/0.3ML SOAJ injection, Inject 1 Dose as directed as needed., Disp: , Rfl: 0 .  ergocalciferol (VITAMIN D2) 50000 UNITS capsule, Take 50,000 Units by mouth 2 (two) times a week. , Disp: , Rfl:  .  Evolocumab (REPATHA) 140 MG/ML SOSY, Inject into the skin every 14 (fourteen) days., Disp: , Rfl:  .  ezetimibe (ZETIA) 10 MG tablet, Take 1 tablet (10 mg total) by mouth daily., Disp: 30 tablet, Rfl: 3 .  finasteride (PROSCAR) 5 MG tablet, Take 5 mg by mouth daily.  , Disp: , Rfl:  .  furosemide (LASIX) 20 MG tablet, Take 1 tablet (20 mg total) by mouth daily., Disp: 90 tablet, Rfl: 3 .  hydrocortisone (CORTEF) 10 MG  tablet, Take 20 mg by mouth 2 (two) times daily., Disp: , Rfl:  .  levothyroxine (SYNTHROID, LEVOTHROID) 150 MCG tablet, Take 150 mcg by mouth daily before breakfast. , Disp: , Rfl:  .  MYRBETRIQ 50 MG TB24  tablet, Take 50 mg by mouth daily., Disp: , Rfl: 0 .  nitroGLYCERIN (NITROSTAT) 0.4 MG SL tablet, Place 1 tablet (0.4 mg total) under the tongue every 5 (five) minutes as needed for chest pain., Disp: 90 tablet, Rfl: 3 .  Omega-3 1000 MG CAPS, Take 1 capsule (1,000 mg total) by mouth 2 (two) times daily., Disp: 60 capsule, Rfl: 11 .  omeprazole (PRILOSEC) 40 MG capsule, Take 40 mg by mouth daily., Disp: , Rfl: 1 .  potassium chloride (K-DUR) 10 MEQ tablet, Take 2 tablets (20 mEq total) by mouth daily., Disp: 180 tablet, Rfl: 3 .  Propylene Glycol (SYSTANE BALANCE OP), Apply 2 drops to eye daily as needed (dry eye)., Disp: , Rfl:  .  Red Yeast Rice Extract 600 MG CAPS, Take 600 mg by mouth daily., Disp: , Rfl:  .  tadalafil (CIALIS) 5 MG tablet, Take 1 tablet (5 mg total) by mouth daily as needed for erectile dysfunction. Do not take nitro within 48 hrs after taking cialis, Disp: 10 tablet, Rfl: 1 .  testosterone cypionate (DEPOTESTOTERONE CYPIONATE) 200 MG/ML injection, Inject into the muscle every 21 ( twenty-one) days. 1.62 % injection, Disp: , Rfl:  .  albuterol (PROVENTIL HFA;VENTOLIN HFA) 108 (90 Base) MCG/ACT inhaler, Inhale 2 puffs into the lungs every 6 (six) hours as needed for wheezing or shortness of breath., Disp: 1 Inhaler, Rfl: 6   Garner Nash, DO [facility] 06/27/2018 3:04 PM

## 2018-06-27 NOTE — Patient Instructions (Addendum)
Thank you for visiting Dr. Valeta Harms at [facility]. Today we recommend the following: Orders Placed This Encounter  Procedures  . CBC w/Diff  . Resp Allergy Profile Regn2DC DE MD [facility]   Meds ordered this encounter  Medications  . albuterol (PROVENTIL HFA;VENTOLIN HFA) 108 (90 Base) MCG/ACT inhaler    Sig: Inhale 2 puffs into the lungs every 6 (six) hours as needed for wheezing or shortness of breath.    Dispense:  1 Inhaler    Refill:  6   Return in about 6 weeks (around 08/08/2018), or if symptoms worsen or fail to improve.

## 2018-06-30 ENCOUNTER — Encounter: Payer: Self-pay | Admitting: Pulmonary Disease

## 2018-06-30 DIAGNOSIS — R7689 Other specified abnormal immunological findings in serum: Secondary | ICD-10-CM | POA: Insufficient documentation

## 2018-06-30 DIAGNOSIS — R768 Other specified abnormal immunological findings in serum: Secondary | ICD-10-CM

## 2018-06-30 DIAGNOSIS — T7849XA Other allergy, initial encounter: Secondary | ICD-10-CM

## 2018-06-30 HISTORY — DX: Other specified abnormal immunological findings in serum: R76.8

## 2018-06-30 HISTORY — DX: Other allergy, initial encounter: T78.49XA

## 2018-06-30 LAB — RESPIRATORY ALLERGY PROFILE REGION II ~~LOC~~
ALLERGEN, CEDAR TREE, T6: 3.97 kU/L — AB
Allergen, Comm Silver Birch, t9: 3.4 kU/L — ABNORMAL HIGH
Allergen, Cottonwood, t14: 0.39 kU/L — ABNORMAL HIGH
Allergen, D pternoyssinus,d7: <0.1 kU/L
Allergen, Mouse Urine Protein, e78: <0.1 kU/L
Allergen, Mulberry, t76: <0.1 kU/L
Allergen, Oak,t7: 1.23 kU/L — ABNORMAL HIGH
Bermuda Grass: <0.1 kU/L
Box Elder IgE: 0.4 kU/L — ABNORMAL HIGH
CLASS: 0
CLASS: 0
CLASS: 0
CLASS: 0
CLASS: 0
CLASS: 0
CLASS: 1
CLASS: 1
CLASS: 1
CLASS: 2
COMMON RAGWEED (SHORT) (W1) IGE: 0.14 kU/L — AB
Cat Dander: 13 kU/L — ABNORMAL HIGH
Class: 0
Class: 0
Class: 0
Class: 0
Class: 0
Class: 0
Class: 1
Class: 1
Class: 2
Class: 2
Class: 2
Class: 3
Class: 3
Class: 3
Cockroach: 0.39 kU/L — ABNORMAL HIGH
DOG DANDER: 5.86 kU/L — AB
Elm IgE: 0.81 kU/L — ABNORMAL HIGH
IgE (Immunoglobulin E), Serum: 215 kU/L — ABNORMAL HIGH (ref <=114)
Johnson Grass: 0.1 kU/L — ABNORMAL HIGH
PECAN/HICKORY TREE IGE: 0.47 kU/L — AB
ROUGH PIGWEED IGE: 0.43 kU/L — AB
Sheep Sorrel IgE: <0.1 kU/L
Timothy Grass: 0.9 kU/L — ABNORMAL HIGH

## 2018-06-30 LAB — INTERPRETATION:

## 2018-07-01 ENCOUNTER — Telehealth: Payer: Self-pay

## 2018-07-01 MED ORDER — MONTELUKAST SODIUM 10 MG PO TABS: 10.0000 mg | ORAL_TABLET | Freq: Every day | ORAL | 11 refills | Status: DC

## 2018-07-01 NOTE — Telephone Encounter (Signed)
Called and spoke to patient. Made aware of results. Voiced understanding. Singulair sent to pharmacy. Nothing further needed at this time.

## 2018-07-02 DIAGNOSIS — E291 Testicular hypofunction: Secondary | ICD-10-CM | POA: Diagnosis not present

## 2018-07-17 ENCOUNTER — Ambulatory Visit (HOSPITAL_COMMUNITY)
Admission: RE | Admit: 2018-07-17 | Discharge: 2018-07-17 | Disposition: A | Discharge status: Home / Self Care | Payer: Medicare Other | Source: Ambulatory Visit | Attending: Cardiology | Admitting: Cardiology

## 2018-07-17 ENCOUNTER — Other Ambulatory Visit: Payer: Self-pay

## 2018-07-17 ENCOUNTER — Encounter (HOSPITAL_COMMUNITY): Payer: Self-pay | Admitting: Cardiology

## 2018-07-17 VITALS — BP 127/72 | HR 52 | Wt 245.2 lb

## 2018-07-17 DIAGNOSIS — I2510 Atherosclerotic heart disease of native coronary artery without angina pectoris: Secondary | ICD-10-CM

## 2018-07-17 DIAGNOSIS — R0600 Dyspnea, unspecified: Secondary | ICD-10-CM | POA: Diagnosis not present

## 2018-07-17 DIAGNOSIS — Z79899 Other long term (current) drug therapy: Secondary | ICD-10-CM | POA: Diagnosis not present

## 2018-07-17 DIAGNOSIS — I252 Old myocardial infarction: Secondary | ICD-10-CM | POA: Diagnosis not present

## 2018-07-17 DIAGNOSIS — Z79890 Hormone replacement therapy (postmenopausal): Secondary | ICD-10-CM | POA: Insufficient documentation

## 2018-07-17 DIAGNOSIS — Z7902 Long term (current) use of antithrombotics/antiplatelets: Secondary | ICD-10-CM | POA: Diagnosis not present

## 2018-07-17 DIAGNOSIS — Z7982 Long term (current) use of aspirin: Secondary | ICD-10-CM | POA: Insufficient documentation

## 2018-07-17 DIAGNOSIS — R0683 Snoring: Secondary | ICD-10-CM | POA: Diagnosis not present

## 2018-07-17 DIAGNOSIS — I429 Cardiomyopathy, unspecified: Secondary | ICD-10-CM | POA: Diagnosis not present

## 2018-07-17 DIAGNOSIS — E785 Hyperlipidemia, unspecified: Secondary | ICD-10-CM | POA: Insufficient documentation

## 2018-07-17 DIAGNOSIS — R0989 Other specified symptoms and signs involving the circulatory and respiratory systems: Secondary | ICD-10-CM | POA: Diagnosis not present

## 2018-07-17 DIAGNOSIS — I5032 Chronic diastolic (congestive) heart failure: Secondary | ICD-10-CM

## 2018-07-17 LAB — BASIC METABOLIC PANEL
ANION GAP: 9 (ref 5–15)
BUN: 21 mg/dL (ref 8–23)
CALCIUM: 9.5 mg/dL (ref 8.9–10.3)
CO2: 25 mmol/L (ref 22–32)
Chloride: 106 mmol/L (ref 98–111)
Creatinine, Ser: 1.2 mg/dL (ref 0.61–1.24)
GFR calc Af Amer: >60 mL/min (ref >60)
GFR, EST NON AFRICAN AMERICAN: 55 mL/min — AB (ref >60)
GLUCOSE: 92 mg/dL (ref 70–99)
Potassium: 4.3 mmol/L (ref 3.5–5.1)
SODIUM: 140 mmol/L (ref 135–145)

## 2018-07-17 LAB — BRAIN NATRIURETIC PEPTIDE: B Natriuretic Peptide: 138.7 pg/mL — ABNORMAL HIGH (ref 0.0–100.0)

## 2018-07-17 NOTE — Patient Instructions (Signed)
Labs today  Your physician has recommended that you have a sleep study. This test records several body functions during sleep, including: brain activity, eye movement, oxygen and carbon dioxide blood levels, heart rate and rhythm, breathing rate and rhythm, the flow of air through your mouth and nose, snoring, body muscle movements, and chest and belly movement.  Your physician recommends that you schedule a follow-up appointment in: 4 months  

## 2018-07-17 NOTE — Progress Notes (Signed)
Patient ID: [name], [name], male   DOB: [birth year], 84 y.o.   MRN: [id number]     Advanced Heart Failure Clinic Note   PCP: [name] HF: [name], [name] is a 84 y.o. male  with history of pituitary adenoma s/p resection, nonobstructive CAD, nonischemic cardiomyopathy, and chronic atrial bigeminy presents for cardiology followup.  Patient has a long history of atrial bigeminy.  He is in this today.  He had had a nonischemic cardiomyopathy with EF around 40% but most recent echo showed normal EF. Historically, he has not been able to tolerate statins, and he has been tried on multiple agents. He is now on Repatha.   In 11/15, he developed chest tightness and was admitted to [facility] with NSTEMI.  LHC showed 60-70% proximal LAD stenosis with totally occluded LCx.  This was opened with DES to the proximal LCx.  Echo post-cath showed EF 55-60%.  In 10/16, he developed typical angina.  LHC showed 95% proximal LAD stenosis that was treated with DES.    He can only tolerate Coreg 3.125 mg 1/2 tab twice a day.  He has had difficulty with BP-active meds in the setting of adrenal insufficiency post pituitary surgery.   Admitted 11/2016 for syncope. Thought to be in setting of adrenal insufficiency complicated by viral gastroenteritis.   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 He has also had PND-like symptoms.   Given worsening symptoms, I started him on low dose Lasix 20 mg daily. Echo in 6/19 showed EF 50-55% with inferolateral hypokinesis.  Cardiolite in 6/19 showed EF 47% with primarily fixed inferolateral defect.  I took him for RHC/LHC.  This showed nonobstructive coronary disease and minimally elevated filling pressures.  PFTs in 7/19 were unremarkable except for decreased DLCO.  Switching from Brilinta to Plavix did not help his  symptoms.  He saw pulmonology and an allergist, he was noted to have a number of environmental allergies, and there was concern that asthma was causing some of his symptoms.  He is now using using Singulair, Flovent, and albuterol.  He says that this has helped "25%."    He seems to be doing better.  No chest pain. He exercises at the gym and walks several times a week.  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  Still short of breath walking fast.  No orthopnea/PND.  More fatigued than in the past.   Labs (5/12): K 4.3, creatinine 1.03 Labs (2/15): K 4, creatinine 1.1, LDL 184 Labs (11/15): K 4.8, creatinine 1.07 Labs (3/16): K 4.4, creatinine 0.9 Labs (4/16): LDL 80, HDL 37 (on Repatha) Labs (5/16): HCT 42.7 Labs (10/16): creatinine 1.05 Labs (11/16): LDL 77, LDL-P 1283 Labs (6/18): LDL 119, HDL 42, K 4.1, creatinine 1.2 Labs (12/18): LDL 70, HDL 45 Labs (6/19): K 4, creatinine 1.23, BNP 128 Labs (8/19): K 4, creatinine 1.5, HDL 41, LDL 75, hgb 14.8 Labs (9/19): K 4.2, creatinine 1.66, hgb 14.5  PMH: 1. Hyperlipidemia: He has been unable to tolerate statins.  2. Chronic atrial bigeminy: x years.  3. Hypothyroidism: s/p surgery for pituitary adenoma.  4. Pituitary adenoma: s/p surgery in 3/11 for removal.  Now on thyroid replacement and hydrocortisone.  5. Cardiomyopathy: Nonischemic.  LHC (2009) with 50% mid LAD, 60% distal LAD.  Left ventriculogram (2009) with EF 40-45%.  Echo (8/13) with EF 35-40%, mild LVH.  Echo (11/14) with EF 40%, diffuse hypokinesis, normal RV.  He was tried in the past on an ACEI but apparently at that time began to have side effects from his pituitary adenoma that were thought initially to be ACEI-related.   He has been off ACEI since that time.  He has had trouble tolerating more that 1/2 tab of 3.125 mg Coreg once a day. Echo (11/15) with EF 55-60%, aortic sclerosis without significant stenosis.  - Echo (3/18): EF 60-65%, no  significant valvular abnormalities.  - Echo (6/19): EF 50-55%, mild LV dilation, inferolateral hypokinesis.  6. CAD: 2009 LHC with 50% mid, 60% distal LAD. NSTEMI 11/15 with 60-70% pLAD, totally occluded LCx with DES to proximal LCx.  EF 55-60% by LV-gram.  Angina with LHC 10/16 showing 95% pLAD, 40% mLAD, 40% dLAD, 70% AM2 => treated with DES to pLAD.  - Cardiolite (6/19): EF 47%, primarily fixed inferolateral defect.  - RHC/LHC (6/19): mean RA 8, PA 31/17, mean PCWP 20, CI 2.64. 40% pLAD, patent proximal LAD stent, 60-70% distal LAD, patent LCx stent.  7. Mild cognitive impairment.  8. Carotid stenosis: Carotid dopplers (1/16) with 40-59% RICA stenosis.  - Carotid dopplers (1/17): Mild plaque only.  9. PFTs (7/19): unremarkable except for decreased DLCO.  10. CT chest (7/19): No significant pulmonary disease.  11. Asthma  SH: Married, semi-retired Clinical biochemist, son = [name], nonsmoker.   FH: Brother with CAD.  Review of systems complete and found to be negative unless listed in HPI.    Current Outpatient Medications  Medication Sig Dispense Refill  . albuterol (PROVENTIL HFA;VENTOLIN HFA) 108 (90 Base) MCG/ACT inhaler Inhale 2 puffs into the lungs every 6 (six) hours as needed for wheezing or shortness of breath. 1 Inhaler 6  . alfuzosin (UROXATRAL) 10 MG 24 hr tablet Take 1 tablet (10 mg total) by mouth daily with breakfast. 30 tablet 2  . ALPRAZolam (XANAX) 0.5 MG tablet Take 0.5 mg by mouth as needed for anxiety.     Marland Kitchen aspirin 81 MG tablet Take 81 mg by mouth daily.      . beclomethasone (QVAR) 80 MCG/ACT inhaler Inhale 1 puff into the lungs 2 (two) times daily.    . carvedilol (COREG) 3.125 MG tablet Take 0.5 tablets (1.5625 mg total) by mouth 2 (two) times daily. 45 tablet 3  . clopidogrel (PLAVIX) 75 MG tablet Take 1 tablet (75 mg total) by mouth daily. 30 tablet 11  . Coenzyme Q10 (COQ10) 200 MG CAPS Take 1 capsule by mouth daily.      Marland Kitchen EPIPEN 2-PAK 0.3 MG/0.3ML SOAJ  injection Inject 1 Dose as directed as needed.  0  . ergocalciferol (VITAMIN D2) 50000 UNITS capsule Take 50,000 Units by mouth 2 (two) times a week.     . Evolocumab (REPATHA) 140 MG/ML SOSY Inject into the skin every 14 (fourteen) days.    Marland Kitchen ezetimibe (ZETIA) 10 MG tablet Take 1 tablet (10 mg total) by mouth daily. 30 tablet 3  . finasteride (PROSCAR) 5 MG tablet Take 5 mg by mouth daily.      . furosemide (LASIX) 20 MG tablet Take 1 tablet (20 mg total) by mouth daily. 90 tablet 3  . hydrocortisone (CORTEF) 10 MG tablet Take 20 mg by mouth 2 (two) times daily.    Marland Kitchen levothyroxine (SYNTHROID, LEVOTHROID) 150 MCG tablet Take 150 mcg by mouth daily before breakfast.     .  montelukast (SINGULAIR) 10 MG tablet Take 1 tablet (10 mg total) by mouth at bedtime. 30 tablet 11  . MYRBETRIQ 50 MG TB24 tablet Take 50 mg by mouth daily.  0  . nitroGLYCERIN (NITROSTAT) 0.4 MG SL tablet Place 1 tablet (0.4 mg total) under the tongue every 5 (five) minutes as needed for chest pain. 90 tablet 3  . Omega-3 1000 MG CAPS Take 1 capsule (1,000 mg total) by mouth 2 (two) times daily. 60 capsule 11  . omeprazole (PRILOSEC) 40 MG capsule Take 40 mg by mouth daily.  1  . potassium chloride (K-DUR) 10 MEQ tablet Take 2 tablets (20 mEq total) by mouth daily. 180 tablet 3  . Propylene Glycol (SYSTANE BALANCE OP) Apply 2 drops to eye daily as needed (dry eye).    . Red Yeast Rice Extract 600 MG CAPS Take 600 mg by mouth daily.    . tadalafil (CIALIS) 5 MG tablet Take 1 tablet (5 mg total) by mouth daily as needed for erectile dysfunction. Do not take nitro within 48 hrs after taking cialis 10 tablet 1  . testosterone cypionate (DEPOTESTOTERONE CYPIONATE) 200 MG/ML injection Inject into the muscle every 21 ( twenty-one) days. 1.62 % injection     No current facility-administered medications for this encounter.     BP 127/72   Pulse (!) 52   Wt 111.2 kg (245 lb 3.2 oz)   SpO2 96%   BMI 30.65 kg/m   General: NAD Neck:  No JVD, no thyromegaly or thyroid nodule.  Lungs: Clear to auscultation bilaterally with normal respiratory effort. CV: Nondisplaced PMI.  Heart regular S1/S2, no S3/S4, no murmur.  1+ ankle edema bilaterally.  No carotid bruit.  Normal pedal pulses.  Abdomen: Soft, nontender, no hepatosplenomegaly, no distention.  Skin: Intact without lesions or rashes.  Neurologic: Alert and oriented x 3.  Psych: Normal affect. Extremities: No clubbing or cyanosis.  HEENT: Normal.   Assessment/Plan: 1. Cardiomyopathy: EF 40% with diffuse hypokinesis on 11/14 echo, but most recent echo showed EF up to 60-65% (3/18).  Echo in 6/19 with EF 50-55%, mildly dilated LV.  [place] in 6/19 was relatively well-compensated with mildly elevated filling pressures.  Volume status looks ok on exam today.  - Continue Coreg at 3.125 1/2 tab bid, he cannot tolerate a higher does due to orthostasis.   - Continue Lasix 20 mg daily (decreased from 40 mg daily with elevated creatinine). BMET today.    2. CAD: s/p NSTEMI with DES to LCx then angina with DES to pLAD.  He has not tolerated multiple statins but is now on Repatha.  LHC was done in 6/19 due to equivocal Cardiolite and ongoing dyspnea, LHC showed no significantly obstructive disease.  - Will continue ASA 81 and Plavix 75 mg daily.  - Continue current Coreg.  - Continue Repatha.  3. Hyperlipidemia: He has not tolerated multiple statins. Has previously failed Crestor 5 mg two times/week due to myalgias.   - Continue Repatha, good lipids on this med.  4. Carotid bruit: 10/2016 dopplers with minimal stenosis. Unchanged from previous.  5. Exertional dyspnea: Persistent.  Did not help to switch from Brilinta to Plavix.  He has not been anemic.  Hypothyroidism and adrenal insufficiency followed closely by PCP and seem to be stable. PFTs and CT chest were unremarkable.  Volume status looks ok on Lasix 20 mg daily.  Asthma may play a role, as described above.   - Continue Singulair and  inhalers per pulmonary.  -  Continue regular exercise.  - Continue current Lasix.   - I will have him get a sleep study as OSA may contribute to his symptoms.     Followup in 4 months.   [name] 07/17/2018

## 2018-07-23 DIAGNOSIS — E291 Testicular hypofunction: Secondary | ICD-10-CM | POA: Diagnosis not present

## 2018-07-29 ENCOUNTER — Other Ambulatory Visit (HOSPITAL_COMMUNITY): Payer: Self-pay

## 2018-07-29 MED ORDER — EZETIMIBE 10 MG PO TABS: 10.0000 mg | ORAL_TABLET | Freq: Every day | ORAL | 3 refills | Status: DC

## 2018-08-11 ENCOUNTER — Ambulatory Visit: Payer: Medicare Other | Admitting: Neurology

## 2018-08-21 ENCOUNTER — Ambulatory Visit (HOSPITAL_BASED_OUTPATIENT_CLINIC_OR_DEPARTMENT_OTHER)
Admission: RE | Admit: 2018-08-21 | Discharge: 2018-08-21 | Disposition: A | Discharge status: Home / Self Care | Payer: Medicare Other | Source: Ambulatory Visit | Attending: Cardiovascular Disease | Admitting: Cardiovascular Disease

## 2018-08-21 ENCOUNTER — Encounter (HOSPITAL_COMMUNITY): Payer: Self-pay

## 2018-08-21 ENCOUNTER — Other Ambulatory Visit: Payer: Self-pay

## 2018-08-21 ENCOUNTER — Telehealth: Payer: Self-pay | Admitting: Pulmonary Disease

## 2018-08-21 ENCOUNTER — Encounter: Payer: Self-pay | Admitting: Pulmonary Disease

## 2018-08-21 ENCOUNTER — Emergency Department (HOSPITAL_COMMUNITY): Payer: Medicare Other

## 2018-08-21 ENCOUNTER — Ambulatory Visit (INDEPENDENT_AMBULATORY_CARE_PROVIDER_SITE_OTHER): Payer: Medicare Other | Admitting: Pulmonary Disease

## 2018-08-21 ENCOUNTER — Observation Stay (HOSPITAL_COMMUNITY)
Admission: EM | Admit: 2018-08-21 | Discharge: 2018-08-22 | Disposition: A | Discharge status: Home / Self Care | Payer: Medicare Other | Attending: Internal Medicine | Admitting: Internal Medicine

## 2018-08-21 VITALS — BP 110/70 | HR 48 | Ht 75.0 in | Wt 241.8 lb

## 2018-08-21 DIAGNOSIS — E7800 Pure hypercholesterolemia, unspecified: Secondary | ICD-10-CM | POA: Insufficient documentation

## 2018-08-21 DIAGNOSIS — D649 Anemia, unspecified: Secondary | ICD-10-CM

## 2018-08-21 DIAGNOSIS — Z7951 Long term (current) use of inhaled steroids: Secondary | ICD-10-CM | POA: Insufficient documentation

## 2018-08-21 DIAGNOSIS — Z86018 Personal history of other benign neoplasm: Secondary | ICD-10-CM | POA: Diagnosis not present

## 2018-08-21 DIAGNOSIS — Z79899 Other long term (current) drug therapy: Secondary | ICD-10-CM | POA: Diagnosis not present

## 2018-08-21 DIAGNOSIS — N183 Chronic kidney disease, stage 3 unspecified: Secondary | ICD-10-CM

## 2018-08-21 DIAGNOSIS — R600 Localized edema: Secondary | ICD-10-CM | POA: Insufficient documentation

## 2018-08-21 DIAGNOSIS — I10 Essential (primary) hypertension: Secondary | ICD-10-CM

## 2018-08-21 DIAGNOSIS — J45909 Unspecified asthma, uncomplicated: Secondary | ICD-10-CM

## 2018-08-21 DIAGNOSIS — I8290 Acute embolism and thrombosis of unspecified vein: Secondary | ICD-10-CM | POA: Diagnosis not present

## 2018-08-21 DIAGNOSIS — N400 Enlarged prostate without lower urinary tract symptoms: Secondary | ICD-10-CM | POA: Diagnosis not present

## 2018-08-21 DIAGNOSIS — R768 Other specified abnormal immunological findings in serum: Secondary | ICD-10-CM | POA: Diagnosis not present

## 2018-08-21 DIAGNOSIS — I824Z1 Acute embolism and thrombosis of unspecified deep veins of right distal lower extremity: Secondary | ICD-10-CM | POA: Insufficient documentation

## 2018-08-21 DIAGNOSIS — R0602 Shortness of breath: Secondary | ICD-10-CM | POA: Insufficient documentation

## 2018-08-21 DIAGNOSIS — I252 Old myocardial infarction: Secondary | ICD-10-CM | POA: Insufficient documentation

## 2018-08-21 DIAGNOSIS — I129 Hypertensive chronic kidney disease with stage 1 through stage 4 chronic kidney disease, or unspecified chronic kidney disease: Secondary | ICD-10-CM | POA: Diagnosis not present

## 2018-08-21 DIAGNOSIS — I2510 Atherosclerotic heart disease of native coronary artery without angina pectoris: Secondary | ICD-10-CM | POA: Diagnosis not present

## 2018-08-21 DIAGNOSIS — Z955 Presence of coronary angioplasty implant and graft: Secondary | ICD-10-CM | POA: Diagnosis not present

## 2018-08-21 DIAGNOSIS — E785 Hyperlipidemia, unspecified: Secondary | ICD-10-CM | POA: Diagnosis not present

## 2018-08-21 DIAGNOSIS — F419 Anxiety disorder, unspecified: Secondary | ICD-10-CM | POA: Diagnosis not present

## 2018-08-21 DIAGNOSIS — J4520 Mild intermittent asthma, uncomplicated: Secondary | ICD-10-CM | POA: Diagnosis not present

## 2018-08-21 DIAGNOSIS — I429 Cardiomyopathy, unspecified: Secondary | ICD-10-CM | POA: Diagnosis not present

## 2018-08-21 DIAGNOSIS — I2699 Other pulmonary embolism without acute cor pulmonale: Principal | ICD-10-CM

## 2018-08-21 DIAGNOSIS — Z8249 Family history of ischemic heart disease and other diseases of the circulatory system: Secondary | ICD-10-CM | POA: Insufficient documentation

## 2018-08-21 DIAGNOSIS — T7849XA Other allergy, initial encounter: Secondary | ICD-10-CM

## 2018-08-21 DIAGNOSIS — I82401 Acute embolism and thrombosis of unspecified deep veins of right lower extremity: Secondary | ICD-10-CM | POA: Diagnosis not present

## 2018-08-21 DIAGNOSIS — Z85828 Personal history of other malignant neoplasm of skin: Secondary | ICD-10-CM | POA: Insufficient documentation

## 2018-08-21 DIAGNOSIS — D352 Benign neoplasm of pituitary gland: Secondary | ICD-10-CM

## 2018-08-21 DIAGNOSIS — R942 Abnormal results of pulmonary function studies: Secondary | ICD-10-CM | POA: Diagnosis not present

## 2018-08-21 DIAGNOSIS — Z79890 Hormone replacement therapy (postmenopausal): Secondary | ICD-10-CM | POA: Diagnosis not present

## 2018-08-21 DIAGNOSIS — Z7982 Long term (current) use of aspirin: Secondary | ICD-10-CM | POA: Insufficient documentation

## 2018-08-21 DIAGNOSIS — D631 Anemia in chronic kidney disease: Secondary | ICD-10-CM | POA: Insufficient documentation

## 2018-08-21 DIAGNOSIS — I82409 Acute embolism and thrombosis of unspecified deep veins of unspecified lower extremity: Secondary | ICD-10-CM

## 2018-08-21 DIAGNOSIS — M7989 Other specified soft tissue disorders: Secondary | ICD-10-CM | POA: Diagnosis present

## 2018-08-21 DIAGNOSIS — Z7902 Long term (current) use of antithrombotics/antiplatelets: Secondary | ICD-10-CM | POA: Diagnosis not present

## 2018-08-21 DIAGNOSIS — I272 Pulmonary hypertension, unspecified: Secondary | ICD-10-CM | POA: Diagnosis not present

## 2018-08-21 DIAGNOSIS — E559 Vitamin D deficiency, unspecified: Secondary | ICD-10-CM | POA: Diagnosis not present

## 2018-08-21 LAB — CBC WITH DIFFERENTIAL/PLATELET
Abs Immature Granulocytes: 0.03 10*3/uL (ref 0.00–0.07)
BASOS ABS: 0.1 10*3/uL (ref 0.0–0.1)
BASOS PCT: 1 %
EOS ABS: 0.3 10*3/uL (ref 0.0–0.5)
Eosinophils Relative: 3 %
HCT: 40.4 % (ref 39.0–52.0)
Hemoglobin: 12.8 g/dL — ABNORMAL LOW (ref 13.0–17.0)
IMMATURE GRANULOCYTES: 0 %
LYMPHS ABS: 1.7 10*3/uL (ref 0.7–4.0)
Lymphocytes Relative: 18 %
MCH: 30.3 pg (ref 26.0–34.0)
MCHC: 31.7 g/dL (ref 30.0–36.0)
MCV: 95.7 fL (ref 80.0–100.0)
Monocytes Absolute: 0.7 10*3/uL (ref 0.1–1.0)
Monocytes Relative: 7 %
NEUTROS PCT: 71 %
NRBC: 0 % (ref 0.0–0.2)
Neutro Abs: 6.6 10*3/uL (ref 1.7–7.7)
PLATELETS: 274 10*3/uL (ref 150–400)
RBC: 4.22 MIL/uL (ref 4.22–5.81)
RDW: 13.9 % (ref 11.5–15.5)
WBC: 9.3 10*3/uL (ref 4.0–10.5)

## 2018-08-21 LAB — BASIC METABOLIC PANEL
ANION GAP: 8 (ref 5–15)
BUN: 27 mg/dL — ABNORMAL HIGH (ref 8–23)
CALCIUM: 9.3 mg/dL (ref 8.9–10.3)
CO2: 26 mmol/L (ref 22–32)
Chloride: 107 mmol/L (ref 98–111)
Creatinine, Ser: 1.53 mg/dL — ABNORMAL HIGH (ref 0.61–1.24)
GFR calc non Af Amer: 41 mL/min — ABNORMAL LOW (ref >60)
GFR, EST AFRICAN AMERICAN: 47 mL/min — AB (ref >60)
Glucose, Bld: 93 mg/dL (ref 70–99)
Potassium: 4 mmol/L (ref 3.5–5.1)
SODIUM: 141 mmol/L (ref 135–145)

## 2018-08-21 LAB — APTT: aPTT: 28 seconds (ref 24–36)

## 2018-08-21 LAB — PROTIME-INR
INR: 1.15
Prothrombin Time: 14.6 seconds (ref 11.4–15.2)

## 2018-08-21 MED ORDER — COQ10 200 MG PO CAPS: 200.0000 mg | ORAL_CAPSULE | Freq: Every day | ORAL | Status: DC

## 2018-08-21 MED ORDER — ENOXAPARIN SODIUM 120 MG/0.8ML ~~LOC~~ SOLN
1.0000 mg/kg | Freq: Two times a day (BID) | SUBCUTANEOUS | Status: DC
  Administered 2018-08-22 (×2): 110 mg via SUBCUTANEOUS
  Filled 2018-08-21 (×2): qty 0.73

## 2018-08-21 MED ORDER — IOPAMIDOL (ISOVUE-370) INJECTION 76%
INTRAVENOUS | Status: AC
  Filled 2018-08-21: qty 100

## 2018-08-21 MED ORDER — IOPAMIDOL (ISOVUE-370) INJECTION 76%
100.0000 mL | Freq: Once | INTRAVENOUS | Status: AC | PRN
  Administered 2018-08-21: 75 mL via INTRAVENOUS

## 2018-08-21 MED ORDER — FINASTERIDE 5 MG PO TABS
2.5000 mg | ORAL_TABLET | Freq: Every day | ORAL | Status: DC
  Administered 2018-08-22: 2.5 mg via ORAL
  Filled 2018-08-21: qty 0.5

## 2018-08-21 MED ORDER — MIRABEGRON ER 50 MG PO TB24
50.0000 mg | ORAL_TABLET | Freq: Every day | ORAL | Status: DC
  Administered 2018-08-22: 50 mg via ORAL
  Filled 2018-08-21: qty 1

## 2018-08-21 MED ORDER — CARVEDILOL 3.125 MG PO TABS
1.5625 mg | ORAL_TABLET | Freq: Two times a day (BID) | ORAL | Status: DC
  Administered 2018-08-22 (×2): 1.5625 mg via ORAL
  Filled 2018-08-21 (×2): qty 1

## 2018-08-21 MED ORDER — FLUTICASONE FUROATE-VILANTEROL 200-25 MCG/INH IN AEPB
1.0000 | INHALATION_SPRAY | Freq: Every day | RESPIRATORY_TRACT | Status: DC
  Administered 2018-08-22: 1 via RESPIRATORY_TRACT
  Filled 2018-08-21: qty 28

## 2018-08-21 MED ORDER — ALBUTEROL SULFATE (2.5 MG/3ML) 0.083% IN NEBU: 3.0000 mL | INHALATION_SOLUTION | Freq: Four times a day (QID) | RESPIRATORY_TRACT | Status: DC | PRN

## 2018-08-21 MED ORDER — HYDROCORTISONE 20 MG PO TABS
20.0000 mg | ORAL_TABLET | Freq: Two times a day (BID) | ORAL | Status: DC
  Administered 2018-08-22 (×2): 20 mg via ORAL
  Filled 2018-08-21 (×4): qty 1

## 2018-08-21 MED ORDER — ACETAMINOPHEN 650 MG RE SUPP: 650.0000 mg | Freq: Four times a day (QID) | RECTAL | Status: DC | PRN

## 2018-08-21 MED ORDER — RED YEAST RICE EXTRACT 600 MG PO CAPS: 600.0000 mg | ORAL_CAPSULE | Freq: Every day | ORAL | Status: DC

## 2018-08-21 MED ORDER — MONTELUKAST SODIUM 10 MG PO TABS
10.0000 mg | ORAL_TABLET | Freq: Every day | ORAL | Status: DC
  Administered 2018-08-22: 10 mg via ORAL
  Filled 2018-08-21: qty 1

## 2018-08-21 MED ORDER — ALFUZOSIN HCL ER 10 MG PO TB24
10.0000 mg | ORAL_TABLET | Freq: Every day | ORAL | Status: DC
  Administered 2018-08-22: 10 mg via ORAL
  Filled 2018-08-21: qty 1

## 2018-08-21 MED ORDER — LEVOTHYROXINE SODIUM 75 MCG PO TABS
150.0000 ug | ORAL_TABLET | Freq: Every day | ORAL | Status: DC
  Administered 2018-08-22: 150 ug via ORAL
  Filled 2018-08-21: qty 2
  Filled 2018-08-21: qty 1
  Filled 2018-08-21: qty 2

## 2018-08-21 MED ORDER — OMEGA-3-ACID ETHYL ESTERS 1 G PO CAPS
1000.0000 mg | ORAL_CAPSULE | Freq: Two times a day (BID) | ORAL | Status: DC
  Administered 2018-08-22 (×2): 1000 mg via ORAL
  Filled 2018-08-21 (×2): qty 1

## 2018-08-21 MED ORDER — FLUTICASONE FUROATE-VILANTEROL 200-25 MCG/INH IN AEPB: 1.0000 | INHALATION_SPRAY | Freq: Every day | RESPIRATORY_TRACT | 0 refills | Status: DC

## 2018-08-21 MED ORDER — ALPRAZOLAM 0.25 MG PO TABS
0.5000 mg | ORAL_TABLET | Freq: Every day | ORAL | Status: DC | PRN
  Administered 2018-08-22: 0.5 mg via ORAL
  Filled 2018-08-21: qty 2

## 2018-08-21 MED ORDER — FEXOFENADINE HCL 180 MG PO TABS: 180.0000 mg | ORAL_TABLET | Freq: Every day | ORAL | Status: DC

## 2018-08-21 MED ORDER — ACETAMINOPHEN 325 MG PO TABS
650.0000 mg | ORAL_TABLET | Freq: Four times a day (QID) | ORAL | Status: DC | PRN
  Filled 2018-08-21: qty 2

## 2018-08-21 MED ORDER — EZETIMIBE 10 MG PO TABS
10.0000 mg | ORAL_TABLET | Freq: Every day | ORAL | Status: DC
  Administered 2018-08-22: 10 mg via ORAL
  Filled 2018-08-21: qty 1

## 2018-08-21 MED ORDER — CLOPIDOGREL BISULFATE 75 MG PO TABS
75.0000 mg | ORAL_TABLET | Freq: Every day | ORAL | Status: DC
  Administered 2018-08-22: 75 mg via ORAL
  Filled 2018-08-21: qty 1

## 2018-08-21 MED ORDER — FLUTICASONE PROPIONATE 50 MCG/ACT NA SUSP: 2.0000 | Freq: Every day | NASAL | 2 refills | Status: DC

## 2018-08-21 MED ORDER — LORATADINE 10 MG PO TABS
10.0000 mg | ORAL_TABLET | Freq: Every day | ORAL | Status: DC
  Administered 2018-08-22: 10 mg via ORAL
  Filled 2018-08-21: qty 1

## 2018-08-21 MED ORDER — VITAMIN D (ERGOCALCIFEROL) 1.25 MG (50000 UNIT) PO CAPS: 50000.0000 [IU] | ORAL_CAPSULE | ORAL | Status: DC

## 2018-08-21 NOTE — Patient Instructions (Addendum)
Thank you for visiting Dr. Valeta Harms at [facility]. Today we recommend the following:   Meds ordered this encounter  Medications  . fluticasone furoate-vilanterol (BREO ELLIPTA) 200-25 MCG/INH AEPB    Sig: Inhale 1 puff into the lungs daily.    Dispense:  1 each    Refill:  0    Order Specific Question:   Lot Number?    Answer:   [id number]    Order Specific Question:   Expiration Date?    Answer:   10/22/2019    Order Specific Question:   Manufacturer?    Answer:   [employer] [[id number]]    Order Specific Question:   Quantity    Answer:   1  . fluticasone (FLONASE) 50 MCG/ACT nasal spray    Sig: Place 2 sprays into both nostrils daily.    Dispense:  16 g    Refill:  2  . fexofenadine (ALLEGRA ALLERGY) 180 MG tablet    Sig: Take 1 tablet (180 mg total) by mouth daily.   Return in about 6 weeks (around 10/02/2018), or if symptoms worsen or fail to improve.

## 2018-08-21 NOTE — Progress Notes (Signed)
Synopsis: Referred in September 2019 for shortness of breath by [name], MD  Subjective:   PATIENT ID: [name], [name] GENDER: male DOB: 06-05-1934, MRN: [id number]  Chief Complaint  Patient presents with  . Follow-up    states he uses the albuterol inhaler everyday, cant tell if it is helping.     PMH of cardiomyopathy, MI, stent placement, CAD, HLD, follows with Dr. Aundra Dubin. 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 Past 6 months worsening SOB. 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 He goes to the gym every day for an 1 hour. He never wheezes or has chest tightness. He feels like he runs out of breath. He was on allergy shots for a long time. He associates some of this of breath starting around the time he quit allergy shots back in June..  Since shortness of breath with exertion.  He is able to go to the gym and walk for nearly 30 minutes without stopping.  He is able to get his heart rate up into the low 100s with exercise.  Patient denies chest pain, nausea vomiting, cough with sputum production.  He does have nocturnal dyspnea.  He wakes up in the middle of the night feeling short of breath and will have to sit in the den or in his office upright for a few hours before he feels comfortable enough to lay flat again.  OV 08/21/2018: Positive RAST, positive IgE 215, our lab results returned from last office visit.  Since he was last seen he does feel as if his dyspnea on exertion may be a little bit better and his nocturnal symptoms are little better.  He still getting up in the middle of the night feeling short of breath.  He has not been having any episodes of wheezing.  He does notice that his posterior nasal drip symptoms may be slightly worse in the morning.  He still continually have to clear his sinus passages and throat in the morning.  Feel as if his hoarseness of voice is  slightly worse.  He has not been rinsing out his mouth after using his inhaler.  2 weeks ago at a football game at [facility] he fell down the stairs and landed on his right leg.  Since then the leg has been progressively swollen.  At this point he complains of calf pain with exertion.  The swelling is getting larger.  At this point his right leg is two thirds the size of his left leg.  He does have increased swelling in the legs as compared to his baseline.   Past Medical History:  Diagnosis Date  . Atrial premature beats   . BPH (benign prostatic hyperplasia)   . BPH (benign prostatic hypertrophy)   . Cataract, right eye   . Chronic low back pain   . Colon polyp   . Coronary artery disease   . DDD (degenerative disc disease), lumbar   . Dermatitis 11/16  . Elevated IgE level 06/30/2018  . Hyperlipidemia   . Insomnia   . Kidney cysts   . Memory loss 2015  . Non-ischemic cardiomyopathy ([place])   . NSTEMI (non-ST elevated myocardial infarction) ([place]) 08/25/2014  . Osteoarthritis of right knee   . Pituitary tumor 2011   macroadenoma w/ VF compromise-then gamma knife a few years leater for some recurrence  . Positive radioallergosorbent test (RAST) 06/30/2018  .  Status post insertion of drug-eluting stent into left anterior descending (LAD) artery 08/28/15   + nuc study.   . Urinary retention   . Vitamin D deficiency 01/2008     Family History  Problem Relation Age of Onset  . Congestive Heart Failure Father 25  . Other Mother 35  . Coronary artery disease Brother   . Other Sister 3       polio  . Heart attack Brother        MI/ASCAD 2006  . COPD Sister 7     Past Surgical History:  Procedure Laterality Date  . ARTHROSCOPIC SURGERY    . BACK SURGERY    . CARDIAC CATHETERIZATION  07/27/2015   Dr Aundra Dubin; oLAD 95%, mLAD 50%, CFX stent OK, OM1 60%, OM2 99%, pPLOM 50%, RCA irregular, AM2 70%, EF 55-60%, no regional wall motion abnormalities.     Marland Kitchen CARDIAC CATHETERIZATION N/A 07/28/2015    Procedure: Left Heart Cath and Coronary Angiography;  Surgeon: [name], MD;  Location: [facility];  Service: Cardiovascular;  Laterality: N/A;  . CARDIAC CATHETERIZATION N/A 07/28/2015   Procedure: Coronary Stent Intervention;  Surgeon: Troy Sine, MD;  Location: [facility];  Service: Cardiovascular;  Laterality: N/A;  . CATARACT EXTRACTION    . CORONARY ANGIOPLASTY WITH STENT PLACEMENT  07/27/2015   XIENCE ALPINE RX 3.0X33 DES to the LAD, 95%>>0  . HAMMER  BUNION TOE SURGERY    . HAND SURGERY Left   . LEFT HEART CATHETERIZATION WITH CORONARY ANGIOGRAM N/A 08/25/2014   Procedure: LEFT HEART CATHETERIZATION WITH CORONARY ANGIOGRAM;  Surgeon: Burnell Blanks, MD;  Location: [facility];  Service: Cardiovascular;  Laterality: N/A;  . PITUITARY SURGERY     12/01/09 Dr. Wilburn Cornelia and [name].  Marland Kitchen RIGHT/LEFT HEART CATH AND CORONARY ANGIOGRAPHY N/A 03/19/2018   Procedure: RIGHT/LEFT HEART CATH AND CORONARY ANGIOGRAPHY;  Surgeon: [name], MD;  Location: [facility];  Service: Cardiovascular;  Laterality: N/A;    Social History   Socioeconomic History  . Marital status: Married    Spouse name: [name]  . Number of children: 2  . Years of education: College  . Highest education level: Not on file  Occupational History  . Occupation: retired Programmer, multimedia: RETIRED  Social Needs  . Financial resource strain: Not on file  . Food insecurity:    Worry: Not on file    Inability: Not on file  . Transportation needs:    Medical: Not on file    Non-medical: Not on file  Tobacco Use  . Smoking status: Never Smoker  . Smokeless tobacco: Never Used  Substance and Sexual Activity  . Alcohol use: Yes    Comment: occas.  . Drug use: No  . Sexual activity: Not on file  Lifestyle  . Physical activity:    Days per week: Not on file    Minutes per session: Not on file  . Stress: Not on file  Relationships  . Social connections:     Talks on phone: Not on file    Gets together: Not on file    Attends religious service: Not on file    Active member of club or organization: Not on file    Attends meetings of clubs or organizations: Not on file    Relationship status: Not on file  . Intimate partner violence:    Fear of current or ex partner: Not on file  Emotionally abused: Not on file    Physically abused: Not on file    Forced sexual activity: Not on file  Other Topics Concern  . Not on file  Social History Narrative   Patient is married [name]).   Patient has two children.   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   Patient is right-handed.   Patient does not drink any caffeine.     Allergies  Allergen Reactions  . Sulfamethoxazole Anaphylaxis and Swelling  . Bee Venom Rash  . Sulfa Antibiotics Nausea And Vomiting  . Sulfonamide Derivatives Nausea And Vomiting  . Tamsulosin     Unknown pt doesn't think he is allergic      Outpatient Medications Prior to Visit  Medication Sig Dispense Refill  . albuterol (PROVENTIL HFA;VENTOLIN HFA) 108 (90 Base) MCG/ACT inhaler Inhale 2 puffs into the lungs every 6 (six) hours as needed for wheezing or shortness of breath. 1 Inhaler 6  . alfuzosin (UROXATRAL) 10 MG 24 hr tablet Take 1 tablet (10 mg total) by mouth daily with breakfast. 30 tablet 2  . ALPRAZolam (XANAX) 0.5 MG tablet Take 0.5 mg by mouth as needed for anxiety.     Marland Kitchen aspirin 81 MG tablet Take 81 mg by mouth daily.      . beclomethasone (QVAR) 80 MCG/ACT inhaler Inhale 1 puff into the lungs 2 (two) times daily.    . carvedilol (COREG) 3.125 MG tablet Take 0.5 tablets (1.5625 mg total) by mouth 2 (two) times daily. 45 tablet 3  . clopidogrel (PLAVIX) 75 MG tablet Take 1 tablet (75 mg total) by mouth daily. 30 tablet 11  . Coenzyme Q10 (COQ10) 200 MG CAPS Take 1 capsule by mouth daily.      Marland Kitchen EPIPEN 2-PAK 0.3 MG/0.3ML SOAJ injection Inject 1 Dose as directed as needed.  0  .  ergocalciferol (VITAMIN D2) 50000 UNITS capsule Take 50,000 Units by mouth 2 (two) times a week.     . Evolocumab (REPATHA) 140 MG/ML SOSY Inject into the skin every 14 (fourteen) days.    Marland Kitchen ezetimibe (ZETIA) 10 MG tablet Take 1 tablet (10 mg total) by mouth daily. 30 tablet 3  . finasteride (PROSCAR) 5 MG tablet Take 5 mg by mouth daily.      . furosemide (LASIX) 20 MG tablet Take 1 tablet (20 mg total) by mouth daily. 90 tablet 3  . hydrocortisone (CORTEF) 10 MG tablet Take 20 mg by mouth 2 (two) times daily.    Marland Kitchen levothyroxine (SYNTHROID, LEVOTHROID) 150 MCG tablet Take 150 mcg by mouth daily before breakfast.     . montelukast (SINGULAIR) 10 MG tablet Take 1 tablet (10 mg total) by mouth at bedtime. 30 tablet 11  . MYRBETRIQ 50 MG TB24 tablet Take 50 mg by mouth daily.  0  . nitroGLYCERIN (NITROSTAT) 0.4 MG SL tablet Place 1 tablet (0.4 mg total) under the tongue every 5 (five) minutes as needed for chest pain. 90 tablet 3  . Omega-3 1000 MG CAPS Take 1 capsule (1,000 mg total) by mouth 2 (two) times daily. 60 capsule 11  . omeprazole (PRILOSEC) 40 MG capsule Take 40 mg by mouth daily.  1  . potassium chloride (K-DUR) 10 MEQ tablet Take 2 tablets (20 mEq total) by mouth daily. 180 tablet 3  . Propylene Glycol (SYSTANE BALANCE OP) Apply 2 drops to eye daily as needed (dry eye).    . Red Yeast Rice Extract 600 MG CAPS Take  600 mg by mouth daily.    . tadalafil (CIALIS) 5 MG tablet Take 1 tablet (5 mg total) by mouth daily as needed for erectile dysfunction. Do not take nitro within 48 hrs after taking cialis 10 tablet 1  . testosterone cypionate (DEPOTESTOTERONE CYPIONATE) 200 MG/ML injection Inject into the muscle every 21 ( twenty-one) days. 1.62 % injection     No facility-administered medications prior to visit.     Review of Systems  Constitutional: Negative for chills, fever, malaise/fatigue and weight loss.  HENT: Negative for hearing loss, sore throat and tinnitus.   Eyes: Negative  for blurred vision and double vision.  Respiratory: Positive for cough and shortness of breath. Negative for hemoptysis, sputum production, wheezing and stridor.   Cardiovascular: Positive for orthopnea and leg swelling. Negative for chest pain, palpitations and PND.  Gastrointestinal: Negative for abdominal pain, constipation, diarrhea, heartburn, nausea and vomiting.  Genitourinary: Negative for dysuria, hematuria and urgency.  Musculoskeletal: Negative for joint pain and myalgias.  Skin: Negative for itching and rash.  Neurological: Negative for dizziness, tingling, weakness and headaches.  Endo/Heme/Allergies: Negative for environmental allergies. Does not bruise/bleed easily.  Psychiatric/Behavioral: Negative for depression. The patient is not nervous/anxious and does not have insomnia.   All other systems reviewed and are negative.    Objective:  Physical Exam  Constitutional: He is oriented to person, place, and time. He appears well-developed and well-nourished. No distress.  HENT:  Head: Normocephalic and atraumatic.  Mouth/Throat: Oropharyngeal exudate present.  Redness in the bilateral naris.  Evidence of posterior nasal drip within the oropharynx  Eyes: Pupils are equal, round, and reactive to light. Conjunctivae are normal. No scleral icterus.  Neck: Neck supple. No JVD present. No tracheal deviation present.  Cardiovascular: Regular rhythm.  Murmur heard. Bradycardic  Pulmonary/Chest: Effort normal and breath sounds normal. No accessory muscle usage or stridor. No tachypnea. No respiratory distress. He has no wheezes. He has no rhonchi. He has no rales.  Abdominal: Soft. Bowel sounds are normal. He exhibits no distension. There is no tenderness.  Musculoskeletal: He exhibits edema. He exhibits no tenderness.  Significant right calf edema as compared to the left.  Both has lower extremity edema present bilaterally, pitting 2+.  The right calf is warm in comparison to the  left and the edema extends all the way into the popliteal fossa.  Lymphadenopathy:    He has no cervical adenopathy.  Neurological: He is alert and oriented to person, place, and time.  Skin: Skin is warm and dry. Capillary refill takes less than 2 seconds. No rash noted.  Psychiatric: He has a normal mood and affect. His behavior is normal.  Vitals reviewed.   Vitals:   08/21/18 1007  BP: 110/70  Pulse: (!) 48  SpO2: 97%  Weight: 241 lb 12.8 oz (109.7 kg)  Height: 6\' 3"  (1.905 m)   97% on RA BMI Readings from Last 3 Encounters:  08/21/18 30.22 kg/m  07/17/18 30.65 kg/m  06/27/18 30.25 kg/m   Wt Readings from Last 3 Encounters:  08/21/18 241 lb 12.8 oz (109.7 kg)  07/17/18 245 lb 3.2 oz (111.2 kg)  06/27/18 242 lb (109.8 kg)     CBC    Component Value Date/Time   WBC 8.0 06/27/2018 1513   RBC 4.67 06/27/2018 1513   HGB 14.5 06/27/2018 1513   HCT 42.8 06/27/2018 1513   PLT 229.0 06/27/2018 1513   MCV 91.7 06/27/2018 1513   MCH 30.3 03/19/2018 0713   MCHC  33.8 06/27/2018 1513   RDW 15.0 06/27/2018 1513   LYMPHSABS 1.7 06/27/2018 1513   MONOABS 0.6 06/27/2018 1513   EOSABS 0.3 06/27/2018 1513   BASOSABS 0.2 (H) 06/27/2018 1513    Regional allergy panel: Positive for multiple items to include cat dander, dog, cockroach, multiple trees. IGE 215  Chest Imaging: CT chest 06/27/2018: No significant parenchymal abnormality. The patient's images have been independently reviewed by me.    Pulmonary Functions Testing Results: 04/17/2018: Post bronc dilator response is recorded FVC 4.7 L 98% predicted FEV1 3.68 L 106% predicted Ratio 77 TLC 93% predicted ERV 39% predicted DLCO 51% predicted  FeNO: None   Pathology: None   Echocardiogram: None   Heart Catheterization:  Fick Cardiac Output 6.26 L/min  Fick Cardiac Output Index 2.64 (L/min)/BSA  RA A Wave 12 mmHg  RA V Wave 11 mmHg  RA Mean 8 mmHg  RV Systolic Pressure 29 mmHg  RV Diastolic Pressure 5  mmHg  RV EDP 11 mmHg  PA Systolic Pressure 31 mmHg  PA Diastolic Pressure 17 mmHg  PA Mean 21 mmHg  AO Systolic Pressure 809 mmHg  AO Diastolic Pressure 76 mmHg  AO Mean 983 mmHg  LV Systolic Pressure 382 mmHg  LV Diastolic Pressure 9 mmHg  LV EDP 20 mmHg  AOp Systolic Pressure 505 mmHg  AOp Diastolic Pressure 68 mmHg  AOp Mean Pressure 397 mmHg  LVp Systolic Pressure 673 mmHg  LVp Diastolic Pressure 9 mmHg  LVp EDP Pressure 19 mmHg  QP/QS 1  TPVR Index 7.95 HRUI  TSVR Index 38.98 HRUI  TPVR/TSVR Ratio 0.2   PVR = 2.0 [place]     Assessment & Plan:   SOB (shortness of breath) - Plan: VAS [place] LOWER EXTREMITY VENOUS (DVT)  Mild intermittent asthma without complication  Positive radioallergosorbent test (RAST)  Elevated IgE level  Decreased diffusion capacity  Coronary artery disease involving native coronary artery of native heart without angina pectoris  Cardiomyopathy, unspecified type (HCC)  Possible Pulmonary hypertension (HCC)  Bilateral lower extremity edema - Plan: VAS [place] LOWER EXTREMITY VENOUS (DVT)  Discussion:  This is an 84 year old with continued dyspnea on exertion.  Prior PFTs with a moderately reduced DLCO normal lung volumes normal spirometry.  Prior heart catheterization with minimally elevated mean PA pressures.  Suspect this is related to his underlying cardiac disease.  He is bradycardic today in the office.  However hemodynamically stable with no symptoms.  He is currently taking carvedilol for his heart failure regimen.  I do wonder if he feels dyspnea on exertion due to poor chronotropic response and being beta blocked for his heart failure management.  Additionally he may be limited just because of his heart failure.  He most definitely has evidence of atopic asthma with significant RAST panel positivity as well as an IgE of 257.  Thirdly, he has significant swelling of his right lower extremity as well as warmth after fall.  Will need to rule  out presence of VTE.  Today recommend the following:  Will change patient's inhaler regimen from Flovent to [place] once daily. Can continue albuterol as needed Recommend starting Flonase nasal spray as well as Allegra daily.  His medications can be obtained over-the-counter. We will send patient for stat duplex of lower extremity to rule out VTE.  Will arrange to have this completed today.  If this can be completed today have advised patient go to the emergency room for evaluation. We will discuss with his primary cardiologist. Return to  clinic in 6 weeks or if symptoms worsen  Greater than 50% of this patient's 40-minute office visit was spent face-to-face counseling on medications, reviewing lab work, reviewing PFTs with patient and wife and discussing etiologies of his dyspnea on exertion.  Also reviewing new medication regimen and described above.   Current Outpatient Medications:  .  albuterol (PROVENTIL HFA;VENTOLIN HFA) 108 (90 Base) MCG/ACT inhaler, Inhale 2 puffs into the lungs every 6 (six) hours as needed for wheezing or shortness of breath., Disp: 1 Inhaler, Rfl: 6 .  alfuzosin (UROXATRAL) 10 MG 24 hr tablet, Take 1 tablet (10 mg total) by mouth daily with breakfast., Disp: 30 tablet, Rfl: 2 .  ALPRAZolam (XANAX) 0.5 MG tablet, Take 0.5 mg by mouth as needed for anxiety. , Disp: , Rfl:  .  aspirin 81 MG tablet, Take 81 mg by mouth daily.  , Disp: , Rfl:  .  beclomethasone (QVAR) 80 MCG/ACT inhaler, Inhale 1 puff into the lungs 2 (two) times daily., Disp: , Rfl:  .  carvedilol (COREG) 3.125 MG tablet, Take 0.5 tablets (1.5625 mg total) by mouth 2 (two) times daily., Disp: 45 tablet, Rfl: 3 .  clopidogrel (PLAVIX) 75 MG tablet, Take 1 tablet (75 mg total) by mouth daily., Disp: 30 tablet, Rfl: 11 .  Coenzyme Q10 (COQ10) 200 MG CAPS, Take 1 capsule by mouth daily.  , Disp: , Rfl:  .  EPIPEN 2-PAK 0.3 MG/0.3ML SOAJ injection, Inject 1 Dose as directed as needed., Disp: , Rfl: 0 .   ergocalciferol (VITAMIN D2) 50000 UNITS capsule, Take 50,000 Units by mouth 2 (two) times a week. , Disp: , Rfl:  .  Evolocumab (REPATHA) 140 MG/ML SOSY, Inject into the skin every 14 (fourteen) days., Disp: , Rfl:  .  ezetimibe (ZETIA) 10 MG tablet, Take 1 tablet (10 mg total) by mouth daily., Disp: 30 tablet, Rfl: 3 .  finasteride (PROSCAR) 5 MG tablet, Take 5 mg by mouth daily.  , Disp: , Rfl:  .  furosemide (LASIX) 20 MG tablet, Take 1 tablet (20 mg total) by mouth daily., Disp: 90 tablet, Rfl: 3 .  hydrocortisone (CORTEF) 10 MG tablet, Take 20 mg by mouth 2 (two) times daily., Disp: , Rfl:  .  levothyroxine (SYNTHROID, LEVOTHROID) 150 MCG tablet, Take 150 mcg by mouth daily before breakfast. , Disp: , Rfl:  .  montelukast (SINGULAIR) 10 MG tablet, Take 1 tablet (10 mg total) by mouth at bedtime., Disp: 30 tablet, Rfl: 11 .  MYRBETRIQ 50 MG TB24 tablet, Take 50 mg by mouth daily., Disp: , Rfl: 0 .  nitroGLYCERIN (NITROSTAT) 0.4 MG SL tablet, Place 1 tablet (0.4 mg total) under the tongue every 5 (five) minutes as needed for chest pain., Disp: 90 tablet, Rfl: 3 .  Omega-3 1000 MG CAPS, Take 1 capsule (1,000 mg total) by mouth 2 (two) times daily., Disp: 60 capsule, Rfl: 11 .  omeprazole (PRILOSEC) 40 MG capsule, Take 40 mg by mouth daily., Disp: , Rfl: 1 .  potassium chloride (K-DUR) 10 MEQ tablet, Take 2 tablets (20 mEq total) by mouth daily., Disp: 180 tablet, Rfl: 3 .  Propylene Glycol (SYSTANE BALANCE OP), Apply 2 drops to eye daily as needed (dry eye)., Disp: , Rfl:  .  Red Yeast Rice Extract 600 MG CAPS, Take 600 mg by mouth daily., Disp: , Rfl:  .  tadalafil (CIALIS) 5 MG tablet, Take 1 tablet (5 mg total) by mouth daily as needed for erectile dysfunction. Do not take  nitro within 48 hrs after taking cialis, Disp: 10 tablet, Rfl: 1 .  testosterone cypionate (DEPOTESTOTERONE CYPIONATE) 200 MG/ML injection, Inject into the muscle every 21 ( twenty-one) days. 1.62 % injection, Disp: , Rfl:  .   fexofenadine (ALLEGRA ALLERGY) 180 MG tablet, Take 1 tablet (180 mg total) by mouth daily., Disp: , Rfl:  .  fluticasone (FLONASE) 50 MCG/ACT nasal spray, Place 2 sprays into both nostrils daily., Disp: 16 g, Rfl: 2 .  fluticasone furoate-vilanterol (BREO ELLIPTA) 200-25 MCG/INH AEPB, Inhale 1 puff into the lungs daily., Disp: 1 each, Rfl: 0   Garner Nash, DO [facility] 08/21/2018 12:03 PM

## 2018-08-21 NOTE — ED Provider Notes (Signed)
   Care assumed from Blanchie Dessert, MD at shift change with CTA of chest pending.   In brief, this patient is a 84 y.o. male with past medical history of CAD, cardiomyopathy, hypertension, hyperlipidemia, chronic kidney disease who presents for evaluation of known DVT.  Patient reports that approximately 2 weeks ago, he injured his leg and since then been having some redness and swelling.  He went to his cardiologist today and arrange for an ultrasound of his leg to rule out DVT.  Today he had a confirmation of DVT of the right lower extremity.  Patient also reports he has had this 36-month history of shortness of breath.  He states that it is worse with exertion when he rests, becomes better.  Patient reports no history of prior clot and takes aspirin and Plavix.  Patient denies any chest pain.  PLAN: Patient is pending CTA of chest for evaluation of the clot in the lung.  MDM:  CTA of chest shows small burden of bilateral pulmonary emboli.  No evidence of right heart strain.  Discussed results with patient.  He reports his shortness of breath has been ongoing for last 3 months.  I suspect that this is not likely related to evidence of small PEs found on his exam today.  It is only currently on Plavix or aspirin.  Discussed patient with Dr. Alvino Chapel.  Given patient's age, comorbidities, recommend admission for treatment of small PEs, DVT.  Discussed patient with hospitalist.  Will admit.   1. Acute deep vein thrombosis (DVT) of distal vein of right lower extremity ([place])   2. Bilateral pulmonary embolism (HCC)        Volanda Napoleon, PA-C 08/21/18 2231    Davonna Belling, MD 08/21/18 2308

## 2018-08-21 NOTE — ED Triage Notes (Signed)
Pt reports right leg swelling and redness to leg for two weeks. Pt endorses having DVT study today which confirmed a blood clot in his right lower extremity. Pt denies pain. Pt reports they sent him here for blood work prior to starting him on a blood thinner.

## 2018-08-21 NOTE — ED Notes (Signed)
ED Provider at bedside. 

## 2018-08-21 NOTE — ED Notes (Signed)
Pt returned from CT °

## 2018-08-21 NOTE — Telephone Encounter (Signed)
PCCM:  Received call from vascular lab.  Right lower extremity positive for DVT into the femoral vein.  I discussed this directly with the patient via phone while he was in the vascular lab.  Patient has multiple medical comorbidities which place him at high risk DVT.  His ongoing dyspnea on exertion could be related to concomitant pulmonary embolism.  Recommended patient to go to the emergency room at [facility] for further evaluation and to initiate anticoagulation.  I called and discussed this with Dr. Vallery Ridge, [facility], ER physician.  Garner Nash, DO [facility] 08/21/2018 3:50 PM

## 2018-08-21 NOTE — ED Notes (Signed)
Patient transported to CT 

## 2018-08-21 NOTE — ED Provider Notes (Signed)
[facility] EMERGENCY DEPARTMENT Provider Note   CSN: [id number] Arrival date & time: 08/21/18  1621     History   Chief Complaint Chief Complaint  Patient presents with  . 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  Patient is an 84 year old male with a history of coronary artery disease that is post stent, cardiomyopathy, hypertension, hyperlipidemia, chronic kidney disease with a history of 3 months of shortness of breath that is slowly getting better presenting today from cardiology clinic after having a positive ultrasound for DVT in his right lower extremity.  Patient states approximately 1 and 1/2 weeks ago he was at a football game when he was walking down the stairs and someone fell on him from behind causing him to fall and get his right leg caught and twisted under him.  When he got home that night he noticed it was painful and swollen but thought it was related to the fall.  However for the last week he is ignored it until today when he went to the cardiology office they noted that it was extremely swollen and felt that he needed to have an ultrasound done.  He is denying any chest pain, fatigue, nausea, vomiting.  He has no prior history of clot and takes aspirin and Plavix.  In the past since he has had the shortness of breath he has had a CT of his chest without contrast which was within normal limits.  The history is provided by the patient.    Past Medical History:  Diagnosis Date  . Atrial premature beats   . BPH (benign prostatic hyperplasia)   . BPH (benign prostatic hypertrophy)   . Cataract, right eye   . Chronic low back pain   . Colon polyp   . Coronary artery disease   . DDD (degenerative disc disease), lumbar   . Dermatitis 11/16  . Elevated IgE level 06/30/2018  . Hyperlipidemia   . Insomnia   . Kidney cysts   . Memory loss 2015  . Non-ischemic cardiomyopathy ([place])   . NSTEMI (non-ST elevated myocardial infarction)  ([place]) 08/25/2014  . Osteoarthritis of right knee   . Pituitary tumor 2011   macroadenoma w/ VF compromise-then gamma knife a few years leater for some recurrence  . Positive radioallergosorbent test (RAST) 06/30/2018  . Status post insertion of drug-eluting stent into left anterior descending (LAD) artery 08/28/15   + nuc study.   . Urinary retention   . Vitamin D deficiency 01/2008    Patient Active Problem List   Diagnosis Date Noted  . Positive radioallergosorbent test (RAST) 06/30/2018  . Elevated IgE level 06/30/2018  . Hiatal hernia 06/27/2018  . Atypical angina ([place]) 08/08/2017  . RBBB, anterior fascicular block and incompl posterior fascicular block 08/08/2017  . Urinary retention due to benign prostatic hyperplasia 12/05/2016  . Overactive bladder 12/05/2016  . Acute adrenal insufficiency ([place]) 12/03/2016  . AKI (acute kidney injury) ([place])   . Right bundle branch block   . Orthostatic hypotension   . Coronary artery disease involving native coronary artery of native heart without angina pectoris   . History of ST elevation myocardial infarction (STEMI)   . H/O: pituitary tumor   . Chronic bilateral low back pain without sciatica   . Hyponatremia   . Leukocytosis   . Falls 12/02/2016  . Diarrhea 12/02/2016  . Syncope 12/02/2016  . Nasal fracture 12/02/2016  . Abnormal stress test 07/29/2015  .  Angina effort   . Abnormal nuclear stress test   . NSTEMI (non-ST elevated myocardial infarction) ([place]) 08/25/2014  . Basal cell carcinoma 07/15/2014  . Open wound of nose with complication 75/91/6384  . Disease of upper respiratory system 07/15/2014  . Chronic infection of sinus 07/15/2014  . Dyssomnia 07/15/2014  . Dermatologic disease 04/05/2014  . CA of skin 04/05/2014  . Pituitary macroadenoma with extrasellar extension ([place]) 01/13/2014  . 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 Benign neoplasm of pituitary gland ([place]) 01/13/2014  . Mild  cognitive disorder 01/13/2014  . PAC (premature atrial contraction) 12/10/2013  . APC (atrial premature contractions) 12/10/2013  . HYPERCHOLESTEROLEMIA  IIA 02/10/2009  . CAD, NATIVE VESSEL 02/10/2009  . Cardiomyopathy ([place]) 02/10/2009  . BRADYCARDIA 02/10/2009  . Cardiac conduction disorder 02/10/2009  . CAD in native artery 02/10/2009    Past Surgical History:  Procedure Laterality Date  . ARTHROSCOPIC SURGERY    . BACK SURGERY    . CARDIAC CATHETERIZATION  07/27/2015   Dr Aundra Dubin; oLAD 95%, mLAD 50%, CFX stent OK, OM1 60%, OM2 99%, pPLOM 50%, RCA irregular, AM2 70%, EF 55-60%, no regional wall motion abnormalities.     Marland Kitchen CARDIAC CATHETERIZATION N/A 07/28/2015   Procedure: Left Heart Cath and Coronary Angiography;  Surgeon: [name], MD;  Location: [facility];  Service: Cardiovascular;  Laterality: N/A;  . CARDIAC CATHETERIZATION N/A 07/28/2015   Procedure: Coronary Stent Intervention;  Surgeon: Troy Sine, MD;  Location: [facility];  Service: Cardiovascular;  Laterality: N/A;  . CATARACT EXTRACTION    . CORONARY ANGIOPLASTY WITH STENT PLACEMENT  07/27/2015   XIENCE ALPINE RX 3.0X33 DES to the LAD, 95%>>0  . HAMMER  BUNION TOE SURGERY    . HAND SURGERY Left   . LEFT HEART CATHETERIZATION WITH CORONARY ANGIOGRAM N/A 08/25/2014   Procedure: LEFT HEART CATHETERIZATION WITH CORONARY ANGIOGRAM;  Surgeon: Burnell Blanks, MD;  Location: [facility] CATH LAB;  Service: Cardiovascular;  Laterality: N/A;  . PITUITARY SURGERY     12/01/09 Dr. Wilburn Cornelia and [name].  Marland Kitchen RIGHT/LEFT HEART CATH AND CORONARY ANGIOGRAPHY N/A 03/19/2018   Procedure: RIGHT/LEFT HEART CATH AND CORONARY ANGIOGRAPHY;  Surgeon: [name], MD;  Location: [facility];  Service: Cardiovascular;  Laterality: N/A;        Home Medications    Prior to Admission medications   Medication Sig Start Date End Date Taking? Authorizing Provider  albuterol (PROVENTIL HFA;VENTOLIN HFA) 108  (90 Base) MCG/ACT inhaler Inhale 2 puffs into the lungs every 6 (six) hours as needed for wheezing or shortness of breath. 06/27/18   Icard, Octavio Graves, DO  alfuzosin (UROXATRAL) 10 MG 24 hr tablet Take 1 tablet (10 mg total) by mouth daily with breakfast. 12/07/16   Rama, Venetia Maxon, MD  ALPRAZolam Duanne Moron) 0.5 MG tablet Take 0.5 mg by mouth as needed for anxiety.     [provider]  aspirin 81 MG tablet Take 81 mg by mouth daily.      [provider]  beclomethasone (QVAR) 80 MCG/ACT inhaler Inhale 1 puff into the lungs 2 (two) times daily.    [provider]  carvedilol (COREG) 3.125 MG tablet Take 0.5 tablets (1.5625 mg total) by mouth 2 (two) times daily. 05/12/18   [name], MD  clopidogrel (PLAVIX) 75 MG tablet Take 1 tablet (75 mg total) by mouth daily. 05/27/18   [name], MD  Coenzyme Q10 (  COQ10) 200 MG CAPS Take 1 capsule by mouth daily.      [provider]  EPIPEN 2-PAK 0.3 MG/0.3ML SOAJ injection Inject 1 Dose as directed as needed. 05/20/14   [provider]  ergocalciferol (VITAMIN D2) 50000 UNITS capsule Take 50,000 Units by mouth 2 (two) times a week.     [provider]  Evolocumab (REPATHA) 140 MG/ML SOSY Inject into the skin every 14 (fourteen) days. 02/17/15   [name], MD  ezetimibe (ZETIA) 10 MG tablet Take 1 tablet (10 mg total) by mouth daily. 07/29/18   [name], MD  fexofenadine [facility] ALLERGY) 180 MG tablet Take 1 tablet (180 mg total) by mouth daily. 08/21/18   Icard, Octavio Graves, DO  finasteride (PROSCAR) 5 MG tablet Take 5 mg by mouth daily.      [provider]  fluticasone (FLONASE) 50 MCG/ACT nasal spray Place 2 sprays into both nostrils daily. 08/21/18   Icard, Octavio Graves, DO  fluticasone furoate-vilanterol (BREO ELLIPTA) 200-25 MCG/INH AEPB Inhale 1 puff into the lungs daily. 08/21/18   Icard, Octavio Graves, DO  furosemide (LASIX) 20 MG tablet Take 1 tablet (20 mg total) by mouth  daily. 06/05/18   [name], MD  hydrocortisone (CORTEF) 10 MG tablet Take 20 mg by mouth 2 (two) times daily.    [provider]  levothyroxine (SYNTHROID, LEVOTHROID) 150 MCG tablet Take 150 mcg by mouth daily before breakfast.     [provider]  montelukast (SINGULAIR) 10 MG tablet Take 1 tablet (10 mg total) by mouth at bedtime. 07/01/18   Icard, Bradley L, DO  MYRBETRIQ 50 MG TB24 tablet Take 50 mg by mouth daily. 11/22/16   [provider]  nitroGLYCERIN (NITROSTAT) 0.4 MG SL tablet Place 1 tablet (0.4 mg total) under the tongue every 5 (five) minutes as needed for chest pain. 03/10/18   [name], MD  Omega-3 1000 MG CAPS Take 1 capsule (1,000 mg total) by mouth 2 (two) times daily. 06/26/16   [name], MD  omeprazole (PRILOSEC) 40 MG capsule Take 40 mg by mouth daily. 11/23/16   [provider]  potassium chloride (K-DUR) 10 MEQ tablet Take 2 tablets (20 mEq total) by mouth daily. 05/27/18   [name], MD  Propylene Glycol (SYSTANE BALANCE OP) Apply 2 drops to eye daily as needed (dry eye).    [provider]  Red Yeast Rice Extract 600 MG CAPS Take 600 mg by mouth daily. 07/15/14   [provider]  tadalafil (CIALIS) 5 MG tablet Take 1 tablet (5 mg total) by mouth daily as needed for erectile dysfunction. Do not take nitro within 48 hrs after taking cialis 07/29/15   Barrett, Evelene Croon, PA-C  testosterone cypionate (DEPOTESTOTERONE CYPIONATE) 200 MG/ML injection Inject into the muscle every 21 ( twenty-one) days. 1.62 % injection    [provider]    Family History Family History  Problem Relation Age of Onset  . Congestive Heart Failure Father 98  . Other Mother 30  . Coronary artery disease Brother   . Other Sister 65       polio  . Heart attack Brother        MI/ASCAD 2006  . COPD Sister 78    Social History Social History   Tobacco Use  . Smoking status: Never Smoker  . Smokeless  tobacco: Never Used  Substance Use Topics  . Alcohol use: Yes    Comment:  occas.  . Drug use: No     Allergies   Sulfamethoxazole; Bee venom; Sulfa antibiotics; Sulfonamide derivatives; and Tamsulosin   Review of Systems Review of Systems  All other systems reviewed and are negative.    Physical Exam Updated Vital Signs BP (!) 150/102   Pulse (!) 59   Temp 97.9 F (36.6 C) (Oral)   Resp 16   Ht 6\' 3"  (1.905 m)   Wt 108.9 kg   SpO2 97%   BMI 30.00 kg/m   Physical Exam  Constitutional: He is oriented to person, place, and time. He appears well-developed and well-nourished. No distress.  HENT:  Head: Normocephalic and atraumatic.  Mouth/Throat: Oropharynx is clear and moist.  Eyes: Pupils are equal, round, and reactive to light. Conjunctivae and EOM are normal.  Neck: Normal range of motion. Neck supple.  Cardiovascular: Normal rate, regular rhythm and intact distal pulses.  No murmur heard. Pulmonary/Chest: Effort normal and breath sounds normal. No respiratory distress. He has no wheezes. He has no rales.  Abdominal: Soft. He exhibits no distension. There is no tenderness. There is no rebound and no guarding.  Musculoskeletal: Normal range of motion. He exhibits edema and tenderness.  3+ pitting edema of the right lower extremity from the knee down.  Mild erythema and warmth also noted  Neurological: He is alert and oriented to person, place, and time.  Skin: Skin is warm and dry. No rash noted. No erythema.  Psychiatric: He has a normal mood and affect. His behavior is normal.  Nursing note and vitals reviewed.    ED Treatments / Results  Labs (all labs ordered are listed, but only abnormal results are displayed) Labs Reviewed  CBC WITH DIFFERENTIAL/PLATELET - Abnormal; Notable for the following components:      Result Value   Hemoglobin 12.8 (*)    All other components within normal limits  BASIC METABOLIC PANEL - Abnormal; Notable for the following  components:   BUN 27 (*)    Creatinine, Ser 1.53 (*)    GFR calc non Af Amer 41 (*)    GFR calc Af Amer 47 (*)    All other components within normal limits  APTT  PROTIME-INR  IRON AND TIBC  FERRITIN    EKG None  Radiology No results found.  Procedures Procedures (including critical care time)  Medications Ordered in ED Medications - No data to display   Initial Impression / Assessment and Plan / ED Course  I have reviewed the triage vital signs and the nursing notes.  Pertinent labs & imaging results that were available during my care of the patient were reviewed by me and considered in my medical decision making (see chart for details).     Elderly gentleman with multiple medical problems presenting today after being seen in vascular lab and having a right DVT up into the femoral vein.  Patient is also noted for the last 3 months he has had shortness of breath which he states is 50% improved.  He denies any chest pain today.  No prior history of clots and he does take aspirin and Plavix.  Labs pending, CBC, coags and BMP to ensure normal renal function.  Also will do a CTA to rule out PE as the source of his shortness of breath.  Pt checked out to [facility] to f/u with CTA.  Final Clinical Impressions(s) / ED Diagnoses   Final diagnoses:  None    ED Discharge Orders    None  Blanchie Dessert, MD 08/22/18 1058

## 2018-08-21 NOTE — Progress Notes (Signed)
ANTICOAGULATION CONSULT NOTE - Initial Consult  Pharmacy Consult for Lovenox Indication: pulmonary embolus and DVT  Allergies  Allergen Reactions  . Sulfa Antibiotics Anaphylaxis, Nausea And Vomiting and Swelling  . Sulfamethoxazole Anaphylaxis, Nausea And Vomiting and Swelling  . Sulfonamide Derivatives Anaphylaxis, Nausea And Vomiting and Swelling  . Bee Venom Rash  . Flonase [Fluticasone] Other (See Comments)    Caused nose bleeds  . Tamsulosin Other (See Comments)    Unknown reaction- Patient doesn't think he is allergic (??)    Patient Measurements: Height: 6\' 3"  (190.5 cm) Weight: 240 lb 8.4 oz (109.1 kg) IBW/kg (Calculated) : 84.5  Vital Signs: Temp: 97.4 F (36.3 C) (11/21 2329) Temp Source: Oral (11/21 2329) BP: 135/79 (11/21 2329) Pulse Rate: 60 (11/21 2329)  Labs: Recent Labs    08/21/18 1705  HGB 12.8*  HCT 40.4  PLT 274  APTT 28  LABPROT 14.6  INR 1.15  CREATININE 1.53*    Estimated Creatinine Clearance: 50.5 mL/min (A) (by C-G formula based on SCr of 1.53 mg/dL (H)).   Medical History: Past Medical History:  Diagnosis Date  . Atrial premature beats   . BPH (benign prostatic hyperplasia)   . BPH (benign prostatic hypertrophy)   . Cataract, right eye   . Chronic low back pain   . Colon polyp   . Coronary artery disease   . DDD (degenerative disc disease), lumbar   . Dermatitis 11/16  . Elevated IgE level 06/30/2018  . Hyperlipidemia   . Insomnia   . Kidney cysts   . Memory loss 2015  . Non-ischemic cardiomyopathy ([place])   . NSTEMI (non-ST elevated myocardial infarction) ([place]) 08/25/2014  . Osteoarthritis of right knee   . Pituitary tumor 2011   macroadenoma w/ VF compromise-then gamma knife a few years leater for some recurrence  . Positive radioallergosorbent test (RAST) 06/30/2018  . Status post insertion of drug-eluting stent into left anterior descending (LAD) artery 08/28/15   + nuc study.   . Urinary retention   . Vitamin D  deficiency 01/2008     Assessment: 84 y/o M with progressive leg swelling s/p fall a few weeks ago, outpatient work-up positive for DVT in the femoral vein, CT angio in the ED reveals small burden bilateral PE with no heart strain. PTA meds have been reviewed. Hgb 12.8. Noted renal dysfunction.  Goal of Therapy:  Monitor platelets by anticoagulation protocol: Yes   Plan:  Lovenox 1 mg/kg subcutaneous q12h Daily CBC Monitor for bleeding F/U plans for transition to oral anti-coagulation   Narda Bonds 08/21/2018,11:38 PM

## 2018-08-21 NOTE — H&P (Signed)
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  Patient states he fell 2 weeks ago at a football game after being pushed by people.  States his right calf has been swollen since then.  Denies having any pain.  Denies any history of blood clots.  States he is physically very active.  No recent long distance travel.  Also reports having a 52-month history of dyspnea at all times.  Denies having any cough or chest pain.  Denies having any fevers or chills.  Denies having any melena or hematochezia.  Patient was seen by his pulmonologist Dr. Valeta Harms today and noted to have significant right lower extremity swelling and warmth.  Doppler was positive for DVT into the femoral vein.  Patient was advised to come into the hospital.  ED Course: On arrival, afebrile, pulse 59, respiratory rate 16, blood pressure 150/102, and SPO2 97% on room air.  No leukocytosis.  CTA showing small burden of bilateral PE with no evidence of right heart strain.  Review of Systems: As per HPI otherwise 10 point review of systems negative.  Past Medical History:  Diagnosis Date  . Atrial premature beats   . BPH (benign prostatic hyperplasia)   . BPH (benign prostatic hypertrophy)   . Cataract, right eye   . Chronic low back pain   . Colon polyp   . Coronary artery disease   . DDD (degenerative disc disease), lumbar   . Dermatitis 11/16  . Elevated IgE level 06/30/2018  . Hyperlipidemia   . Insomnia   . Kidney cysts   . Memory loss 2015  . Non-ischemic cardiomyopathy ([place])   . NSTEMI (non-ST elevated myocardial infarction) ([place]) 08/25/2014  . Osteoarthritis of  right knee   . Pituitary tumor 2011   macroadenoma w/ VF compromise-then gamma knife a few years leater for some recurrence  . Positive radioallergosorbent test (RAST) 06/30/2018  . Status post insertion of drug-eluting stent into left anterior descending (LAD) artery 08/28/15   + nuc study.   . Urinary retention   . Vitamin D deficiency 01/2008    Past Surgical History:  Procedure Laterality Date  . ARTHROSCOPIC SURGERY    . BACK SURGERY    . CARDIAC CATHETERIZATION  07/27/2015   Dr Aundra Dubin; oLAD 95%, mLAD 50%, CFX stent OK, OM1 60%, OM2 99%, pPLOM 50%, RCA irregular, AM2 70%, EF 55-60%, no regional wall motion abnormalities.     Marland Kitchen CARDIAC CATHETERIZATION N/A 07/28/2015   Procedure: Left Heart Cath and Coronary Angiography;  Surgeon: [name], MD;  Location: [facility];  Service: Cardiovascular;  Laterality: N/A;  . CARDIAC CATHETERIZATION N/A 07/28/2015   Procedure: Coronary Stent Intervention;  Surgeon: Troy Sine, MD;  Location: [facility];  Service: Cardiovascular;  Laterality: N/A;  . CATARACT EXTRACTION    . CORONARY ANGIOPLASTY WITH STENT PLACEMENT  07/27/2015   XIENCE ALPINE RX 3.0X33 DES to the LAD, 95%>>0  . HAMMER  BUNION TOE SURGERY    . HAND SURGERY Left   . LEFT HEART CATHETERIZATION WITH CORONARY ANGIOGRAM N/A 08/25/2014   Procedure: LEFT HEART CATHETERIZATION WITH CORONARY ANGIOGRAM;  Surgeon: Burnell Blanks, MD;  Location[facility] C[facility];  Service: Cardiovascular;  Laterality: N/A;  . PITUITARY SURGERY     12/01/09 Dr. Wilburn Cornelia and [name].  Marland Kitchen RIGHT/LEFT HEART CATH AND CORONARY ANGIOGRAPHY N/A 03/19/2018   Procedure: RIGHT/LEFT HEART CATH AND CORONARY ANGIOGRAPHY;  Surgeon: [name], MD;  Location: [facility];  Service: Cardiovascular;  Laterality: N/A;     reports that he has never smoked. He has never used smokeless tobacco. He reports that he drinks alcohol. He reports that he does not use drugs.  Allergies    Allergen Reactions  . Sulfa Antibiotics Anaphylaxis, Nausea And Vomiting and Swelling  . Sulfamethoxazole Anaphylaxis, Nausea And Vomiting and Swelling  . Sulfonamide Derivatives Anaphylaxis, Nausea And Vomiting and Swelling  . Bee Venom Rash  . Flonase [Fluticasone] Other (See Comments)    Caused nose bleeds  . Tamsulosin Other (See Comments)    Unknown reaction- Patient doesn't think he is allergic (??)    Family History  Problem Relation Age of Onset  . Congestive Heart Failure Father 9  . Other Mother 31  . Coronary artery disease Brother   . Other Sister 32       polio  . Heart attack Brother        MI/ASCAD 2006  . COPD Sister 53    Prior to Admission medications   Medication Sig Start Date End Date Taking? Authorizing Provider  albuterol (PROVENTIL HFA;VENTOLIN HFA) 108 (90 Base) MCG/ACT inhaler Inhale 2 puffs into the lungs every 6 (six) hours as needed for wheezing or shortness of breath. 06/27/18   Icard, Octavio Graves, DO  alfuzosin (UROXATRAL) 10 MG 24 hr tablet Take 1 tablet (10 mg total) by mouth daily with breakfast. 12/07/16   Rama, Venetia Maxon, MD  ALPRAZolam Duanne Moron) 0.5 MG tablet Take 0.5 mg by mouth as needed for anxiety.     [provider]  aspirin 81 MG tablet Take 81 mg by mouth daily.      [provider]  beclomethasone (QVAR) 80 MCG/ACT inhaler Inhale 1 puff into the lungs 2 (two) times daily.    [provider]  carvedilol (COREG) 3.125 MG tablet Take 0.5 tablets (1.5625 mg total) by mouth 2 (two) times daily. 05/12/18   [name], MD  clopidogrel (PLAVIX) 75 MG tablet Take 1 tablet (75 mg total) by mouth daily. 05/27/18   [name], MD  Coenzyme Q10 (COQ10) 200 MG CAPS Take 1 capsule by mouth daily.      [provider]  EPIPEN 2-PAK 0.3 MG/0.3ML SOAJ injection Inject 1 Dose as directed as needed. 05/20/14   [provider]  ergocalciferol (VITAMIN D2) 50000 UNITS capsule Take 50,000 Units by mouth 2  (two) times a week.     [provider]  Evolocumab (REPATHA) 140 MG/ML SOSY Inject into the skin every 14 (fourteen) days. 02/17/15   [name], MD  ezetimibe (ZETIA) 10 MG tablet Take 1 tablet (10 mg total) by mouth daily. 07/29/18   [name], MD  fexofenadine [facility] ALLERGY) 180 MG tablet Take 1 tablet (180 mg total) by mouth daily. 08/21/18   Icard, Octavio Graves, DO  finasteride (PROSCAR) 5 MG tablet Take 5 mg by mouth daily.      [provider]  fluticasone (FLONASE) 50 MCG/ACT nasal spray Place 2 sprays into both nostrils daily. 08/21/18   Icard, Leory Plowman L, DO  fluticasone furoate-vilanterol (BREO ELLIPTA) 200-25 MCG/INH AEPB  Inhale 1 puff into the lungs daily. 08/21/18   Icard, Octavio Graves, DO  furosemide (LASIX) 20 MG tablet Take 1 tablet (20 mg total) by mouth daily. 06/05/18   [name], MD  hydrocortisone (CORTEF) 10 MG tablet Take 20 mg by mouth 2 (two) times daily.    [provider]  levothyroxine (SYNTHROID, LEVOTHROID) 150 MCG tablet Take 150 mcg by mouth daily before breakfast.     [provider]  montelukast (SINGULAIR) 10 MG tablet Take 1 tablet (10 mg total) by mouth at bedtime. 07/01/18   Icard, Bradley L, DO  MYRBETRIQ 50 MG TB24 tablet Take 50 mg by mouth daily. 11/22/16   [provider]  nitroGLYCERIN (NITROSTAT) 0.4 MG SL tablet Place 1 tablet (0.4 mg total) under the tongue every 5 (five) minutes as needed for chest pain. 03/10/18   [name], MD  Omega-3 1000 MG CAPS Take 1 capsule (1,000 mg total) by mouth 2 (two) times daily. 06/26/16   [name], MD  omeprazole (PRILOSEC) 40 MG capsule Take 40 mg by mouth daily. 11/23/16   [provider]  potassium chloride (K-DUR) 10 MEQ tablet Take 2 tablets (20 mEq total) by mouth daily. 05/27/18   [name], MD  Propylene Glycol (SYSTANE BALANCE OP) Apply 2 drops to eye daily as needed (dry eye).    [provider]  Red Yeast Rice  Extract 600 MG CAPS Take 600 mg by mouth daily. 07/15/14   [provider]  tadalafil (CIALIS) 5 MG tablet Take 1 tablet (5 mg total) by mouth daily as needed for erectile dysfunction. Do not take nitro within 48 hrs after taking cialis 07/29/15   Barrett, Evelene Croon, PA-C  testosterone cypionate (DEPOTESTOTERONE CYPIONATE) 200 MG/ML injection Inject into the muscle every 21 ( twenty-one) days. 1.62 % injection    [provider]    Physical Exam: Vitals:   08/21/18 1630 08/21/18 2329 08/21/18 2332 08/22/18 0037  BP: (!) 150/102 135/79    Pulse: (!) 59 60  68  Resp: 16 18    Temp: 97.9 F (36.6 C) (!) 97.4 F (36.3 C)    TempSrc: Oral Oral    SpO2: 97% 100%    Weight:   109.1 kg   Height:   6\' 3"  (1.905 m)     Physical Exam  Constitutional: He is oriented to person, place, and time. He appears well-developed and well-nourished. No distress.  HENT:  Head: Normocephalic.  Mouth/Throat: Oropharynx is clear and moist.  Eyes: Right eye exhibits no discharge. Left eye exhibits no discharge.  Neck: Neck supple. No tracheal deviation present.  Cardiovascular: Normal rate, regular rhythm and intact distal pulses.  Pulmonary/Chest: Effort normal and breath sounds normal. No respiratory distress. He has no wheezes. He has no rales.  Breathing comfortably on room air  Abdominal: Soft. Bowel sounds are normal. He exhibits no distension. There is no tenderness.  Musculoskeletal: He exhibits edema.  +3 pitting edema, erythema, increased warmth to touch of right lower extremity. +2 pitting edema of left lower extremity.  Neurological: He is alert and oriented to person, place, and time.  Skin: Skin is warm and dry. He is not diaphoretic.     Labs on Admission: I have personally reviewed following labs and imaging studies  CBC: Recent Labs  Lab 08/21/18 1705  WBC 9.3  NEUTROABS 6.6  HGB 12.8*  HCT 40.4  MCV 95.7  PLT 841   Basic Metabolic Panel: Recent  Labs  Lab  08/21/18 1705  NA 141  K 4.0  CL 107  CO2 26  GLUCOSE 93  BUN 27*  CREATININE 1.53*  CALCIUM 9.3   GFR: Estimated Creatinine Clearance: 50.5 mL/min (A) (by C-G formula based on SCr of 1.53 mg/dL (H)). Liver Function Tests: No results for input(s): AST, ALT, ALKPHOS, BILITOT, PROT, ALBUMIN in the last 168 hours. No results for input(s): LIPASE, AMYLASE in the last 168 hours. No results for input(s): AMMONIA in the last 168 hours. Coagulation Profile: Recent Labs  Lab 08/21/18 1705  INR 1.15   Cardiac Enzymes: No results for input(s): CKTOTAL, CKMB, CKMBINDEX, TROPONINI in the last 168 hours. BNP (last 3 results) No results for input(s): PROBNP in the last 8760 hours. HbA1C: No results for input(s): HGBA1C in the last 72 hours. CBG: No results for input(s): GLUCAP in the last 168 hours. Lipid Profile: No results for input(s): CHOL, HDL, LDLCALC, TRIG, CHOLHDL, LDLDIRECT in the last 72 hours. Thyroid Function Tests: No results for input(s): TSH, T4TOTAL, FREET4, T3FREE, THYROIDAB in the last 72 hours. Anemia Panel: No results for input(s): VITAMINB12, FOLATE, FERRITIN, TIBC, IRON, RETICCTPCT in the last 72 hours. Urine analysis:    Component Value Date/Time   COLORURINE YELLOW 12/02/2016 1215   APPEARANCEUR CLEAR 12/02/2016 1215   LABSPEC 1.017 12/02/2016 1215   PHURINE 5.0 12/02/2016 1215   GLUCOSEU NEGATIVE 12/02/2016 1215   HGBUR SMALL (A) 12/02/2016 1215   BILIRUBINUR NEGATIVE 12/02/2016 1215   KETONESUR NEGATIVE 12/02/2016 1215   PROTEINUR NEGATIVE 12/02/2016 1215   UROBILINOGEN 0.2 02/13/2011 1100   NITRITE NEGATIVE 12/02/2016 1215   LEUKOCYTESUR NEGATIVE 12/02/2016 1215    Radiological Exams on Admission: Ct Angio Chest Pe W And/or Wo Contrast  Result Date: 08/21/2018 CLINICAL DATA:  Right lower extremity DVT. EXAM: CT ANGIOGRAPHY CHEST WITH CONTRAST TECHNIQUE: Multidetector CT imaging of the chest was performed using the standard protocol during bolus  administration of intravenous contrast. Multiplanar CT image reconstructions and MIPs were obtained to evaluate the vascular anatomy. CONTRAST:  66mL ISOVUE-370 IOPAMIDOL (ISOVUE-370) INJECTION 76% COMPARISON:  04/17/2018 FINDINGS: Cardiovascular: The heart is upper limits of normal in size for age. No pericardial effusion. There is tortuosity, ectasia and scattered calcification involving the thoracic aorta. Extensive three-vessel coronary artery calcifications. The pulmonary arterial tree is fairly well opacified. There are small bilateral pulmonary emboli. There is some nonocclusive emboli in the right lower lobe pulmonary artery and additionally some secondary branch vessels. Small left-sided pulmonary emboli are also noted. No evidence of right heart strain. Mediastinum/Nodes: No mediastinal or hilar mass or adenopathy. Small scattered lymph nodes are noted. The esophagus is grossly normal. There is a moderate-sized hiatal hernia. Lungs/Pleura: No significant pulmonary findings. No infiltrates or effusions. Upper Abdomen: No significant upper abdominal findings. There are layering gallstones noted in the gallbladder. Musculoskeletal: No significant bony findings. Review of the MIP images confirms the above findings. IMPRESSION: 1. Small burden of bilateral pulmonary emboli. No evidence of right heart strain. 2. Mild tortuosity and ectasia of the thoracic aorta but no focal aneurysm. 3. No acute pulmonary findings. 4. Three-vessel coronary artery calcifications. 5. Incidental cholelithiasis. Aortic Atherosclerosis (ICD10-I70.0). Electronically Signed   By: Marijo Sanes M.D.   On: 08/21/2018 20:22    Assessment/Plan Principal Problem:   VTE (venous thromboembolism) Active Problems:   Pituitary adenoma (HCC)   CAD (coronary artery disease)   DVT (deep venous thrombosis) (HCC)   Pulmonary embolism (HCC)   CKD (chronic kidney disease) stage  3, GFR 30-59 ml/min (HCC)   Anemia   BPH (benign prostatic  hyperplasia)   Anxiety   HTN (hypertension)   HLD (hyperlipidemia)   Atopic asthma   VTE (DVT and PE) -Only apparent risk factor based on history is trauma to his right lower extremity 2 weeks ago. Doppler was positive for DVT into the femoral vein. CTA showing small burden of bilateral PE with no evidence of right heart strain.  Patient is hemodynamically stable. Not tachycardic, not tachypneic and not hypoxic.  He is breathing comfortably on room air. -Started on low molecular weight heparin.  Consider transitioning patient to NOAC prior to discharge.  -Continue to monitor vitals -Tylenol PRN pain/discomfort  CKD 3 -Stable. Creatinine 1.5, recent baseline 1.2-1.6.  Anemia -Hemoglobin 12.8, was 14.5 a month ago.  Hemoglobin has been as low as 12.3 a year ago.  No signs of active bleeding.  Patient denies having any melena or hematochezia. -Check iron, ferritin, TIBC  History of pituitary adenoma -Continue home Cortef, thyroid hormone replacement therapy  BPH -Continue home alfuzosin, Proscar  Anxiety -Continue home Xanax as needed  Hyperlipidemia -Continue home Zetia, Lovaza  CAD -Stable.  No anginal symptoms.  Continue home Coreg, Plavix.  Atopic asthma -Stable.  No bronchospasm.  Continue home Breo Ellipta, Claritin, Singulair  DVT prophylaxis: Lovenox Code Status: Patient wishes to be full code. Family Communication: No family available. Disposition Plan: Anticipate discharge to home in 1 day. Consults called: None Admission status: Observation  Shela Leff MD [facility] Pager [phone]  If 7PM-7AM, please contact night-coverage [email or web address] Password [email or web address]  08/22/2018, 2:07 AM

## 2018-08-21 NOTE — ED Notes (Addendum)
Admitting provider at bedside.

## 2018-08-22 ENCOUNTER — Encounter (HOSPITAL_COMMUNITY): Payer: Self-pay | Admitting: Cardiology

## 2018-08-22 ENCOUNTER — Encounter (HOSPITAL_COMMUNITY): Payer: Self-pay

## 2018-08-22 DIAGNOSIS — I10 Essential (primary) hypertension: Secondary | ICD-10-CM

## 2018-08-22 DIAGNOSIS — F419 Anxiety disorder, unspecified: Secondary | ICD-10-CM

## 2018-08-22 DIAGNOSIS — I2699 Other pulmonary embolism without acute cor pulmonale: Secondary | ICD-10-CM

## 2018-08-22 DIAGNOSIS — I824Z1 Acute embolism and thrombosis of unspecified deep veins of right distal lower extremity: Secondary | ICD-10-CM | POA: Diagnosis not present

## 2018-08-22 DIAGNOSIS — N183 Chronic kidney disease, stage 3 unspecified: Secondary | ICD-10-CM

## 2018-08-22 DIAGNOSIS — N400 Enlarged prostate without lower urinary tract symptoms: Secondary | ICD-10-CM

## 2018-08-22 DIAGNOSIS — I82409 Acute embolism and thrombosis of unspecified deep veins of unspecified lower extremity: Secondary | ICD-10-CM

## 2018-08-22 DIAGNOSIS — I8290 Acute embolism and thrombosis of unspecified vein: Secondary | ICD-10-CM | POA: Diagnosis not present

## 2018-08-22 DIAGNOSIS — J45909 Unspecified asthma, uncomplicated: Secondary | ICD-10-CM

## 2018-08-22 DIAGNOSIS — E785 Hyperlipidemia, unspecified: Secondary | ICD-10-CM

## 2018-08-22 DIAGNOSIS — D649 Anemia, unspecified: Secondary | ICD-10-CM

## 2018-08-22 LAB — IRON AND TIBC
IRON: 60 ug/dL (ref 45–182)
Saturation Ratios: 19 % (ref 17.9–39.5)
TIBC: 312 ug/dL (ref 250–450)
UIBC: 252 ug/dL

## 2018-08-22 LAB — FERRITIN: FERRITIN: 122 ng/mL (ref 24–336)

## 2018-08-22 MED ORDER — APIXABAN 5 MG PO TABS: 5.0000 mg | ORAL_TABLET | Freq: Two times a day (BID) | ORAL | Status: DC

## 2018-08-22 MED ORDER — APIXABAN 5 MG PO TABS: 5.0000 mg | ORAL_TABLET | Freq: Two times a day (BID) | ORAL | 0 refills | Status: DC

## 2018-08-22 MED ORDER — ELIQUIS 5 MG VTE STARTER PACK: ORAL_TABLET | ORAL | 0 refills | Status: DC

## 2018-08-22 MED ORDER — APIXABAN 5 MG PO TABS: 10.0000 mg | ORAL_TABLET | Freq: Two times a day (BID) | ORAL | 0 refills | Status: DC

## 2018-08-22 MED ORDER — APIXABAN 5 MG PO TABS: 10.0000 mg | ORAL_TABLET | Freq: Two times a day (BID) | ORAL | Status: DC

## 2018-08-22 MED FILL — ELIQUIS STARTER PACK 5 MG T: 5 | 30 days supply | Qty: 74 | Fill #0

## 2018-08-22 NOTE — Discharge Summary (Signed)
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  Admission date:  08/21/2018  Admitting Physician  Shela Leff, MD  Discharge Date:  08/22/2018   Primary MD  [name], MD  Recommendations for primary care physician for things to follow:  -Patient to follow with vascular surgery as an outpatient no improvement of leg and right lower extremity DVT. -Plavix on hold as he is on Eliquis, Plavix can be resumed when he is off Eliquis -Please check CBC, BMP during next visit   Admission Diagnosis  Bilateral pulmonary embolism (HCC) [I26.99] Acute deep vein thrombosis (DVT) of distal vein of right lower extremity ([place]) [I82.4Z1]   Discharge Diagnosis  Bilateral pulmonary embolism (HCC) [I26.99] Acute deep vein thrombosis (DVT) of distal vein of right lower extremity (HCC) [I82.4Z1]    Principal Problem:   VTE (venous thromboembolism) Active Problems:   Pituitary adenoma (HCC)   CAD (coronary artery disease)   DVT (deep venous thrombosis) (HCC)   Pulmonary embolism (HCC)   CKD (chronic kidney disease) stage 3, GFR 30-59 ml/min (HCC)   Anemia   BPH (benign prostatic hyperplasia)   Anxiety   HTN (hypertension)   HLD (hyperlipidemia)   Atopic asthma      Past Medical History:  Diagnosis Date  . Atrial premature beats   . BPH (benign prostatic hyperplasia)   . BPH (benign prostatic hypertrophy)   . Cataract, right eye   . Chronic low back pain   . Colon polyp   . Coronary artery disease   . DDD (degenerative disc disease), lumbar   . Dermatitis 11/16  . Elevated IgE level 06/30/2018  . Hyperlipidemia   . Insomnia   . Kidney cysts   . Memory loss 2015  . Non-ischemic cardiomyopathy ([place])   . NSTEMI (non-ST elevated myocardial infarction) ([place]) 08/25/2014  . Osteoarthritis of right knee   . Pituitary tumor 2011   macroadenoma w/ VF compromise-then gamma knife a  few years leater for some recurrence  . Positive radioallergosorbent test (RAST) 06/30/2018  . Status post insertion of drug-eluting stent into left anterior descending (LAD) artery 08/28/15   + nuc study.   . Urinary retention   . Vitamin D deficiency 01/2008    Past Surgical History:  Procedure Laterality Date  . ARTHROSCOPIC SURGERY    . BACK SURGERY    . CARDIAC CATHETERIZATION  07/27/2015   Dr Aundra Dubin; oLAD 95%, mLAD 50%, CFX stent OK, OM1 60%, OM2 99%, pPLOM 50%, RCA irregular, AM2 70%, EF 55-60%, no regional wall motion abnormalities.     Marland Kitchen CARDIAC CATHETERIZATION N/A 07/28/2015   Procedure: Left Heart Cath and Coronary Angiography;  Surgeon: [name], MD;  Location: [facility];  Service: Cardiovascular;  Laterality: N/A;  . CARDIAC CATHETERIZATION N/A 07/28/2015   Procedure: Coronary Stent Intervention;  Surgeon: Troy Sine, MD;  Location: [facility];  Service: Cardiovascular;  Laterality: N/A;  . CATARACT EXTRACTION    . CORONARY ANGIOPLASTY WITH  STENT PLACEMENT  07/27/2015   XIENCE ALPINE RX 3.0X33 DES to the LAD, 95%>>0  . HAMMER  BUNION TOE SURGERY    . HAND SURGERY Left   . LEFT HEART CATHETERIZATION WITH CORONARY ANGIOGRAM N/A 08/25/2014   Procedure: LEFT HEART CATHETERIZATION WITH CORONARY ANGIOGRAM;  Surgeon: Burnell Blanks, MD;  [facility];  Service: Cardiovascular;  Laterality: N/A;  . PITUITARY SURGERY     12/01/09 Dr. Wilburn Cornelia and [name].  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  Patient states he fell 2 weeks ago at a football game after being pushed by people.  States his right calf has been swollen since then.  Denies having any pain.  Denies any history of blood clots.  States he is physically very active.  No recent long distance travel.  Also reports having a 58-month history of dyspnea at all times.  Denies having any cough or chest pain.  Denies having any fevers or chills.  Denies having any melena or hematochezia.  Patient was seen by his pulmonologist Dr. Valeta Harms today and noted to have significant right lower extremity swelling and warmth.  Doppler was positive for DVT into the femoral vein.  Patient was advised to come into the hospital.  ED Course: On arrival, afebrile, pulse 59, respiratory rate 16, blood pressure 150/102, and SPO2 97% on room air.  No leukocytosis.  CTA showing small burden of bilateral PE with no evidence of right heart strain.    Hospital Course   Acute bilateral PE/acute right lower extremity DVT -Acute DVT is traumatic, status post fall leg trauma, provoked, will need anticoagulation for 6 months, bilateral PE with no right heart strain, no signs stable, no tachycardia, no hypoxia, ablated in the hallway with no dyspnea, was on overnight during hospital stay, transitioned to Eliquis on discharge -With significant swelling, discussed with vascular Dr. Trula Slade regarding postphlebitic syndrome, if he remains with significant swelling and leg pain in couple days, he can follow with them as an outpatient by Monday, he was given their information and instructed to call if he still having symptoms.  CKD 3 -Stable. Creatinine 1.5, recent baseline 1.2-1.6.  Normocytic  normochromic anemia -Hemoglobin 12.8, was 14.5 a month ago.  Hemoglobin has been as low as 12.3 a year ago.  No signs of active bleeding.  Patient denies having any melena or hematochezia.  History of pituitary adenoma -Continue home Cortef, thyroid hormone replacement therapy  BPH -Continue home alfuzosin, Proscar  Anxiety -Continue home Xanax as needed  Hyperlipidemia -Continue home Zetia, Lovaza  CAD -Stable.  No anginal symptoms.  Continue home Coreg, with Dr. Algernon Huxley, Plavix can be held on Eliquis, and it can be resumed after Eliquis is stopped in 17-month.  Atopic asthma -Stable.  No bronchospasm.  Continue home Breo Ellipta, Claritin, Singulair   Discharge Condition:  Stable   Follow UP  Follow-up Information    [name], MD Follow up in 1 week(s).   Specialty:  Internal Medicine Contact information: [address] [place] [address] [phone]        Serafina Mitchell, MD Follow up.   Specialties:  Vascular Surgery, Cardiology Why:  CALL BY MONDAY Contact information: [place] [address] [phone]             Discharge Instructions  and  Discharge Medications    Discharge Instructions    Discharge instructions   Complete by:  As directed    Follow with Primary MD [name], MD in 7 days   Get CBC, CMP,  checked  by Primary MD next visit.    Activity: As tolerated with Full fall precautions use walker/cane & assistance as needed   Disposition Home    Diet: Heart Healthy   For Heart failure patients - Check your Weight same time everyday, if you gain over 2 pounds, or you develop in leg swelling, experience more shortness of breath or chest pain, call your Primary MD immediately. Follow Cardiac Low Salt Diet and 1.5 lit/day fluid restriction.   On your next visit with your primary care physician please Get Medicines reviewed and adjusted.   Please request your Prim.MD to go over all Hospital Tests  and Procedure/Radiological results at the follow up, please get all Hospital records sent to your Prim MD by signing hospital release before you go home.   If you experience worsening of your admission symptoms, develop shortness of breath, life threatening emergency, suicidal or homicidal thoughts you must seek medical attention immediately by calling 911 or calling your MD immediately  if symptoms less severe.  You Must read complete instructions/literature along with all the possible adverse reactions/side effects for all the Medicines you take and that have been prescribed to you. Take any new Medicines after you have completely understood and accpet all the possible adverse reactions/side effects.   Do not drive, operating heavy machinery, perform activities at heights, swimming or participation in water activities or provide baby sitting services if your were admitted for syncope or siezures until you have seen by Primary MD or a Neurologist and advised to do so again.  Do not drive when taking Pain medications.    Do not take more than prescribed Pain, Sleep and Anxiety Medications  Special Instructions: If you have smoked or chewed Tobacco  in the last 2 yrs please stop smoking, stop any regular Alcohol  and or any Recreational drug use.  Wear Seat belts while driving.   Please note  You were cared for by a hospitalist during your hospital stay. If you have any questions about your discharge medications or the care you received while you were in the hospital after you are discharged, you can call the unit and asked to speak with the hospitalist on call if the hospitalist that took care of you is not available. Once you are discharged, your primary care physician will handle any further medical issues. Please note that NO REFILLS for any discharge medications will be authorized once you are discharged, as it is imperative that you return to your primary care physician (or establish a  relationship with a primary care physician if you do not have one) for your aftercare needs so that they can reassess your need for medications and monitor  your lab values.   Increase activity slowly   Complete by:  As directed      Allergies as of 08/22/2018      Reactions   Sulfa Antibiotics Anaphylaxis, Nausea And Vomiting, Swelling   Sulfamethoxazole Anaphylaxis, Nausea And Vomiting, Swelling   Sulfonamide Derivatives Anaphylaxis, Nausea And Vomiting, Swelling   Bee Venom Rash   Flonase [fluticasone] Other (See Comments)   Caused nose bleeds   Tamsulosin Other (See Comments)   Unknown reaction- Patient doesn't think he is allergic (??)      Medication List    STOP taking these medications   clopidogrel 75 MG tablet Commonly known as:  PLAVIX     TAKE these medications   albuterol 108 (90 Base) MCG/ACT inhaler Commonly known as:  PROVENTIL HFA;VENTOLIN HFA Inhale 2 puffs into the lungs every 6 (six) hours as needed for wheezing or shortness of breath.   alfuzosin 10 MG 24 hr tablet Commonly known as:  UROXATRAL Take 1 tablet (10 mg total) by mouth daily with breakfast. What changed:  when to take this   ALPRAZolam 0.5 MG tablet Commonly known as:  XANAX Take 0.5 mg by mouth as needed for anxiety (before scans).   cabergoline 0.5 MG tablet Commonly known as:  DOSTINEX Take 0.5 mg by mouth every 7 (seven) days.   carvedilol 3.125 MG tablet Commonly known as:  COREG Take 0.5 tablets (1.5625 mg total) by mouth 2 (two) times daily.   CoQ10 200 MG Caps Take 200 mg by mouth daily.   ELIQUIS STARTER PACK 5 MG Tabs Take as directed on package: start with two-5mg  tablets twice daily for 7 days. On day 8, switch to one-5mg  tablet twice daily.   EPIPEN 2-PAK 0.3 mg/0.3 mL Soaj injection Generic drug:  EPINEPHrine Inject 0.3 mg as directed once as needed (for a severe allergic reaction).   ergocalciferol 1.25 MG (50000 UT) capsule Commonly known as:  VITAMIN D2 Take  50,000 Units by mouth 2 (two) times a week.   ezetimibe 10 MG tablet Commonly known as:  ZETIA Take 1 tablet (10 mg total) by mouth daily.   fexofenadine 180 MG tablet Commonly known as:  ALLEGRA Take 1 tablet (180 mg total) by mouth daily.   finasteride 5 MG tablet Commonly known as:  PROSCAR Take 2.5 mg by mouth daily.   fluticasone 50 MCG/ACT nasal spray Commonly known as:  FLONASE Place 2 sprays into both nostrils daily.   fluticasone furoate-vilanterol 200-25 MCG/INH Aepb Commonly known as:  BREO ELLIPTA Inhale 1 puff into the lungs daily.   furosemide 20 MG tablet Commonly known as:  LASIX Take 1 tablet (20 mg total) by mouth daily.   hydrocortisone 20 MG tablet Commonly known as:  CORTEF Take 20 mg by mouth 2 (two) times daily.   levothyroxine 150 MCG tablet Commonly known as:  SYNTHROID, LEVOTHROID Take 150 mcg by mouth at bedtime.   montelukast 10 MG tablet Commonly known as:  SINGULAIR Take 1 tablet (10 mg total) by mouth at bedtime.   MYRBETRIQ 50 MG Tb24 tablet Generic drug:  mirabegron ER Take 50 mg by mouth at bedtime.   nitroGLYCERIN 0.4 MG SL tablet Commonly known as:  NITROSTAT Place 1 tablet (0.4 mg total) under the tongue every 5 (five) minutes as needed for chest pain.   Omega-3 1000 MG Caps Take 1 capsule (1,000 mg total) by mouth 2 (two) times daily.   potassium chloride 10 MEQ tablet Commonly known as:  K-DUR  Take 2 tablets (20 mEq total) by mouth daily. What changed:  how much to take   Red Yeast Rice Extract 600 MG Caps Take 600 mg by mouth daily.   REPATHA 140 MG/ML Sosy Generic drug:  Evolocumab Inject into the skin every 14 (fourteen) days.   tadalafil 5 MG tablet Commonly known as:  CIALIS Take 1 tablet (5 mg total) by mouth daily as needed for erectile dysfunction. Do not take nitro within 48 hrs after taking cialis   testosterone cypionate 200 MG/ML injection Commonly known as:  DEPOTESTOSTERONE CYPIONATE Inject into  the muscle every 21 ( twenty-one) days. 1.62 % injection         Diet and Activity recommendation: See Discharge Instructions above   Consults obtained -  Discussed with cardiology Dr. Algernon Huxley, and vascular surgery Dr. Trula Slade via phone   Major procedures and Radiology Reports - PLEASE review detailed and final reports for all details, in brief -      Ct Angio Chest Pe W And/or Wo Contrast  Result Date: 08/21/2018 CLINICAL DATA:  Right lower extremity DVT. EXAM: CT ANGIOGRAPHY CHEST WITH CONTRAST TECHNIQUE: Multidetector CT imaging of the chest was performed using the standard protocol during bolus administration of intravenous contrast. Multiplanar CT image reconstructions and MIPs were obtained to evaluate the vascular anatomy. CONTRAST:  12mL ISOVUE-370 IOPAMIDOL (ISOVUE-370) INJECTION 76% COMPARISON:  04/17/2018 FINDINGS: Cardiovascular: The heart is upper limits of normal in size for age. No pericardial effusion. There is tortuosity, ectasia and scattered calcification involving the thoracic aorta. Extensive three-vessel coronary artery calcifications. The pulmonary arterial tree is fairly well opacified. There are small bilateral pulmonary emboli. There is some nonocclusive emboli in the right lower lobe pulmonary artery and additionally some secondary branch vessels. Small left-sided pulmonary emboli are also noted. No evidence of right heart strain. Mediastinum/Nodes: No mediastinal or hilar mass or adenopathy. Small scattered lymph nodes are noted. The esophagus is grossly normal. There is a moderate-sized hiatal hernia. Lungs/Pleura: No significant pulmonary findings. No infiltrates or effusions. Upper Abdomen: No significant upper abdominal findings. There are layering gallstones noted in the gallbladder. Musculoskeletal: No significant bony findings. Review of the MIP images confirms the above findings. IMPRESSION: 1. Small burden of bilateral pulmonary emboli. No evidence of right  heart strain. 2. Mild tortuosity and ectasia of the thoracic aorta but no focal aneurysm. 3. No acute pulmonary findings. 4. Three-vessel coronary artery calcifications. 5. Incidental cholelithiasis. Aortic Atherosclerosis (ICD10-I70.0). Electronically Signed   By: Marijo Sanes M.D.   On: 08/21/2018 20:22   Vas [place] Lower Extremity Venous (dvt)  Result Date: 08/22/2018  Lower Venous Study Indications: Swelling, Edema, and Erythema. Other Indications: Patient fell at a football game 2 weeks ago, fell down the                    stairs and landed on his right leg. Since then the leg has                    been getting progressively swollen. Has had shortness of                    breath (chronic) but notices it more recently. Risk Factors: Trauma recent fall on right leg. Performing Technologist: Chesley Noon RVT  Examination Guidelines: A complete evaluation includes B-mode imaging, spectral Doppler, color Doppler, and power Doppler as needed of all accessible portions of each vessel. Bilateral testing is considered an integral part of  a complete examination. Limited examinations for reoccurring indications may be performed as noted.  Right Venous Findings: +---------+---------------+---------+-----------+--------------------+-------+          CompressibilityPhasicitySpontaneityProperties          Summary +---------+---------------+---------+-----------+--------------------+-------+ CFV      Full           Yes      Yes                                    +---------+---------------+---------+-----------+--------------------+-------+ SFJ      Full           Yes      Yes                                    +---------+---------------+---------+-----------+--------------------+-------+ FV Prox  Partial                            spongy w/compressionAcute   +---------+---------------+---------+-----------+--------------------+-------+ FV Mid   None           No       No         rigid  w/compression Acute   +---------+---------------+---------+-----------+--------------------+-------+ FV DistalNone           No       No         rigid w/compression Acute   +---------+---------------+---------+-----------+--------------------+-------+ POP      Partial        No       Yes        spongy w/compressionAcute   +---------+---------------+---------+-----------+--------------------+-------+ PTV                     No       No                                     +---------+---------------+---------+-----------+--------------------+-------+ PERO     None           No       No                                     +---------+---------------+---------+-----------+--------------------+-------+ Gastroc  Full                                                           +---------+---------------+---------+-----------+--------------------+-------+ GSV      Full           Yes      Yes                                    +---------+---------------+---------+-----------+--------------------+-------+ Loosely attached thrombus visualized in the proximal femoral vein.  Right Technical Findings: No flow detected in the posterior tibial or peroneal veins via color/spectral doppler, not well visualized for compressions due to gross edema.  Left Venous Findings: +---------+---------------+---------+-----------+----------+-------+          CompressibilityPhasicitySpontaneityPropertiesSummary +---------+---------------+---------+-----------+----------+-------+  CFV      Full           Yes      Yes                          +---------+---------------+---------+-----------+----------+-------+ SFJ      Full           Yes      Yes                          +---------+---------------+---------+-----------+----------+-------+ FV Prox  Full           Yes      Yes                          +---------+---------------+---------+-----------+----------+-------+ FV Mid   Full            Yes      Yes                          +---------+---------------+---------+-----------+----------+-------+ FV DistalFull           Yes      Yes                          +---------+---------------+---------+-----------+----------+-------+ PFV      Full                                                 +---------+---------------+---------+-----------+----------+-------+ POP      Full           Yes      Yes                          +---------+---------------+---------+-----------+----------+-------+ PTV      Full           Yes      Yes                          +---------+---------------+---------+-----------+----------+-------+ PERO     Full           Yes      Yes                          +---------+---------------+---------+-----------+----------+-------+ Gastroc  Full                                                 +---------+---------------+---------+-----------+----------+-------+ GSV      Full           Yes      Yes                          +---------+---------------+---------+-----------+----------+-------+  Findings reported to Dr. Valeta Harms at 3:45 pm. Patient sent to the [facility] emergency department.  Summary: Right: Findings consistent with acute deep vein thrombosis involving the right femoral vein, right popliteal vein, right posterior tibial vein, and right peroneal vein. Thrombus appears loosely attached at the proximal femoral vein.  No cystic structure found in the popliteal fossa. Left: No evidence of deep vein thrombosis in the lower extremity. No indirect evidence of obstruction proximal to the inguinal ligament. No cystic structure found in the popliteal fossa.  *See table(s) above for measurements and observations.    Preliminary     Micro Results    No results found for this or any previous visit (from the past 240 hour(s)).     Today   Subjective:   Michae Kava today has no headache,no chest abdominal pain,no new weakness  tingling or numbness, feels much better wants to go home today.   Objective:   Blood pressure 102/68, pulse 67, temperature 98 F (36.7 C), temperature source Oral, resp. rate 18, height 6\' 3"  (1.905 m), weight 109.1 kg, SpO2 93 %.   Intake/Output Summary (Last 24 hours) at 08/22/2018 1702 Last data filed at 08/22/2018 0607 Gross per 24 hour  Intake 240 ml  Output 975 ml  Net -735 ml    Exam Awake Alert, Oriented x 3, No new F.N deficits, Normal affect Symmetrical Chest wall movement, Good air movement bilaterally, CTAB RRR,No Gallops,Rubs or new Murmurs, No Parasternal Heave +ve B.Sounds, Abd Soft, Non tender,No rebound -guarding or rigidity. No Cyanosis, right lower extremity +3 edema up to the knee, left lower extremity +1 edema, no erythema or tenderness in either lower ext.  Data Review   CBC w Diff:  Lab Results  Component Value Date   WBC 9.3 08/21/2018   HGB 12.8 (L) 08/21/2018   HCT 40.4 08/21/2018   PLT 274 08/21/2018   LYMPHOPCT 18 08/21/2018   MONOPCT 7 08/21/2018   EOSPCT 3 08/21/2018   BASOPCT 1 08/21/2018    CMP:  Lab Results  Component Value Date   NA 141 08/21/2018   K 4.0 08/21/2018   CL 107 08/21/2018   CO2 26 08/21/2018   BUN 27 (H) 08/21/2018   CREATININE 1.53 (H) 08/21/2018   PROT 6.2 (L) 09/30/2017   ALBUMIN 3.6 09/30/2017   BILITOT 1.1 09/30/2017   ALKPHOS 68 09/30/2017   AST 25 09/30/2017   ALT 29 09/30/2017  .   Total Time in preparing paper work, data evaluation and todays exam - 30 minutes  Phillips Climes M.D on 08/22/2018 at [facility]   Office  [phone]

## 2018-08-22 NOTE — Progress Notes (Signed)
Patient received from ED via wheelchair. Patient is alert and oriented x4. Vital signs are stable. Skin assessment done with another nurse. Iv in place. Patient given instructions about call bell and phone. Bed in low position and call bell in reach.

## 2018-08-22 NOTE — Progress Notes (Signed)
ANTICOAGULATION CONSULT NOTE - Followup Consult  Pharmacy Consult for Eliquis Indication: pulmonary embolus and DVT  Allergies  Allergen Reactions  . Sulfa Antibiotics Anaphylaxis, Nausea And Vomiting and Swelling  . Sulfamethoxazole Anaphylaxis, Nausea And Vomiting and Swelling  . Sulfonamide Derivatives Anaphylaxis, Nausea And Vomiting and Swelling  . Bee Venom Rash  . Flonase [Fluticasone] Other (See Comments)    Caused nose bleeds  . Tamsulosin Other (See Comments)    Unknown reaction- Patient doesn't think he is allergic (??)    Patient Measurements: Height: 6\' 3"  (190.5 cm) Weight: 240 lb 8.4 oz (109.1 kg) IBW/kg (Calculated) : 84.5  Vital Signs: Temp: 98 F (36.7 C) (11/22 0434) Temp Source: Oral (11/22 0434) BP: 102/68 (11/22 0835) Pulse Rate: 67 (11/22 0835)  Labs: Recent Labs    08/21/18 1705  HGB 12.8*  HCT 40.4  PLT 274  APTT 28  LABPROT 14.6  INR 1.15  CREATININE 1.53*    Estimated Creatinine Clearance: 50.5 mL/min (A) (by C-G formula based on SCr of 1.53 mg/dL (H)).   Medical History: Past Medical History:  Diagnosis Date  . Atrial premature beats   . BPH (benign prostatic hyperplasia)   . BPH (benign prostatic hypertrophy)   . Cataract, right eye   . Chronic low back pain   . Colon polyp   . Coronary artery disease   . DDD (degenerative disc disease), lumbar   . Dermatitis 11/16  . Elevated IgE level 06/30/2018  . Hyperlipidemia   . Insomnia   . Kidney cysts   . Memory loss 2015  . Non-ischemic cardiomyopathy ([place])   . NSTEMI (non-ST elevated myocardial infarction) ([place]) 08/25/2014  . Osteoarthritis of right knee   . Pituitary tumor 2011   macroadenoma w/ VF compromise-then gamma knife a few years leater for some recurrence  . Positive radioallergosorbent test (RAST) 06/30/2018  . Status post insertion of drug-eluting stent into left anterior descending (LAD) artery 08/28/15   + nuc study.   . Urinary retention   . Vitamin D  deficiency 01/2008   Assessment: 84 y/o M with progressive leg swelling s/p fall a few weeks ago, outpatient work-up positive for DVT in the femoral vein, CT angio in the ED reveals small burden bilateral PE with no heart strain. Started on enoxaparin for PE and DVT. Hgb 12.8, plts wnl. Transitioning to Eliquis  Goal of Therapy:  Monitor platelets by anticoagulation protocol: Yes   Plan:  Stop enoxaparin Start Eliquis 10mg  PO BID x 7 days, then start 5mg  PO BID Monitor CBC, s/s of bleed  Avonda Toso J 08/22/2018,9:59 AM

## 2018-08-22 NOTE — Discharge Instructions (Signed)
Information on my medicine - ELIQUIS (apixaban)  Why was Eliquis prescribed for you? Eliquis was prescribed to treat blood clots that may have been found in the veins of your legs (deep vein thrombosis) or in your lungs (pulmonary embolism) and to reduce the risk of them occurring again.  What do You need to know about Eliquis ? The starting dose is 10 mg (two 5 mg tablets) taken TWICE daily for the FIRST SEVEN (7) DAYS, then on 11/29  the dose is reduced to ONE 5 mg tablet taken TWICE daily.  Eliquis may be taken with or without food.   Try to take the dose about the same time in the morning and in the evening. If you have difficulty swallowing the tablet whole please discuss with your pharmacist how to take the medication safely.  Take Eliquis exactly as prescribed and DO NOT stop taking Eliquis without talking to the doctor who prescribed the medication.  Stopping may increase your risk of developing a new blood clot.  Refill your prescription before you run out.  After discharge, you should have regular check-up appointments with your healthcare provider that is prescribing your Eliquis.    What do you do if you miss a dose? If a dose of ELIQUIS is not taken at the scheduled time, take it as soon as possible on the same day and twice-daily administration should be resumed. The dose should not be doubled to make up for a missed dose.  Important Safety Information A possible side effect of Eliquis is bleeding. You should call your healthcare provider right away if you experience any of the following: ? Bleeding from an injury or your nose that does not stop. ? Unusual colored urine (red or dark brown) or unusual colored stools (red or black). ? Unusual bruising for unknown reasons. ? A serious fall or if you hit your head (even if there is no bleeding).  Some medicines may interact with Eliquis and might increase your risk of bleeding or clotting while on Eliquis. To help avoid  this, consult your healthcare provider or pharmacist prior to using any new prescription or non-prescription medications, including herbals, vitamins, non-steroidal anti-inflammatory drugs (NSAIDs) and supplements.  This website has more information on Eliquis (apixaban): http://[email or web address].com/eliquis/home

## 2018-08-31 ENCOUNTER — Ambulatory Visit (HOSPITAL_BASED_OUTPATIENT_CLINIC_OR_DEPARTMENT_OTHER): Payer: Medicare Other | Attending: Cardiology | Admitting: Cardiology

## 2018-08-31 VITALS — Ht 77.0 in | Wt 240.0 lb

## 2018-08-31 DIAGNOSIS — G4731 Primary central sleep apnea: Secondary | ICD-10-CM | POA: Diagnosis not present

## 2018-08-31 DIAGNOSIS — G4733 Obstructive sleep apnea (adult) (pediatric): Secondary | ICD-10-CM

## 2018-08-31 DIAGNOSIS — I429 Cardiomyopathy, unspecified: Secondary | ICD-10-CM

## 2018-08-31 DIAGNOSIS — I5032 Chronic diastolic (congestive) heart failure: Secondary | ICD-10-CM | POA: Diagnosis not present

## 2018-08-31 DIAGNOSIS — R0683 Snoring: Secondary | ICD-10-CM | POA: Diagnosis present

## 2018-08-31 DIAGNOSIS — E669 Obesity, unspecified: Secondary | ICD-10-CM | POA: Diagnosis not present

## 2018-08-31 DIAGNOSIS — I491 Atrial premature depolarization: Secondary | ICD-10-CM | POA: Insufficient documentation

## 2018-08-31 DIAGNOSIS — Z6830 Body mass index (BMI) 30.0-30.9, adult: Secondary | ICD-10-CM | POA: Insufficient documentation

## 2018-09-01 ENCOUNTER — Encounter: Payer: Self-pay | Admitting: Cardiology

## 2018-09-01 DIAGNOSIS — E291 Testicular hypofunction: Secondary | ICD-10-CM | POA: Diagnosis not present

## 2018-09-01 DIAGNOSIS — D352 Benign neoplasm of pituitary gland: Secondary | ICD-10-CM | POA: Diagnosis not present

## 2018-09-01 DIAGNOSIS — I82401 Acute embolism and thrombosis of unspecified deep veins of right lower extremity: Secondary | ICD-10-CM | POA: Diagnosis not present

## 2018-09-01 DIAGNOSIS — I2510 Atherosclerotic heart disease of native coronary artery without angina pectoris: Secondary | ICD-10-CM | POA: Diagnosis not present

## 2018-09-01 DIAGNOSIS — I2699 Other pulmonary embolism without acute cor pulmonale: Secondary | ICD-10-CM | POA: Diagnosis not present

## 2018-09-01 DIAGNOSIS — J45909 Unspecified asthma, uncomplicated: Secondary | ICD-10-CM | POA: Diagnosis not present

## 2018-09-01 DIAGNOSIS — N183 Chronic kidney disease, stage 3 (moderate): Secondary | ICD-10-CM | POA: Diagnosis not present

## 2018-09-01 DIAGNOSIS — D649 Anemia, unspecified: Secondary | ICD-10-CM | POA: Diagnosis not present

## 2018-09-01 DIAGNOSIS — E7849 Other hyperlipidemia: Secondary | ICD-10-CM | POA: Diagnosis not present

## 2018-09-01 DIAGNOSIS — N401 Enlarged prostate with lower urinary tract symptoms: Secondary | ICD-10-CM | POA: Diagnosis not present

## 2018-09-01 DIAGNOSIS — F419 Anxiety disorder, unspecified: Secondary | ICD-10-CM | POA: Diagnosis not present

## 2018-09-02 NOTE — Procedures (Signed)
   Patient Name: [name], Olsen Date: 08/31/2018 Gender: Male D.O.B: 01/20/1934 Age (years): 55 Referring Provider: [name] Height (inches): 53 Interpreting Physician: Fransico Him MD, ABSM Weight (lbs): 240 RPSGT: Baxter Flattery BMI: 30 MRN: [id number] Neck Size: 18.00  CLINICAL INFORMATION  Sleep Study Type: NPSG  Indication for sleep study: Obesity, Snoring, Witnesses Apnea / Gasping During Sleep  Epworth Sleepiness Score:  SLEEP STUDY TECHNIQUE  As per the AASM Manual for the Scoring of Sleep and Associated Events v2.3 (April 2016) with a hypopnea requiring 4% desaturations. The channels recorded and monitored were frontal, central and occipital EEG, electrooculogram (EOG), submentalis EMG (chin), nasal and oral airflow, thoracic and abdominal wall motion, anterior tibialis EMG, snore microphone, electrocardiogram, and pulse oximetry.  MEDICATIONS  Medications self-administered by patient taken the night of the study : N/A  SLEEP ARCHITECTURE  The study was initiated at 10:29:22 PM and ended at 2:10:52 AM. Sleep onset time was 18.6 minutes and the sleep efficiency was 5.6%%. The total sleep time was 12.5 minutes. Stage REM latency was N/A minutes. The patient spent 64.0%% of the night in stage N1 sleep, 36.0%% in stage N2 sleep, 0.0%% in stage N3 and 0% in REM. Alpha intrusion was absent. Supine sleep was 8.00%.  RESPIRATORY PARAMETERS  The overall apnea/hypopnea index (AHI) was 57.6 per hour. There were 11 total apneas, including 5 obstructive, 6 central and 0 mixed apneas. There were 1 hypopneas and 0 RERAs. The AHI during Stage REM sleep was N/A per hour. AHI while supine was 0.0 per hour. The mean oxygen saturation was 94.4%. The minimum SpO2 during sleep was 92.0%. moderate snoring was noted during this study.  CARDIAC DATA  The 2 lead EKG demonstrated sinus rhythm. The mean heart rate was 51.1 beats per minute. Other EKG findings include: PACs.  LEG  MOVEMENT DATA  The total PLMS were 0 with a resulting PLMS index of 0.0. Associated arousal with leg movement index was 0.0 .  IMPRESSIONS  - Severe obstructive sleep apnea occurred during this study (AHI = 57.6/h). - Moderate central sleep apnea occurred during this study (CAI = 28.8/h). - The patient had minimal or no oxygen desaturation during the study (Min O2 = 92.0%) - The patient snored with moderate snoring volume. - EKG findings include PACs. - Clinically significant periodic limb movements did not occur during sleep. No significant associated arousals.  DIAGNOSIS  - Obstructive Sleep Apnea (327.23 [G47.33 ICD-10]) - Central Sleep Apnea (327.27 [G47.37 ICD-10])  RECOMMENDATIONS  - Since patient only had 12 minutes of sleep time, would like to verify results with a home sleep study.  Will likely need CPAP titration to determine optimal pressure required to alleviate sleep disordered breathing. BiPAP or ASV titration may be required to eliminate central sleep apnea. - Avoid alcohol, sedatives and other CNS depressants that may worsen sleep apnea and disrupt normal sleep architecture. - Sleep hygiene should be reviewed to assess factors that may improve sleep quality. - Weight management and regular exercise should be initiated or continued if appropriate.  [Electronically signed] 09/02/2018 10:00 PM  Fransico Him MD, ABSM Diplomate, American Board of Sleep Medicine

## 2018-09-04 ENCOUNTER — Encounter: Payer: Self-pay | Admitting: Podiatry

## 2018-09-04 ENCOUNTER — Ambulatory Visit (INDEPENDENT_AMBULATORY_CARE_PROVIDER_SITE_OTHER): Payer: Medicare Other | Admitting: Podiatry

## 2018-09-04 VITALS — BP 141/95 | HR 83 | Resp 16

## 2018-09-04 DIAGNOSIS — L03031 Cellulitis of right toe: Secondary | ICD-10-CM

## 2018-09-04 DIAGNOSIS — G479 Sleep disorder, unspecified: Secondary | ICD-10-CM | POA: Insufficient documentation

## 2018-09-04 DIAGNOSIS — J3489 Other specified disorders of nose and nasal sinuses: Secondary | ICD-10-CM | POA: Insufficient documentation

## 2018-09-04 DIAGNOSIS — I2510 Atherosclerotic heart disease of native coronary artery without angina pectoris: Secondary | ICD-10-CM

## 2018-09-04 NOTE — Patient Instructions (Signed)

## 2018-09-04 NOTE — Progress Notes (Signed)
Subjective:  Patient ID: [name], [name], male    DOB: 1934-03-27,  MRN: [id number] HPI Chief Complaint  Patient presents with  . Toe Pain    Hallux right - toe red and swollen x few days, PCP rx'd doxy and mupirocin, not any better but only been taking antibiotic for 4 days, doesn't remember an injury, does wear compression stockings  . New Patient (Initial Visit)    84 y.o. male presents with the above complaint.   ROS: Denies fever chills nausea vomiting muscle aches pains calf pain back pain chest pain shortness of breath.  Past Medical History:  Diagnosis Date  . Atrial premature beats   . BPH (benign prostatic hyperplasia)   . BPH (benign prostatic hypertrophy)   . Cataract, right eye   . Chronic low back pain   . Colon polyp   . Coronary artery disease   . DDD (degenerative disc disease), lumbar   . Dermatitis 11/16  . Elevated IgE level 06/30/2018  . Hyperlipidemia   . Insomnia   . Kidney cysts   . Memory loss 2015  . Non-ischemic cardiomyopathy ([place])   . NSTEMI (non-ST elevated myocardial infarction) ([place]) 08/25/2014  . Osteoarthritis of right knee   . Pituitary tumor 2011   macroadenoma w/ VF compromise-then gamma knife a few years leater for some recurrence  . Positive radioallergosorbent test (RAST) 06/30/2018  . Status post insertion of drug-eluting stent into left anterior descending (LAD) artery 08/28/15   + nuc study.   . Urinary retention   . Vitamin D deficiency 01/2008   Past Surgical History:  Procedure Laterality Date  . ARTHROSCOPIC SURGERY    . BACK SURGERY    . CARDIAC CATHETERIZATION  07/27/2015   Dr Aundra Dubin; oLAD 95%, mLAD 50%, CFX stent OK, OM1 60%, OM2 99%, pPLOM 50%, RCA irregular, AM2 70%, EF 55-60%, no regional wall motion abnormalities.     Marland Kitchen CARDIAC CATHETERIZATION N/A 07/28/2015   Procedure: Left Heart Cath and Coronary Angiography;  Surgeon: [name], MD;  Location: [facility];  Service: Cardiovascular;  Laterality: N/A;   . CARDIAC CATHETERIZATION N/A 07/28/2015   Procedure: Coronary Stent Intervention;  Surgeon: Troy Sine, MD;  Location: [facility];  Service: Cardiovascular;  Laterality: N/A;  . CATARACT EXTRACTION    . CORONARY ANGIOPLASTY WITH STENT PLACEMENT  07/27/2015   XIENCE ALPINE RX 3.0X33 DES to the LAD, 95%>>0  . HAMMER  BUNION TOE SURGERY    . HAND SURGERY Left   . LEFT HEART CATHETERIZATION WITH CORONARY ANGIOGRAM N/A 08/25/2014   Procedure: LEFT HEART CATHETERIZATION WITH CORONARY ANGIOGRAM;  Surgeon: Burnell Blanks, MD;  Location[facility];  Service: Cardiovascular;  Laterality: N/A;  . PITUITARY SURGERY     12/01/09 Dr. Wilburn Cornelia and [name].  Marland Kitchen RIGHT/LEFT HEART CATH AND CORONARY ANGIOGRAPHY N/A 03/19/2018   Procedure: RIGHT/LEFT HEART CATH AND CORONARY ANGIOGRAPHY;  Surgeon: [name], MD;  Location: [facility];  Service: Cardiovascular;  Laterality: N/A;    Current Outpatient Medications:  .  albuterol (PROVENTIL HFA;VENTOLIN HFA) 108 (90 Base) MCG/ACT inhaler, Inhale 2 puffs into the lungs every 6 (six) hours as needed for wheezing or shortness of breath., Disp: 1 Inhaler, Rfl: 6 .  alfuzosin (UROXATRAL) 10 MG 24 hr tablet, Take 1 tablet (10 mg total) by mouth daily with breakfast. (Patient taking differently: Take 10 mg by mouth at bedtime. ), Disp: 30 tablet, Rfl: 2 .  ALPRAZolam (  XANAX) 0.5 MG tablet, Take 0.5 mg by mouth as needed for anxiety (before scans). , Disp: , Rfl:  .  cabergoline (DOSTINEX) 0.5 MG tablet, Take 0.5 mg by mouth every 7 (seven) days. , Disp: , Rfl: 2 .  carvedilol (COREG) 3.125 MG tablet, Take 0.5 tablets (1.5625 mg total) by mouth 2 (two) times daily., Disp: 45 tablet, Rfl: 3 .  Coenzyme Q10 (COQ10) 200 MG CAPS, Take 200 mg by mouth daily. , Disp: , Rfl:  .  doxycycline (VIBRAMYCIN) 100 MG capsule, TAKE 1 CAPSULE BY MOUTH BID FOR 7 DAYS, Disp: , Rfl: 0 .  ELIQUIS STARTER PACK (ELIQUIS STARTER PACK) 5 MG TABS, Take as  directed on package: start with two-5mg  tablets twice daily for 7 days. On day 8, switch to one-5mg  tablet twice daily., Disp: 1 each, Rfl: 0 .  EPIPEN 2-PAK 0.3 MG/0.3ML SOAJ injection, Inject 0.3 mg as directed once as needed (for a severe allergic reaction). , Disp: , Rfl: 0 .  ergocalciferol (VITAMIN D2) 50000 UNITS capsule, Take 50,000 Units by mouth 2 (two) times a week. , Disp: , Rfl:  .  Evolocumab (REPATHA) 140 MG/ML SOSY, Inject into the skin every 14 (fourteen) days., Disp: , Rfl:  .  ezetimibe (ZETIA) 10 MG tablet, Take 1 tablet (10 mg total) by mouth daily., Disp: 30 tablet, Rfl: 3 .  fexofenadine (ALLEGRA ALLERGY) 180 MG tablet, Take 1 tablet (180 mg total) by mouth daily., Disp: , Rfl:  .  finasteride (PROSCAR) 5 MG tablet, Take 2.5 mg by mouth daily. , Disp: , Rfl:  .  FLOVENT HFA 110 MCG/ACT inhaler, , Disp: , Rfl: 4 .  fluticasone (FLONASE) 50 MCG/ACT nasal spray, Place 2 sprays into both nostrils daily. (Patient not taking: Reported on 08/21/2018), Disp: 16 g, Rfl: 2 .  fluticasone furoate-vilanterol (BREO ELLIPTA) 200-25 MCG/INH AEPB, Inhale 1 puff into the lungs daily., Disp: 1 each, Rfl: 0 .  furosemide (LASIX) 20 MG tablet, Take 1 tablet (20 mg total) by mouth daily., Disp: 90 tablet, Rfl: 3 .  hydrocortisone (CORTEF) 20 MG tablet, Take 20 mg by mouth 2 (two) times daily., Disp: , Rfl:  .  levothyroxine (SYNTHROID, LEVOTHROID) 150 MCG tablet, Take 150 mcg by mouth at bedtime. , Disp: , Rfl:  .  montelukast (SINGULAIR) 10 MG tablet, Take 1 tablet (10 mg total) by mouth at bedtime., Disp: 30 tablet, Rfl: 11 .  mupirocin ointment (BACTROBAN) 2 %, APPLY TO THE AFFECTED AREA TID, Disp: , Rfl: 3 .  MYRBETRIQ 50 MG TB24 tablet, Take 50 mg by mouth at bedtime. , Disp: , Rfl: 0 .  nitroGLYCERIN (NITROSTAT) 0.4 MG SL tablet, Place 1 tablet (0.4 mg total) under the tongue every 5 (five) minutes as needed for chest pain., Disp: 90 tablet, Rfl: 3 .  Omega-3 1000 MG CAPS, Take 1 capsule  (1,000 mg total) by mouth 2 (two) times daily., Disp: 60 capsule, Rfl: 11 .  potassium chloride (K-DUR) 10 MEQ tablet, Take 2 tablets (20 mEq total) by mouth daily. (Patient taking differently: Take 10 mEq by mouth daily. ), Disp: 180 tablet, Rfl: 3 .  Red Yeast Rice Extract 600 MG CAPS, Take 600 mg by mouth daily., Disp: , Rfl:  .  tadalafil (CIALIS) 5 MG tablet, Take 1 tablet (5 mg total) by mouth daily as needed for erectile dysfunction. Do not take nitro within 48 hrs after taking cialis (Patient not taking: Reported on 08/21/2018), Disp: 10 tablet, Rfl: 1 .  testosterone cypionate (DEPOTESTOTERONE CYPIONATE) 200 MG/ML injection, Inject into the muscle every 21 ( twenty-one) days. 1.62 % injection, Disp: , Rfl:   Allergies  Allergen Reactions  . Sulfa Antibiotics Anaphylaxis, Nausea And Vomiting and Swelling  . Sulfamethoxazole Anaphylaxis, Nausea And Vomiting and Swelling  . Sulfonamide Derivatives Anaphylaxis, Nausea And Vomiting and Swelling  . Bee Venom Rash  . Flonase [Fluticasone] Other (See Comments)    Caused nose bleeds  . Tamsulosin Other (See Comments)    Unknown reaction- Patient doesn't think he is allergic (??)   Review of Systems Objective:   Vitals:   09/04/18 1518  BP: (!) 141/95  Pulse: 83  Resp: 16    General: Well developed, nourished, in no acute distress, alert and oriented x3   Dermatological: Skin is warm, dry and supple bilateral. Nails x 10 are well maintained; remaining integument appears unremarkable at this time. There are no open sores, no preulcerative lesions, no rash or signs of infection present.  Subungual hematoma with mild proximal cellulitis.  More than likely associated with compression hose irritating the tip of the toe.  Vascular: Dorsalis Pedis artery and Posterior Tibial artery pedal pulses are 2/4 bilateral with immedate capillary fill time. Pedal hair growth present. No varicosities and no lower extremity edema present bilateral.    Neruologic: Grossly intact via light touch bilateral. Vibratory intact via tuning fork bilateral. Protective threshold with Semmes Wienstein monofilament intact to all pedal sites bilateral. Patellar and Achilles deep tendon reflexes 2+ bilateral. No Babinski or clonus noted bilateral.   Musculoskeletal: No gross boney pedal deformities bilateral. No pain, crepitus, or limitation noted with foot and ankle range of motion bilateral. Muscular strength 5/5 in all groups tested bilateral.  Gait: Unassisted, Nonantalgic.    Radiographs:  None taken  Assessment & Plan:   Assessment: Subungual hematoma with proximal cellulitis proximal nail fold not past the hallux interphalangeal joint right great toe.  Plan: Total nail avulsion with excision of necrotic tissue which was performed after local anesthesia was administered.  He tolerated procedure well after typical sterile Betadine skin prep.  The nail was avulsed all necrotic tissue was removed placed Surgicel to prevent bleeding and a dry sterile compressive dressing.  He was provided with both oral and written home-going instructions for soaking tomorrow he will also start applying the mupirocin cream.     Stanislaus Kaltenbach T. [place], [place]

## 2018-09-08 ENCOUNTER — Telehealth: Payer: Self-pay | Admitting: *Deleted

## 2018-09-08 NOTE — Telephone Encounter (Signed)
-----   Message from Sueanne Margarita, MD sent at 09/02/2018 10:04 PM EST ----- Please let patient know that they have sleep apnea but since he had very little sleep time I would like to get a home sleep study to verify the results. Please order

## 2018-09-08 NOTE — Telephone Encounter (Signed)
Informed patient of sleep study results and patient understanding was verbalized. Patient understands his sleep study showed they have sleep apnea but since he had very little sleep time I would like to get a home sleep study to verify the results. Patient states he did not sleep any and feels the sleep study was a total waste of his time.  Patient states he is not interested in taking any more sleep studies and he thanked me for calling.

## 2018-09-09 DIAGNOSIS — R300 Dysuria: Secondary | ICD-10-CM | POA: Diagnosis not present

## 2018-09-09 NOTE — Telephone Encounter (Signed)
Please let patient know that since he was not able to sleep but 12 minutes I would like to do the home sleep study to be a more reliable test

## 2018-09-15 NOTE — Telephone Encounter (Signed)
[place] patient only slept for 12 minutes but appeared to have OSA.  I recommended a home sleep study and he refused and said he was not interested in going any further with dx or treatment

## 2018-09-15 NOTE — Telephone Encounter (Signed)
Per dpr Erick Blinks states the patient refuses to do a home sleep test but if he changes his mind they will call out office to schedule.

## 2018-09-17 DIAGNOSIS — Z85828 Personal history of other malignant neoplasm of skin: Secondary | ICD-10-CM | POA: Diagnosis not present

## 2018-09-17 DIAGNOSIS — L570 Actinic keratosis: Secondary | ICD-10-CM | POA: Diagnosis not present

## 2018-09-17 DIAGNOSIS — L821 Other seborrheic keratosis: Secondary | ICD-10-CM | POA: Diagnosis not present

## 2018-09-17 DIAGNOSIS — D1801 Hemangioma of skin and subcutaneous tissue: Secondary | ICD-10-CM | POA: Diagnosis not present

## 2018-09-19 ENCOUNTER — Ambulatory Visit (INDEPENDENT_AMBULATORY_CARE_PROVIDER_SITE_OTHER): Payer: Medicare Other

## 2018-09-19 DIAGNOSIS — L03031 Cellulitis of right toe: Secondary | ICD-10-CM

## 2018-09-19 NOTE — Patient Instructions (Signed)

## 2018-09-29 ENCOUNTER — Telehealth: Payer: Self-pay | Admitting: Pulmonary Disease

## 2018-09-29 NOTE — Telephone Encounter (Signed)
Called and spoke with patient, he stated that he thought he was suppose to have a follow up appointment but was not sure if he needed to make one. Below are instructions from last OV   Return in about 6 weeks (around 10/02/2018), or if symptoms worsen or fail to improve.     Instructions      Return in about 6 weeks (around 10/02/2018), or if symptoms worsen or fail to improve.  Thank you for visiting Dr. Valeta Harms at [facility]. Today we recommend the following:      ______________________________________________________________________________________________  Visit has been scheduled. Nothing further needed.

## 2018-10-07 DIAGNOSIS — E291 Testicular hypofunction: Secondary | ICD-10-CM | POA: Diagnosis not present

## 2018-10-08 NOTE — Progress Notes (Signed)
[name] is here today for his follow-up appointment, recent procedure performed on 09/04/2018, removal of right hallux ingrown nail.  He states that he is not having any pain, and he thinks that it is healing up well.  No redness, no erythema, no swelling, no drainage, no other signs and symptoms of infection.  The area is scabbing at this time and appears to be healing well.  Discussed signs and symptoms of infection with patient, verbal and written instructions were given.  He is to follow-up as needed with any acute symptom changes.

## 2018-10-09 ENCOUNTER — Ambulatory Visit (INDEPENDENT_AMBULATORY_CARE_PROVIDER_SITE_OTHER): Payer: Medicare Other | Admitting: Pulmonary Disease

## 2018-10-09 ENCOUNTER — Encounter: Payer: Self-pay | Admitting: Pulmonary Disease

## 2018-10-09 VITALS — BP 118/82 | HR 65 | Ht 75.0 in | Wt 239.2 lb

## 2018-10-09 DIAGNOSIS — I8290 Acute embolism and thrombosis of unspecified vein: Secondary | ICD-10-CM | POA: Diagnosis not present

## 2018-10-09 DIAGNOSIS — J4520 Mild intermittent asthma, uncomplicated: Secondary | ICD-10-CM

## 2018-10-09 DIAGNOSIS — I2694 Multiple subsegmental pulmonary emboli without acute cor pulmonale: Secondary | ICD-10-CM | POA: Diagnosis not present

## 2018-10-09 DIAGNOSIS — I82411 Acute embolism and thrombosis of right femoral vein: Secondary | ICD-10-CM

## 2018-10-09 MED ORDER — BUDESONIDE-FORMOTEROL FUMARATE 160-4.5 MCG/ACT IN AERO: 2.0000 | INHALATION_SPRAY | Freq: Two times a day (BID) | RESPIRATORY_TRACT | 0 refills | Status: DC

## 2018-10-09 NOTE — Progress Notes (Signed)
Synopsis: Referred in September 2019 for shortness of breath by [name], MD  Subjective:   PATIENT ID: [name], [name] GENDER: male DOB: 10/11/1933, MRN: [id number]  Chief Complaint  Patient presents with  . Follow-up    At last visit patient was sent to ED. Positive for blood clot. Currently taking eliquis. States his breathing remains the same at his baseline. Currenlty he uses Flovent daily and albuterol PRN.     PMH of cardiomyopathy, MI, stent placement, CAD, HLD, follows with Dr. Aundra Dubin. 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 Past 6 months worsening SOB. 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 He goes to the gym every day for an 1 hour. He never wheezes or has chest tightness. He feels like he runs out of breath. He was on allergy shots for a long time. He associates some of this of breath starting around the time he quit allergy shots back in June. Since shortness of breath with exertion.  He is able to go to the gym and walk for nearly 30 minutes without stopping.  He is able to get his heart rate up into the low 100s with exercise.  Patient denies chest pain, nausea vomiting, cough with sputum production. He does have nocturnal dyspnea.  He wakes up in the middle of the night feeling short of breath and will have to sit in the den or in his office upright for a few hours before he feels comfortable enough to lay flat again.  OV 08/21/2018: Positive RAST, positive IgE 215, our lab results returned from last office visit.  Since he was last seen he does feel as if his dyspnea on exertion may be a little bit better and his nocturnal symptoms are little better.  He still getting up in the middle of the night feeling short of breath.  He has not been having any episodes of wheezing.  He does notice that his posterior nasal drip symptoms may be slightly worse in the morning.  He still  continually have to clear his sinus passages and throat in the morning.  Feel as if his hoarseness of voice is slightly worse.  He has not been rinsing out his mouth after using his inhaler.  2 weeks ago at a football game at [facility] he fell down the stairs and landed on his right leg.  Since then the leg has been progressively swollen.  At this point he complains of calf pain with exertion.  The swelling is getting larger.  At this point his right leg is two thirds the size of his left leg.  He does have increased swelling in the legs as compared to his baseline  OV 10/09/2018: Doing well today in the office.  He does feel a little more dyspnea on exertion which is at his baseline.  He is concerned about his low heart rate.  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  He carries to 5 gallon buckets of water to all of his sheds/buildings that he stores his [place] in.  He has to keep up with his game birds.  Otherwise, he did try Breo for couple of days.  Was not really sure it made any difference so he went back to taking his Flovent.  However this was during the time that  he was being treated for his newly diagnosed right lower extremity VTE and presumed PE.  Currently on Eliquis for this.   Past Medical History:  Diagnosis Date  . Atrial premature beats   . BPH (benign prostatic hyperplasia)   . BPH (benign prostatic hypertrophy)   . Cataract, right eye   . Chronic low back pain   . Colon polyp   . Coronary artery disease   . DDD (degenerative disc disease), lumbar   . Dermatitis 11/16  . Elevated IgE level 06/30/2018  . Hyperlipidemia   . Insomnia   . Kidney cysts   . Memory loss 2015  . Non-ischemic cardiomyopathy ([place])   . NSTEMI (non-ST elevated myocardial infarction) ([place]) 08/25/2014  . Osteoarthritis of right knee   . Pituitary tumor 2011   macroadenoma w/ VF compromise-then gamma knife a few years leater for some recurrence   . Positive radioallergosorbent test (RAST) 06/30/2018  . Status post insertion of drug-eluting stent into left anterior descending (LAD) artery 08/28/15   + nuc study.   . Urinary retention   . Vitamin D deficiency 01/2008     Family History  Problem Relation Age of Onset  . Congestive Heart Failure Father 52  . Other Mother 56  . Coronary artery disease Brother   . Other Sister 87       polio  . Heart attack Brother        MI/ASCAD 2006  . COPD Sister 23     Past Surgical History:  Procedure Laterality Date  . ARTHROSCOPIC SURGERY    . BACK SURGERY    . CARDIAC CATHETERIZATION  07/27/2015   Dr Aundra Dubin; oLAD 95%, mLAD 50%, CFX stent OK, OM1 60%, OM2 99%, pPLOM 50%, RCA irregular, AM2 70%, EF 55-60%, no regional wall motion abnormalities.     Marland Kitchen CARDIAC CATHETERIZATION N/A 07/28/2015   Procedure: Left Heart Cath and Coronary Angiography;  Surgeon: [name], MD;  Location: [place] [facility];  Service: Cardiovascular;  Laterality: N/A;  . CARDIAC CATHETERIZATION N/A 07/28/2015   Procedure: Coronary Stent Intervention;  Surgeon: Troy Sine, MD;  Location: [place] [facility];  Service: Cardiovascular;  Laterality: N/A;  . CATARACT EXTRACTION    . CORONARY ANGIOPLASTY WITH STENT PLACEMENT  07/27/2015   XIENCE ALPINE RX 3.0X33 DES to the LAD, 95%>>0  . HAMMER  BUNION TOE SURGERY    . HAND SURGERY Left   . LEFT HEART CATHETERIZATION WITH CORONARY ANGIOGRAM N/A 08/25/2014   Procedure: LEFT HEART CATHETERIZATION WITH CORONARY ANGIOGRAM;  Surgeon: Burnell Blanks, MD;  Location: [facility];  Service: Cardiovascular;  Laterality: N/A;  . PITUITARY SURGERY     12/01/09 Dr. Wilburn Cornelia and [name].  Marland Kitchen RIGHT/LEFT HEART CATH AND CORONARY ANGIOGRAPHY N/A 03/19/2018   Procedure: RIGHT/LEFT HEART CATH AND CORONARY ANGIOGRAPHY;  Surgeon: [name], MD;  Location: [place] [facility];  Service: Cardiovascular;  Laterality: N/A;    Social History   Socioeconomic History   . Marital status: Married    Spouse name: [name]  . Number of children: 2  . Years of education: College  . Highest education level: Not on file  Occupational History  . Occupation: retired Programmer, multimedia: RETIRED  Social Needs  . Financial resource strain: Not on file  . Food insecurity:    Worry: Not on file    Inability: Not on file  . Transportation needs:    Medical: Not on file  Non-medical: Not on file  Tobacco Use  . Smoking status: Never Smoker  . Smokeless tobacco: Never Used  Substance and Sexual Activity  . Alcohol use: Yes    Comment: occas.  . Drug use: No  . Sexual activity: Not on file  Lifestyle  . Physical activity:    Days per week: Not on file    Minutes per session: Not on file  . Stress: Not on file  Relationships  . Social connections:    Talks on phone: Not on file    Gets together: Not on file    Attends religious service: Not on file    Active member of club or organization: Not on file    Attends meetings of clubs or organizations: Not on file    Relationship status: Not on file  . Intimate partner violence:    Fear of current or ex partner: Not on file    Emotionally abused: Not on file    Physically abused: Not on file    Forced sexual activity: Not on file  Other Topics Concern  . Not on file  Social History Narrative   Patient is married [name]).   Patient has two children.   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   Patient is right-handed.   Patient does not drink any caffeine.     Allergies  Allergen Reactions  . Sulfa Antibiotics Anaphylaxis, Nausea And Vomiting and Swelling  . Sulfamethoxazole Anaphylaxis, Nausea And Vomiting and Swelling  . Sulfonamide Derivatives Anaphylaxis, Nausea And Vomiting and Swelling  . Bee Venom Rash  . Flonase [Fluticasone] Other (See Comments)    Caused nose bleeds  . Tamsulosin Other (See Comments)    Unknown reaction- Patient doesn't think he is  allergic (??)     Outpatient Medications Prior to Visit  Medication Sig Dispense Refill  . albuterol (PROVENTIL HFA;VENTOLIN HFA) 108 (90 Base) MCG/ACT inhaler Inhale 2 puffs into the lungs every 6 (six) hours as needed for wheezing or shortness of breath. 1 Inhaler 6  . alfuzosin (UROXATRAL) 10 MG 24 hr tablet Take 1 tablet (10 mg total) by mouth daily with breakfast. (Patient taking differently: Take 10 mg by mouth at bedtime. ) 30 tablet 2  . ALPRAZolam (XANAX) 0.5 MG tablet Take 0.5 mg by mouth as needed for anxiety (before scans).     . cabergoline (DOSTINEX) 0.5 MG tablet Take 0.5 mg by mouth every 7 (seven) days.   2  . carvedilol (COREG) 3.125 MG tablet Take 0.5 tablets (1.5625 mg total) by mouth 2 (two) times daily. 45 tablet 3  . Coenzyme Q10 (COQ10) 200 MG CAPS Take 200 mg by mouth daily.     Marland Kitchen ELIQUIS STARTER PACK (ELIQUIS STARTER PACK) 5 MG TABS Take as directed on package: start with two-5mg  tablets twice daily for 7 days. On day 8, switch to one-5mg  tablet twice daily. 1 each 0  . EPIPEN 2-PAK 0.3 MG/0.3ML SOAJ injection Inject 0.3 mg as directed once as needed (for a severe allergic reaction).   0  . ergocalciferol (VITAMIN D2) 50000 UNITS capsule Take 50,000 Units by mouth 2 (two) times a week.     . Evolocumab (REPATHA) 140 MG/ML SOSY Inject into the skin every 14 (fourteen) days.    Marland Kitchen ezetimibe (ZETIA) 10 MG tablet Take 1 tablet (10 mg total) by mouth daily. 30 tablet 3  . fexofenadine (ALLEGRA ALLERGY) 180 MG tablet Take 1 tablet (180 mg total) by mouth  daily.    . finasteride (PROSCAR) 5 MG tablet Take 2.5 mg by mouth daily.     Marland Kitchen FLOVENT HFA 110 MCG/ACT inhaler   4  . fluticasone (FLONASE) 50 MCG/ACT nasal spray Place 2 sprays into both nostrils daily. 16 g 2  . furosemide (LASIX) 20 MG tablet Take 1 tablet (20 mg total) by mouth daily. 90 tablet 3  . hydrocortisone (CORTEF) 20 MG tablet Take 20 mg by mouth 2 (two) times daily.    Marland Kitchen levothyroxine (SYNTHROID, LEVOTHROID)  150 MCG tablet Take 150 mcg by mouth at bedtime.     . montelukast (SINGULAIR) 10 MG tablet Take 1 tablet (10 mg total) by mouth at bedtime. 30 tablet 11  . mupirocin ointment (BACTROBAN) 2 % APPLY TO THE AFFECTED AREA TID  3  . MYRBETRIQ 50 MG TB24 tablet Take 50 mg by mouth at bedtime.   0  . nitroGLYCERIN (NITROSTAT) 0.4 MG SL tablet Place 1 tablet (0.4 mg total) under the tongue every 5 (five) minutes as needed for chest pain. 90 tablet 3  . Omega-3 1000 MG CAPS Take 1 capsule (1,000 mg total) by mouth 2 (two) times daily. 60 capsule 11  . potassium chloride (K-DUR) 10 MEQ tablet Take 2 tablets (20 mEq total) by mouth daily. (Patient taking differently: Take 10 mEq by mouth daily. ) 180 tablet 3  . Red Yeast Rice Extract 600 MG CAPS Take 600 mg by mouth daily.    . tadalafil (CIALIS) 5 MG tablet Take 1 tablet (5 mg total) by mouth daily as needed for erectile dysfunction. Do not take nitro within 48 hrs after taking cialis 10 tablet 1  . testosterone cypionate (DEPOTESTOTERONE CYPIONATE) 200 MG/ML injection Inject into the muscle every 21 ( twenty-one) days. 1.62 % injection    . doxycycline (VIBRAMYCIN) 100 MG capsule TAKE 1 CAPSULE BY MOUTH BID FOR 7 DAYS  0  . fluticasone furoate-vilanterol (BREO ELLIPTA) 200-25 MCG/INH AEPB Inhale 1 puff into the lungs daily. (Patient not taking: Reported on 10/09/2018) 1 each 0   No facility-administered medications prior to visit.     Review of Systems  Constitutional: Negative for chills, fever, malaise/fatigue and weight loss.  HENT: Negative for hearing loss, sore throat and tinnitus.   Eyes: Negative for blurred vision and double vision.  Respiratory: Positive for shortness of breath. Negative for cough, hemoptysis, sputum production, wheezing and stridor.   Cardiovascular: Negative for chest pain, palpitations, orthopnea, leg swelling and PND.  Gastrointestinal: Negative for abdominal pain, constipation, diarrhea, heartburn, nausea and vomiting.   Genitourinary: Negative for dysuria, hematuria and urgency.  Musculoskeletal: Negative for joint pain and myalgias.  Skin: Negative for itching and rash.  Neurological: Negative for dizziness, tingling, weakness and headaches.  Endo/Heme/Allergies: Negative for environmental allergies. Does not bruise/bleed easily.  Psychiatric/Behavioral: Negative for depression. The patient is not nervous/anxious and does not have insomnia.   All other systems reviewed and are negative.   Objective:  Physical Exam Vitals signs reviewed.  Constitutional:      General: He is not in acute distress.    Appearance: He is well-developed.  HENT:     Head: Normocephalic and atraumatic.  Eyes:     General: No scleral icterus.    Conjunctiva/sclera: Conjunctivae normal.     Pupils: Pupils are equal, round, and reactive to light.  Neck:     Musculoskeletal: Neck supple.     Vascular: No JVD.     Trachea: No tracheal deviation.  Cardiovascular:  Rate and Rhythm: Normal rate and regular rhythm.     Heart sounds: Normal heart sounds. No murmur.  Pulmonary:     Effort: Pulmonary effort is normal. No tachypnea, accessory muscle usage or respiratory distress.     Breath sounds: Normal breath sounds. No stridor. No wheezing, rhonchi or rales.  Abdominal:     General: Bowel sounds are normal. There is no distension.     Palpations: Abdomen is soft.     Tenderness: There is no abdominal tenderness.  Musculoskeletal:        General: Swelling (right >left LE ) present. No tenderness.  Lymphadenopathy:     Cervical: No cervical adenopathy.  Skin:    General: Skin is warm and dry.     Capillary Refill: Capillary refill takes less than 2 seconds.     Findings: No rash.  Neurological:     Mental Status: He is alert and oriented to person, place, and time.  Psychiatric:        Behavior: Behavior normal.     Vitals:   10/09/18 1018  BP: 118/82  Pulse: 65  SpO2: 98%  Weight: 239 lb 3.2 oz (108.5 kg)   Height: 6\' 3"  (1.905 m)   98% on RA BMI Readings from Last 3 Encounters:  10/09/18 29.90 kg/m  08/31/18 28.46 kg/m  08/21/18 30.06 kg/m   Wt Readings from Last 3 Encounters:  10/09/18 239 lb 3.2 oz (108.5 kg)  08/31/18 240 lb (108.9 kg)  08/21/18 240 lb 8.4 oz (109.1 kg)     CBC    Component Value Date/Time   WBC 9.3 08/21/2018 1705   RBC 4.22 08/21/2018 1705   HGB 12.8 (L) 08/21/2018 1705   HCT 40.4 08/21/2018 1705   PLT 274 08/21/2018 1705   MCV 95.7 08/21/2018 1705   MCH 30.3 08/21/2018 1705   MCHC 31.7 08/21/2018 1705   RDW 13.9 08/21/2018 1705   LYMPHSABS 1.7 08/21/2018 1705   MONOABS 0.7 08/21/2018 1705   EOSABS 0.3 08/21/2018 1705   BASOSABS 0.1 08/21/2018 1705    Regional allergy panel: Positive for multiple items to include cat dander, dog, cockroach, multiple trees. IGE 215  Chest Imaging: CT chest 06/27/2018: No significant parenchymal abnormality. The patient's images have been independently reviewed by me.    Pulmonary Functions Testing Results: 04/17/2018: Post bronc dilator response is recorded FVC 4.7 L 98% predicted FEV1 3.68 L 106% predicted Ratio 77 TLC 93% predicted ERV 39% predicted DLCO 51% predicted  FeNO: None   Pathology: None   Echocardiogram: None   Heart Catheterization:  Fick Cardiac Output 6.26 L/min  Fick Cardiac Output Index 2.64 (L/min)/BSA  RA A Wave 12 mmHg  RA V Wave 11 mmHg  RA Mean 8 mmHg  RV Systolic Pressure 29 mmHg  RV Diastolic Pressure 5 mmHg  RV EDP 11 mmHg  PA Systolic Pressure 31 mmHg  PA Diastolic Pressure 17 mmHg  PA Mean 21 mmHg  AO Systolic Pressure 858 mmHg  AO Diastolic Pressure 76 mmHg  AO Mean 850 mmHg  LV Systolic Pressure 277 mmHg  LV Diastolic Pressure 9 mmHg  LV EDP 20 mmHg  AOp Systolic Pressure 412 mmHg  AOp Diastolic Pressure 68 mmHg  AOp Mean Pressure 878 mmHg  LVp Systolic Pressure 676 mmHg  LVp Diastolic Pressure 9 mmHg  LVp EDP Pressure 19 mmHg  QP/QS 1  TPVR Index  7.95 HRUI  TSVR Index 38.98 HRUI  TPVR/TSVR Ratio 0.2   PVR = 2.0 Sherral Hammers  Assessment & Plan:   VTE (venous thromboembolism)  Multiple subsegmental pulmonary emboli without acute cor pulmonale  Mild intermittent extrinsic asthma without complication  Deep vein thrombosis (DVT) of femoral vein of right lower extremity, unspecified chronicity ([place])  Discussion:  This is an 85 year old with chronic dyspnea.  PFTs with a moderately reduced DLCO, normal spirometry, right heart catheterization with elevated mean PA pressures.  On beta-blockade for heart failure.  Otherwise very active and complains with dyspnea with significant exertion.  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  Patient is concerned about over beta-blockade.  I encouraged him to discuss this with his cardiologist.  He does have symptoms and biochemical evidence of atopic asthma to include a significantly positive RAST panel and elevated IgE of 257.  He has new diagnosis of VTE.  On Eliquis.  Status post trauma from a fall at a football game.  We recommend the following: Patient trialed Breo but had no improvement with symptoms. We will give him samples today of Symbicort. During this time he can hold the use of Flovent which she has been on for a good time now. He needs to continue albuterol as needed for shortness of breath and wheezing Continue Flonase and daily Allegra. Continue Eliquis. Follow-up with cardiology as scheduled. At some point if patient still having symptoms that are consistent with ongoing allergy and asthma patient would likely be candidate for Xolair injections.  However would like to see him on a ICS LABA regularly before he make this decision.  Greater than 50% of this patient's 40-minute office visit was spent face-to-face discussing above recommendations treatment plan as well as the potential candidacy for biologic therapy.  Discussed importance of use of daily inhaler regimen as  described above.   Current Outpatient Medications:  .  albuterol (PROVENTIL HFA;VENTOLIN HFA) 108 (90 Base) MCG/ACT inhaler, Inhale 2 puffs into the lungs every 6 (six) hours as needed for wheezing or shortness of breath., Disp: 1 Inhaler, Rfl: 6 .  alfuzosin (UROXATRAL) 10 MG 24 hr tablet, Take 1 tablet (10 mg total) by mouth daily with breakfast. (Patient taking differently: Take 10 mg by mouth at bedtime. ), Disp: 30 tablet, Rfl: 2 .  ALPRAZolam (XANAX) 0.5 MG tablet, Take 0.5 mg by mouth as needed for anxiety (before scans). , Disp: , Rfl:  .  cabergoline (DOSTINEX) 0.5 MG tablet, Take 0.5 mg by mouth every 7 (seven) days. , Disp: , Rfl: 2 .  carvedilol (COREG) 3.125 MG tablet, Take 0.5 tablets (1.5625 mg total) by mouth 2 (two) times daily., Disp: 45 tablet, Rfl: 3 .  Coenzyme Q10 (COQ10) 200 MG CAPS, Take 200 mg by mouth daily. , Disp: , Rfl:  .  ELIQUIS STARTER PACK (ELIQUIS STARTER PACK) 5 MG TABS, Take as directed on package: start with two-5mg  tablets twice daily for 7 days. On day 8, switch to one-5mg  tablet twice daily., Disp: 1 each, Rfl: 0 .  EPIPEN 2-PAK 0.3 MG/0.3ML SOAJ injection, Inject 0.3 mg as directed once as needed (for a severe allergic reaction). , Disp: , Rfl: 0 .  ergocalciferol (VITAMIN D2) 50000 UNITS capsule, Take 50,000 Units by mouth 2 (two) times a week. , Disp: , Rfl:  .  Evolocumab (REPATHA) 140 MG/ML SOSY, Inject into the skin every 14 (fourteen) days., Disp: , Rfl:  .  ezetimibe (ZETIA) 10 MG tablet, Take 1 tablet (10 mg total) by mouth daily., Disp: 30 tablet, Rfl: 3 .  fexofenadine (ALLEGRA ALLERGY) 180  MG tablet, Take 1 tablet (180 mg total) by mouth daily., Disp: , Rfl:  .  finasteride (PROSCAR) 5 MG tablet, Take 2.5 mg by mouth daily. , Disp: , Rfl:  .  FLOVENT HFA 110 MCG/ACT inhaler, , Disp: , Rfl: 4 .  fluticasone (FLONASE) 50 MCG/ACT nasal spray, Place 2 sprays into both nostrils daily., Disp: 16 g, Rfl: 2 .  furosemide (LASIX) 20 MG tablet, Take 1  tablet (20 mg total) by mouth daily., Disp: 90 tablet, Rfl: 3 .  hydrocortisone (CORTEF) 20 MG tablet, Take 20 mg by mouth 2 (two) times daily., Disp: , Rfl:  .  levothyroxine (SYNTHROID, LEVOTHROID) 150 MCG tablet, Take 150 mcg by mouth at bedtime. , Disp: , Rfl:  .  montelukast (SINGULAIR) 10 MG tablet, Take 1 tablet (10 mg total) by mouth at bedtime., Disp: 30 tablet, Rfl: 11 .  mupirocin ointment (BACTROBAN) 2 %, APPLY TO THE AFFECTED AREA TID, Disp: , Rfl: 3 .  MYRBETRIQ 50 MG TB24 tablet, Take 50 mg by mouth at bedtime. , Disp: , Rfl: 0 .  nitroGLYCERIN (NITROSTAT) 0.4 MG SL tablet, Place 1 tablet (0.4 mg total) under the tongue every 5 (five) minutes as needed for chest pain., Disp: 90 tablet, Rfl: 3 .  Omega-3 1000 MG CAPS, Take 1 capsule (1,000 mg total) by mouth 2 (two) times daily., Disp: 60 capsule, Rfl: 11 .  potassium chloride (K-DUR) 10 MEQ tablet, Take 2 tablets (20 mEq total) by mouth daily. (Patient taking differently: Take 10 mEq by mouth daily. ), Disp: 180 tablet, Rfl: 3 .  Red Yeast Rice Extract 600 MG CAPS, Take 600 mg by mouth daily., Disp: , Rfl:  .  tadalafil (CIALIS) 5 MG tablet, Take 1 tablet (5 mg total) by mouth daily as needed for erectile dysfunction. Do not take nitro within 48 hrs after taking cialis, Disp: 10 tablet, Rfl: 1 .  testosterone cypionate (DEPOTESTOTERONE CYPIONATE) 200 MG/ML injection, Inject into the muscle every 21 ( twenty-one) days. 1.62 % injection, Disp: , Rfl:    Garner Nash, DO [facility] 10/09/2018 10:37 AM

## 2018-10-09 NOTE — Patient Instructions (Addendum)
Thank you for visiting Dr. Valeta Harms at [facility]. Today we recommend the following:  Meds ordered this encounter  Medications  . budesonide-formoterol (SYMBICORT) 160-4.5 MCG/ACT inhaler    Sig: Inhale 2 puffs into the lungs 2 (two) times daily.    Dispense:  2 Inhaler    Refill:  0    Order Specific Question:   Lot Number?    Answer:   [id number] [name] [id number]    Order Specific Question:   Expiration Date?    Answer:   10/02/2019    Order Specific Question:   Manufacturer?    Answer:   [employer] [[id number]]    Order Specific Question:   Quantity    Answer:   2   Return in about 6 months (around 04/09/2019).

## 2018-10-30 DIAGNOSIS — E291 Testicular hypofunction: Secondary | ICD-10-CM | POA: Diagnosis not present

## 2018-10-31 DIAGNOSIS — H52203 Unspecified astigmatism, bilateral: Secondary | ICD-10-CM | POA: Diagnosis not present

## 2018-10-31 DIAGNOSIS — H43813 Vitreous degeneration, bilateral: Secondary | ICD-10-CM | POA: Diagnosis not present

## 2018-10-31 DIAGNOSIS — H5340 Unspecified visual field defects: Secondary | ICD-10-CM | POA: Diagnosis not present

## 2018-10-31 DIAGNOSIS — H4720 Unspecified optic atrophy: Secondary | ICD-10-CM | POA: Diagnosis not present

## 2018-11-05 ENCOUNTER — Other Ambulatory Visit (HOSPITAL_COMMUNITY): Payer: Self-pay

## 2018-11-05 DIAGNOSIS — I2510 Atherosclerotic heart disease of native coronary artery without angina pectoris: Secondary | ICD-10-CM

## 2018-11-05 DIAGNOSIS — I428 Other cardiomyopathies: Secondary | ICD-10-CM

## 2018-11-05 MED ORDER — CARVEDILOL 3.125 MG PO TABS: 1.5625 mg | ORAL_TABLET | Freq: Two times a day (BID) | ORAL | 3 refills | Status: DC

## 2018-11-20 ENCOUNTER — Encounter (HOSPITAL_COMMUNITY): Payer: Self-pay | Admitting: Cardiology

## 2018-11-20 ENCOUNTER — Ambulatory Visit (HOSPITAL_COMMUNITY)
Admission: RE | Admit: 2018-11-20 | Discharge: 2018-11-20 | Disposition: A | Discharge status: Home / Self Care | Payer: Medicare Other | Source: Ambulatory Visit | Attending: Cardiology | Admitting: Cardiology

## 2018-11-20 VITALS — BP 118/80 | HR 64 | Wt 246.0 lb

## 2018-11-20 DIAGNOSIS — N183 Chronic kidney disease, stage 3 (moderate): Secondary | ICD-10-CM | POA: Insufficient documentation

## 2018-11-20 DIAGNOSIS — Z955 Presence of coronary angioplasty implant and graft: Secondary | ICD-10-CM | POA: Insufficient documentation

## 2018-11-20 DIAGNOSIS — I5032 Chronic diastolic (congestive) heart failure: Secondary | ICD-10-CM

## 2018-11-20 DIAGNOSIS — E039 Hypothyroidism, unspecified: Secondary | ICD-10-CM | POA: Diagnosis not present

## 2018-11-20 DIAGNOSIS — M791 Myalgia, unspecified site: Secondary | ICD-10-CM | POA: Insufficient documentation

## 2018-11-20 DIAGNOSIS — G4733 Obstructive sleep apnea (adult) (pediatric): Secondary | ICD-10-CM | POA: Insufficient documentation

## 2018-11-20 DIAGNOSIS — R0600 Dyspnea, unspecified: Secondary | ICD-10-CM | POA: Insufficient documentation

## 2018-11-20 DIAGNOSIS — Z7901 Long term (current) use of anticoagulants: Secondary | ICD-10-CM | POA: Insufficient documentation

## 2018-11-20 DIAGNOSIS — G479 Sleep disorder, unspecified: Secondary | ICD-10-CM

## 2018-11-20 DIAGNOSIS — R600 Localized edema: Secondary | ICD-10-CM | POA: Diagnosis not present

## 2018-11-20 DIAGNOSIS — I2510 Atherosclerotic heart disease of native coronary artery without angina pectoris: Secondary | ICD-10-CM

## 2018-11-20 DIAGNOSIS — I252 Old myocardial infarction: Secondary | ICD-10-CM | POA: Insufficient documentation

## 2018-11-20 DIAGNOSIS — E785 Hyperlipidemia, unspecified: Secondary | ICD-10-CM | POA: Diagnosis not present

## 2018-11-20 DIAGNOSIS — R5383 Other fatigue: Secondary | ICD-10-CM | POA: Insufficient documentation

## 2018-11-20 DIAGNOSIS — J45909 Unspecified asthma, uncomplicated: Secondary | ICD-10-CM | POA: Diagnosis not present

## 2018-11-20 DIAGNOSIS — Z79899 Other long term (current) drug therapy: Secondary | ICD-10-CM | POA: Diagnosis not present

## 2018-11-20 DIAGNOSIS — Z7982 Long term (current) use of aspirin: Secondary | ICD-10-CM | POA: Diagnosis not present

## 2018-11-20 DIAGNOSIS — R0602 Shortness of breath: Secondary | ICD-10-CM | POA: Insufficient documentation

## 2018-11-20 LAB — BASIC METABOLIC PANEL
Anion gap: 12 (ref 5–15)
BUN: 26 mg/dL — AB (ref 8–23)
CALCIUM: 9.4 mg/dL (ref 8.9–10.3)
CO2: 21 mmol/L — ABNORMAL LOW (ref 22–32)
Chloride: 107 mmol/L (ref 98–111)
Creatinine, Ser: 1.44 mg/dL — ABNORMAL HIGH (ref 0.61–1.24)
GFR calc Af Amer: 52 mL/min — ABNORMAL LOW (ref >60)
GFR, EST NON AFRICAN AMERICAN: 45 mL/min — AB (ref >60)
GLUCOSE: 98 mg/dL (ref 70–99)
Potassium: 3.7 mmol/L (ref 3.5–5.1)
Sodium: 140 mmol/L (ref 135–145)

## 2018-11-20 LAB — CBC
HCT: 42.2 % (ref 39.0–52.0)
HEMOGLOBIN: 13.7 g/dL (ref 13.0–17.0)
MCH: 30.7 pg (ref 26.0–34.0)
MCHC: 32.5 g/dL (ref 30.0–36.0)
MCV: 94.6 fL (ref 80.0–100.0)
PLATELETS: 230 10*3/uL (ref 150–400)
RBC: 4.46 MIL/uL (ref 4.22–5.81)
RDW: 14.4 % (ref 11.5–15.5)
WBC: 7 10*3/uL (ref 4.0–10.5)
nRBC: 0 % (ref 0.0–0.2)

## 2018-11-20 LAB — LIPID PANEL
CHOL/HDL RATIO: 4.1 ratio
Cholesterol: 173 mg/dL (ref 0–200)
HDL: 42 mg/dL (ref >40)
LDL Cholesterol: 95 mg/dL (ref 0–99)
Triglycerides: 179 mg/dL — ABNORMAL HIGH (ref <150)
VLDL: 36 mg/dL (ref 0–40)

## 2018-11-20 MED ORDER — NEBIVOLOL HCL 2.5 MG PO TABS: 1.2500 mg | ORAL_TABLET | Freq: Every day | ORAL | 6 refills | Status: DC

## 2018-11-20 NOTE — Progress Notes (Signed)
Patient ID: [name], [name], male   DOB: [birth year], 85 y.o.   MRN: [id number]     Advanced Heart Failure Clinic Note   PCP: 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 male  with history of pituitary adenoma s/p resection, nonobstructive CAD, nonischemic cardiomyopathy, and chronic atrial bigeminy presents for cardiology followup.  Patient has a long history of atrial bigeminy.  He is in this today.  He had had a nonischemic cardiomyopathy with EF around 40% but most recent echo showed normal EF. Historically, he has not been able to tolerate statins, and he has been tried on multiple agents. He is now on Repatha.   In 11/15, he developed chest tightness and was admitted to [facility] with NSTEMI.  LHC showed 60-70% proximal LAD stenosis with totally occluded LCx.  This was opened with DES to the proximal LCx.  Echo post-cath showed EF 55-60%.  In 10/16, he developed typical angina.  LHC showed 95% proximal LAD stenosis that was treated with DES.    He can only tolerate Coreg 3.125 mg 1/2 tab twice a day.  He has had difficulty with BP-active meds in the setting of adrenal insufficiency post pituitary surgery.   Admitted 11/2016 for syncope. Thought to be in setting of adrenal insufficiency complicated by viral gastroenteritis.   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 He has also had PND-like symptoms.   Given worsening symptoms, I started him on low dose Lasix 20 mg daily. Echo in 6/19 showed EF 50-55% with inferolateral hypokinesis.  Cardiolite in 6/19 showed EF 47% with primarily fixed inferolateral defect.  I took him for RHC/LHC.  This showed nonobstructive coronary disease and minimally elevated filling pressures.  PFTs in 7/19 were unremarkable except for decreased DLCO.  Switching from Brilinta to Plavix did not help his symptoms.  He  saw pulmonology and an allergist, he was noted to have a number of environmental allergies, and there was concern that asthma was causing some of his symptoms.    In 11/19, he fell at a [facility] game and injured his right leg.  The leg began to swell soon after and he was found to have a DVT and bilateral PEs. He was started on Eliquis.   He returns today for followup of CAD and dyspnea. He is stable generally. He gets short of breath with moderate to severe exertion.  He has been much less active for the last few months.  Per his wife, no longer going to the gym. Feels fatigued.  Weight is stable.  No chest pain.  No orthopnea/PND.   ECG (personally reviewed): NSR, LVH  Labs (5/12): K 4.3, creatinine 1.03 Labs (2/15): K 4, creatinine 1.1, LDL 184 Labs (11/15): K 4.8, creatinine 1.07 Labs (3/16): K 4.4, creatinine 0.9 Labs (4/16): LDL 80, HDL 37 (on Repatha) Labs (5/16): HCT 42.7 Labs (10/16): creatinine 1.05 Labs (11/16): LDL 77, LDL-P 1283 Labs (6/18): LDL 119, HDL 42, K 4.1, creatinine 1.2 Labs (12/18): LDL 70, HDL 45 Labs (6/19): K 4, creatinine 1.23, BNP 128 Labs (8/19): K 4, creatinine 1.5, HDL 41, LDL 75, hgb 14.8 Labs (9/19): K 4.2, creatinine 1.66, hgb 14.5 Labs (12/19): K 4.7, creatinine 1.7, hgb 14.5  PMH: 1. Hyperlipidemia: He has been unable to tolerate statins.  2. Chronic atrial bigeminy: x years.  3. Hypothyroidism: s/p surgery for pituitary adenoma.  4. Pituitary adenoma: s/p surgery in 3/11 for removal.  Now on thyroid replacement and hydrocortisone.  5. Cardiomyopathy: Nonischemic.  LHC (2009) with 50% mid LAD, 60% distal LAD.  Left ventriculogram (2009) with EF 40-45%.  Echo (8/13) with EF 35-40%, mild LVH.  Echo (11/14) with EF 40%, diffuse hypokinesis, normal RV.  He was tried in the past on an ACEI but apparently at that time began to have side effects from his pituitary adenoma that were thought initially to be ACEI-related.   He has been off ACEI since that time.  He  has had trouble tolerating more that 1/2 tab of 3.125 mg Coreg once a day. Echo (11/15) with EF 55-60%, aortic sclerosis without significant stenosis.  - Echo (3/18): EF 60-65%, no significant valvular abnormalities.  - Echo (6/19): EF 50-55%, mild LV dilation, inferolateral hypokinesis.  6. CAD: 2009 LHC with 50% mid, 60% distal LAD. NSTEMI 11/15 with 60-70% pLAD, totally occluded LCx with DES to proximal LCx.  EF 55-60% by LV-gram.  Angina with LHC 10/16 showing 95% pLAD, 40% mLAD, 40% dLAD, 70% AM2 => treated with DES to pLAD.  - Cardiolite (6/19): EF 47%, primarily fixed inferolateral defect.  - RHC/LHC (6/19): mean RA 8, PA 31/17, mean PCWP 20, CI 2.64. 40% pLAD, patent proximal LAD stent, 60-70% distal LAD, patent LCx stent.  7. Mild cognitive impairment.  8. Carotid stenosis: Carotid dopplers (1/16) with 40-59% RICA stenosis.  - Carotid dopplers (1/17): Mild plaque only.  9. PFTs (7/19): unremarkable except for decreased DLCO.  10. CT chest (7/19): No significant pulmonary disease.  11. Asthma 12. DVT/PE: 11/19, occurred after trauma to right lower leg.  13. CKD: Stage 3.  14. OSA: Sleep study in 12/19 was suggestive of severe OSA, but he did not sleep long enough for it to be diagnostic.   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   FH: Brother with CAD.  Review of systems complete and found to be negative unless listed in HPI.    Current Outpatient Medications  Medication Sig Dispense Refill  . albuterol (PROVENTIL HFA;VENTOLIN HFA) 108 (90 Base) MCG/ACT inhaler Inhale 2 puffs into the lungs every 6 (six) hours as needed for wheezing or shortness of breath. 1 Inhaler 6  . alfuzosin (UROXATRAL) 10 MG 24 hr tablet Take 1 tablet (10 mg total) by mouth daily with breakfast. (Patient taking differently: Take 10 mg by mouth at bedtime. ) 30 tablet 2  . ALPRAZolam (XANAX) 0.5 MG tablet Take 0.5 mg by mouth as needed for anxiety (before scans).     .  budesonide-formoterol (SYMBICORT) 160-4.5 MCG/ACT inhaler Inhale 2 puffs into the lungs 2 (two) times daily. 2 Inhaler 0  . Coenzyme Q10 (COQ10) 200 MG CAPS Take 200 mg by mouth daily.     Marland Kitchen ELIQUIS STARTER PACK (ELIQUIS STARTER PACK) 5 MG TABS Take as directed on package: start with two-5mg  tablets twice daily for 7 days. On day 8, switch to one-5mg  tablet twice daily. 1 each 0  . EPIPEN 2-PAK 0.3 MG/0.3ML SOAJ injection Inject 0.3 mg as directed once as needed (for a severe allergic reaction).   0  . ergocalciferol (VITAMIN D2) 50000 UNITS capsule Take 50,000 Units by mouth 2 (two) times a week.     . Evolocumab (REPATHA) 140 MG/ML SOSY Inject into the skin every 14 (fourteen) days.    Marland Kitchen ezetimibe (ZETIA) 10 MG tablet Take 1 tablet (10 mg  total) by mouth daily. 30 tablet 3  . fexofenadine (ALLEGRA ALLERGY) 180 MG tablet Take 1 tablet (180 mg total) by mouth daily.    . finasteride (PROSCAR) 5 MG tablet Take 2.5 mg by mouth daily.     Marland Kitchen FLOVENT HFA 110 MCG/ACT inhaler   4  . fluticasone (FLONASE) 50 MCG/ACT nasal spray Place 2 sprays into both nostrils daily. 16 g 2  . furosemide (LASIX) 20 MG tablet Take 1 tablet (20 mg total) by mouth daily. 90 tablet 3  . hydrocortisone (CORTEF) 20 MG tablet Take 20 mg by mouth 2 (two) times daily.    Marland Kitchen levothyroxine (SYNTHROID, LEVOTHROID) 150 MCG tablet Take 150 mcg by mouth at bedtime.     . montelukast (SINGULAIR) 10 MG tablet Take 1 tablet (10 mg total) by mouth at bedtime. 30 tablet 11  . MYRBETRIQ 50 MG TB24 tablet Take 50 mg by mouth at bedtime.   0  . nitroGLYCERIN (NITROSTAT) 0.4 MG SL tablet Place 1 tablet (0.4 mg total) under the tongue every 5 (five) minutes as needed for chest pain. 90 tablet 3  . Omega-3 1000 MG CAPS Take 1 capsule (1,000 mg total) by mouth 2 (two) times daily. 60 capsule 11  . potassium chloride (K-DUR) 10 MEQ tablet Take 2 tablets (20 mEq total) by mouth daily. 180 tablet 3  . Red Yeast Rice Extract 600 MG CAPS Take 600 mg by  mouth daily.    . tadalafil (CIALIS) 5 MG tablet Take 1 tablet (5 mg total) by mouth daily as needed for erectile dysfunction. Do not take nitro within 48 hrs after taking cialis 10 tablet 1  . testosterone cypionate (DEPOTESTOTERONE CYPIONATE) 200 MG/ML injection Inject into the muscle every 21 ( twenty-one) days. 1.62 % injection    . nebivolol (BYSTOLIC) 2.5 MG tablet Take 0.5 tablets (1.25 mg total) by mouth daily. 15 tablet 6   No current facility-administered medications for this encounter.     BP 118/80   Pulse 64   Wt 111.6 kg (246 lb)   SpO2 96%   BMI 30.75 kg/m   General: NAD Neck: No JVD, no thyromegaly or thyroid nodule.  Lungs: Clear to auscultation bilaterally with normal respiratory effort. CV: Nondisplaced PMI.  Heart regular S1/S2, no S3/S4, no murmur.  1+ edema to knees bilaterally.  No carotid bruit.  Normal pedal pulses.  Abdomen: Soft, nontender, no hepatosplenomegaly, no distention.  Skin: Intact without lesions or rashes.  Neurologic: Alert and oriented x 3.  Psych: Normal affect. Extremities: No clubbing or cyanosis.  HEENT: Normal.   Assessment/Plan: 1. Cardiomyopathy: EF 40% with diffuse hypokinesis on 11/14 echo, but most recent echo showed EF up to 60-65% (3/18).  Echo in 6/19 with EF 50-55%, mildly dilated LV.  [place] in 6/19 was relatively well-compensated with mildly elevated filling pressures.  Volume status looks ok on exam today.  - He wonder if Coreg could be causing dyspnea.  I will stop this and start him on nebivolol 1.25 mg daily.   - Continue Lasix 20 mg daily. BMET today.    - Wear graded compression stockings for peripheral edema.  2. CAD: s/p NSTEMI with DES to LCx then angina with DES to pLAD.  He has not tolerated multiple statins but is now on Repatha.  LHC was done in 6/19 due to equivocal Cardiolite and ongoing dyspnea, LHC showed no significantly obstructive disease.  - He is now off ASA and Plavix with use of Eliquis. 6 months  after his  DVT, I will decrease his Eliquis to 2.5 mg bid for long-term use post VTE and restart ASA 81 daily.   - Continue Repatha.  3. Hyperlipidemia: He has not tolerated multiple statins. Has previously failed Crestor 5 mg two times/week due to myalgias.   - Continue Repatha, check lipids today.  4. Carotid bruit: 10/2016 dopplers with minimal stenosis. Unchanged from previous.  5. OSA: Severe by sleep study in 12/19 but did not sleep long enough to be diagnostic. I suspect OSA is responsible from some of his fatigue.  - I will try to arrange for a home sleep study, he is willing to do it now.  6. PE/DVT: Post-traumatic DVT in 11/19.  He will need 6 months of Eliquis 5 mg bid, then I will decrease it to 2.5 mg bid for long-term use.   7. Deconditioning: I suspect that some of his fatigue is due to deconditioning.  He has been very inactive recently. I asked him to start going to the gym 3-4 times/week.  8. CKD: Stage 3. Check BMET today.   Followup in 3 months.   [name] 11/20/2018

## 2018-11-20 NOTE — Patient Instructions (Addendum)
STOP Carvedilol (Coreg)  START Bystolic 1.25mg  (1/2 tab) daily  You have been ordered for  Sleep study, they will contact you to schedule your appointment.  You have been given a script for compression stockings  Labs today We will only contact you if something comes back abnormal or we need to make some changes. Otherwise no news is good news!  Your physician recommends that you schedule a follow-up appointment in: 3 months with Dr Aundra Dubin

## 2018-11-24 ENCOUNTER — Ambulatory Visit (INDEPENDENT_AMBULATORY_CARE_PROVIDER_SITE_OTHER): Payer: Medicare Other | Admitting: Neurology

## 2018-11-24 ENCOUNTER — Encounter: Payer: Self-pay | Admitting: Neurology

## 2018-11-24 ENCOUNTER — Other Ambulatory Visit: Payer: Self-pay | Admitting: Neurology

## 2018-11-24 VITALS — BP 102/72 | HR 88 | Ht 75.0 in | Wt 246.0 lb

## 2018-11-24 DIAGNOSIS — I208 Other forms of angina pectoris: Secondary | ICD-10-CM | POA: Diagnosis not present

## 2018-11-24 DIAGNOSIS — I2089 Other forms of angina pectoris: Secondary | ICD-10-CM

## 2018-11-24 DIAGNOSIS — D352 Benign neoplasm of pituitary gland: Secondary | ICD-10-CM

## 2018-11-24 DIAGNOSIS — G3184 Mild cognitive impairment, so stated: Secondary | ICD-10-CM

## 2018-11-24 DIAGNOSIS — I453 Trifascicular block: Secondary | ICD-10-CM | POA: Diagnosis not present

## 2018-11-24 DIAGNOSIS — G4719 Other hypersomnia: Secondary | ICD-10-CM

## 2018-11-24 DIAGNOSIS — R0682 Tachypnea, not elsewhere classified: Secondary | ICD-10-CM | POA: Diagnosis not present

## 2018-11-24 NOTE — Progress Notes (Signed)
Provider:  Larey Seat, MD   Referring Provider: [name], MD Primary Care Physician:  [name], MD  Chief Complaint  Patient presents with  . Follow-up    pt alone, rm 11. pt states that everything is the same.     [name]. Joylene Olsen  HPI:  [name], [name] is a 85 y.o. male patient and part time practicing dentist  , seen here as a revisit   for follow up on subjective memory loss/ MCI.  I have the pleasure of seeing [name] today again he is apparently without any traces of depression, he is not excessively fatigued at 24 out of 63 points on the fatigue severity score and he is not excessively daytime sleepy endorsing only 5 out of 24 possible points on the sleepiness score. His wife endorses a much higher epworth score, he sleeps a lot more and often long ( 10-11 AM) . Bedtime is midnight , he often wakes up for 1 hour and goes back to sleep after that.  OSA ?- Nocturia is fragmenting his sleep.  [name] , MD suggested a HST.  He already ordered a PSG, returning with only 12 minutes of sleep. He is a little agitated and against repeating a study.       01/13/14 Last visit with [name]. Brett Olsen: [name] [name], a partially retired [name], is presenting here in the presence of his wife. [name]. Yetta Olsen wife reported that her husbands personality  has changed, and she feels it is not longer all related to his severe hearing loss. He reports that he has noticed a slowing of his cognitive function rather than loss of memory for several years.  One of his concerns he is delayed recall of names, which sometimes causes him some embarrassment in social situations. 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  He also states that the use at times compulsively searching for these names and there may come back to his memory with delay.  He also a likes to walk or do  some physical activity while thinking about these names.  He had no trouble getting lost while driving and he reports no difficulties with abstraction, attention, or mathematics at basic checkbook balancing etc. In his Montral cognitive assessment test here today he scored 25/30 points. 26/30 is considered the normal range. Based on this test he would be diagnosed as a mild cognitive impairment and not with dementia.  His wife stated he had fallen 4 times and she added some important surgical information, [name] [name] has a history of pituitary Macroadenoma, "the size of a golfball", with previously tunnel vision. [name] [name] treated him.   07-27-15 ,Since last visit, [name] has suffered a heart attack and is now in cardiac rehab, followed by [name]. Loralie Olsen. feels as if there has been no change in his health or memory.  His wife agrees, but is not personally present. His  2015 MRI follow up study showed multiple foci of T2 hyperintensity are present in the subcortical and deep cerebral white matter, nonspecific but compatible with mild chronic small vessel ischemic disease and mild generalized cerebral atrophy.  Unchanged nodular tissue in the anterior sella and extending into the sphenoid sinus, compatible with residual pituitary adenoma, no change from imaging from [facility]. [name] performed today with [place] in a Mini-Mental  Status Examination it revealed 29 out of 30 points and his only missed take was that he couldn't name the exact date which is 07-27-15, This test was followed by a Montral cognitive assessment test and he scored here 25 out of 30 points. The most difficult part of the test are usually clock drawing, the Trail Making Test the serial sevens and the word recall. The patient lost 3 of 5 words and word recall and did repeat one sentence incompletely. This may have been due to his hearing deficits also he is wearing a hearing aid. Visual spatial testing naming and calculation attention and  abstraction as well as orientation were fully intact. He generated 4 F- words.   07/25/2016,  CD I had the pleasure of seeing [name] today. A Montral cognitive assessment was performed and he scored 23 out of 30 points. It was date and month but gave him trouble he had very good visuospatial capacities and the ability to substrate and to spell.He noted delayed recall of names.  I have pleasure of seeing [name] today, 01-30-17, who still practices as a [name] one day a week.  The patient follows [place] for a mild cognitive impairment on a yearly interval. In 2016 he scored on an Mini-Mental exam 29 out of 30 points and on a Montral operative assessment 25 out of 30 points. Since then she has repeatedly performed Montral cognitive assessments and he has been in the normal range. Today again he scored 26 out of 30 points word fluency was 14, he recalled 5 out of 5 words could name all objects and was able to draw a clock face. He does have good visual spatial understanding, which was certainly necessary for his job. He was evaluated in hospital after a fall- he fell in his house at night face down. EMS was called after he reportedly couldn't feel the legs- couldn't get up. Endocrinologist diagnosed pituitary adrenal failure- treated with hydrocortisone, now on 20 and 10 mg. [name] will follow.     Montreal Cognitive Assessment  08/08/2017 01/30/2017 07/25/2016 07/27/2015 07/28/2014  Visuospatial/ Executive (0/5) 4 4 5 5 4   Naming (0/3) 3 3 3 3 3   Attention: Read list of digits (0/2) 2 2 2 2 2   Attention: Read list of letters (0/1) 1 1 1 1 1   Attention: Serial 7 subtraction starting at 100 (0/3) 3 3 2 3 3   Language: Repeat phrase (0/2) 2 1 2 1 2   Language : Fluency (0/1) 1 1 1 1 1   Abstraction (0/2) 2 2 2 2 2   Delayed Recall (0/5) 4 5 1 2 2   Orientation (0/6) 6 4 4 5 6   Total 28 26 23 25 26   Adjusted Score (based on education) - 26 23 25 26      Review of Systems:  Out of a complete 14 system  review, the patient complains of only the following symptoms, and all other reviewed systems are negative.  Hoarse voice, intact delayed recall, trouble with trail making. right knee replacement.  TBI in a skiing accident in 2005. Hearing loss, hunting and hobby farming. Nocturia 2-3 times, OCD- compulsions are progressing according to his wife.  He does not want to treat this condition.   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 . Drives.  Married with adult 2 children, both professionals. Non smoker, rare ETOH, some caffeine.     Social History   Socioeconomic History  .  Marital status: Married    Spouse name: [name]  . Number of children: 2  . Years of education: College  . Highest education level: Not on file  Social Needs  . Financial resource strain: Not on file  . Food insecurity - worry: Not on file  . Food insecurity - inability: Not on file  . Transportation needs - medical: Not on file  . Transportation needs - non-medical: Not on file  Occupational History  . Occupation: retired Programmer, multimedia: RETIRED  Tobacco Use  . Smoking status: Never Smoker  . Smokeless tobacco: Never Used  Substance and Sexual Activity  . Alcohol use: Yes    Comment: occas.  . Drug use: No  . Sexual activity: Not on file  Other Topics Concern  . Not on file  Social History Narrative   Patient is married [name]).   Patient has two children.   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   Patient is right-handed.   Patient does not drink any caffeine.    Family History  Problem Relation Age of Onset  . Congestive Heart Failure Father 77  . Other Mother 74  . Coronary artery disease Brother   . Other Sister 20       polio  . Heart attack Brother        MI/ASCAD 2006  . COPD Sister 54    Past Medical History:  Diagnosis Date  . Atrial premature beats   . BPH (benign prostatic hyperplasia)   . BPH  (benign prostatic hypertrophy)   . Cataract, right eye   . Chronic low back pain   . Colon polyp   . Coronary artery disease   . DDD (degenerative disc disease), lumbar   . Dermatitis 11/16  . Hyperlipidemia   . Insomnia   . Kidney cysts   . Memory loss 2015  . Non-ischemic cardiomyopathy ([place])   . NSTEMI (non-ST elevated myocardial infarction) ([place]) 08/25/2014  . Osteoarthritis of right knee   . Pituitary tumor 2011   macroadenoma w/ VF compromise-then gamma knife a few years leater for some recurrence  . Status post insertion of drug-eluting stent into left anterior descending (LAD) artery 08/28/15   + nuc study.   . Urinary retention   . Vitamin D deficiency 01/2008    Past Surgical History:  Procedure Laterality Date  . ARTHROSCOPIC SURGERY    . BACK SURGERY    . CARDIAC CATHETERIZATION  07/27/2015   [name] Aundra Dubin; oLAD 95%, mLAD 50%, CFX stent OK, OM1 60%, OM2 99%, pPLOM 50%, RCA irregular, AM2 70%, EF 55-60%, no regional wall motion abnormalities.     Marland Kitchen CATARACT EXTRACTION    . CORONARY ANGIOPLASTY WITH STENT PLACEMENT  07/27/2015   XIENCE ALPINE RX 3.0X33 DES to the LAD, 95%>>0  . HAMMER  BUNION TOE SURGERY    . HAND SURGERY Left   . PITUITARY SURGERY     12/01/09 [name]. Wilburn Cornelia and [name]. Ellene Olsen.    Current Outpatient Medications  Medication Sig Dispense Refill  . alfuzosin (UROXATRAL) 10 MG 24 hr tablet Take 1 tablet (10 mg total) by mouth daily with breakfast. 30 tablet 2  . ALPRAZolam (XANAX) 0.5 MG tablet Take 0.5 mg by mouth as needed for anxiety.     Marland Kitchen aspirin 81 MG tablet Take 81 mg by mouth daily.      . carvedilol (COREG) 3.125 MG tablet Take 0.5 tablets (1.5625 mg total) by mouth 2 (  two) times daily. 15 tablet 6  . Coenzyme Q10 (COQ10) 200 MG CAPS Take 1 capsule by mouth daily.      Marland Kitchen EPIPEN 2-PAK 0.3 MG/0.3ML SOAJ injection Inject 1 Dose as directed as needed.  0  . ergocalciferol (VITAMIN D2) 50000 UNITS capsule Take 50,000 Units by mouth 2 (two) times a week.      . Evolocumab (REPATHA) 140 MG/ML SOSY Inject into the skin every 14 (fourteen) days.    Marland Kitchen ezetimibe (ZETIA) 10 MG tablet Take 1 tablet (10 mg total) by mouth daily. 30 tablet 3  . finasteride (PROSCAR) 5 MG tablet Take 5 mg by mouth daily.      . hydrocortisone (CORTEF) 10 MG tablet Take 20 mg by mouth 2 (two) times daily.    Marland Kitchen levothyroxine (SYNTHROID, LEVOTHROID) 150 MCG tablet Take 150 mcg by mouth daily before breakfast.     . MYRBETRIQ 50 MG TB24 tablet Take 50 mg by mouth daily.  0  . nitroGLYCERIN (NITROSTAT) 0.4 MG SL tablet Place 1 tablet (0.4 mg total) under the tongue every 5 (five) minutes as needed for chest pain. 90 tablet 3  . Omega-3 1000 MG CAPS Take 1 capsule (1,000 mg total) by mouth 2 (two) times daily. 60 capsule 11  . omeprazole (PRILOSEC) 40 MG capsule Take 40 mg by mouth daily.  1  . Propylene Glycol (SYSTANE BALANCE OP) Apply 2 drops to eye daily as needed (dry eye).    . Red Yeast Rice Extract 600 MG CAPS Take 600 mg by mouth daily.    . tadalafil (CIALIS) 5 MG tablet Take 1 tablet (5 mg total) by mouth daily as needed for erectile dysfunction. Do not take nitro within 48 hrs after taking cialis 10 tablet 1  . testosterone cypionate (DEPOTESTOTERONE CYPIONATE) 200 MG/ML injection Inject into the muscle every 21 ( twenty-one) days. 1.62 % injection    . ticagrelor (BRILINTA) 60 MG TABS tablet Take 1 tablet (60 mg total) by mouth 2 (two) times daily. Needs an office visit 180 tablet 3   No current facility-administered medications for this visit.     Allergies as of 08/08/2017 - Review Complete 08/08/2017  Allergen Reaction Noted  . Sulfamethoxazole Anaphylaxis and Swelling 09/19/2015  . Bee venom Rash 09/19/2015  . Sulfa antibiotics Nausea And Vomiting 07/15/2014  . Sulfonamide derivatives Nausea And Vomiting 02/10/2009  . Tamsulosin  02/10/2009    Vitals: BP (!) 160/94   Pulse (!) 59   Ht 6' 3"  (1.905 m)   Wt 251 lb (113.9 kg)   BMI 31.37 kg/m    Tachypnoea - 18/min. at rest- now using inhalers.  Last Weight:  Wt Readings from Last 1 Encounters:  08/08/17 251 lb (113.9 kg)   KPT:WSFK mass index is 31.37 kg/m.     Last Height:   Ht Readings from Last 1 Encounters:  08/08/17 6' 3"  (1.905 m)    Physical exam:  General: The patient is awake, alert and appears not in acute distress. The patient is well groomed. Head: Normocephalic, atraumatic. Cardiovascular:  Regular rate and rhythm , without  murmurs or carotid bruit, and without distended neck veins. Respiratory: Lungs are clear to auscultation. Skin:  Without evidence of edema, or rash Trunk: The patient's posture is erect, no scoliosis.   Neurologic exam : The patient is awake and alert, oriented to place and time.   Memory subjective  described as intact.  Memory testing revealed 23 out of 30 on MOCA ( unchanged  since 2018) at best a MCI. Not a dementia.  OCD tendencies confirmed by the patient himself.   Montreal Cognitive Assessment  11/24/2018 08/08/2017 01/30/2017 07/25/2016 07/27/2015  Visuospatial/ Executive (0/5) 5 4 4 5 5   Naming (0/3) 3 3 3 3 3   Attention: Read list of digits (0/2) 1 2 2 2 2   Attention: Read list of letters (0/1) 1 1 1 1 1   Attention: Serial 7 subtraction starting at 100 (0/3) 3 3 3 2 3   Language: Repeat phrase (0/2) 2 2 1 2 1   Language : Fluency (0/1) 1 1 1 1 1   Abstraction (0/2) 1 2 2 2 2   Delayed Recall (0/5) 1 4 5 1 2   Orientation (0/6) 5 6 4 4 5   Total 23 28 26 23 25   Adjusted Score (based on education) - - 26 23 25     Orientation to Place 5  Registration 3  Attention/ Calculation 5  Recall 3  Language- name 2 objects 2  Language- repeat 1  Language- follow 3 step command 3  Language- read & follow direction 1  Write a sentence 1  Copy design 1  Total score 29   Neck 16.5 inches. Mallampati 2.   Nasal septal deviation- nasal congestion.  Attention span & concentration ability appears normal.  Speech is fluent, without  dysarthria, but with strong dysphonia - not aphasia.  He is very hard of hearing.  Mood and affect are appropriate.  Cranial nerves: Pupils are equal in size reactive to light. Extraocular movements  in vertical and horizontal planes intact and without nystagmus. Visual fields by finger perimetry are intact. Hearing; hearing aids in place. Facial sensation intact to fine touch. Facial motor strength is symmetric and tongue and uvula move midline. Shoulder shrug was symmetrical.   Gait and station: Patient walks without assistive device and is able unassisted to climb up to the exam table. Strength within normal limits. The patient rises again from a seated position without bracing himself.  He stands erect. Toe and heel stand were tested and are normal.  Tandem gait is unfragmented.  Turns with 4 Steps. Romberg testing is negative.  Deep tendon reflexes: in the upper and lower extremities are symmetric and intact. Babinski maneuver response is  downgoing.  The patient was advised of the nature of the diagnosed sleep disorder , the treatment options and risks for general a health and wellness arising from not treating the condition.   I spent more than 25 minutes of face to face time with the patient. Greater than 50% of time was spent in counseling and coordination of care. We have discussed the diagnosis and differential and I answered the patient's questions.    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 He walks fast paced every day. He should continue.    Assessment:    After physical and neurologic examination, review of laboratory studies,Results of polysomnography/ neurophysiology testing and pre-existing records as far as provided in visit., my assessment is   1) MCI, by repeated  MOCA . Still Continues on Detrol due to nocturia, has not found a non anti-cholergic medication / option for him. 2) excessive daytime  sleepiness- confirms Shortness of breath/ CHF- and suspected to have central sleep apnea.  3) OCD- he sees no need to treat.  4) hearing loss .  He declines to undergo in lab testing- I will try to convince him to do  a watch pat device.    Plan:  Treatment plan and additional workup :  If we can get a HST approved, will follow up within 60-90 days.   Follow yearly for [place] and MMSE.   Detrol may be d/c if not needed for urgency.    Asencion Partridge Makai Agostinelli MD  08/08/2017   CC: [name], [place], [place] [address]  [place]; [name] Champagne, MD

## 2018-11-26 ENCOUNTER — Telehealth (HOSPITAL_COMMUNITY): Payer: Self-pay

## 2018-11-26 NOTE — Telephone Encounter (Signed)
Prior authorization through [facility] [place] was initiated for bystolic 2.5 medication and sent via [place] on 11/26/2018.

## 2018-11-27 ENCOUNTER — Telehealth (HOSPITAL_COMMUNITY): Payer: Self-pay

## 2018-11-27 DIAGNOSIS — I255 Ischemic cardiomyopathy: Secondary | ICD-10-CM | POA: Diagnosis not present

## 2018-11-27 DIAGNOSIS — E291 Testicular hypofunction: Secondary | ICD-10-CM | POA: Diagnosis not present

## 2018-11-27 DIAGNOSIS — J45998 Other asthma: Secondary | ICD-10-CM | POA: Diagnosis not present

## 2018-11-27 DIAGNOSIS — I2699 Other pulmonary embolism without acute cor pulmonale: Secondary | ICD-10-CM | POA: Diagnosis not present

## 2018-11-27 DIAGNOSIS — Z6831 Body mass index (BMI) 31.0-31.9, adult: Secondary | ICD-10-CM | POA: Diagnosis not present

## 2018-11-27 DIAGNOSIS — I82401 Acute embolism and thrombosis of unspecified deep veins of right lower extremity: Secondary | ICD-10-CM | POA: Diagnosis not present

## 2018-11-27 DIAGNOSIS — E2740 Unspecified adrenocortical insufficiency: Secondary | ICD-10-CM | POA: Diagnosis not present

## 2018-11-27 DIAGNOSIS — R0609 Other forms of dyspnea: Secondary | ICD-10-CM | POA: Diagnosis not present

## 2018-11-27 DIAGNOSIS — E038 Other specified hypothyroidism: Secondary | ICD-10-CM | POA: Diagnosis not present

## 2018-11-27 DIAGNOSIS — E7849 Other hyperlipidemia: Secondary | ICD-10-CM | POA: Diagnosis not present

## 2018-11-27 DIAGNOSIS — I2510 Atherosclerotic heart disease of native coronary artery without angina pectoris: Secondary | ICD-10-CM | POA: Diagnosis not present

## 2018-11-27 DIAGNOSIS — L089 Local infection of the skin and subcutaneous tissue, unspecified: Secondary | ICD-10-CM | POA: Diagnosis not present

## 2018-11-28 ENCOUNTER — Telehealth (HOSPITAL_COMMUNITY): Payer: Self-pay

## 2018-11-28 NOTE — Telephone Encounter (Signed)
Prior authorization through [place] was APPROVED for Bystolic 2.5mg   and will expire on 11/29/2019.

## 2018-12-02 ENCOUNTER — Other Ambulatory Visit (HOSPITAL_COMMUNITY): Payer: Self-pay | Admitting: Cardiology

## 2018-12-04 DIAGNOSIS — E291 Testicular hypofunction: Secondary | ICD-10-CM | POA: Diagnosis not present

## 2018-12-05 NOTE — Telephone Encounter (Signed)
error 

## 2018-12-12 IMAGING — DX DG CHEST 2V
2 series · 3 of 3 positions shown · non-contrast
Comparison: Radiographs July 10, 2016.

CLINICAL DATA: Syncope.

EXAM:
CHEST  2 VIEW

[Series 2: chest lat · 0.14mm/px · 2 of 2 slices shown]
[im 1/2]
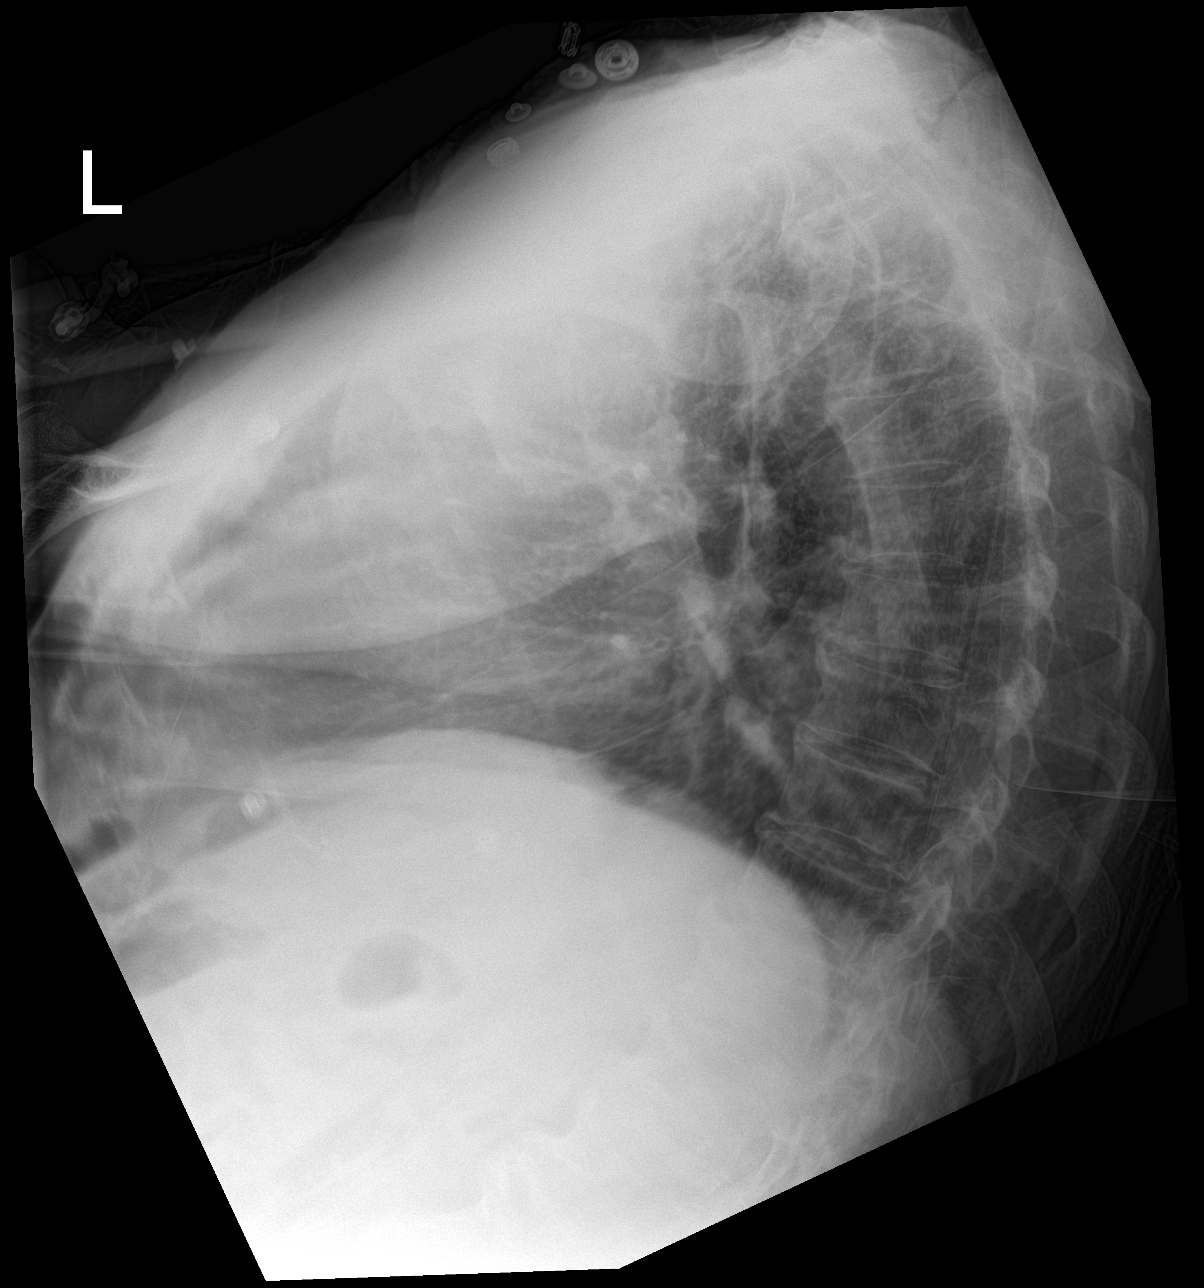
[im 2/2]
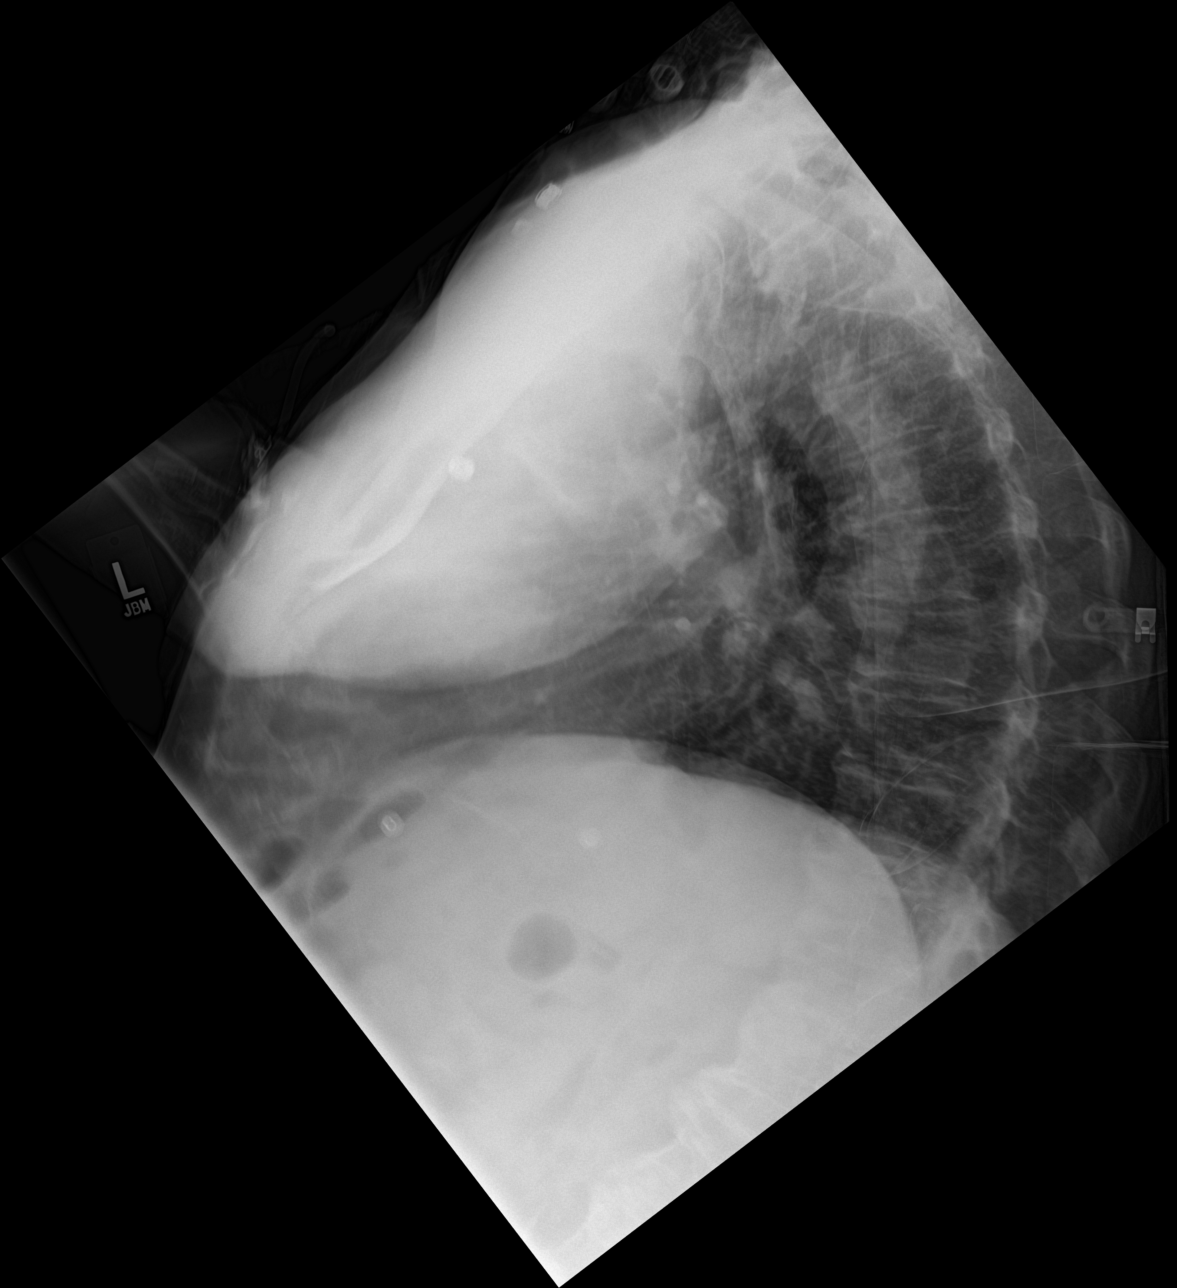

[chest ap]
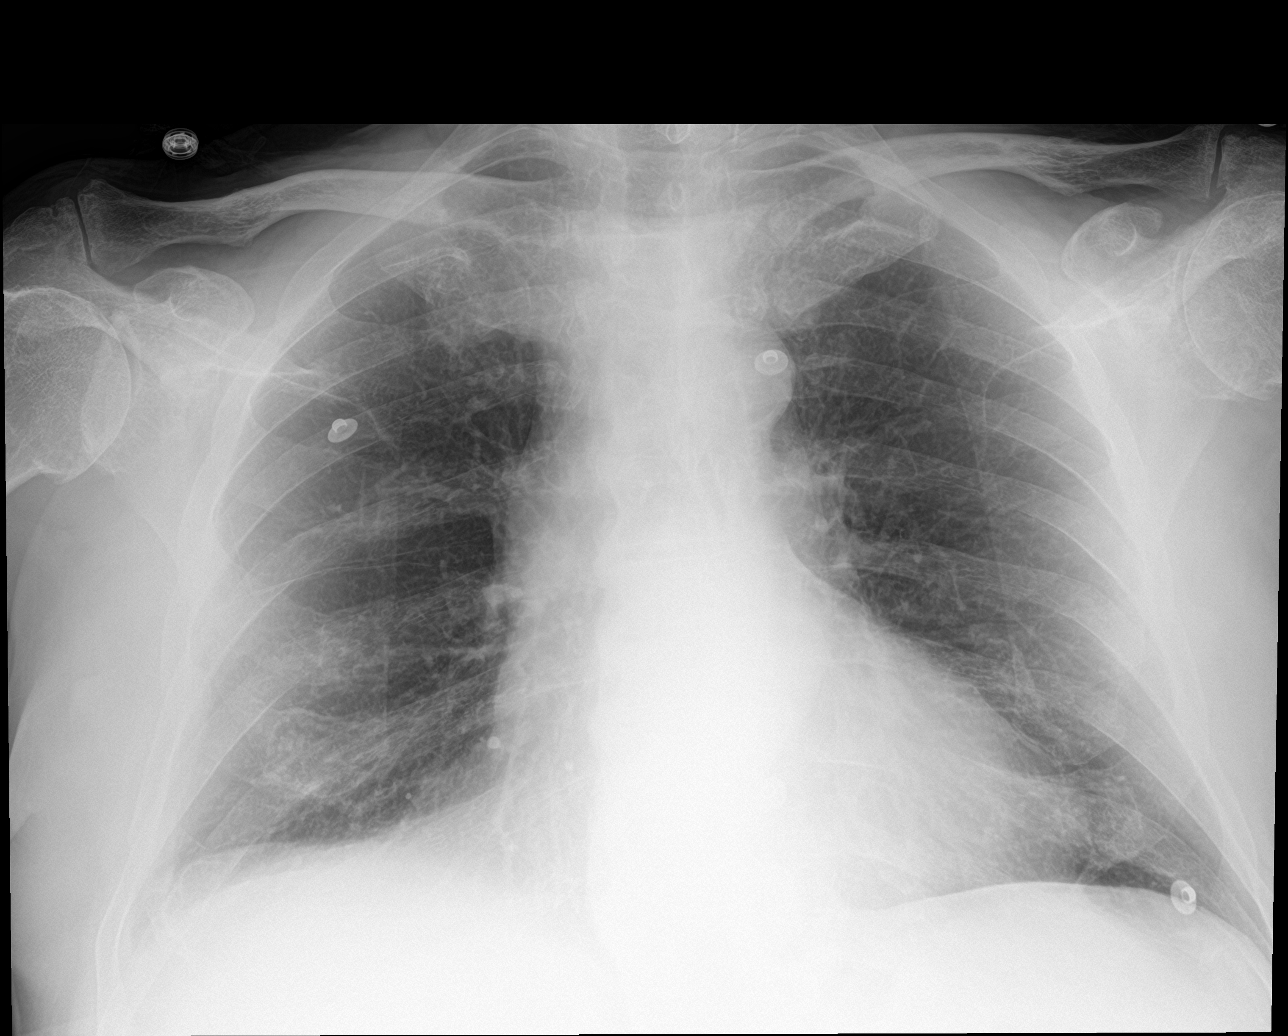

[3 of 3 positions shown; findings below may reference images not displayed]

FINDINGS: The heart size and mediastinal contours are within normal limits.
Both lungs are clear. No pneumothorax or pleural effusion is noted.
The visualized skeletal structures are unremarkable.
IMPRESSION: No active cardiopulmonary disease.

## 2018-12-12 IMAGING — CT CT ABD-PELV W/O CM
2 of 7 series · 10 of 46 positions shown, 11 images · non-contrast
Comparison: None.

CLINICAL DATA: Fall, incontinence.

EXAM:
CT ABDOMEN AND PELVIS WITHOUT CONTRAST
TECHNIQUE: Multidetector CT imaging of the abdomen and pelvis was performed
following the standard protocol without IV contrast.

[Series 201: routine, idose (2) · axial · 0.78mm/px · z∈[-524,-104]mm · 7 of 100 slices shown, 8 images]
[im 8/100  soft-tissue]
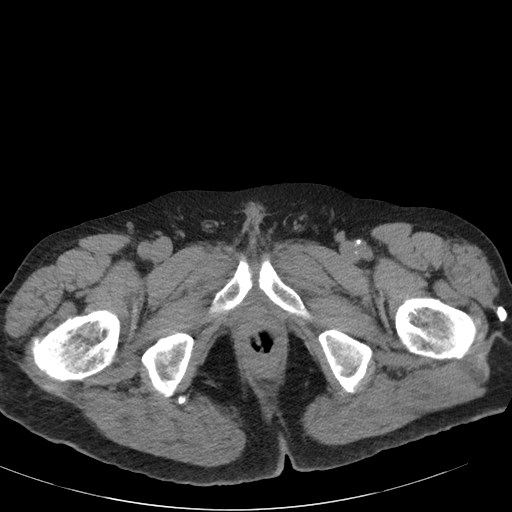
[im 8/100  bone]
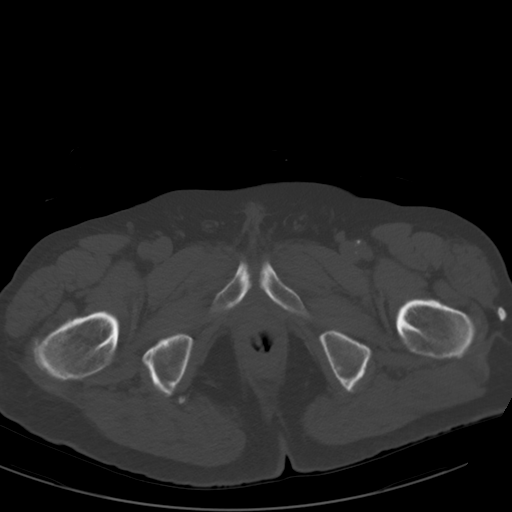
[im 23/100  soft-tissue]
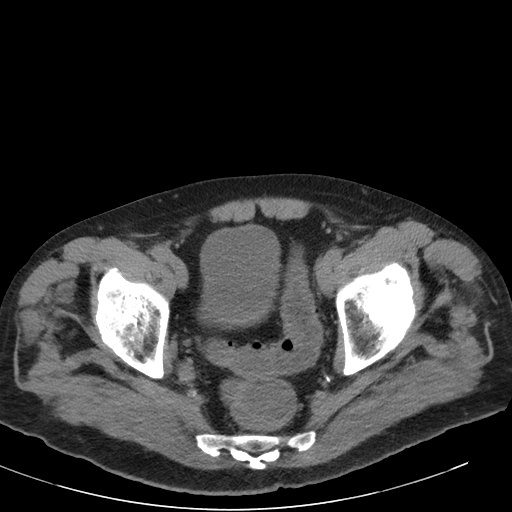
[im 35/100  soft-tissue]
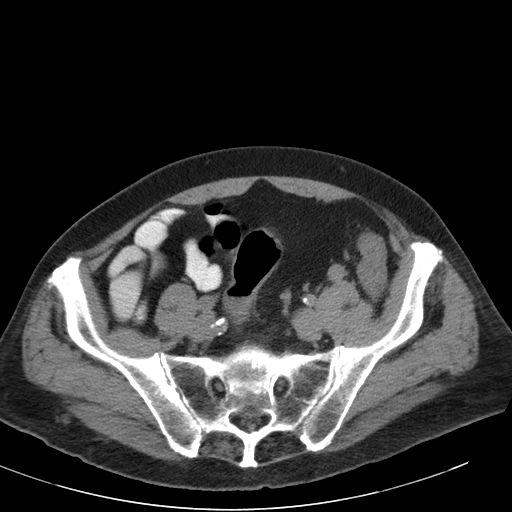
[im 50/100  soft-tissue]
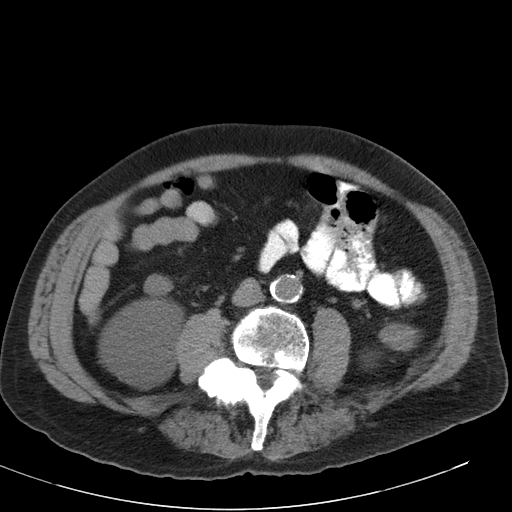
[im 65/100  soft-tissue]
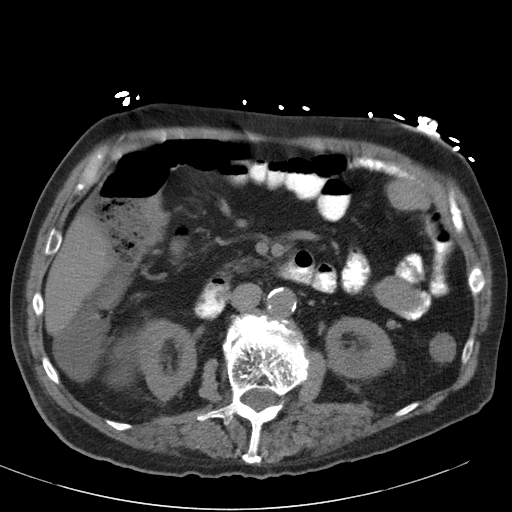
[im 77/100  soft-tissue]
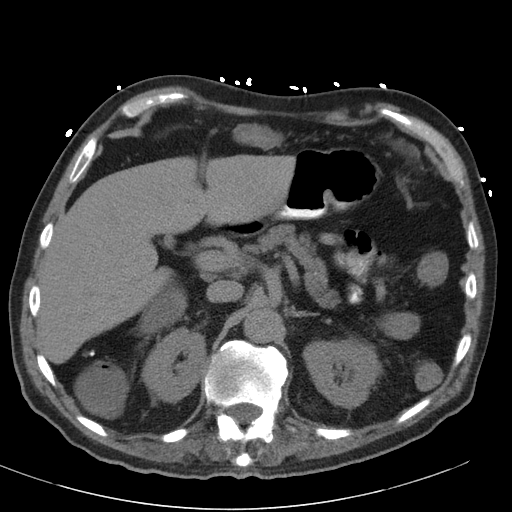
[im 92/100  soft-tissue]
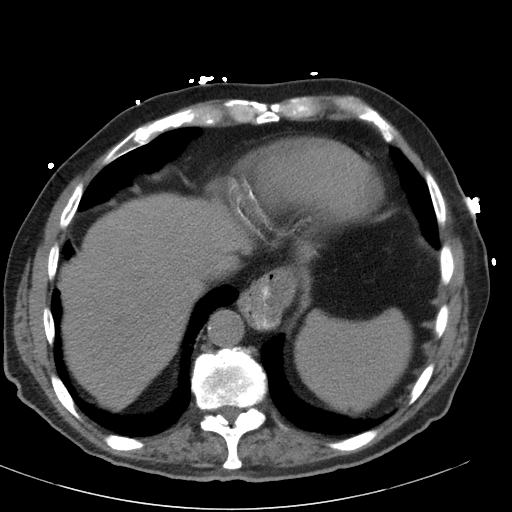

[Series 203: coronals, idose (2) · coronal · 0.45mm/px · 3 of 133 slices shown]
[im 34/133  soft-tissue]
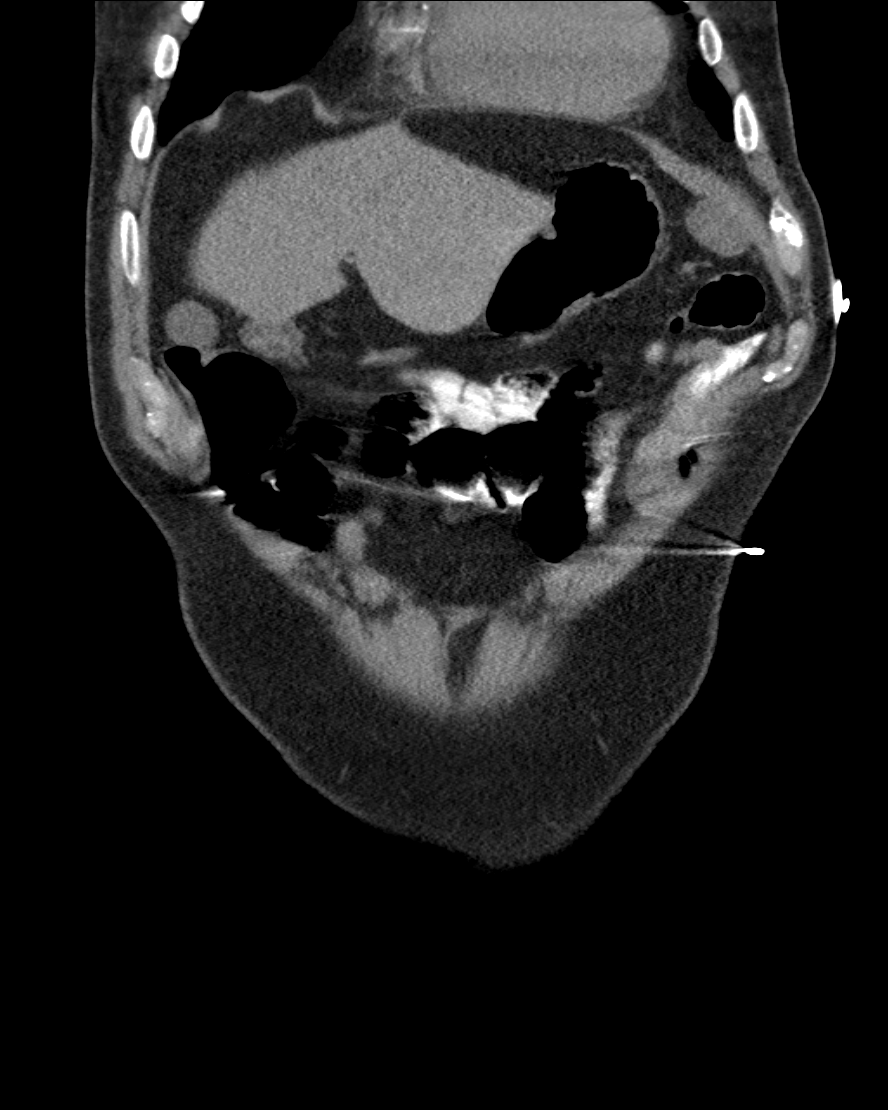
[im 67/133  soft-tissue]
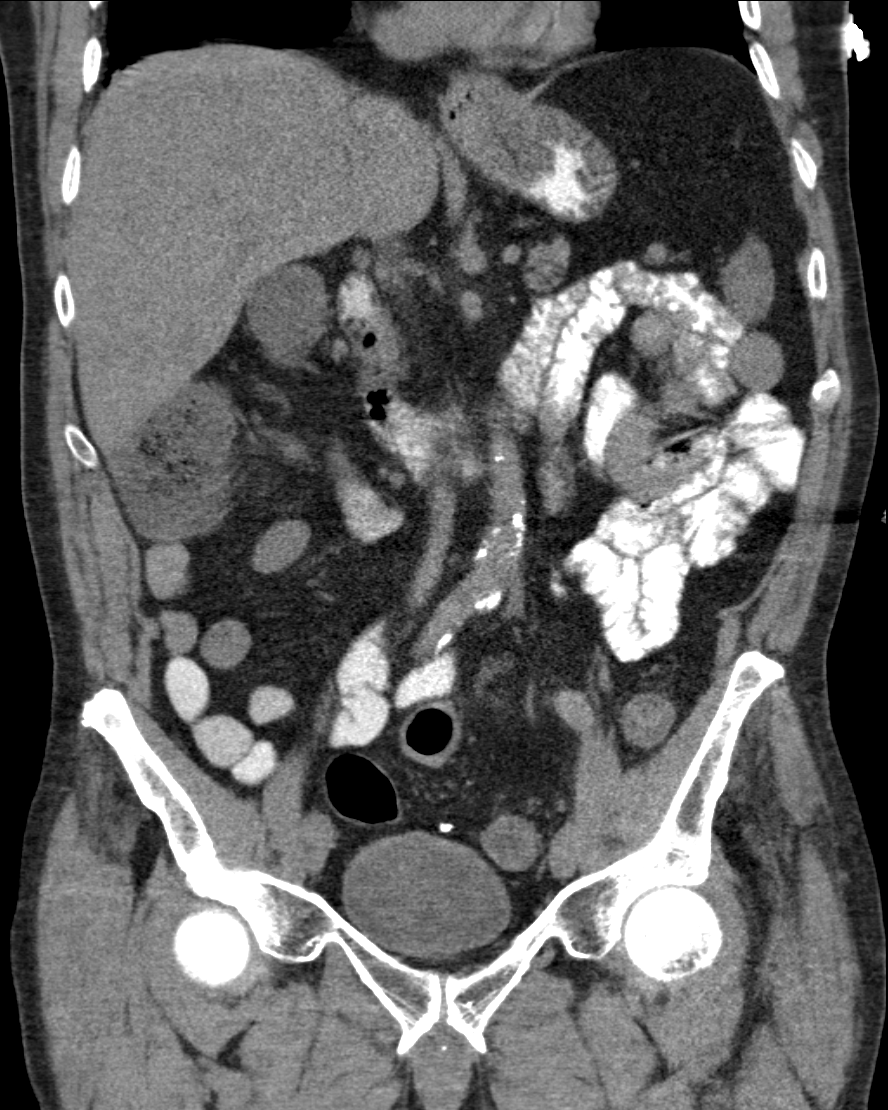
[im 100/133  soft-tissue]
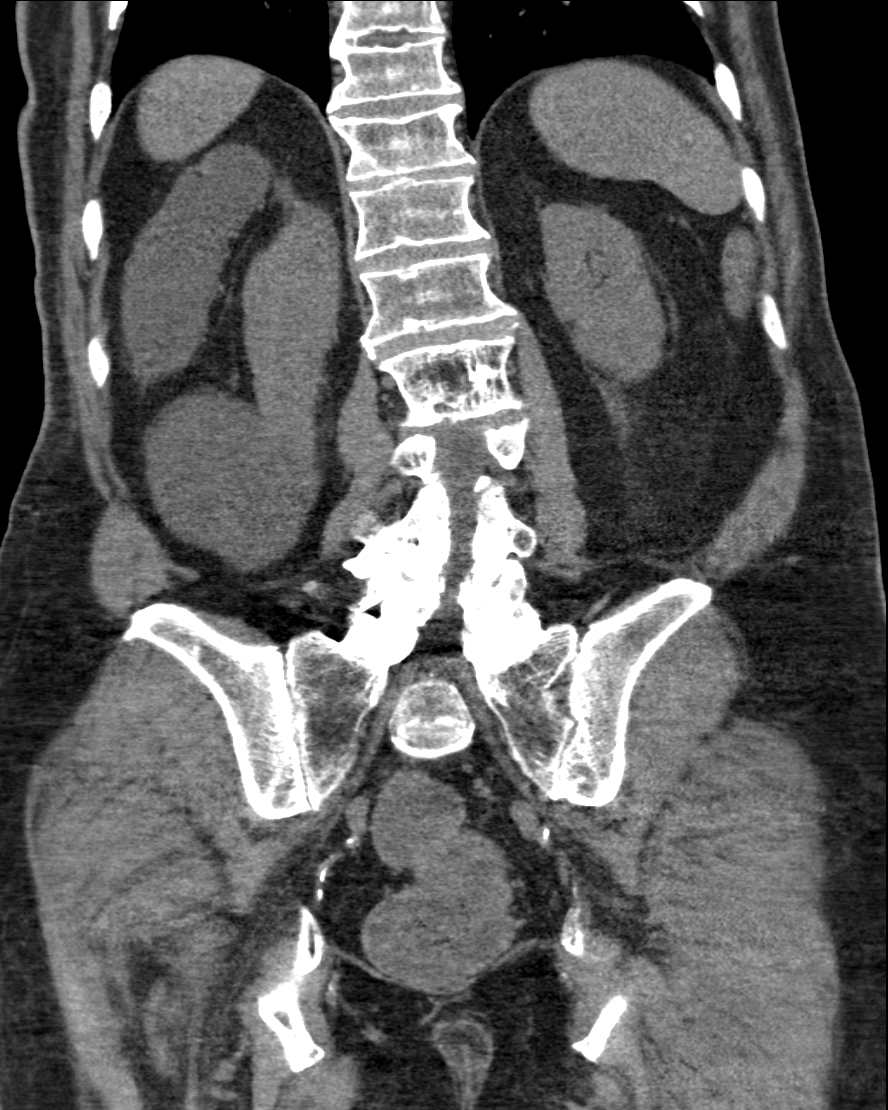

[10 of 46 positions shown; findings below may reference images not displayed]

FINDINGS: Lower chest: No acute abnormality.

Hepatobiliary: Layering stones within the otherwise normal-appearing
gallbladder. No focal abnormality identified within the liver. No
bile duct dilatation.

Pancreas: Unremarkable. No pancreatic ductal dilatation or
surrounding inflammatory changes.

Spleen: Normal in size without focal abnormality.

Adrenals/Urinary Tract: Adrenal glands appear normal. Large right
renal cyst. Small left renal cyst. No renal stone or hydronephrosis
bilaterally. No ureteral or bladder calculi identified.

Stomach/Bowel: Bowel is normal in caliber. No bowel wall thickening
or evidence of bowel wall inflammation seen. Fluid is present within
the large and small bowel compatible with history of diarrhea.

Vascular/Lymphatic: Aortic atherosclerosis. No enlarged lymph nodes
seen.

Reproductive: Unremarkable.

Other: No free fluid or abscess collection. No free intraperitoneal
air.

Musculoskeletal: Degenerative changes throughout the thoracolumbar
spine, at least moderate in degree. No acute or suspicious osseous
finding.
IMPRESSION: 1. Fluid throughout the majority of the nondistended large bowel,
compatible with history of diarrhea. No evidence of focal bowel wall
thickening or inflammation. I cannot exclude a mild diffuse colitis.
2. Cholelithiasis without evidence of acute cholecystitis.
3. Aortic atherosclerosis.
4. Additional chronic/incidental findings detailed above.

## 2018-12-30 DIAGNOSIS — E291 Testicular hypofunction: Secondary | ICD-10-CM | POA: Diagnosis not present

## 2019-01-14 DIAGNOSIS — I2699 Other pulmonary embolism without acute cor pulmonale: Secondary | ICD-10-CM | POA: Diagnosis not present

## 2019-01-14 DIAGNOSIS — I2510 Atherosclerotic heart disease of native coronary artery without angina pectoris: Secondary | ICD-10-CM | POA: Diagnosis not present

## 2019-01-14 DIAGNOSIS — N183 Chronic kidney disease, stage 3 (moderate): Secondary | ICD-10-CM | POA: Diagnosis not present

## 2019-01-14 DIAGNOSIS — D751 Secondary polycythemia: Secondary | ICD-10-CM | POA: Diagnosis not present

## 2019-01-14 DIAGNOSIS — G3184 Mild cognitive impairment, so stated: Secondary | ICD-10-CM | POA: Diagnosis not present

## 2019-01-14 DIAGNOSIS — D352 Benign neoplasm of pituitary gland: Secondary | ICD-10-CM | POA: Diagnosis not present

## 2019-01-14 DIAGNOSIS — G4700 Insomnia, unspecified: Secondary | ICD-10-CM | POA: Diagnosis not present

## 2019-01-14 DIAGNOSIS — E2740 Unspecified adrenocortical insufficiency: Secondary | ICD-10-CM | POA: Diagnosis not present

## 2019-01-14 DIAGNOSIS — R7301 Impaired fasting glucose: Secondary | ICD-10-CM | POA: Diagnosis not present

## 2019-01-14 DIAGNOSIS — E785 Hyperlipidemia, unspecified: Secondary | ICD-10-CM | POA: Diagnosis not present

## 2019-01-14 DIAGNOSIS — I255 Ischemic cardiomyopathy: Secondary | ICD-10-CM | POA: Diagnosis not present

## 2019-01-14 DIAGNOSIS — I779 Disorder of arteries and arterioles, unspecified: Secondary | ICD-10-CM | POA: Diagnosis not present

## 2019-01-15 DIAGNOSIS — E2740 Unspecified adrenocortical insufficiency: Secondary | ICD-10-CM | POA: Diagnosis not present

## 2019-02-12 ENCOUNTER — Other Ambulatory Visit: Payer: Self-pay

## 2019-02-12 MED ORDER — ALBUTEROL SULFATE HFA 108 (90 BASE) MCG/ACT IN AERS: 2.0000 | INHALATION_SPRAY | Freq: Four times a day (QID) | RESPIRATORY_TRACT | 1 refills | Status: DC | PRN

## 2019-02-19 ENCOUNTER — Encounter (HOSPITAL_COMMUNITY): Payer: Medicare Other | Admitting: Cardiology

## 2019-02-19 ENCOUNTER — Encounter (HOSPITAL_COMMUNITY): Payer: Self-pay

## 2019-02-19 ENCOUNTER — Ambulatory Visit (HOSPITAL_COMMUNITY)
Admission: RE | Admit: 2019-02-19 | Discharge: 2019-02-19 | Disposition: A | Discharge status: Home / Self Care | Payer: Medicare Other | Source: Ambulatory Visit | Attending: Cardiology | Admitting: Cardiology

## 2019-02-19 ENCOUNTER — Other Ambulatory Visit: Payer: Self-pay

## 2019-02-19 DIAGNOSIS — I5032 Chronic diastolic (congestive) heart failure: Secondary | ICD-10-CM

## 2019-02-19 DIAGNOSIS — R0600 Dyspnea, unspecified: Secondary | ICD-10-CM

## 2019-02-19 DIAGNOSIS — I429 Cardiomyopathy, unspecified: Secondary | ICD-10-CM

## 2019-02-19 DIAGNOSIS — G479 Sleep disorder, unspecified: Secondary | ICD-10-CM

## 2019-02-19 MED ORDER — ASPIRIN EC 81 MG PO TBEC: 81.0000 mg | DELAYED_RELEASE_TABLET | Freq: Every day | ORAL | 3 refills | Status: DC

## 2019-02-19 MED ORDER — APIXABAN 2.5 MG PO TABS: 2.5000 mg | ORAL_TABLET | Freq: Two times a day (BID) | ORAL | 3 refills | Status: DC

## 2019-02-19 NOTE — H&P (View-Only) (Signed)
Heart Failure TeleHealth Note  Due to national recommendations of social distancing due to [place] 19, Audio/video telehealth visit is felt to be most appropriate for this patient at this time.  See MyChart message from today for patient consent regarding telehealth for [name]  Date:  02/19/2019   ID:  [name], [name], DOB 1933/10/31, MRN [id number]  Location: Home  Provider location: [facility] Type of Visit: Established patient   PCP:  [name], MD  Cardiologist: Dr. Aundra Dubin  Chief Complaint: Shortness of breath.   History of Present Illness: [name], [name] is a 85 y.o. male who presents via audio/video conferencing for a telehealth visit today.     he denies symptoms worrisome for COVID 19.    Patient has a history of pituitary adenoma s/p resection, nonobstructive CAD, nonischemic cardiomyopathy, and chronic atrial bigeminy. Nonischemic cardiomyopathy with EF 40% was diagnosed by echo in 2014. Historically, he has not been able to tolerate statins, and he has been tried on multiple agents. He is now on Repatha.   In 11/15, he developed chest tightness and was admitted to [facility] with NSTEMI.  LHC showed 60-70% proximal LAD stenosis with totally occluded LCx.  This was opened with DES to the proximal LCx.  Echo post-cath showed EF 55-60%.  In 10/16, he developed typical angina. LHC showed 95% proximal LAD stenosis that was treated with DES.    He has had difficulty with BP-active meds in the setting of adrenal insufficiency post pituitary surgery.   Admitted 11/2016 for syncope. Thought to be in setting of adrenal insufficiency complicated by viral gastroenteritis.   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 He has also had PND-like symptoms.   Given worsening symptoms, I started him on low dose  Lasix 20 mg daily. Echo in 6/19 showed EF 50-55% with inferolateral hypokinesis.  Cardiolite in 6/19 showed EF 47% with primarily fixed inferolateral defect.  I took him for RHC/LHC.  This showed nonobstructive coronary disease and minimally elevated filling pressures.  PFTs in 7/19 were unremarkable except for decreased DLCO.  Switching from Brilinta to Plavix did not help his symptoms.  He saw pulmonology and an allergist, he was noted to have a number of environmental allergies, and there was concern that asthma was causing some of his symptoms.    In 11/19, he fell at a [employer] game and injured his right leg.  The leg began to swell soon after and he was found to have a DVT and bilateral PEs. He was started on Eliquis.   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  This is frustrating to him, but has been long-standing now and has not been progressive.  No chest pain.  Inhalers have not helped him.  No lightheadedness, no falls, no palpitations.    Labs (5/12): K 4.3, creatinine 1.03 Labs (2/15): K 4, creatinine 1.1, LDL 184 Labs (11/15): K 4.8, creatinine 1.07 Labs (3/16): K 4.4, creatinine 0.9 Labs (4/16): LDL 80, HDL 37 (on Repatha) Labs (5/16): HCT 42.7 Labs (10/16): creatinine 1.05 Labs (11/16): LDL 77, LDL-P 1283 Labs (6/18): LDL 119, HDL 42, K 4.1, creatinine 1.2 Labs (12/18): LDL 70, HDL 45 Labs (6/19): K 4, creatinine 1.23, BNP 128 Labs (8/19): K 4, creatinine 1.5, HDL 41, LDL 75, hgb  14.8 Labs (9/19): K 4.2, creatinine 1.66, hgb 14.5 Labs (12/19): K 4.7, creatinine 1.7, hgb 14.5 Labs (2/20): K 3.7, creatinine 1.44, hgb 13.7, LDL 95  PMH: 1. Hyperlipidemia: He has been unable to tolerate statins.  2. Chronic atrial bigeminy: x years.  3. Hypothyroidism: s/p surgery for pituitary adenoma.  4. Pituitary adenoma: s/p surgery in 3/11 for removal.  Now on thyroid replacement and hydrocortisone.  5. Cardiomyopathy: LHC (2009) with 50% mid LAD,  60% distal LAD.  Left ventriculogram (2009) with EF 40-45%.  Echo (8/13) with EF 35-40%, mild LVH.  Echo (11/14) with EF 40%, diffuse hypokinesis, normal RV.  He was tried in the past on an ACEI but apparently at that time began to have side effects from his pituitary adenoma that were thought initially to be ACEI-related.   He has been off ACEI since that time.  He has had trouble tolerating more that 1/2 tab of 3.125 mg Coreg once a day. Echo (11/15) with EF 55-60%, aortic sclerosis without significant stenosis.  - Echo (3/18): EF 60-65%, no significant valvular abnormalities.  - Echo (6/19): EF 50-55%, mild LV dilation, inferolateral hypokinesis.  6. CAD: 2009 LHC with 50% mid, 60% distal LAD. NSTEMI 11/15 with 60-70% pLAD, totally occluded LCx with DES to proximal LCx.  EF 55-60% by LV-gram.  Angina with LHC 10/16 showing 95% pLAD, 40% mLAD, 40% dLAD, 70% AM2 => treated with DES to pLAD.  - Cardiolite (6/19): EF 47%, primarily fixed inferolateral defect.  - RHC/LHC (6/19): mean RA 8, PA 31/17, mean PCWP 20, CI 2.64. 40% pLAD, patent proximal LAD stent, 60-70% distal LAD, patent LCx stent.  7. Mild cognitive impairment.  8. Carotid stenosis: Carotid dopplers (1/16) with 40-59% RICA stenosis.  - Carotid dopplers (1/17): Mild plaque only.  9. PFTs (7/19): unremarkable except for decreased DLCO.  10. CT chest (7/19): No significant pulmonary disease.  11. Asthma 12. DVT/PE: 11/19, occurred after trauma to right lower leg.  13. CKD: Stage 3.  14. OSA: Sleep study in 12/19 was suggestive of severe OSA, but he did not sleep long enough for it to be diagnostic.   Current Outpatient Medications  Medication Sig Dispense Refill  . albuterol (VENTOLIN HFA) 108 (90 Base) MCG/ACT inhaler Inhale 2 puffs into the lungs every 6 (six) hours as needed for wheezing or shortness of breath. 3 Inhaler 1  . alfuzosin (UROXATRAL) 10 MG 24 hr tablet Take 1 tablet (10 mg total) by mouth daily with breakfast. (Patient  taking differently: Take 10 mg by mouth at bedtime. ) 30 tablet 2  . ALPRAZolam (XANAX) 0.5 MG tablet Take 0.5 mg by mouth as needed for anxiety (before scans).     Marland Kitchen apixaban (ELIQUIS) 2.5 MG TABS tablet Take 1 tablet (2.5 mg total) by mouth 2 (two) times daily. 180 tablet 3  . aspirin EC 81 MG tablet Take 1 tablet (81 mg total) by mouth daily. 90 tablet 3  . budesonide-formoterol (SYMBICORT) 160-4.5 MCG/ACT inhaler Inhale 2 puffs into the lungs 2 (two) times daily. 2 Inhaler 0  . Coenzyme Q10 (COQ10) 200 MG CAPS Take 200 mg by mouth daily.     Marland Kitchen EPIPEN 2-PAK 0.3 MG/0.3ML SOAJ injection Inject 0.3 mg as directed once as needed (for a severe allergic reaction).   0  . ergocalciferol (VITAMIN D2) 50000 UNITS capsule Take 50,000 Units by mouth 2 (two) times a week.     . Evolocumab (REPATHA) 140 MG/ML SOSY Inject into the skin every 14 (  fourteen) days.    Marland Kitchen ezetimibe (ZETIA) 10 MG tablet TAKE 1 TABLET(10 MG) BY MOUTH DAILY 30 tablet 3  . fexofenadine (ALLEGRA ALLERGY) 180 MG tablet Take 1 tablet (180 mg total) by mouth daily.    . finasteride (PROSCAR) 5 MG tablet Take 2.5 mg by mouth daily.     Marland Kitchen FLOVENT HFA 110 MCG/ACT inhaler   4  . fluticasone (FLONASE) 50 MCG/ACT nasal spray Place 2 sprays into both nostrils daily. 16 g 2  . furosemide (LASIX) 20 MG tablet Take 1 tablet (20 mg total) by mouth daily. 90 tablet 3  . hydrocortisone (CORTEF) 20 MG tablet Take 20 mg by mouth 2 (two) times daily.    Marland Kitchen levothyroxine (SYNTHROID, LEVOTHROID) 150 MCG tablet Take 150 mcg by mouth at bedtime.     . montelukast (SINGULAIR) 10 MG tablet Take 1 tablet (10 mg total) by mouth at bedtime. 30 tablet 11  . MYRBETRIQ 50 MG TB24 tablet Take 50 mg by mouth at bedtime.   0  . nebivolol (BYSTOLIC) 2.5 MG tablet Take 0.5 tablets (1.25 mg total) by mouth daily. 15 tablet 6  . nitroGLYCERIN (NITROSTAT) 0.4 MG SL tablet Place 1 tablet (0.4 mg total) under the tongue every 5 (five) minutes as needed for chest pain. 90  tablet 3  . Omega-3 1000 MG CAPS Take 1 capsule (1,000 mg total) by mouth 2 (two) times daily. 60 capsule 11  . potassium chloride (K-DUR) 10 MEQ tablet Take 2 tablets (20 mEq total) by mouth daily. 180 tablet 3  . Red Yeast Rice Extract 600 MG CAPS Take 600 mg by mouth daily.    . tadalafil (CIALIS) 5 MG tablet Take 1 tablet (5 mg total) by mouth daily as needed for erectile dysfunction. Do not take nitro within 48 hrs after taking cialis 10 tablet 1  . testosterone cypionate (DEPOTESTOTERONE CYPIONATE) 200 MG/ML injection Inject into the muscle every 21 ( twenty-one) days. 1.62 % injection     No current facility-administered medications for this encounter.     Allergies:   Sulfa antibiotics; Sulfamethoxazole; Sulfonamide derivatives; Bee venom; Flonase [fluticasone]; and Tamsulosin   Social History:  The patient  reports that he has never smoked. He has never used smokeless tobacco. He reports current alcohol use. He reports that he does not use drugs.   Family History:  The patient's family history includes COPD (age of onset: 32) in his sister; Congestive Heart Failure (age of onset: 95) in his father; Coronary artery disease in his brother; Heart attack in his brother; Other (age of onset: 31) in his sister; Other (age of onset: 75) in his mother.   ROS:  Please see the history of present illness.   All other systems are personally reviewed and negative.   Exam:  (Video/Tele Health Call; Exam is subjective and or/visual.)  General:  Speaks in full sentences. No resp difficulty. Lungs: Normal respiratory effort with conversation.  Abdomen: Non-distended per patient report Extremities: Pt denies edema. Neuro: Alert & oriented x 3.   Recent Labs: 07/17/2018: B Natriuretic Peptide 138.7 11/20/2018: BUN 26; Creatinine, Ser 1.44; Hemoglobin 13.7; Platelets 230; Potassium 3.7; Sodium 140  Personally reviewed   Wt Readings from Last 3 Encounters:  11/24/18 111.6 kg (246 lb)  11/20/18  111.6 kg (246 lb)  10/09/18 108.5 kg (239 lb 3.2 oz)      ASSESSMENT AND PLAN:  1. Cardiomyopathy: EF 40% with diffuse hypokinesis on 11/14 echo, but echo in 3/18 showed EF  up to 60-65%.  Echo in 6/19 with EF 50-55%, mildly dilated LV.  [place] in 6/19 was relatively well-compensated with mildly elevated filling pressures.  He has had chronic exertional dyspnea which I have had trouble explaining.  Increasing Lasix has not helped.  Inhalers have not helped, he has seen pulmonary.  It is possible that it is due to deconditioning.  - Contiue nebivolol 1.25 mg daily.   - Continue Lasix 20 mg daily. I will arrange for BMET.    - I am going to set him up for a CPX to see if this can help explain his chronic dyspnea   2. CAD: s/p NSTEMI with DES to LCx then angina with DES to pLAD.  He has not tolerated multiple statins but is now on Repatha.  LHC was done in 6/19 due to equivocal Cardiolite and ongoing dyspnea, LHC showed no significantly obstructive disease.  - He is now off ASA and Plavix with use of Eliquis. It has been 6 months after his DVT, so I will decrease his Eliquis to 2.5 mg bid for long-term use post VTE and restart ASA 81 daily.   - Continue Repatha.  3. Hyperlipidemia: He has not tolerated multiple statins. Has previously failed Crestor 5 mg two times/week due to myalgias.   - Continue Repatha, and I will arrange repeat lipids (LDL was above goal in 2/20).   4. Carotid bruit: 10/2016 dopplers with minimal stenosis. Unchanged from previous.  5. OSA: Severe by sleep study in 12/19 but did not sleep long enough to be diagnostic. I suspect OSA is responsible from some of his fatigue.  He snores loudly and has daytime sleepiness and fatigue.  - I am going to set him up for a home sleep study.  6. PE/DVT: Post-traumatic DVT in 11/19.  He can decrease Eliquis to 2.5 mg bid for long-term use.  7. Deconditioning: I suspect that some of his fatigue/dyspnea is due to deconditioning.  I encouraged  regular exercise. 8. CKD: Stage 3. Arrange BMET.   COVID screen The patient does not have any symptoms that suggest any further testing/ screening at this time.  Social distancing reinforced today.  Patient Risk: After full review of this patients clinical status, I feel that they are at moderate risk for cardiac decompensation at this time.  Relevant cardiac medications were reviewed at length with the patient today. The patient does not have concerns regarding their medications at this time.   Recommended follow-up:  3 months  Today, I have spent 19 minutes with the patient with telehealth technology discussing the above issues .    Signed, [name], MD  02/19/2019  [place] [address] [facility] [place] [address] [phone] (office) [phone] (fax)

## 2019-02-19 NOTE — Progress Notes (Signed)
Heart Failure TeleHealth Note  Due to national recommendations of social distancing due to [name], Audio/video telehealth visit is felt to be most appropriate for this patient at this time.  See MyChart message from today for patient consent regarding telehealth for [name].  Date:  02/19/2019   ID:  [name], [name], DOB [birth year], MRN [id number]  Location: Home  Provider location: [facility] Type of Visit: Established patient   PCP:  [name] Infante, MD  Cardiologist: Dr. Aundra Olsen  Chief Complaint: Shortness of breath.   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     he denies symptoms worrisome for COVID Olsen.    Patient has a history of pituitary adenoma s/p resection, nonobstructive CAD, nonischemic cardiomyopathy, and chronic atrial bigeminy. Nonischemic cardiomyopathy with EF 40% was diagnosed by echo in 2014. Historically, he has not been able to tolerate statins, and he has been tried on multiple agents. He is now on Repatha.   In 11/15, he developed chest tightness and was admitted to [facility] with NSTEMI.  LHC showed 60-70% proximal LAD stenosis with totally occluded LCx.  This was opened with DES to the proximal LCx.  Echo post-cath showed EF 55-60%.  In 10/16, he developed typical angina. LHC showed 95% proximal LAD stenosis that was treated with DES.    He has had difficulty with BP-active meds in the setting of adrenal insufficiency post pituitary surgery.   Admitted 11/2016 for syncope. Thought to be in setting of adrenal insufficiency complicated by viral gastroenteritis.   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 He has also had PND-like symptoms.   Given worsening symptoms, I started him on low dose  Lasix 20 mg daily. Echo in 6/Olsen showed EF 50-55% with inferolateral hypokinesis.  Cardiolite in 6/Olsen showed EF 47% with primarily fixed inferolateral defect.  I took him for RHC/LHC.  This showed nonobstructive coronary disease and minimally elevated filling pressures.  PFTs in 7/Olsen were unremarkable except for decreased DLCO.  Switching from Brilinta to Plavix did not help his symptoms.  He saw pulmonology and an allergist, he was noted to have a number of environmental allergies, and there was concern that asthma was causing some of his symptoms.    In 11/Olsen, he fell at a [employer] [facility] game and injured his right leg.  The leg began to swell soon after and he was found to have a DVT and bilateral PEs. He was started on Eliquis.   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  This is frustrating to him, but has been long-standing now and has not been progressive.  No chest pain.  Inhalers have not helped him.  No lightheadedness, no falls, no palpitations.    Labs (5/12): K 4.3, creatinine 1.03 Labs (2/15): K 4, creatinine 1.1, LDL 184 Labs (11/15): K 4.8, creatinine 1.07 Labs (3/16): K 4.4, creatinine 0.9 Labs (4/16): LDL 80, HDL 37 (on Repatha) Labs (5/16): HCT 42.7 Labs (10/16): creatinine 1.05 Labs (11/16): LDL 77, LDL-P 1283 Labs (6/18): LDL 119, HDL 42, K 4.1, creatinine 1.2 Labs (12/18): LDL 70, HDL 45 Labs (6/Olsen): K 4, creatinine 1.23, BNP 128 Labs (8/Olsen): K 4, creatinine 1.5, HDL 41, LDL 75, hgb  14.8 Labs (9/Olsen): K 4.2, creatinine 1.66, hgb 14.5 Labs (12/Olsen): K 4.7, creatinine 1.7, hgb 14.5 Labs (2/20): K 3.7, creatinine 1.44, hgb 13.7, LDL 95  PMH: 1. Hyperlipidemia: He has been unable to tolerate statins.  2. Chronic atrial bigeminy: x years.  3. Hypothyroidism: s/p surgery for pituitary adenoma.  4. Pituitary adenoma: s/p surgery in 3/11 for removal.  Now on thyroid replacement and hydrocortisone.  5. Cardiomyopathy: LHC (2009) with 50% mid LAD,  60% distal LAD.  Left ventriculogram (2009) with EF 40-45%.  Echo (8/13) with EF 35-40%, mild LVH.  Echo (11/14) with EF 40%, diffuse hypokinesis, normal RV.  He was tried in the past on an ACEI but apparently at that time began to have side effects from his pituitary adenoma that were thought initially to be ACEI-related.   He has been off ACEI since that time.  He has had trouble tolerating more that 1/2 tab of 3.125 mg Coreg once a day. Echo (11/15) with EF 55-60%, aortic sclerosis without significant stenosis.  - Echo (3/18): EF 60-65%, no significant valvular abnormalities.  - Echo (6/Olsen): EF 50-55%, mild LV dilation, inferolateral hypokinesis.  6. CAD: 2009 LHC with 50% mid, 60% distal LAD. NSTEMI 11/15 with 60-70% pLAD, totally occluded LCx with DES to proximal LCx.  EF 55-60% by LV-gram.  Angina with LHC 10/16 showing 95% pLAD, 40% mLAD, 40% dLAD, 70% AM2 => treated with DES to pLAD.  - Cardiolite (6/Olsen): EF 47%, primarily fixed inferolateral defect.  - RHC/LHC (6/Olsen): mean RA 8, PA 31/17, mean PCWP 20, CI 2.64. 40% pLAD, patent proximal LAD stent, 60-70% distal LAD, patent LCx stent.  7. Mild cognitive impairment.  8. Carotid stenosis: Carotid dopplers (1/16) with 40-59% RICA stenosis.  - Carotid dopplers (1/17): Mild plaque only.  9. PFTs (7/Olsen): unremarkable except for decreased DLCO.  10. CT chest (7/Olsen): No significant pulmonary disease.  11. Asthma 12. DVT/PE: 11/Olsen, occurred after trauma to right lower leg.  13. CKD: Stage 3.  14. OSA: Sleep study in 12/Olsen was suggestive of severe OSA, but he did not sleep long enough for it to be diagnostic.   Current Outpatient Medications  Medication Sig Dispense Refill  . albuterol (VENTOLIN HFA) 108 (90 Base) MCG/ACT inhaler Inhale 2 puffs into the lungs every 6 (six) hours as needed for wheezing or shortness of breath. 3 Inhaler 1  . alfuzosin (UROXATRAL) 10 MG 24 hr tablet Take 1 tablet (10 mg total) by mouth daily with breakfast. (Patient  taking differently: Take 10 mg by mouth at bedtime. ) 30 tablet 2  . ALPRAZolam (XANAX) 0.5 MG tablet Take 0.5 mg by mouth as needed for anxiety (before scans).     Marland Kitchen apixaban (ELIQUIS) 2.5 MG TABS tablet Take 1 tablet (2.5 mg total) by mouth 2 (two) times daily. 180 tablet 3  . aspirin EC 81 MG tablet Take 1 tablet (81 mg total) by mouth daily. 90 tablet 3  . budesonide-formoterol (SYMBICORT) 160-4.5 MCG/ACT inhaler Inhale 2 puffs into the lungs 2 (two) times daily. 2 Inhaler 0  . Coenzyme Q10 (COQ10) 200 MG CAPS Take 200 mg by mouth daily.     Marland Kitchen EPIPEN 2-PAK 0.3 MG/0.3ML SOAJ injection Inject 0.3 mg as directed once as needed (for a severe allergic reaction).   0  . ergocalciferol (VITAMIN D2) 50000 UNITS capsule Take 50,000 Units by mouth 2 (two) times a week.     . Evolocumab (REPATHA) 140 MG/ML SOSY Inject into the skin every 14 (  fourteen) days.    Marland Kitchen ezetimibe (ZETIA) 10 MG tablet TAKE 1 TABLET(10 MG) BY MOUTH DAILY 30 tablet 3  . fexofenadine (ALLEGRA ALLERGY) 180 MG tablet Take 1 tablet (180 mg total) by mouth daily.    . finasteride (PROSCAR) 5 MG tablet Take 2.5 mg by mouth daily.     Marland Kitchen FLOVENT HFA 110 MCG/ACT inhaler   4  . fluticasone (FLONASE) 50 MCG/ACT nasal spray Place 2 sprays into both nostrils daily. 16 g 2  . furosemide (LASIX) 20 MG tablet Take 1 tablet (20 mg total) by mouth daily. 90 tablet 3  . hydrocortisone (CORTEF) 20 MG tablet Take 20 mg by mouth 2 (two) times daily.    Marland Kitchen levothyroxine (SYNTHROID, LEVOTHROID) 150 MCG tablet Take 150 mcg by mouth at bedtime.     . montelukast (SINGULAIR) 10 MG tablet Take 1 tablet (10 mg total) by mouth at bedtime. 30 tablet 11  . MYRBETRIQ 50 MG TB24 tablet Take 50 mg by mouth at bedtime.   0  . nebivolol (BYSTOLIC) 2.5 MG tablet Take 0.5 tablets (1.25 mg total) by mouth daily. 15 tablet 6  . nitroGLYCERIN (NITROSTAT) 0.4 MG SL tablet Place 1 tablet (0.4 mg total) under the tongue every 5 (five) minutes as needed for chest pain. 90  tablet 3  . Omega-3 1000 MG CAPS Take 1 capsule (1,000 mg total) by mouth 2 (two) times daily. 60 capsule 11  . potassium chloride (K-DUR) 10 MEQ tablet Take 2 tablets (20 mEq total) by mouth daily. 180 tablet 3  . Red Yeast Rice Extract 600 MG CAPS Take 600 mg by mouth daily.    . tadalafil (CIALIS) 5 MG tablet Take 1 tablet (5 mg total) by mouth daily as needed for erectile dysfunction. Do not take nitro within 48 hrs after taking cialis 10 tablet 1  . testosterone cypionate (DEPOTESTOTERONE CYPIONATE) 200 MG/ML injection Inject into the muscle every 21 ( twenty-one) days. 1.62 % injection     No current facility-administered medications for this encounter.     Allergies:   Sulfa antibiotics; Sulfamethoxazole; Sulfonamide derivatives; Bee venom; Flonase [fluticasone]; and Tamsulosin   Social History:  The patient  reports that he has never smoked. He has never used smokeless tobacco. He reports current alcohol use. He reports that he does not use drugs.   Family History:  The patient's family history includes COPD (age of onset: 64) in his sister; Congestive Heart Failure (age of onset: 85) in his father; Coronary artery disease in his brother; Heart attack in his brother; Other (age of onset: 61) in his sister; Other (age of onset: 39) in his mother.   ROS:  Please see the history of present illness.   All other systems are personally reviewed and negative.   Exam:  (Video/Tele Health Call; Exam is subjective and or/visual.)  General:  Speaks in full sentences. No resp difficulty. Lungs: Normal respiratory effort with conversation.  Abdomen: Non-distended per patient report Extremities: Pt denies edema. Neuro: Alert & oriented x 3.   Recent Labs: 07/17/2018: B Natriuretic Peptide 138.7 11/20/2018: BUN 26; Creatinine, Ser 1.44; Hemoglobin 13.7; Platelets 230; Potassium 3.7; Sodium 140  Personally reviewed   Wt Readings from Last 3 Encounters:  11/24/18 111.6 kg (246 lb)  11/20/18  111.6 kg (246 lb)  10/09/18 108.5 kg (239 lb 3.2 oz)      ASSESSMENT AND PLAN:  1. Cardiomyopathy: EF 40% with diffuse hypokinesis on 11/14 echo, but echo in 3/18 showed EF  up to 60-65%.  Echo in 6/Olsen with EF 50-55%, mildly dilated LV.  [place] in 6/Olsen was relatively well-compensated with mildly elevated filling pressures.  He has had chronic exertional dyspnea which I have had trouble explaining.  Increasing Lasix has not helped.  Inhalers have not helped, he has seen pulmonary.  It is possible that it is due to deconditioning.  - Contiue nebivolol 1.25 mg daily.   - Continue Lasix 20 mg daily. I will arrange for BMET.    - I am going to set him up for a CPX to see if this can help explain his chronic dyspnea   2. CAD: s/p NSTEMI with DES to LCx then angina with DES to pLAD.  He has not tolerated multiple statins but is now on Repatha.  LHC was done in 6/Olsen due to equivocal Cardiolite and ongoing dyspnea, LHC showed no significantly obstructive disease.  - He is now off ASA and Plavix with use of Eliquis. It has been 6 months after his DVT, so I will decrease his Eliquis to 2.5 mg bid for long-term use post VTE and restart ASA 81 daily.   - Continue Repatha.  3. Hyperlipidemia: He has not tolerated multiple statins. Has previously failed Crestor 5 mg two times/week due to myalgias.   - Continue Repatha, and I will arrange repeat lipids (LDL was above goal in 2/20).   4. Carotid bruit: 10/2016 dopplers with minimal stenosis. Unchanged from previous.  5. OSA: Severe by sleep study in 12/Olsen but did not sleep long enough to be diagnostic. I suspect OSA is responsible from some of his fatigue.  He snores loudly and has daytime sleepiness and fatigue.  - I am going to set him up for a home sleep study.  6. PE/DVT: Post-traumatic DVT in 11/Olsen.  He can decrease Eliquis to 2.5 mg bid for long-term use.  7. Deconditioning: I suspect that some of his fatigue/dyspnea is due to deconditioning.  I encouraged  regular exercise. 8. CKD: Stage 3. Arrange BMET.   COVID screen The patient does not have any symptoms that suggest any further testing/ screening at this time.  Social distancing reinforced today.  Patient Risk: After full review of this patients clinical status, I feel that they are at moderate risk for cardiac decompensation at this time.  Relevant cardiac medications were reviewed at length with the patient today. The patient does not have concerns regarding their medications at this time.   Recommended follow-up:  3 months  Today, I have spent Olsen minutes with the patient with telehealth technology discussing the above issues .    Signed, [name], MD  02/19/2019  [place] [address] [facility] [place] [address] [phone] (office) [phone] (fax)

## 2019-02-19 NOTE — Progress Notes (Signed)
Patient Name:         DOB:       Height:6'3"     OVZCHY:850  Office Name:         Referring Provider: [name], MD  Today's Date: 02/19/19  Date:   STOP BANG RISK ASSESSMENT S (snore) Have you been told that you snore?     NO   T (tired) Are you often tired, fatigued, or sleepy during the day?   YES  O (obstruction) Do you stop breathing, choke, or gasp during sleep? NO   P (pressure) Do you have or are you being treated for high blood pressure? NO   B (BMI) Is your body index greater than 35 kg/m? NO   A (age) Are you 85 years old or older? YES   N (neck) Do you have a neck circumference greater than 16 inches?   YES   G (gender) Are you a male? YES   TOTAL STOP/BANG "YES" ANSWERS 4                                                                       For Office Use Only              Procedure Order Form    YES to 3+ Stop Bang questions OR two clinical symptoms - patient qualifies for WatchPAT (CPT 95800)     Submit: This Form + Patient Face Sheet + Clinical Note via Cloud[employer] or Fax: [phone]         Clinical Notes: Will consult Sleep Specialist and refer for management of therapy due to patient increased risk of Sleep Apnea. Ordering a sleep study due to the following two clinical symptoms: Excessive daytime sleepiness G47.10 / Gastroesophageal reflux K21.9 / Nocturia R35.1 / Morning Headaches G44.221 / Difficulty concentrating R41.840 / Memory problems or poor judgment G31.84 / Personality changes or irritability R45.4 / Loud snoring R06.83 / Depression F32.9 / Unrefreshed by sleep G47.8 / Impotence N52.9 / History of high blood pressure R03.0 / Insomnia G47.00    I understand that I am proceeding with a home sleep apnea test as ordered by my treating physician. I understand that untreated sleep apnea is a serious cardiovascular risk factor and it is my responsibility to perform the test and seek management for sleep apnea. I will be contacted with the results and  be managed for sleep apnea by a local sleep physician. I will be receiving equipment and further instructions from [facility]. I shall promptly ship back the equipment via the included mailing label. I understand my insurance will be billed for the test and as the patient I am responsible for any insurance related out-of-pocket costs incurred. I have been provided with written instructions and can call for additional video or telephonic instruction, with 24-hour availability of qualified personnel to answer any questions: Patient Help Desk [phone].  Patient Signature ______________________________________________________   Date______________________ Patient Telemedicine Verbal Consent

## 2019-02-19 NOTE — Patient Instructions (Addendum)
Lab work needs to be drawn. This is scheduled Tuesday May 26th at 12:45pm.  RESTART Aspirin 81 mg daily  DECREASE Apixaban 2.5mg  (1 tab) two times daily.  Your provider has recommended that you have a home sleep study.  [name] is the company that provides these and will send the equipment right to your home with instructions on how to set it up.  Once you have completed the test you just dispose of the equipment, the information is automatically uploaded to [place].  If your test was positive and you need a home CPAP machine you will be contacted by Dr Theodosia Blender office [facility]) to set this up.   Your physician has recommended that you have a cardiopulmonary stress test (CPX). CPX testing is a non-invasive measurement of heart and lung function. It replaces a traditional treadmill stress test. This type of test provides a tremendous amount of information that relates not only to your present condition but also for future outcomes. This test combines measurements of you ventilation, respiratory gas exchange in the lungs, electrocardiogram (EKG), blood pressure and physical response before, during, and following an exercise protocol.  Please follow up with Dr. Aundra Dubin in 3 months

## 2019-02-24 ENCOUNTER — Other Ambulatory Visit: Payer: Self-pay

## 2019-02-24 ENCOUNTER — Telehealth (HOSPITAL_COMMUNITY): Payer: Self-pay

## 2019-02-24 ENCOUNTER — Ambulatory Visit (HOSPITAL_COMMUNITY)
Admission: RE | Admit: 2019-02-24 | Discharge: 2019-02-24 | Disposition: A | Discharge status: Home / Self Care | Payer: Medicare Other | Source: Ambulatory Visit | Attending: Cardiology | Admitting: Cardiology

## 2019-02-24 DIAGNOSIS — I5032 Chronic diastolic (congestive) heart failure: Secondary | ICD-10-CM

## 2019-02-24 DIAGNOSIS — I429 Cardiomyopathy, unspecified: Secondary | ICD-10-CM | POA: Diagnosis not present

## 2019-02-24 LAB — BASIC METABOLIC PANEL
Anion gap: 14 (ref 5–15)
BUN: 32 mg/dL — ABNORMAL HIGH (ref 8–23)
CO2: 25 mmol/L (ref 22–32)
Calcium: 9.6 mg/dL (ref 8.9–10.3)
Chloride: 102 mmol/L (ref 98–111)
Creatinine, Ser: 1.99 mg/dL — ABNORMAL HIGH (ref 0.61–1.24)
GFR calc Af Amer: 35 mL/min — ABNORMAL LOW (ref >60)
GFR calc non Af Amer: 30 mL/min — ABNORMAL LOW (ref >60)
Glucose, Bld: 101 mg/dL — ABNORMAL HIGH (ref 70–99)
Potassium: 4 mmol/L (ref 3.5–5.1)
Sodium: 141 mmol/L (ref 135–145)

## 2019-02-24 LAB — LIPID PANEL
Cholesterol: 198 mg/dL (ref 0–200)
HDL: 43 mg/dL (ref >40)
LDL Cholesterol: 106 mg/dL — ABNORMAL HIGH (ref 0–99)
Total CHOL/HDL Ratio: 4.6 RATIO
Triglycerides: 243 mg/dL — ABNORMAL HIGH (ref <150)
VLDL: 49 mg/dL — ABNORMAL HIGH (ref 0–40)

## 2019-02-24 MED ORDER — FUROSEMIDE 20 MG PO TABS: 20.0000 mg | ORAL_TABLET | ORAL | 3 refills | Status: DC

## 2019-02-24 NOTE — Telephone Encounter (Signed)
-----   Message from [name], MD sent at 02/24/2019  1:16 PM EDT ----- Creatinine higher, decrease Lasix to 20 mg every other day and repeat BMET 10 days.

## 2019-02-24 NOTE — Telephone Encounter (Signed)
Pt and wife aware of lab results. Pt advised to take lasix 20mg  every other day and repeat bmet in 10days. Verbalized understanding.

## 2019-02-25 ENCOUNTER — Telehealth (HOSPITAL_COMMUNITY): Payer: Self-pay

## 2019-02-25 DIAGNOSIS — E7800 Pure hypercholesterolemia, unspecified: Secondary | ICD-10-CM

## 2019-02-25 NOTE — Telephone Encounter (Signed)
Wife called regarding questions about itemar test.  Could not find pin, however while on phone she was able to locate pin.  While I had her, labs also discussed.  Advised of results of lipid panel and pt to be referred to lipid clinic for elevated levels. Confirmed patient is on repatha and zetia. Advised she will receive call for an appointment. Verbalized understanding.

## 2019-02-25 NOTE — Telephone Encounter (Signed)
-----   Message from [name], MD sent at 02/25/2019  1:24 PM EDT ----- LDL is still too high.  Make sure he is taking both Repatha and Zetia.  If so, needs evaluation by lipid clinic to see what we can do to get the LDL down lower.

## 2019-02-26 DIAGNOSIS — E230 Hypopituitarism: Secondary | ICD-10-CM | POA: Diagnosis not present

## 2019-02-26 DIAGNOSIS — R21 Rash and other nonspecific skin eruption: Secondary | ICD-10-CM | POA: Diagnosis not present

## 2019-02-26 DIAGNOSIS — E2740 Unspecified adrenocortical insufficiency: Secondary | ICD-10-CM | POA: Diagnosis not present

## 2019-03-02 DIAGNOSIS — R0602 Shortness of breath: Secondary | ICD-10-CM | POA: Diagnosis not present

## 2019-03-02 DIAGNOSIS — J3081 Allergic rhinitis due to animal (cat) (dog) hair and dander: Secondary | ICD-10-CM | POA: Diagnosis not present

## 2019-03-02 DIAGNOSIS — J301 Allergic rhinitis due to pollen: Secondary | ICD-10-CM | POA: Diagnosis not present

## 2019-03-02 DIAGNOSIS — J3089 Other allergic rhinitis: Secondary | ICD-10-CM | POA: Diagnosis not present

## 2019-03-05 ENCOUNTER — Other Ambulatory Visit: Payer: Self-pay

## 2019-03-05 ENCOUNTER — Ambulatory Visit (HOSPITAL_COMMUNITY)
Admission: RE | Admit: 2019-03-05 | Discharge: 2019-03-05 | Disposition: A | Discharge status: Home / Self Care | Payer: Medicare Other | Source: Ambulatory Visit | Attending: Internal Medicine | Admitting: Internal Medicine

## 2019-03-05 DIAGNOSIS — I5032 Chronic diastolic (congestive) heart failure: Secondary | ICD-10-CM

## 2019-03-05 LAB — BASIC METABOLIC PANEL
Anion gap: 11 (ref 5–15)
BUN: 35 mg/dL — ABNORMAL HIGH (ref 8–23)
CO2: 24 mmol/L (ref 22–32)
Calcium: 9.4 mg/dL (ref 8.9–10.3)
Chloride: 107 mmol/L (ref 98–111)
Creatinine, Ser: 1.57 mg/dL — ABNORMAL HIGH (ref 0.61–1.24)
GFR calc Af Amer: 47 mL/min — ABNORMAL LOW (ref >60)
GFR calc non Af Amer: 40 mL/min — ABNORMAL LOW (ref >60)
Glucose, Bld: 110 mg/dL — ABNORMAL HIGH (ref 70–99)
Potassium: 3.8 mmol/L (ref 3.5–5.1)
Sodium: 142 mmol/L (ref 135–145)

## 2019-03-05 NOTE — Addendum Note (Signed)
Encounter addended by: Jovita Kussmaul, RN on: 03/05/2019 11:25 AM  Actions taken: Clinical Note Signed

## 2019-03-05 NOTE — Care Management (Signed)
Lab phlebotomist concerned pt had slight altered status from previous encounters, checked vital signs 128/88 SPO2 98% HR 50bpm, pt answered questions appropriately and denied any dizziness, lightheadedness or pain anywhere. Pt stated he 'felt fine and just didn't sleep well the previous night'. Phlebotomist accompanied pt to car in garage.

## 2019-03-09 ENCOUNTER — Telehealth (HOSPITAL_COMMUNITY): Payer: Self-pay | Admitting: *Deleted

## 2019-03-09 ENCOUNTER — Other Ambulatory Visit (HOSPITAL_COMMUNITY): Payer: Self-pay

## 2019-03-09 NOTE — Telephone Encounter (Signed)
Dr Radford Pax has not read sleep study yet  Anderson Malta please sch [facility] for Wed AM or Friday

## 2019-03-09 NOTE — Telephone Encounter (Signed)
1. Do we have his sleep study result yet?  2. I think that he will need RHC + LHC again if we are going to get to the bottom of his dyspnea in the setting of elevated creatinine on Lasix.  Needs CPX when we can get it done.  However, for now, would see if he can hold apixaban day prior and day of RHC/LHC and arrange for procedure this week if he is agreeable as I will be on vacation next week. Think we need full cardiac evaluation again.

## 2019-03-09 NOTE — Telephone Encounter (Signed)
Pt's wife called concerned about pt's SOB.  She states any little thing he does gets him out of breath and he just "pants"  On 5/26 lasix was decreased to QOD due to elevated kidney function.  She states SOB has been gradually getting worse for several weeks.  Labs done 6/4 showed kidneys back to baseline.  Pt is on the wait list to get CPX once we start performing those again.  Advised would send to Dr Aundra Dubin for further recommendations.

## 2019-03-10 ENCOUNTER — Other Ambulatory Visit (HOSPITAL_COMMUNITY): Payer: Self-pay

## 2019-03-10 ENCOUNTER — Other Ambulatory Visit (HOSPITAL_COMMUNITY)
Admission: RE | Admit: 2019-03-10 | Discharge: 2019-03-10 | Disposition: A | Discharge status: Home / Self Care | Payer: Medicare Other | Source: Ambulatory Visit | Attending: Cardiology | Admitting: Cardiology

## 2019-03-10 ENCOUNTER — Telehealth (HOSPITAL_COMMUNITY): Payer: Self-pay

## 2019-03-10 ENCOUNTER — Encounter (HOSPITAL_COMMUNITY): Payer: Self-pay

## 2019-03-10 ENCOUNTER — Other Ambulatory Visit: Payer: Self-pay

## 2019-03-10 ENCOUNTER — Other Ambulatory Visit (HOSPITAL_COMMUNITY): Payer: Medicare Other

## 2019-03-10 ENCOUNTER — Ambulatory Visit (HOSPITAL_COMMUNITY)
Admission: RE | Admit: 2019-03-10 | Discharge: 2019-03-10 | Disposition: A | Discharge status: Home / Self Care | Payer: Medicare Other | Source: Ambulatory Visit | Attending: Cardiology | Admitting: Cardiology

## 2019-03-10 DIAGNOSIS — I2510 Atherosclerotic heart disease of native coronary artery without angina pectoris: Secondary | ICD-10-CM | POA: Diagnosis not present

## 2019-03-10 DIAGNOSIS — Z01812 Encounter for preprocedural laboratory examination: Secondary | ICD-10-CM | POA: Insufficient documentation

## 2019-03-10 DIAGNOSIS — Z1159 Encounter for screening for other viral diseases: Secondary | ICD-10-CM | POA: Insufficient documentation

## 2019-03-10 DIAGNOSIS — I5032 Chronic diastolic (congestive) heart failure: Secondary | ICD-10-CM

## 2019-03-10 LAB — BASIC METABOLIC PANEL
Anion gap: 10 (ref 5–15)
BUN: 29 mg/dL — ABNORMAL HIGH (ref 8–23)
CO2: 22 mmol/L (ref 22–32)
Calcium: 9.2 mg/dL (ref 8.9–10.3)
Chloride: 109 mmol/L (ref 98–111)
Creatinine, Ser: 1.52 mg/dL — ABNORMAL HIGH (ref 0.61–1.24)
GFR calc Af Amer: 49 mL/min — ABNORMAL LOW (ref >60)
GFR calc non Af Amer: 42 mL/min — ABNORMAL LOW (ref >60)
Glucose, Bld: 105 mg/dL — ABNORMAL HIGH (ref 70–99)
Potassium: 4.2 mmol/L (ref 3.5–5.1)
Sodium: 141 mmol/L (ref 135–145)

## 2019-03-10 LAB — CBC
HCT: 38.9 % — ABNORMAL LOW (ref 39.0–52.0)
Hemoglobin: 13 g/dL (ref 13.0–17.0)
MCH: 31.9 pg (ref 26.0–34.0)
MCHC: 33.4 g/dL (ref 30.0–36.0)
MCV: 95.3 fL (ref 80.0–100.0)
Platelets: 231 10*3/uL (ref 150–400)
RBC: 4.08 MIL/uL — ABNORMAL LOW (ref 4.22–5.81)
RDW: 14.6 % (ref 11.5–15.5)
WBC: 11.6 10*3/uL — ABNORMAL HIGH (ref 4.0–10.5)
nRBC: 0 % (ref 0.0–0.2)

## 2019-03-10 LAB — PROTIME-INR
INR: 1.3 — ABNORMAL HIGH (ref 0.8–1.2)
Prothrombin Time: 16.1 seconds — ABNORMAL HIGH (ref 11.4–15.2)

## 2019-03-10 MED ORDER — SODIUM CHLORIDE 0.9% FLUSH: 3.0000 mL | Freq: Two times a day (BID) | INTRAVENOUS | Status: AC

## 2019-03-10 NOTE — Telephone Encounter (Signed)
Spoke to pt wife to review instructions for lab and pre cath covid testing. Will review with pt during lab visit and give cath instructions.

## 2019-03-10 NOTE — Progress Notes (Signed)
Orders placed for covid testing and right heart cath.

## 2019-03-10 NOTE — Progress Notes (Signed)
Letter for precath instruction given to pt during lab visit.

## 2019-03-11 ENCOUNTER — Ambulatory Visit: Payer: Medicare Other

## 2019-03-11 ENCOUNTER — Ambulatory Visit (INDEPENDENT_AMBULATORY_CARE_PROVIDER_SITE_OTHER): Payer: Medicare Other

## 2019-03-11 DIAGNOSIS — G479 Sleep disorder, unspecified: Secondary | ICD-10-CM | POA: Diagnosis not present

## 2019-03-11 LAB — NOVEL CORONAVIRUS, NAA (HOSP ORDER, SEND-OUT TO REF LAB; TAT 18-24 HRS): SARS-CoV-2, NAA: NOT DETECTED

## 2019-03-12 DIAGNOSIS — R350 Frequency of micturition: Secondary | ICD-10-CM | POA: Diagnosis not present

## 2019-03-12 DIAGNOSIS — N401 Enlarged prostate with lower urinary tract symptoms: Secondary | ICD-10-CM | POA: Diagnosis not present

## 2019-03-12 DIAGNOSIS — N5201 Erectile dysfunction due to arterial insufficiency: Secondary | ICD-10-CM | POA: Diagnosis not present

## 2019-03-13 ENCOUNTER — Ambulatory Visit (HOSPITAL_COMMUNITY)
Admission: RE | Admit: 2019-03-13 | Discharge: 2019-03-13 | Disposition: A | Discharge status: Home / Self Care | Payer: Medicare Other | Attending: Cardiology | Admitting: Cardiology

## 2019-03-13 ENCOUNTER — Encounter (HOSPITAL_COMMUNITY)
Admission: RE | Disposition: A | Discharge status: Home / Self Care | Payer: Self-pay | Source: Home / Self Care | Attending: Cardiology

## 2019-03-13 ENCOUNTER — Other Ambulatory Visit: Payer: Self-pay

## 2019-03-13 DIAGNOSIS — I428 Other cardiomyopathies: Secondary | ICD-10-CM | POA: Insufficient documentation

## 2019-03-13 DIAGNOSIS — J45909 Unspecified asthma, uncomplicated: Secondary | ICD-10-CM | POA: Insufficient documentation

## 2019-03-13 DIAGNOSIS — G4733 Obstructive sleep apnea (adult) (pediatric): Secondary | ICD-10-CM | POA: Insufficient documentation

## 2019-03-13 DIAGNOSIS — I6521 Occlusion and stenosis of right carotid artery: Secondary | ICD-10-CM | POA: Diagnosis not present

## 2019-03-13 DIAGNOSIS — E785 Hyperlipidemia, unspecified: Secondary | ICD-10-CM | POA: Diagnosis not present

## 2019-03-13 DIAGNOSIS — E039 Hypothyroidism, unspecified: Secondary | ICD-10-CM | POA: Diagnosis not present

## 2019-03-13 DIAGNOSIS — R008 Other abnormalities of heart beat: Secondary | ICD-10-CM | POA: Diagnosis not present

## 2019-03-13 DIAGNOSIS — Z79890 Hormone replacement therapy (postmenopausal): Secondary | ICD-10-CM | POA: Insufficient documentation

## 2019-03-13 DIAGNOSIS — Z7982 Long term (current) use of aspirin: Secondary | ICD-10-CM | POA: Diagnosis not present

## 2019-03-13 DIAGNOSIS — N183 Chronic kidney disease, stage 3 (moderate): Secondary | ICD-10-CM | POA: Insufficient documentation

## 2019-03-13 DIAGNOSIS — I5032 Chronic diastolic (congestive) heart failure: Secondary | ICD-10-CM

## 2019-03-13 DIAGNOSIS — R0602 Shortness of breath: Secondary | ICD-10-CM | POA: Insufficient documentation

## 2019-03-13 DIAGNOSIS — Z7901 Long term (current) use of anticoagulants: Secondary | ICD-10-CM | POA: Insufficient documentation

## 2019-03-13 DIAGNOSIS — I252 Old myocardial infarction: Secondary | ICD-10-CM | POA: Insufficient documentation

## 2019-03-13 DIAGNOSIS — Z79899 Other long term (current) drug therapy: Secondary | ICD-10-CM | POA: Diagnosis not present

## 2019-03-13 DIAGNOSIS — Z7951 Long term (current) use of inhaled steroids: Secondary | ICD-10-CM | POA: Diagnosis not present

## 2019-03-13 DIAGNOSIS — I2510 Atherosclerotic heart disease of native coronary artery without angina pectoris: Secondary | ICD-10-CM | POA: Insufficient documentation

## 2019-03-13 DIAGNOSIS — Z882 Allergy status to sulfonamides status: Secondary | ICD-10-CM | POA: Insufficient documentation

## 2019-03-13 DIAGNOSIS — Z7902 Long term (current) use of antithrombotics/antiplatelets: Secondary | ICD-10-CM | POA: Diagnosis not present

## 2019-03-13 DIAGNOSIS — Z955 Presence of coronary angioplasty implant and graft: Secondary | ICD-10-CM | POA: Insufficient documentation

## 2019-03-13 DIAGNOSIS — Z8249 Family history of ischemic heart disease and other diseases of the circulatory system: Secondary | ICD-10-CM | POA: Insufficient documentation

## 2019-03-13 DIAGNOSIS — R0609 Other forms of dyspnea: Secondary | ICD-10-CM | POA: Diagnosis not present

## 2019-03-13 HISTORY — PX: RIGHT/LEFT HEART CATH AND CORONARY ANGIOGRAPHY: CATH118266

## 2019-03-13 LAB — POCT I-STAT EG7
Acid-base deficit: 1 mmol/L (ref 0.0–2.0)
Bicarbonate: 24.1 mmol/L (ref 20.0–28.0)
Bicarbonate: 24.6 mmol/L (ref 20.0–28.0)
Calcium, Ion: 1.17 mmol/L (ref 1.15–1.40)
Calcium, Ion: 1.27 mmol/L (ref 1.15–1.40)
HCT: 34 % — ABNORMAL LOW (ref 39.0–52.0)
HCT: 35 % — ABNORMAL LOW (ref 39.0–52.0)
Hemoglobin: 11.6 g/dL — ABNORMAL LOW (ref 13.0–17.0)
Hemoglobin: 11.9 g/dL — ABNORMAL LOW (ref 13.0–17.0)
O2 Saturation: 70 %
O2 Saturation: 72 %
Potassium: 3.6 mmol/L (ref 3.5–5.1)
Potassium: 3.7 mmol/L (ref 3.5–5.1)
Sodium: 142 mmol/L (ref 135–145)
Sodium: 143 mmol/L (ref 135–145)
TCO2: 25 mmol/L (ref 22–32)
TCO2: 26 mmol/L (ref 22–32)
pCO2, Ven: 38.9 mmHg — ABNORMAL LOW (ref 44.0–60.0)
pCO2, Ven: 39.8 mmHg — ABNORMAL LOW (ref 44.0–60.0)
pH, Ven: 7.399 (ref 7.250–7.430)
pH, Ven: 7.4 (ref 7.250–7.430)
pO2, Ven: 37 mmHg (ref 32.0–45.0)
pO2, Ven: 38 mmHg (ref 32.0–45.0)

## 2019-03-13 SURGERY — RIGHT/LEFT HEART CATH AND CORONARY ANGIOGRAPHY
Anesthesia: LOCAL

## 2019-03-13 MED ORDER — ASPIRIN 81 MG PO CHEW
CHEWABLE_TABLET | ORAL | Status: AC
  Filled 2019-03-13: qty 1

## 2019-03-13 MED ORDER — SODIUM CHLORIDE 0.9% FLUSH: 3.0000 mL | INTRAVENOUS | Status: DC | PRN

## 2019-03-13 MED ORDER — HEPARIN (PORCINE) IN NACL 1000-0.9 UT/500ML-% IV SOLN
INTRAVENOUS | Status: AC
  Filled 2019-03-13: qty 1000

## 2019-03-13 MED ORDER — ONDANSETRON HCL 4 MG/2ML IJ SOLN: 4.0000 mg | Freq: Four times a day (QID) | INTRAMUSCULAR | Status: DC | PRN

## 2019-03-13 MED ORDER — LIDOCAINE HCL (PF) 1 % IJ SOLN
INTRAMUSCULAR | Status: DC | PRN
  Administered 2019-03-13: 2 mL
  Administered 2019-03-13: 2 mL via INTRADERMAL

## 2019-03-13 MED ORDER — IOHEXOL 350 MG/ML SOLN
INTRAVENOUS | Status: DC | PRN
  Administered 2019-03-13: 50 mL via INTRA_ARTERIAL

## 2019-03-13 MED ORDER — SODIUM CHLORIDE 0.9 % IV SOLN
INTRAVENOUS | Status: DC
  Administered 2019-03-13: 13:00:00 via INTRAVENOUS

## 2019-03-13 MED ORDER — VERAPAMIL HCL 2.5 MG/ML IV SOLN
INTRAVENOUS | Status: DC | PRN
  Administered 2019-03-13: 10 mL via INTRA_ARTERIAL

## 2019-03-13 MED ORDER — HEPARIN (PORCINE) IN NACL 1000-0.9 UT/500ML-% IV SOLN
INTRAVENOUS | Status: DC | PRN
  Administered 2019-03-13 (×2): 500 mL

## 2019-03-13 MED ORDER — HEPARIN SODIUM (PORCINE) 1000 UNIT/ML IJ SOLN
INTRAMUSCULAR | Status: AC
  Filled 2019-03-13: qty 1

## 2019-03-13 MED ORDER — HYDRALAZINE HCL 20 MG/ML IJ SOLN: 10.0000 mg | INTRAMUSCULAR | Status: DC | PRN

## 2019-03-13 MED ORDER — SODIUM CHLORIDE 0.9 % IV SOLN: 250.0000 mL | INTRAVENOUS | Status: DC | PRN

## 2019-03-13 MED ORDER — SODIUM CHLORIDE 0.9 % WEIGHT BASED INFUSION: 1.0000 mL/kg/h | INTRAVENOUS | Status: DC

## 2019-03-13 MED ORDER — VERAPAMIL HCL 2.5 MG/ML IV SOLN
INTRAVENOUS | Status: AC
  Filled 2019-03-13: qty 2

## 2019-03-13 MED ORDER — HEPARIN SODIUM (PORCINE) 1000 UNIT/ML IJ SOLN
INTRAMUSCULAR | Status: DC | PRN
  Administered 2019-03-13: 5000 [IU] via INTRAVENOUS

## 2019-03-13 MED ORDER — MIDAZOLAM HCL 2 MG/2ML IJ SOLN
INTRAMUSCULAR | Status: DC | PRN
  Administered 2019-03-13 (×2): 1 mg via INTRAVENOUS

## 2019-03-13 MED ORDER — FENTANYL CITRATE (PF) 100 MCG/2ML IJ SOLN
INTRAMUSCULAR | Status: DC | PRN
  Administered 2019-03-13 (×2): 25 ug via INTRAVENOUS

## 2019-03-13 MED ORDER — LIDOCAINE HCL (PF) 1 % IJ SOLN
INTRAMUSCULAR | Status: AC
  Filled 2019-03-13: qty 30

## 2019-03-13 MED ORDER — RANOLAZINE ER 500 MG PO TB12: 500.0000 mg | ORAL_TABLET | Freq: Two times a day (BID) | ORAL | 6 refills | Status: DC

## 2019-03-13 MED ORDER — MIDAZOLAM HCL 2 MG/2ML IJ SOLN
INTRAMUSCULAR | Status: AC
  Filled 2019-03-13: qty 2

## 2019-03-13 MED ORDER — FENTANYL CITRATE (PF) 100 MCG/2ML IJ SOLN
INTRAMUSCULAR | Status: AC
  Filled 2019-03-13: qty 2

## 2019-03-13 MED ORDER — LABETALOL HCL 5 MG/ML IV SOLN: 10.0000 mg | INTRAVENOUS | Status: DC | PRN

## 2019-03-13 MED ORDER — ASPIRIN 81 MG PO CHEW
81.0000 mg | CHEWABLE_TABLET | ORAL | Status: AC
  Administered 2019-03-13: 81 mg via ORAL

## 2019-03-13 MED ORDER — ACETAMINOPHEN 325 MG PO TABS: 650.0000 mg | ORAL_TABLET | ORAL | Status: DC | PRN

## 2019-03-13 MED ORDER — SODIUM CHLORIDE 0.9% FLUSH: 3.0000 mL | Freq: Two times a day (BID) | INTRAVENOUS | Status: DC

## 2019-03-13 SURGICAL SUPPLY — 12 items
CATH 5FR JL3.5 JR4 ANG PIG MP (CATHETERS) ×1 IMPLANT
CATH BALLN WEDGE 5F 110CM (CATHETERS) ×1 IMPLANT
CATH INFINITI 5 FR 3DRC (CATHETERS) ×1 IMPLANT
DEVICE RAD COMP TR BAND LRG (VASCULAR PRODUCTS) ×1 IMPLANT
GLIDESHEATH SLEND SS 6F .021 (SHEATH) ×1 IMPLANT
GUIDEWIRE INQWIRE 1.5J.035X260 (WIRE) IMPLANT
INQWIRE 1.5J .035X260CM (WIRE) ×2
KIT HEART LEFT (KITS) ×2 IMPLANT
PACK CARDIAC CATHETERIZATION (CUSTOM PROCEDURE TRAY) ×2 IMPLANT
SHEATH GLIDE SLENDER 4/5FR (SHEATH) ×1 IMPLANT
TRANSDUCER W/STOPCOCK (MISCELLANEOUS) ×2 IMPLANT
TUBING CIL FLEX 10 FLL-RA (TUBING) ×2 IMPLANT

## 2019-03-13 NOTE — Discharge Instructions (Signed)
Drink plenty of fluids  °Keep right arm at or above heart level.  °Radial Site Care ° °This sheet gives you information about how to care for yourself after your procedure. Your health care provider may also give you more specific instructions. If you have problems or questions, contact your health care provider. °What can I expect after the procedure? °After the procedure, it is common to have: °· Bruising and tenderness at the catheter insertion area. °Follow these instructions at home: °Medicines °· Take over-the-counter and prescription medicines only as told by your health care provider. °Insertion site care °· Follow instructions from your health care provider about how to take care of your insertion site. Make sure you: °? Wash your hands with soap and water before you change your bandage (dressing). If soap and water are not available, use hand sanitizer. °? Change your dressing as told by your health care provider. °? Leave stitches (sutures), skin glue, or adhesive strips in place. These skin closures may need to stay in place for 2 weeks or longer. If adhesive strip edges start to loosen and curl up, you may trim the loose edges. Do not remove adhesive strips completely unless your health care provider tells you to do that. °· Check your insertion site every day for signs of infection. Check for: °? Redness, swelling, or pain. °? Fluid or blood. °? Pus or a bad smell. °? Warmth. °· Do not take baths, swim, or use a hot tub until your health care provider approves. °· You may shower 24-48 hours after the procedure, or as directed by your health care provider. °? Remove the dressing and gently wash the site with plain soap and water. °? Pat the area dry with a clean towel. °? Do not rub the site. That could cause bleeding. °· Do not apply powder or lotion to the site. °Activity ° °· For 24 hours after the procedure, or as directed by your health care provider: °? Do not flex or bend the affected arm. °? Do  not push or pull heavy objects with the affected arm. °? Do not drive yourself home from the hospital or clinic. You may drive 24 hours after the procedure unless your health care provider tells you not to. °? Do not operate machinery or power tools. °· Do not lift anything that is heavier than 10 lb (4.5 kg), or the limit that you are told, until your health care provider says that it is safe. °· Ask your health care provider when it is okay to: °? Return to work or school. °? Resume usual physical activities or sports. °? Resume sexual activity. °General instructions °· If the catheter site starts to bleed, raise your arm and put firm pressure on the site. If the bleeding does not stop, get help right away. This is a medical emergency. °· If you went home on the same day as your procedure, a responsible adult should be with you for the first 24 hours after you arrive home. °· Keep all follow-up visits as told by your health care provider. This is important. °Contact a health care provider if: °· You have a fever. °· You have redness, swelling, or yellow drainage around your insertion site. °Get help right away if: °· You have unusual pain at the radial site. °· The catheter insertion area swells very fast. °· The insertion area is bleeding, and the bleeding does not stop when you hold steady pressure on the area. °· Your arm or   hand becomes pale, cool, tingly, or numb. °These symptoms may represent a serious problem that is an emergency. Do not wait to see if the symptoms will go away. Get medical help right away. Call your local emergency services (911 in the [place] Do not drive yourself to the hospital. °Summary °· After the procedure, it is common to have bruising and tenderness at the site. °· Follow instructions from your health care provider about how to take care of your radial site wound. Check the wound every day for signs of infection. °· Do not lift anything that is heavier than 10 lb (4.5 kg), or the  limit that you are told, until your health care provider says that it is safe. °This information is not intended to replace advice given to you by your health care provider. Make sure you discuss any questions you have with your health care provider. °Document Released: 10/20/2010 Document Revised: 10/23/2017 Document Reviewed: 10/23/2017 °Elsevier Interactive Patient Education © 2019 Elsevier Inc. ° °

## 2019-03-13 NOTE — Progress Notes (Signed)
Ambulated to bathroom to void.

## 2019-03-13 NOTE — Interval H&P Note (Signed)
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  The various methods of treatment have been discussed with the patient and family. After consideration of risks, benefits and other options for treatment, the patient has consented to  Procedure(s): RIGHT/LEFT HEART CATH AND CORONARY ANGIOGRAPHY (N/A) as a surgical intervention.  The patient's history has been reviewed, patient examined, no change in status, stable for surgery.  I have reviewed the patient's chart and labs.  Questions were answered to the patient's satisfaction.     Sekai Nayak [employer]

## 2019-03-13 NOTE — Progress Notes (Signed)
Discharge instructions reviewed with pt and his wife via the telephone. Voices understanding.

## 2019-03-13 NOTE — Progress Notes (Signed)
Unable to obtain IV in right AC . Iv placed in left a/c but unable to advance the cath all the way. Kim in cath lab informed.  States to bring him over and they will get IV there if needed.

## 2019-03-16 ENCOUNTER — Other Ambulatory Visit: Payer: Self-pay

## 2019-03-16 ENCOUNTER — Encounter (HOSPITAL_COMMUNITY): Payer: Self-pay | Admitting: Cardiology

## 2019-03-18 ENCOUNTER — Other Ambulatory Visit (HOSPITAL_COMMUNITY): Payer: Self-pay | Admitting: Internal Medicine

## 2019-03-18 ENCOUNTER — Telehealth (HOSPITAL_COMMUNITY): Payer: Self-pay

## 2019-03-18 DIAGNOSIS — E291 Testicular hypofunction: Secondary | ICD-10-CM | POA: Diagnosis not present

## 2019-03-18 DIAGNOSIS — I5032 Chronic diastolic (congestive) heart failure: Secondary | ICD-10-CM

## 2019-03-18 MED ORDER — ZOLPIDEM TARTRATE 5 MG PO TABS: 5.0000 mg | ORAL_TABLET | Freq: Once | ORAL | 0 refills | Status: DC

## 2019-03-18 NOTE — Telephone Encounter (Signed)
-----   Message from [name], MD sent at 03/16/2019  8:34 PM EDT ----- Regarding: FW: Can you set up repeat home sleep study with ambien 5 mg that night? His initial study was nondiagnostic (let him know) Thanks ----- Message ----- From: Sueanne Margarita, MD Sent: 03/16/2019   7:31 PM EDT To: [name], MD  Please see note I sent you attached to his study - only slept 2 hours and not sufficient for adequate diagnosis.   Needs repeat home sleep study or sleep study in sleep lab with sleep aide  Traci ----- Message ----- From: [name], MD Sent: 03/13/2019   2:23 PM EDT To: Sueanne Margarita, MD  This patient is calling me about his home sleep study.  Is it ready?

## 2019-03-18 NOTE — Telephone Encounter (Signed)
Orders resent to [facility] for repeat sleep study with STOP BANG assessment, OV note, demographics.  Pts wife aware of need for repeat study.  Ambien 5mg  ordered for 1 dose to assist with sleep.  Verbalized understanding.  Spoke with rep [employer] of need for repeat study. Emailed patient information with reasoning for study.

## 2019-03-19 ENCOUNTER — Telehealth: Payer: Self-pay | Admitting: *Deleted

## 2019-03-19 NOTE — Telephone Encounter (Signed)
-----   Message from Sueanne Margarita, MD sent at 03/16/2019  7:07 PM EDT ----- Please let patient know that he did not have adequate sleep time for accurate study.  We can either repeat this study or order an in lab sleep study with sleep aide - please find out from patient which way he would like to proceed

## 2019-03-19 NOTE — Telephone Encounter (Signed)
Informed per dpr Wife/patient of sleep study results and understanding was verbalized. Wife/Patient understands his sleep study showed that he did not have adequate sleep time for accurate study. She understands we can either repeat this study or order an in lab sleep study with sleep aide. Wife states her husband does not go to sleep before 1:00 am ever. Wife states Dr Oleh Genin office called to offer a repeat sleep study at home with a sleep aide but no decision has been made as of yet, she is waiting to hear back from them. Wife states her husband only slept 2 hours in the lab, they are willing to try at home with a sleep aide.

## 2019-03-20 ENCOUNTER — Encounter: Payer: Self-pay | Admitting: *Deleted

## 2019-03-20 NOTE — Telephone Encounter (Signed)
This encounter was created in error - please disregard.

## 2019-03-23 DIAGNOSIS — M9904 Segmental and somatic dysfunction of sacral region: Secondary | ICD-10-CM | POA: Diagnosis not present

## 2019-03-23 DIAGNOSIS — M5431 Sciatica, right side: Secondary | ICD-10-CM | POA: Diagnosis not present

## 2019-03-23 DIAGNOSIS — M9903 Segmental and somatic dysfunction of lumbar region: Secondary | ICD-10-CM | POA: Diagnosis not present

## 2019-03-23 DIAGNOSIS — M545 Low back pain: Secondary | ICD-10-CM | POA: Diagnosis not present

## 2019-03-27 ENCOUNTER — Telehealth: Payer: Self-pay | Admitting: Internal Medicine

## 2019-03-27 NOTE — Telephone Encounter (Signed)
TELE- VISIT per patient/ consent/ my chart/ pre reg completed

## 2019-03-29 ENCOUNTER — Other Ambulatory Visit (HOSPITAL_COMMUNITY): Payer: Self-pay | Admitting: Cardiology

## 2019-03-30 ENCOUNTER — Telehealth: Payer: Self-pay | Admitting: Pulmonary Disease

## 2019-03-30 NOTE — Telephone Encounter (Signed)
Whatever the office visitation rules are. I am fine with the wife being there as long as we are complying with the rules.  Thanks  BLI

## 2019-03-30 NOTE — Telephone Encounter (Signed)
Called and spoke with pt's wife [name] who stated pt has been having more problems with SOB. At [facility] in November 2019 with Dr. Valeta Harms, Dr. Valeta Harms had discussed possibly changing pt's inhaler but then at that [place], pt was noticed to possibly have blood clots and an immediate appt had been scheduled for pt to have the legs and other things checked out. Pt did have blood clots and then pt was admitted to hospital and then placed on a blood thinner.  After holidays, [name] stated pt's breathing started getting better but then after Covid, pt stopped going to the gym and after activities had been stopped, pt's breathing became worse. Pt went to see cards and had heart cath performed 6/12.  Pt denies any complaints of fever and pt did have to be tested for COVID prior to the heart cath and the COVID test was negative 6/9.  [name] also stated that pt's meds and inhalers have also been changed multiple times and she wonders if this could be contributing to pt's breathing as well. I stated to [name] that we needed to get pt scheduled for an appt to reassess his breathing and she verbalized understanding.  Pt has been scheduled for appt with Dr. Valeta Harms 7/2 at 11:45. [name] is asking if it is okay for her to come back to appt with pt due to having memory issues. Dr. Valeta Harms, please advise if you are okay for pt's wife [name] coming back with pt to appt. Thanks!

## 2019-03-30 NOTE — Telephone Encounter (Signed)
Spoke with wife and informed her it was ok to be there as long as she was asymptomatic.

## 2019-04-01 ENCOUNTER — Telehealth: Payer: Self-pay

## 2019-04-01 ENCOUNTER — Encounter: Payer: Self-pay | Admitting: Internal Medicine

## 2019-04-01 ENCOUNTER — Other Ambulatory Visit (HOSPITAL_COMMUNITY): Payer: Self-pay

## 2019-04-01 ENCOUNTER — Telehealth (INDEPENDENT_AMBULATORY_CARE_PROVIDER_SITE_OTHER): Payer: Medicare Other | Admitting: Internal Medicine

## 2019-04-01 VITALS — BP 96/88 | HR 89 | Ht 76.0 in | Wt 235.0 lb

## 2019-04-01 DIAGNOSIS — I2510 Atherosclerotic heart disease of native coronary artery without angina pectoris: Secondary | ICD-10-CM | POA: Diagnosis not present

## 2019-04-01 DIAGNOSIS — E782 Mixed hyperlipidemia: Secondary | ICD-10-CM

## 2019-04-01 DIAGNOSIS — I5032 Chronic diastolic (congestive) heart failure: Secondary | ICD-10-CM | POA: Diagnosis not present

## 2019-04-01 DIAGNOSIS — Z789 Other specified health status: Secondary | ICD-10-CM

## 2019-04-01 MED ORDER — EZETIMIBE 10 MG PO TABS: ORAL_TABLET | ORAL | 3 refills | Status: DC

## 2019-04-01 MED ORDER — ICOSAPENT ETHYL 1 G PO CAPS: 2.0000 g | ORAL_CAPSULE | Freq: Two times a day (BID) | ORAL | 11 refills | Status: DC

## 2019-04-01 NOTE — Telephone Encounter (Signed)
LMTCB. Please inform patient we need him to come in at 10:30am instead of 11:45am. Please block the 12 o'clock spot once the 1130 appt has been cancelled.

## 2019-04-01 NOTE — Progress Notes (Signed)
Virtual Visit via Telephone Note   This visit type was conducted due to national recommendations for restrictions regarding the COVID-19 Pandemic (e.g. social distancing) in an effort to limit this patient's exposure and mitigate transmission in our community.  Due to his co-morbid illnesses, this patient is at least at moderate risk for complications without adequate follow up.  This format is felt to be most appropriate for this patient at this time.  The patient did not have access to video technology/had technical difficulties with video requiring transitioning to audio format only (telephone).  All issues noted in this document were discussed and addressed.  No physical exam could be performed with this format.  Please refer to the patient's chart for his  consent to telehealth for [facility].   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  This is a pleasant 85 year old male patient of Dr. Aundra Dubin is kindly referred for management of dyslipidemia.  He has a number of medical problems including a history of coronary artery disease and persistent dyspnea on exertion.  Recently, in fact a couple weeks ago he underwent repeat left and right heart catheterization.  This showed no new significant obstructive coronary disease and possible mild aortic stenosis.  Filling pressures and cardiac output were normal.  He was placed on a trial of renal dosing for symptomatic relief however it does not seem at this  point he is had any significant improvement.  This was supposedly for 2 weeks.  He has continued the medication.  Plan was for cardiopulmonary exercise testing.  From a lipid standpoint, he has a history of statin intolerance.  He has been on Repatha and ezetimibe.  About a year ago he had much better control of his lipids where his total cholesterol was 138, triglycerides 116 and LDL of 70.  Prior to that total cholesterol was 204 with a LDL of 130.  Since then his LDL has continued to creep up now at 106 with triglycerides rising accordingly to 243.  He denies any significant dietary changes however I suspect this is probably representative of differences in diet and decreased activity due to his significant shortness of breath.  As his LDL and triglycerides remain above goal he was referred for additional therapy options.  The patient does not have symptoms concerning for COVID-19 infection (fever, chills, cough, or new SHORTNESS OF BREATH).    Prior CV studies:   The following studies were reviewed today:  Chart reviewed Lab work  PMHx:  Past Medical History:  Diagnosis Date   Atrial premature beats    BPH (benign prostatic hyperplasia)    BPH (benign prostatic hypertrophy)    Cataract, right eye    Chronic low back pain    Colon polyp    Coronary artery disease    DDD (degenerative disc disease), lumbar    Dermatitis 11/16   Elevated IgE level 06/30/2018   Hyperlipidemia    Insomnia    Kidney cysts  Memory loss 2015   Non-ischemic cardiomyopathy (HCC)    NSTEMI (non-ST elevated myocardial infarction) ([place]) 08/25/2014   Osteoarthritis of right knee    Pituitary tumor 2011   macroadenoma w/ VF compromise-then gamma knife a few years leater for some recurrence   Positive radioallergosorbent test (RAST) 06/30/2018   Status post insertion of drug-eluting stent into left anterior descending (LAD) artery 08/28/15   + nuc study.    Urinary retention    Vitamin  D deficiency 01/2008    Past Surgical History:  Procedure Laterality Date   ARTHROSCOPIC SURGERY     BACK SURGERY     CARDIAC CATHETERIZATION  07/27/2015   Dr Aundra Dubin; oLAD 95%, mLAD 50%, CFX stent OK, OM1 60%, OM2 99%, pPLOM 50%, RCA irregular, AM2 70%, EF 55-60%, no regional wall motion abnormalities.      CARDIAC CATHETERIZATION N/A 07/28/2015   Procedure: Left Heart Cath and Coronary Angiography;  Surgeon: [name], MD;  Location: [facility];  Service: Cardiovascular;  Laterality: N/A;   CARDIAC CATHETERIZATION N/A 07/28/2015   Procedure: Coronary Stent Intervention;  Surgeon: Troy Sine, MD;  Location: [facility];  Service: Cardiovascular;  Laterality: N/A;   CATARACT EXTRACTION     CORONARY ANGIOPLASTY WITH STENT PLACEMENT  07/27/2015   XIENCE ALPINE RX 3.0X33 DES to the LAD, 95%>>0   HAMMER  BUNION TOE SURGERY     HAND SURGERY Left    LEFT HEART CATHETERIZATION WITH CORONARY ANGIOGRAM N/A 08/25/2014   Procedure: LEFT HEART CATHETERIZATION WITH CORONARY ANGIOGRAM;  Surgeon: Burnell Blanks, MD;  Location: [facility] CATH LAB;  Service: Cardiovascular;  Laterality: N/A;   PITUITARY SURGERY     12/01/09 Dr. Wilburn Cornelia and [name].   RIGHT/LEFT HEART CATH AND CORONARY ANGIOGRAPHY N/A 03/19/2018   Procedure: RIGHT/LEFT HEART CATH AND CORONARY ANGIOGRAPHY;  Surgeon: [name], MD;  Location: [facility];  Service: Cardiovascular;  Laterality: N/A;   RIGHT/LEFT HEART CATH AND CORONARY ANGIOGRAPHY N/A 03/13/2019   Procedure: RIGHT/LEFT HEART CATH AND CORONARY ANGIOGRAPHY;  Surgeon: [name], MD;  Location: [facility];  Service: Cardiovascular;  Laterality: N/A;    FAMHx:  Family History  Problem Relation Age of Onset   Congestive Heart Failure Father 48   Other Mother 68   Coronary artery disease Brother    Other Sister 38       polio   Heart attack Brother        MI/ASCAD 2006   COPD Sister 75    SOCHx:    reports that he has never smoked. He has never used smokeless tobacco. He reports current alcohol use. He reports that he does not use drugs.  ALLERGIES:  Allergies  Allergen Reactions   Sulfa Antibiotics Anaphylaxis, Nausea And Vomiting and Swelling   Sulfamethoxazole Anaphylaxis, Nausea And Vomiting and Swelling   Sulfonamide Derivatives Anaphylaxis, Nausea And Vomiting and Swelling   Bee Venom Rash   Flonase [Fluticasone] Other (See Comments)    Caused nose bleeds   Tamsulosin Other (See Comments)    Unknown reaction- Patient doesn't think he is allergic (??)    MEDS:  Current Meds  Medication Sig   albuterol (VENTOLIN HFA) 108 (90 Base) MCG/ACT inhaler Inhale 2 puffs into the lungs every 6 (six) hours as needed for wheezing or shortness of breath.   alfuzosin (UROXATRAL) 10 MG 24 hr tablet Take 1 tablet (10 mg total) by mouth daily with breakfast. (Patient taking differently:  Take 10 mg by mouth at bedtime. )   apixaban (ELIQUIS) 2.5 MG TABS tablet Take 1 tablet (2.5 mg total) by mouth 2 (two) times daily.   aspirin EC 81 MG tablet Take 1 tablet (81 mg total) by mouth daily.   buPROPion (WELLBUTRIN XL) 150 MG 24 hr tablet Take 1 tablet by mouth daily.   cabergoline (DOSTINEX) 0.5 MG tablet Take 0.5 mg by mouth once a week.   carvedilol (COREG) 12.5 MG tablet Take 12.5 mg by mouth 2 (two) times daily with a meal.   Evolocumab (REPATHA) 140 MG/ML SOSY Inject into the skin daily.   ezetimibe (ZETIA) 10 MG tablet TAKE 1 TABLET(10 MG) BY MOUTH DAILY   FLOVENT HFA 220 MCG/ACT inhaler Inhale 2 puffs into the lungs 2 (two) times a day.    fluticasone (FLONASE SENSIMIST) 27.5 MCG/SPRAY nasal spray Place 2 sprays into the nose daily.   furosemide (LASIX) 20 MG tablet Take 1 tablet (20 mg total) by mouth every other day.   hydrocortisone (CORTEF) 20 MG tablet Take 20 mg by mouth 2 (two) times daily.   levothyroxine (SYNTHROID, LEVOTHROID) 150 MCG tablet Take 150 mcg by  mouth at bedtime.    loratadine (CLARITIN) 10 MG tablet Take 10 mg by mouth daily.   montelukast (SINGULAIR) 10 MG tablet Take 1 tablet (10 mg total) by mouth at bedtime.   MYRBETRIQ 50 MG TB24 tablet Take 50 mg by mouth at bedtime.    nebivolol (BYSTOLIC) 2.5 MG tablet Take 0.5 tablets (1.25 mg total) by mouth daily.   nitroGLYCERIN (NITROSTAT) 0.4 MG SL tablet Place 1 tablet (0.4 mg total) under the tongue every 5 (five) minutes as needed for chest pain.   omeprazole (PRILOSEC) 40 MG capsule Take 40 mg by mouth daily.   potassium chloride (K-DUR) 10 MEQ tablet Take 2 tablets (20 mEq total) by mouth daily.   ranolazine (RANEXA) 500 MG 12 hr tablet Take 1 tablet (500 mg total) by mouth 2 (two) times daily.   Red Yeast Rice Extract (RED YEAST RICE PO) Take by mouth daily.   testosterone cypionate (DEPOTESTOTERONE CYPIONATE) 200 MG/ML injection Inject into the muscle every 21 ( twenty-one) days. 1.62 % injection   triamcinolone cream (KENALOG) 0.1 % Apply 1 application topically 2 (two) times daily.   zolpidem (AMBIEN) 5 MG tablet Take 1 tablet (5 mg total) by mouth once for 1 dose. Take night of sleep study.     ROS: Pertinent items noted in HPI and remainder of comprehensive ROS otherwise negative.  Labs/Other Tests and Data Reviewed:    Recent Labs: 07/17/2018: B Natriuretic Peptide 138.7 03/10/2019: BUN 29; Creatinine, Ser 1.52; Platelets 231 03/13/2019: Hemoglobin 11.9; Hemoglobin 11.6; Potassium 3.7; Potassium 3.6; Sodium 142; Sodium 143   Recent Lipid Panel Lab Results  Component Value Date/Time   CHOL 198 02/24/2019 12:15 PM   CHOL 147 08/11/2015 08:06 AM   TRIG 243 (H) 02/24/2019 12:15 PM   TRIG 186 (H) 08/11/2015 08:06 AM   HDL 43 02/24/2019 12:15 PM   HDL 33 (L) 08/11/2015 08:06 AM   CHOLHDL 4.6 02/24/2019 12:15 PM   LDLCALC 106 (H) 02/24/2019 12:15 PM   LDLCALC 77 08/11/2015 08:06 AM   LDLDIRECT 155.0 11/11/2014 08:49 AM    Wt Readings from Last 3  Encounters:  04/01/19 235 lb (106.6 kg)  03/13/19 235 lb (106.6 kg)  11/24/18 246 lb (111.6 kg)     Exam:    Vital Signs:  BP 96/88  Pulse 89    Ht 6\' 4"  (1.93 m)    Wt 235 lb (106.6 kg)    BMI 28.61 kg/m    Exam not performed due to telephone visit  ASSESSMENT & PLAN:    1. Mixed dyslipidemia 2. Coronary artery disease prior interventions to the circumflex and LAD 3. Ischemic cardiomyopathy with EF 40%-improved to 50 to 55% 4. Persistent dyspnea on exertion 5. Bilateral carotid artery disease  Mr.Mattern has a persistent mixed dyslipidemia which I suspect represents worsening due to diet and decreased activity due to progressive shortness of breath.  He has been compliant with Repatha and seems to be using the medication correctly.  He is also on ezetimibe.  We talked about options for adjusting his diet to improve triglycerides.  While lowering his LDL to target below 70 would be ideal, at this point, he has few other medication options since he has been intolerant to the statins.  He does seem to take a red yeast rice over-the-counter.  He is on ezetimibe and Repatha.  One option could be Nexletol, however we do not have any outcome data with this medication yet.  The other option would be targeting elevated triglycerides.  This could get at persistent risk.  There is very strong data based on the REDUCE-IT trial that Vascepa in this patient could be associated with up to 30% cardiovascular risk reduction.  I think this is the best additional option for him at this time.  I advised him to stop over-the-counter fish oil and will get prior authorization for Vascepa which is indicated due to persistently elevated triglycerides over 150 in a patient who is on maximally tolerated LDL lowering therapy with known cardiovascular disease.  Thanks for the kind referral.  Plan follow-up with a lipid profile and me in the office in 3 months.  COVID-19 Education: The signs and symptoms of COVID-19  were discussed with the patient and how to seek care for testing (follow up with PCP or arrange E-visit).  The importance of social distancing was discussed today.  Patient Risk:   After full review of this patients clinical status, I feel that they are at least moderate risk at this time.  Time:   Today, I have spent 25 minutes with the patient with telehealth technology discussing dyslipidemia, persistent cardiovascular risk, cardiovascular disease, statin intolerance, dyspnea.     Medication Adjustments/Labs and Tests Ordered: Current medicines are reviewed at length with the patient today.  Concerns regarding medicines are outlined above.   Tests Ordered: Orders Placed This Encounter  Procedures   Lipid panel    Medication Changes: Meds ordered this encounter  Medications   Icosapent Ethyl 1 g CAPS    Sig: Take 2 capsules (2 g total) by mouth 2 (two) times a day.    Dispense:  120 capsule    Refill:  11    Disposition:  in 3 month(s)  Pixie Casino, MD, [facility] [place] Director of the Advanced Lipid Disorders &  Cardiovascular Risk Reduction Clinic Diplomate of the American Board of Clinical Lipidology Attending Cardiologist  Direct Dial: [phone]   Fax: [phone]  Website:  [email or web address]  Pixie Casino, MD  04/01/2019 8:32 AM

## 2019-04-01 NOTE — Patient Instructions (Signed)
Medication Instructions:  Your physician has recommended you make the following change in your medication:  -- STOP fish oil -- START vascepa 2 gram (2 capsules) twice daily = 4 total  If you need a refill on your cardiac medications before your next appointment, please call your pharmacy.   Lab work: FASTING lab work in 3 months (October 2020) to check cholesterol  If you have labs (blood work) drawn today and your tests are completely normal, you will receive your results only by: Marland Kitchen MyChart Message (if you have MyChart) OR . A paper copy in the mail If you have any lab test that is abnormal or we need to change your treatment, we will call you to review the results.   Follow-Up: Dr. Debara Pickett recommends that you schedule a follow up visit with him the in the [place] in 3 months. Please have fasting blood work about 1 week prior to this visit and he will review the blood work results with you at your appointment.

## 2019-04-01 NOTE — Telephone Encounter (Signed)
Call made to patient, spoke with wife (dpr), made aware to come to visit at 1030 instead. Nothing further is needed at this time. Thanks.

## 2019-04-02 ENCOUNTER — Other Ambulatory Visit: Payer: Self-pay

## 2019-04-02 ENCOUNTER — Ambulatory Visit: Payer: Medicare Other | Admitting: Pulmonary Disease

## 2019-04-02 ENCOUNTER — Ambulatory Visit (INDEPENDENT_AMBULATORY_CARE_PROVIDER_SITE_OTHER): Payer: Medicare Other | Admitting: Pulmonary Disease

## 2019-04-02 ENCOUNTER — Encounter: Payer: Self-pay | Admitting: Pulmonary Disease

## 2019-04-02 VITALS — BP 118/80 | HR 64 | Ht 75.5 in | Wt 247.2 lb

## 2019-04-02 DIAGNOSIS — J309 Allergic rhinitis, unspecified: Secondary | ICD-10-CM | POA: Diagnosis not present

## 2019-04-02 DIAGNOSIS — R768 Other specified abnormal immunological findings in serum: Secondary | ICD-10-CM | POA: Diagnosis not present

## 2019-04-02 DIAGNOSIS — I82411 Acute embolism and thrombosis of right femoral vein: Secondary | ICD-10-CM | POA: Diagnosis not present

## 2019-04-02 DIAGNOSIS — E291 Testicular hypofunction: Secondary | ICD-10-CM | POA: Diagnosis not present

## 2019-04-02 DIAGNOSIS — R0602 Shortness of breath: Secondary | ICD-10-CM

## 2019-04-02 DIAGNOSIS — I429 Cardiomyopathy, unspecified: Secondary | ICD-10-CM | POA: Diagnosis not present

## 2019-04-02 DIAGNOSIS — I272 Pulmonary hypertension, unspecified: Secondary | ICD-10-CM | POA: Diagnosis not present

## 2019-04-02 DIAGNOSIS — I2694 Multiple subsegmental pulmonary emboli without acute cor pulmonale: Secondary | ICD-10-CM

## 2019-04-02 DIAGNOSIS — J4520 Mild intermittent asthma, uncomplicated: Secondary | ICD-10-CM | POA: Diagnosis not present

## 2019-04-02 DIAGNOSIS — T7849XA Other allergy, initial encounter: Secondary | ICD-10-CM

## 2019-04-02 MED ORDER — BUDESONIDE-FORMOTEROL FUMARATE 160-4.5 MCG/ACT IN AERO: 2.0000 | INHALATION_SPRAY | Freq: Two times a day (BID) | RESPIRATORY_TRACT | 0 refills | Status: DC

## 2019-04-02 NOTE — Patient Instructions (Addendum)
Thank you for visiting Dr. Valeta Harms at [facility]. Today we recommend the following:  Add Symbicort 160, 2 puffs twice daily with spacer Holding using flovent for the time being   Return in about 8 weeks (around 05/28/2019), or if symptoms worsen or fail to improve. '

## 2019-04-02 NOTE — Progress Notes (Signed)
Synopsis: Referred in September 2019 for shortness of breath by [name], MD  Subjective:   PATIENT ID: [name], [name] GENDER: male DOB: [birth year], MRN: [id number]  Chief Complaint  Patient presents with  . Follow-up    He states he does not feel like his SOB is getting any better. He states he is often out in the yard and has to take frequent rests to catch his breath.     PMH of cardiomyopathy, MI, stent placement, CAD, HLD, follows with Dr. Aundra Dubin. 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 Past 6 months worsening SOB. 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 He goes to the gym every day for an 1 hour. He never wheezes or has chest tightness. He feels like he runs out of breath. He was on allergy shots for a long time. He associates some of this of breath starting around the time he quit allergy shots back in June. Since shortness of breath with exertion.  He is able to go to the gym and walk for nearly 30 minutes without stopping.  He is able to get his heart rate up into the low 100s with exercise.  Patient denies chest pain, nausea vomiting, cough with sputum production. He does have nocturnal dyspnea.  He wakes up in the middle of the night feeling short of breath and will have to sit in the den or in his office upright for a few hours before he feels comfortable enough to lay flat again.  OV 83/21/2019: Positive RAST, positive IgE 215, our lab results returned from last office visit.  Since he was last seen he does feel as if his dyspnea on exertion may be a little bit better and his nocturnal symptoms are little better.  He still getting up in the middle of the night feeling short of breath.  He has not been having any episodes of wheezing.  He does notice that his posterior nasal drip symptoms may be slightly worse in the morning.  He still continually have to clear his sinus passages  and throat in the morning.  Feel as if his hoarseness of voice is slightly worse.  He has not been rinsing out his mouth after using his inhaler.  2 weeks ago at a football game at [facility] he fell down the stairs and landed on his right leg.  Since then the leg has been progressively swollen.  At this point he complains of calf pain with exertion.  The swelling is getting larger.  At this point his right leg is two thirds the size of his left leg.  He does have increased swelling in the legs as compared to his baseline  OV 83/06/2019: Doing well today in the office.  He does feel a little more dyspnea on exertion which is at his baseline.  He is concerned about his low heart rate.  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  He carries to 5 gallon buckets of water to all of his sheds/buildings that he stores his [place] in.  He has to keep up with his game birds.  Otherwise, he did try Breo for couple of days.  Was not really sure it made any difference so he went back to taking his Flovent.  However this was during the time that  he was being treated for his newly diagnosed right lower extremity VTE and presumed PE.  Currently on Eliquis for this.  OV 83/11/2018: Patient recently seen by his allergist Dr. Remus Blake.  They increased his Flovent to 220.  Patient is accompanied today by his wife.  She seems very frustrated that her husband not getting any better.  She also describes that he may or may not be taking his medication as directed at home.  She is also concerned that he is sleeping all the time.  He seems to have somewhat day night reversal.  He is up half of the night sleeps most during the day.  She also states that he has not been active as he was before.  He was seen by his cardiologist having a repeat cardiac catheterization.  There is plans in the works for a cardiopulmonary exercise test but this has been on hold due to COVID restrictions.   Patient continues to complain of the exact same thing which is dyspnea on exertion.  This is been going on now for several months.  But with further history taking and discussion with the wife it appears that he only takes his medication when he "thinks he needs it".   Past Medical History:  Diagnosis Date  . Atrial premature beats   . BPH (benign prostatic hyperplasia)   . BPH (benign prostatic hypertrophy)   . Cataract, right eye   . Chronic low back pain   . Colon polyp   . Coronary artery disease   . DDD (degenerative disc disease), lumbar   . Dermatitis 11/16  . Elevated IgE level 06/30/2018  . Hyperlipidemia   . Insomnia   . Kidney cysts   . Memory loss 2015  . Non-ischemic cardiomyopathy ([place])   . NSTEMI (non-ST elevated myocardial infarction) ([place]) 08/25/2014  . Osteoarthritis of right knee   . Pituitary tumor 2011   macroadenoma w/ VF compromise-then gamma knife a few years leater for some recurrence  . Positive radioallergosorbent test (RAST) 06/30/2018  . Status post insertion of drug-eluting stent into left anterior descending (LAD) artery 08/28/15   + nuc study.   . Urinary retention   . Vitamin D deficiency 01/2008     Family History  Problem Relation Age of Onset  . Congestive Heart Failure Father 32  . Other Mother 38  . Coronary artery disease Brother   . Other Sister 69       polio  . Heart attack Brother        MI/ASCAD 2006  . COPD Sister 60     Past Surgical History:  Procedure Laterality Date  . ARTHROSCOPIC SURGERY    . BACK SURGERY    . CARDIAC CATHETERIZATION  07/27/2015   Dr Aundra Dubin; oLAD 95%, mLAD 50%, CFX stent OK, OM1 60%, OM2 99%, pPLOM 50%, RCA irregular, AM2 70%, EF 55-60%, no regional wall motion abnormalities.     Marland Kitchen CARDIAC CATHETERIZATION N/A 07/28/2015   Procedure: Left Heart Cath and Coronary Angiography;  Surgeon: [name], MD;  Location: [place] [facility];  Service: Cardiovascular;  Laterality: N/A;  . CARDIAC  CATHETERIZATION N/A 07/28/2015   Procedure: Coronary Stent Intervention;  Surgeon: Troy Sine, MD;  Location: [place] [facility];  Service: Cardiovascular;  Laterality: N/A;  . CATARACT EXTRACTION    . CORONARY ANGIOPLASTY WITH STENT PLACEMENT  07/27/2015   XIENCE ALPINE RX 3.0X33 DES to the LAD, 95%>>0  . HAMMER  BUNION TOE SURGERY    .  HAND SURGERY Left   . LEFT HEART CATHETERIZATION WITH CORONARY ANGIOGRAM N/A 08/25/2014   Procedure: LEFT HEART CATHETERIZATION WITH CORONARY ANGIOGRAM;  Surgeon: Burnell Blanks, MD;  Location: [facility];  Service: Cardiovascular;  Laterality: N/A;  . PITUITARY SURGERY     12/01/09 Dr. Wilburn Cornelia and [name].  Marland Kitchen RIGHT/LEFT HEART CATH AND CORONARY ANGIOGRAPHY N/A 03/19/2018   Procedure: RIGHT/LEFT HEART CATH AND CORONARY ANGIOGRAPHY;  Surgeon: [name], MD;  Location: [place] [facility];  Service: Cardiovascular;  Laterality: N/A;  . RIGHT/LEFT HEART CATH AND CORONARY ANGIOGRAPHY N/A 03/13/2019   Procedure: RIGHT/LEFT HEART CATH AND CORONARY ANGIOGRAPHY;  Surgeon: [name], MD;  Location: [place] [facility];  Service: Cardiovascular;  Laterality: N/A;    Social History   Socioeconomic History  . Marital status: Married    Spouse name: [name]  . Number of children: 2  . Years of education: College  . Highest education level: Not on file  Occupational History  . Occupation: retired Programmer, multimedia: RETIRED  Social Needs  . Financial resource strain: Not on file  . Food insecurity    Worry: Not on file    Inability: Not on file  . Transportation needs    Medical: Not on file    Non-medical: Not on file  Tobacco Use  . Smoking status: Never Smoker  . Smokeless tobacco: Never Used  Substance and Sexual Activity  . Alcohol use: Yes    Comment: occas.  . Drug use: No  . Sexual activity: Not on file  Lifestyle  . Physical activity    Days per week: Not on file    Minutes per session: Not on file  . Stress:  Not on file  Relationships  . Social Herbalist on phone: Not on file    Gets together: Not on file    Attends religious service: Not on file    Active member of club or organization: Not on file    Attends meetings of clubs or organizations: Not on file    Relationship status: Not on file  . Intimate partner violence    Fear of current or ex partner: Not on file    Emotionally abused: Not on file    Physically abused: Not on file    Forced sexual activity: Not on file  Other Topics Concern  . Not on file  Social History Narrative   Patient is married [name]).   Patient has two children.   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   Patient is right-handed.   Patient does not drink any caffeine.     Allergies  Allergen Reactions  . Sulfa Antibiotics Anaphylaxis, Nausea And Vomiting and Swelling  . Sulfamethoxazole Anaphylaxis, Nausea And Vomiting and Swelling  . Sulfonamide Derivatives Anaphylaxis, Nausea And Vomiting and Swelling  . Bee Venom Rash  . Flonase [Fluticasone] Other (See Comments)    Caused nose bleeds  . Tamsulosin Other (See Comments)    Unknown reaction- Patient doesn't think he is allergic (??)     Outpatient Medications Prior to Visit  Medication Sig Dispense Refill  . albuterol (VENTOLIN HFA) 108 (90 Base) MCG/ACT inhaler Inhale 2 puffs into the lungs every 6 (six) hours as needed for wheezing or shortness of breath. 3 Inhaler 1  . alfuzosin (UROXATRAL) 10 MG 24 hr tablet Take 1 tablet (10 mg total) by mouth daily with breakfast. (Patient taking differently: Take  10 mg by mouth at bedtime. ) 30 tablet 2  . apixaban (ELIQUIS) 2.5 MG TABS tablet Take 1 tablet (2.5 mg total) by mouth 2 (two) times daily. 180 tablet 3  . aspirin EC 81 MG tablet Take 1 tablet (81 mg total) by mouth daily. 90 tablet 3  . buPROPion (WELLBUTRIN XL) 150 MG 24 hr tablet Take 1 tablet by mouth daily.    . cabergoline (DOSTINEX) 0.5 MG tablet Take 0.5  mg by mouth once a week.    . carvedilol (COREG) 12.5 MG tablet Take 12.5 mg by mouth 2 (two) times daily with a meal.    . Evolocumab (REPATHA) 140 MG/ML SOSY Inject into the skin daily.    Marland Kitchen ezetimibe (ZETIA) 10 MG tablet TAKE 1 TABLET(10 MG) BY MOUTH DAILY 30 tablet 3  . FLOVENT HFA 220 MCG/ACT inhaler Inhale 2 puffs into the lungs 2 (two) times a day.   4  . fluticasone (FLONASE SENSIMIST) 27.5 MCG/SPRAY nasal spray Place 2 sprays into the nose daily.    . furosemide (LASIX) 20 MG tablet Take 1 tablet (20 mg total) by mouth every other day. 45 tablet 3  . hydrocortisone (CORTEF) 20 MG tablet Take 20 mg by mouth 2 (two) times daily.    Vanessa Kick Ethyl 1 g CAPS Take 2 capsules (2 g total) by mouth 2 (two) times a day. 120 capsule 11  . levothyroxine (SYNTHROID, LEVOTHROID) 150 MCG tablet Take 150 mcg by mouth at bedtime.     Marland Kitchen loratadine (CLARITIN) 10 MG tablet Take 10 mg by mouth daily.    . montelukast (SINGULAIR) 10 MG tablet Take 1 tablet (10 mg total) by mouth at bedtime. 30 tablet 11  . MYRBETRIQ 50 MG TB24 tablet Take 50 mg by mouth at bedtime.   0  . nebivolol (BYSTOLIC) 2.5 MG tablet Take 0.5 tablets (1.25 mg total) by mouth daily. 15 tablet 6  . nitroGLYCERIN (NITROSTAT) 0.4 MG SL tablet Place 1 tablet (0.4 mg total) under the tongue every 5 (five) minutes as needed for chest pain. 90 tablet 3  . omeprazole (PRILOSEC) 40 MG capsule Take 40 mg by mouth daily.    . potassium chloride (K-DUR) 10 MEQ tablet Take 2 tablets (20 mEq total) by mouth daily. 180 tablet 3  . ranolazine (RANEXA) 500 MG 12 hr tablet Take 1 tablet (500 mg total) by mouth 2 (two) times daily. 60 tablet 6  . Red Yeast Rice Extract (RED YEAST RICE PO) Take by mouth daily.    Marland Kitchen testosterone cypionate (DEPOTESTOTERONE CYPIONATE) 200 MG/ML injection Inject into the muscle every 21 ( twenty-one) days. 1.62 % injection    . triamcinolone cream (KENALOG) 0.1 % Apply 1 application topically 2 (two) times daily.    Marland Kitchen  zolpidem (AMBIEN) 5 MG tablet Take 1 tablet (5 mg total) by mouth once for 1 dose. Take night of sleep study. 1 tablet 0   Facility-Administered Medications Prior to Visit  Medication Dose Route Frequency Provider Last Rate Last Dose  . sodium chloride flush (NS) 0.9 % injection 3 mL  3 mL Intravenous Q12H [name], MD        Review of Systems  Constitutional: Negative for chills, fever, malaise/fatigue and weight loss.  HENT: Negative for hearing loss, sore throat and tinnitus.   Eyes: Negative for blurred vision and double vision.  Respiratory: Positive for shortness of breath. Negative for cough, hemoptysis, sputum production, wheezing and stridor.   Cardiovascular: Negative for  chest pain, palpitations, orthopnea, leg swelling and PND.  Gastrointestinal: Negative for abdominal pain, constipation, diarrhea, heartburn, nausea and vomiting.  Genitourinary: Negative for dysuria, hematuria and urgency.  Musculoskeletal: Negative for joint pain and myalgias.  Skin: Negative for itching and rash.  Neurological: Negative for dizziness, tingling, weakness and headaches.  Endo/Heme/Allergies: Negative for environmental allergies. Does not bruise/bleed easily.  Psychiatric/Behavioral: Negative for depression. The patient is not nervous/anxious and does not have insomnia.   All other systems reviewed and are negative.   Objective:  Physical Exam Vitals signs reviewed.  Constitutional:      General: He is not in acute distress.    Appearance: He is well-developed.  HENT:     Head: Normocephalic and atraumatic.  Eyes:     General: No scleral icterus.    Conjunctiva/sclera: Conjunctivae normal.     Pupils: Pupils are equal, round, and reactive to light.  Neck:     Musculoskeletal: Neck supple.     Vascular: No JVD.     Trachea: No tracheal deviation.  Cardiovascular:     Rate and Rhythm: Normal rate and regular rhythm.     Heart sounds: Normal heart sounds. No murmur.   Pulmonary:     Effort: Pulmonary effort is normal. No tachypnea, accessory muscle usage or respiratory distress.     Breath sounds: Normal breath sounds. No stridor. No wheezing, rhonchi or rales.  Abdominal:     General: Bowel sounds are normal. There is no distension.     Palpations: Abdomen is soft.     Tenderness: There is no abdominal tenderness.  Musculoskeletal:        General: No tenderness.  Lymphadenopathy:     Cervical: No cervical adenopathy.  Skin:    General: Skin is warm and dry.     Capillary Refill: Capillary refill takes less than 2 seconds.     Findings: No rash.  Neurological:     Mental Status: He is alert and oriented to person, place, and time.  Psychiatric:        Behavior: Behavior normal.     Vitals:   04/02/19 1027  BP: 118/80  Pulse: 64  SpO2: 97%  Weight: 247 lb 3.2 oz (112.1 kg)  Height: 6' 3.5" (1.918 m)   97% on RA BMI Readings from Last 3 Encounters:  04/02/19 30.49 kg/m  04/01/19 28.61 kg/m  03/13/19 28.99 kg/m   Wt Readings from Last 3 Encounters:  04/02/19 247 lb 3.2 oz (112.1 kg)  04/01/19 235 lb (106.6 kg)  03/13/19 235 lb (106.6 kg)     CBC    Component Value Date/Time   WBC 11.6 (H) 03/10/2019 1310   RBC 4.08 (L) 03/10/2019 1310   HGB 11.9 (L) 03/13/2019 1334   HGB 11.6 (L) 03/13/2019 1334   HCT 35.0 (L) 03/13/2019 1334   HCT 34.0 (L) 03/13/2019 1334   PLT 231 03/10/2019 1310   MCV 95.3 03/10/2019 1310   MCH 31.9 03/10/2019 1310   MCHC 33.4 03/10/2019 1310   RDW 14.6 03/10/2019 1310   LYMPHSABS 1.7 08/21/2018 1705   MONOABS 0.7 08/21/2018 1705   EOSABS 0.3 08/21/2018 1705   BASOSABS 0.1 08/21/2018 1705    Regional allergy panel: Positive for multiple items to include cat dander, dog, cockroach, multiple trees. IGE 215  Chest Imaging: CT chest 06/27/2018: No significant parenchymal abnormality. The patient's images have been independently reviewed by me.    Pulmonary Functions Testing  Results: 04/17/2018: Post bronc dilator response is recorded  FVC 4.7 L 98% predicted FEV1 3.68 L 106% predicted Ratio 77 TLC 93% predicted ERV 39% predicted DLCO 51% predicted  FeNO: None   Pathology: None   Echocardiogram: None   Heart Catheterization:  Fick Cardiac Output 6.26 L/min  Fick Cardiac Output Index 2.64 (L/min)/BSA  RA A Wave 12 mmHg  RA V Wave 11 mmHg  RA Mean 8 mmHg  RV Systolic Pressure 29 mmHg  RV Diastolic Pressure 5 mmHg  RV EDP 11 mmHg  PA Systolic Pressure 31 mmHg  PA Diastolic Pressure 17 mmHg  PA Mean 21 mmHg  AO Systolic Pressure 381 mmHg  AO Diastolic Pressure 76 mmHg  AO Mean 829 mmHg  LV Systolic Pressure 937 mmHg  LV Diastolic Pressure 9 mmHg  LV EDP 20 mmHg  AOp Systolic Pressure 169 mmHg  AOp Diastolic Pressure 68 mmHg  AOp Mean Pressure 678 mmHg  LVp Systolic Pressure 938 mmHg  LVp Diastolic Pressure 9 mmHg  LVp EDP Pressure 19 mmHg  QP/QS 1  TPVR Index 7.95 HRUI  TSVR Index 38.98 HRUI  TPVR/TSVR Ratio 0.2   PVR = 2.0 [place]     Assessment & Plan:     ICD-10-CM   1. Mild intermittent extrinsic asthma without complication  B01.75   2. Multiple subsegmental pulmonary emboli without acute cor pulmonale  I26.94   3. Deep vein thrombosis (DVT) of femoral vein of right lower extremity, unspecified chronicity (HCC)  I82.411   4. Positive radioallergosorbent test (RAST)  T78.49XA   5. Elevated IgE level  R76.8   6. SOB (shortness of breath)  R06.02   7. Possible Pulmonary hypertension (HCC)  I27.20   8. Cardiomyopathy, unspecified type ([place])  I42.9   9. Allergic rhinitis, unspecified seasonality, unspecified trigger  J30.9     Discussion:  85 year old gentleman with ongoing issues related to chronic dyspnea.  He has multiple factors that are related to this.  After a prolonged evaluation to include CT imaging of the chest, PFTs with a mildly reduced DLCO, normal spirometry or right heart catheterization with mean elevated PA  pressures, beta-blockade for heart failure and cardiomyopathy as well as atopic features of asthma mild intermittent asthma symptoms, wheeze, positive regional allergy panel as well as an elevated IgE of 257.  Additionally he was diagnosed with bilateral pulmonary emboli and a large right lower extremity VTE and started on Eliquis.  His allergic rhinitis is managed by his allergist.  I think that we should start by clarifying his regimen and he needs to take his medications as directed and not just as needed.  I believe this is a large portion of his symptomatology is related to him self directing some of his medications at home.  And his wife which is present today in the office agrees with this issue at home.  I have started the patient on Symbicort 160, 2 puffs twice daily with a spacer and I have encouraged his wife to ensure that he is using this.  I would hate to start the patient on a biologic medication for the help management of asthma symptoms without being on maximal inhaler support before doing so and having ongoing symptoms through this.  When he was placed on prednisone in the past he did have better breathing and therefore I do believe his symptoms should be controllable if we use medications appropriately.  If the patient has ongoing symptoms despite triple therapy inhaler regimen then we could consider candidacy for a biologic.  Patient to return to  clinic in 8 weeks.  Greater than 50% of this patient's 40-minute office visit was spent discussing and reviewing prior tests, heart catheterization results, medication history as well as counseling the patient on appropriate medication utilization.   Current Outpatient Medications:  .  albuterol (VENTOLIN HFA) 108 (90 Base) MCG/ACT inhaler, Inhale 2 puffs into the lungs every 6 (six) hours as needed for wheezing or shortness of breath., Disp: 3 Inhaler, Rfl: 1 .  alfuzosin (UROXATRAL) 10 MG 24 hr tablet, Take 1 tablet (10 mg total) by  mouth daily with breakfast. (Patient taking differently: Take 10 mg by mouth at bedtime. ), Disp: 30 tablet, Rfl: 2 .  apixaban (ELIQUIS) 2.5 MG TABS tablet, Take 1 tablet (2.5 mg total) by mouth 2 (two) times daily., Disp: 180 tablet, Rfl: 3 .  aspirin EC 81 MG tablet, Take 1 tablet (81 mg total) by mouth daily., Disp: 90 tablet, Rfl: 3 .  buPROPion (WELLBUTRIN XL) 150 MG 24 hr tablet, Take 1 tablet by mouth daily., Disp: , Rfl:  .  cabergoline (DOSTINEX) 0.5 MG tablet, Take 0.5 mg by mouth once a week., Disp: , Rfl:  .  carvedilol (COREG) 12.5 MG tablet, Take 12.5 mg by mouth 2 (two) times daily with a meal., Disp: , Rfl:  .  Evolocumab (REPATHA) 140 MG/ML SOSY, Inject into the skin daily., Disp: , Rfl:  .  ezetimibe (ZETIA) 10 MG tablet, TAKE 1 TABLET(10 MG) BY MOUTH DAILY, Disp: 30 tablet, Rfl: 3 .  FLOVENT HFA 220 MCG/ACT inhaler, Inhale 2 puffs into the lungs 2 (two) times a day. , Disp: , Rfl: 4 .  fluticasone (FLONASE SENSIMIST) 27.5 MCG/SPRAY nasal spray, Place 2 sprays into the nose daily., Disp: , Rfl:  .  furosemide (LASIX) 20 MG tablet, Take 1 tablet (20 mg total) by mouth every other day., Disp: 45 tablet, Rfl: 3 .  hydrocortisone (CORTEF) 20 MG tablet, Take 20 mg by mouth 2 (two) times daily., Disp: , Rfl:  .  Icosapent Ethyl 1 g CAPS, Take 2 capsules (2 g total) by mouth 2 (two) times a day., Disp: 120 capsule, Rfl: 11 .  levothyroxine (SYNTHROID, LEVOTHROID) 150 MCG tablet, Take 150 mcg by mouth at bedtime. , Disp: , Rfl:  .  loratadine (CLARITIN) 10 MG tablet, Take 10 mg by mouth daily., Disp: , Rfl:  .  montelukast (SINGULAIR) 10 MG tablet, Take 1 tablet (10 mg total) by mouth at bedtime., Disp: 30 tablet, Rfl: 11 .  MYRBETRIQ 50 MG TB24 tablet, Take 50 mg by mouth at bedtime. , Disp: , Rfl: 0 .  nebivolol (BYSTOLIC) 2.5 MG tablet, Take 0.5 tablets (1.25 mg total) by mouth daily., Disp: 15 tablet, Rfl: 6 .  nitroGLYCERIN (NITROSTAT) 0.4 MG SL tablet, Place 1 tablet (0.4 mg total)  under the tongue every 5 (five) minutes as needed for chest pain., Disp: 90 tablet, Rfl: 3 .  omeprazole (PRILOSEC) 40 MG capsule, Take 40 mg by mouth daily., Disp: , Rfl:  .  potassium chloride (K-DUR) 10 MEQ tablet, Take 2 tablets (20 mEq total) by mouth daily., Disp: 180 tablet, Rfl: 3 .  ranolazine (RANEXA) 500 MG 12 hr tablet, Take 1 tablet (500 mg total) by mouth 2 (two) times daily., Disp: 60 tablet, Rfl: 6 .  Red Yeast Rice Extract (RED YEAST RICE PO), Take by mouth daily., Disp: , Rfl:  .  testosterone cypionate (DEPOTESTOTERONE CYPIONATE) 200 MG/ML injection, Inject into the muscle every 21 ( twenty-one) days. 1.62 %  injection, Disp: , Rfl:  .  triamcinolone cream (KENALOG) 0.1 %, Apply 1 application topically 2 (two) times daily., Disp: , Rfl:  .  budesonide-formoterol (SYMBICORT) 160-4.5 MCG/ACT inhaler, Inhale 2 puffs into the lungs 2 (two) times a day., Disp: 2 Inhaler, Rfl: 0 .  zolpidem (AMBIEN) 5 MG tablet, Take 1 tablet (5 mg total) by mouth once for 1 dose. Take night of sleep study., Disp: 1 tablet, Rfl: 0 No current facility-administered medications for this visit.   Facility-Administered Medications Ordered in Other Visits:  .  sodium chloride flush (NS) 0.9 % injection 3 mL, 3 mL, Intravenous, Q12H, [name], MD   Garner Nash, DO [facility] 04/02/2019 11:34 AM

## 2019-04-13 ENCOUNTER — Encounter (INDEPENDENT_AMBULATORY_CARE_PROVIDER_SITE_OTHER): Payer: Medicare Other | Admitting: Cardiology

## 2019-04-13 DIAGNOSIS — G479 Sleep disorder, unspecified: Secondary | ICD-10-CM | POA: Diagnosis not present

## 2019-04-13 DIAGNOSIS — R0683 Snoring: Secondary | ICD-10-CM | POA: Diagnosis not present

## 2019-04-15 NOTE — Procedures (Signed)
Sleep Study Report  Patient Information First Name: [name] Last Name: Olsen  ID: [id number] Birth Date: [birth year] Age: 85  Gender: Male  Sleep Study Information Study Date:04/13/2019 Referring Physician:  [name], MD  TEST DESCRIPTION: Home sleep apnea testing was completed using the WatchPat, a Type 1 device, utilizing peripheral arterial tonometry (PAT), chest movement, actigraphy, pulse oximetry, pulse rate, body position and snore. AHI was calculated with apnea and hypopnea using valid sleep time as the denominator. RDI includes apneas, hypopneas, and RERAs. The data acquired and the scoring of sleep and all associated events were performed in accordance with the recommended standards and specifications as outlined in the AASM Manual for the Scoring of Sleep and Associated Events 2.2.0 (2015).  DIAGNOSIS: 1. Normal study with no significant sleep disordered breathing (AHI 2.0/hr). 2. No significant central sleep apnea (pAHIc 0.2/hr. 3. Moderate to loud snoring noted. 4. The patient slept for 4 hours and 38 minutes. 5. There was normal sleep latency and slightly decreased REM latency with 16% REM sleep. 6. The lowest oxygen saturation was 89%.  Recommendations 1. Normal study with no significant sleep disordered breathing. 2. An ENT consultation which may be useful for specific causes of and possible treatment of bothersome snoring . 3. Weight loss may be of benefit in reducing the severity of snoring. 3  Report prepared by: Signature: Fransico Him   Electronically Signed:

## 2019-04-16 ENCOUNTER — Telehealth: Payer: Self-pay | Admitting: *Deleted

## 2019-04-16 NOTE — Telephone Encounter (Signed)
-----   Message from Sueanne Margarita, MD sent at 04/15/2019  7:11 PM EDT ----- Please let patient know that sleep study showed no significant sleep apnea.  He does have significant snoring.  Please have patient followup with Dr. Aundra Dubin to discuss if he wants him to be referred to ENT.

## 2019-04-16 NOTE — Telephone Encounter (Signed)
Informed per dpr  Wife/patient of sleep study results and wife/patient understanding was verbalized. Wife/Patient understands his sleep study showed no significant sleep apnea. He does have significant snoring. Please have patient followup with Dr. Aundra Dubin to discuss if he wants him to be referred to ENT.  Pt /wife is aware and agreeable to normal results and Dr. Theodosia Blender recommendations.

## 2019-04-24 ENCOUNTER — Telehealth: Payer: Self-pay

## 2019-04-24 MED ORDER — BUDESONIDE-FORMOTEROL FUMARATE 160-4.5 MCG/ACT IN AERO: 2.0000 | INHALATION_SPRAY | Freq: Two times a day (BID) | RESPIRATORY_TRACT | 5 refills | Status: DC

## 2019-04-24 MED ORDER — BUDESONIDE-FORMOTEROL FUMARATE 160-4.5 MCG/ACT IN AERO: 2.0000 | INHALATION_SPRAY | Freq: Two times a day (BID) | RESPIRATORY_TRACT | 0 refills | Status: DC

## 2019-04-24 NOTE — Telephone Encounter (Signed)
Pt came by today and I explained to him what [place] told him in the [place]. I sent in Symbicort to his pharmacy and gave him another sample to use. I explained the directions and wrote them down and told him to discontinue Flovent. Pt verbalized understanding and nothing further is needed.

## 2019-04-24 NOTE — Telephone Encounter (Signed)
Call returned to patient wife [name] (dpr), she states they are very confused about his inhalers. She states at his last OV she was told to only use Symbicort but the pharmacy keeps giving him albuterol. I explained that he was to stop using Flovent per the AVS. I explained that he should be using the Symbicort 2 puffs twice daily and Albuterol 2 puffs every 4-6 hours Prn. She was appreciative and states she is now clear/ Nothing further needed at this time.

## 2019-04-24 NOTE — Telephone Encounter (Signed)
Pt wife would like for the nurse to call her at [phone] they need some clarification on his medication please

## 2019-04-24 NOTE — Telephone Encounter (Signed)
Pt came into the office requesting help with his medications. He ran out of Symbicort and I advised him that I vcould send the Rx in to his pharmacy

## 2019-04-28 DIAGNOSIS — E291 Testicular hypofunction: Secondary | ICD-10-CM | POA: Diagnosis not present

## 2019-04-29 ENCOUNTER — Telehealth: Payer: Self-pay

## 2019-04-29 NOTE — Telephone Encounter (Signed)
PA paperwork received for Symbicort 160, PA initiated.  Key: [id number]  Will await determination. Will route message to myself to hold and F/U.

## 2019-04-30 NOTE — Telephone Encounter (Signed)
PA still pending.  

## 2019-05-06 ENCOUNTER — Telehealth: Payer: Self-pay | Admitting: *Deleted

## 2019-05-06 MED ORDER — TORSEMIDE 10 MG PO TABS: 10.0000 mg | ORAL_TABLET | Freq: Every day | ORAL | 3 refills | Status: DC

## 2019-05-06 NOTE — Telephone Encounter (Signed)
Pt aware and agreeable with plan. Lab appt scheduled and medication sent to his pharmacy.

## 2019-05-06 NOTE — Telephone Encounter (Signed)
[facility] office asking for Dr. Aundra Dubin. Says he want to try torsemide for sob. Request that message be sent directly to Dr. Aundra Dubin.

## 2019-05-06 NOTE — Telephone Encounter (Signed)
Based on R[facility] findings (filling pressures not elevated), not sure how helpful the change will be.  However, he could stop his 20 mg daily Lasix and try torsemide 10 mg daily to begin with.  BMET in 10 days.

## 2019-05-07 ENCOUNTER — Telehealth: Payer: Self-pay

## 2019-05-07 NOTE — Telephone Encounter (Signed)
Pharmacy requesting PA for torsemide 10 mg. Plan does not cover medication. Please call [phone] to initiate PA. Patient ID# [id number].

## 2019-05-07 NOTE — Telephone Encounter (Signed)
See below

## 2019-05-13 NOTE — Telephone Encounter (Signed)
Call made to [employer], Symbicort PA denied per rep on 04/29/2019. Breo and Trelegy is covered. Patient has tried and failed Breo and Qvar. Per BI okay to switch to Trelegy.   LMTCB.

## 2019-05-14 DIAGNOSIS — J45909 Unspecified asthma, uncomplicated: Secondary | ICD-10-CM | POA: Diagnosis not present

## 2019-05-14 DIAGNOSIS — E230 Hypopituitarism: Secondary | ICD-10-CM | POA: Diagnosis not present

## 2019-05-14 DIAGNOSIS — G3184 Mild cognitive impairment, so stated: Secondary | ICD-10-CM | POA: Diagnosis not present

## 2019-05-14 DIAGNOSIS — I82401 Acute embolism and thrombosis of unspecified deep veins of right lower extremity: Secondary | ICD-10-CM | POA: Diagnosis not present

## 2019-05-14 DIAGNOSIS — R300 Dysuria: Secondary | ICD-10-CM | POA: Diagnosis not present

## 2019-05-14 DIAGNOSIS — I2510 Atherosclerotic heart disease of native coronary artery without angina pectoris: Secondary | ICD-10-CM | POA: Diagnosis not present

## 2019-05-14 DIAGNOSIS — R0609 Other forms of dyspnea: Secondary | ICD-10-CM | POA: Diagnosis not present

## 2019-05-14 DIAGNOSIS — D649 Anemia, unspecified: Secondary | ICD-10-CM | POA: Diagnosis not present

## 2019-05-14 DIAGNOSIS — N183 Chronic kidney disease, stage 3 (moderate): Secondary | ICD-10-CM | POA: Diagnosis not present

## 2019-05-14 DIAGNOSIS — I2699 Other pulmonary embolism without acute cor pulmonale: Secondary | ICD-10-CM | POA: Diagnosis not present

## 2019-05-14 DIAGNOSIS — R001 Bradycardia, unspecified: Secondary | ICD-10-CM | POA: Diagnosis not present

## 2019-05-18 ENCOUNTER — Inpatient Hospital Stay (HOSPITAL_COMMUNITY): Admission: RE | Admit: 2019-05-18 | Payer: Medicare Other | Source: Ambulatory Visit

## 2019-05-19 ENCOUNTER — Ambulatory Visit (HOSPITAL_COMMUNITY)
Admission: RE | Admit: 2019-05-19 | Discharge: 2019-05-19 | Disposition: A | Discharge status: Home / Self Care | Payer: Medicare Other | Source: Ambulatory Visit | Attending: Internal Medicine | Admitting: Internal Medicine

## 2019-05-19 ENCOUNTER — Other Ambulatory Visit: Payer: Self-pay

## 2019-05-19 DIAGNOSIS — Z23 Encounter for immunization: Secondary | ICD-10-CM | POA: Diagnosis not present

## 2019-05-19 DIAGNOSIS — I429 Cardiomyopathy, unspecified: Secondary | ICD-10-CM | POA: Diagnosis not present

## 2019-05-19 LAB — BASIC METABOLIC PANEL
Anion gap: 10 (ref 5–15)
BUN: 32 mg/dL — ABNORMAL HIGH (ref 8–23)
CO2: 26 mmol/L (ref 22–32)
Calcium: 9.4 mg/dL (ref 8.9–10.3)
Chloride: 105 mmol/L (ref 98–111)
Creatinine, Ser: 1.8 mg/dL — ABNORMAL HIGH (ref 0.61–1.24)
GFR calc Af Amer: 40 mL/min — ABNORMAL LOW (ref >60)
GFR calc non Af Amer: 34 mL/min — ABNORMAL LOW (ref >60)
Glucose, Bld: 68 mg/dL — ABNORMAL LOW (ref 70–99)
Potassium: 3.7 mmol/L (ref 3.5–5.1)
Sodium: 141 mmol/L (ref 135–145)

## 2019-05-19 NOTE — Telephone Encounter (Signed)
LMTCB

## 2019-05-26 DIAGNOSIS — E291 Testicular hypofunction: Secondary | ICD-10-CM | POA: Diagnosis not present

## 2019-05-26 NOTE — Telephone Encounter (Signed)
LMTCB. Pt has upcoming appt. Will address at that time.

## 2019-05-28 ENCOUNTER — Other Ambulatory Visit: Payer: Self-pay

## 2019-06-02 ENCOUNTER — Other Ambulatory Visit (HOSPITAL_COMMUNITY): Payer: Self-pay | Admitting: Cardiology

## 2019-06-03 ENCOUNTER — Telehealth: Payer: Self-pay

## 2019-06-03 ENCOUNTER — Ambulatory Visit (INDEPENDENT_AMBULATORY_CARE_PROVIDER_SITE_OTHER): Payer: Medicare Other | Admitting: Pulmonary Disease

## 2019-06-03 ENCOUNTER — Other Ambulatory Visit: Payer: Self-pay

## 2019-06-03 ENCOUNTER — Encounter: Payer: Self-pay | Admitting: Pulmonary Disease

## 2019-06-03 VITALS — BP 110/82 | HR 80 | Temp 98.1°F | Ht 75.5 in | Wt 242.4 lb

## 2019-06-03 DIAGNOSIS — R768 Other specified abnormal immunological findings in serum: Secondary | ICD-10-CM | POA: Diagnosis not present

## 2019-06-03 DIAGNOSIS — J4540 Moderate persistent asthma, uncomplicated: Secondary | ICD-10-CM | POA: Diagnosis not present

## 2019-06-03 DIAGNOSIS — J4520 Mild intermittent asthma, uncomplicated: Secondary | ICD-10-CM | POA: Diagnosis not present

## 2019-06-03 DIAGNOSIS — T7849XA Other allergy, initial encounter: Secondary | ICD-10-CM

## 2019-06-03 DIAGNOSIS — I272 Pulmonary hypertension, unspecified: Secondary | ICD-10-CM

## 2019-06-03 DIAGNOSIS — I429 Cardiomyopathy, unspecified: Secondary | ICD-10-CM | POA: Diagnosis not present

## 2019-06-03 DIAGNOSIS — I82411 Acute embolism and thrombosis of right femoral vein: Secondary | ICD-10-CM | POA: Diagnosis not present

## 2019-06-03 DIAGNOSIS — R942 Abnormal results of pulmonary function studies: Secondary | ICD-10-CM | POA: Diagnosis not present

## 2019-06-03 MED ORDER — BUDESONIDE 0.5 MG/2ML IN SUSP: 0.5000 mg | Freq: Two times a day (BID) | RESPIRATORY_TRACT | 1 refills | Status: DC

## 2019-06-03 MED ORDER — ARFORMOTEROL TARTRATE 15 MCG/2ML IN NEBU: 15.0000 ug | INHALATION_SOLUTION | Freq: Two times a day (BID) | RESPIRATORY_TRACT | 1 refills | Status: DC

## 2019-06-03 MED ORDER — IPRATROPIUM-ALBUTEROL 0.5-2.5 (3) MG/3ML IN SOLN: 3.0000 mL | RESPIRATORY_TRACT | 1 refills | Status: DC | PRN

## 2019-06-03 MED ORDER — BUDESONIDE-FORMOTEROL FUMARATE 160-4.5 MCG/ACT IN AERO: 2.0000 | INHALATION_SPRAY | Freq: Two times a day (BID) | RESPIRATORY_TRACT | 0 refills | Status: DC

## 2019-06-03 NOTE — Addendum Note (Signed)
Addended by: Vivia Ewing on: 06/03/2019 11:24 AM   Modules accepted: Orders

## 2019-06-03 NOTE — Progress Notes (Signed)
Synopsis: Referred in Sept 2019 for DOE by [name], MD  Subjective:   PATIENT ID: [name], [name] GENDER: male DOB: 15-Jan-1934, MRN: [id number]  Chief Complaint  Patient presents with   Follow-up    Insurance will not cover symbicort. Will cover advair or dulera. He reports he feels like his breathing has gotten better. Last dose of symbicort was taken yesterday.     PMH of cardiomyopathy, MI, stent placement, CAD, HLD, follows with Dr. Aundra Dubin. 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 Past 6 months worsening SOB. 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 He goes to the gym every day for an 1 hour. He never wheezes or has chest tightness. He feels like he runs out of breath. He was on allergy shots for a long time. He associates some of this of breath starting around the time he quit allergy shots back in June. Since shortness of breath with exertion.  He is able to go to the gym and walk for nearly 30 minutes without stopping.  He is able to get his heart rate up into the low 100s with exercise.  Patient denies chest pain, nausea vomiting, cough with sputum production. He does have nocturnal dyspnea.  He wakes up in the middle of the night feeling short of breath and will have to sit in the den or in his office upright for a few hours before he feels comfortable enough to lay flat again.  OV 08/21/2018: Positive RAST, positive IgE 215, our lab results returned from last office visit.  Since he was last seen he does feel as if his dyspnea on exertion may be a little bit better and his nocturnal symptoms are little better.  He still getting up in the middle of the night feeling short of breath.  He has not been having any episodes of wheezing.  He does notice that his posterior nasal drip symptoms may be slightly worse in the morning.  He still continually have to clear his sinus passages and  throat in the morning.  Feel as if his hoarseness of voice is slightly worse.  He has not been rinsing out his mouth after using his inhaler.  2 weeks ago at a football game at [facility] he fell down the stairs and landed on his right leg.  Since then the leg has been progressively swollen.  At this point he complains of calf pain with exertion.  The swelling is getting larger.  At this point his right leg is two thirds the size of his left leg.  He does have increased swelling in the legs as compared to his baseline  OV 10/09/2018: Doing well today in the office.  He does feel a little more dyspnea on exertion which is at his baseline.  He is concerned about his low heart rate.  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  He carries to 5 gallon buckets of water to all of his sheds/buildings that he stores his [place] in.  He has to keep up with his game birds.  Otherwise, he did try Breo for couple of days.  Was not really sure it made any difference so he went back to taking his Flovent.  However this was during the time that he was being treated for his  newly diagnosed right lower extremity VTE and presumed PE.  Currently on Eliquis for this.  OV 04/02/2019: Patient recently seen by his allergist Dr. Remus Blake.  They increased his Flovent to 220.  Patient is accompanied today by his wife.  She seems very frustrated that her husband not getting any better.  She also describes that he may or may not be taking his medication as directed at home.  She is also concerned that he is sleeping all the time.  He seems to have somewhat day night reversal.  He is up half of the night sleeps most during the day.  She also states that he has not been active as he was before.  He was seen by his cardiologist having a repeat cardiac catheterization.  There is plans in the works for a cardiopulmonary exercise test but this has been on hold due to COVID restrictions.  Patient  continues to complain of the exact same thing which is dyspnea on exertion.  This is been going on now for several months.  But with further history taking and discussion with the wife it appears that he only takes his medication when he "thinks he needs it".  OV 06/03/2019: Patient seen today in follow-up regarding asthma symptoms.  Wife concerned that patient is still not taking medications correctly.  Also recently saw primary care [name].  I agree with his discussion with him that may be switching to nebulized medications may be simpler.  Additionally we could start approval process for biologic.  I would prefer that he actually be on a regular inhaled steroid.  He has been using these for some time but has not seen any significant change.  As for his insomnia and day's sleep reversal I think that this is going to have to be addressed.  Discussing these behavior issues at home seems to be significant amount of distress for the patient's wife.  She also would like me to call and speak with [name] if possible.   Past Medical History:  Diagnosis Date   Atrial premature beats    BPH (benign prostatic hyperplasia)    BPH (benign prostatic hypertrophy)    Cataract, right eye    Chronic low back pain    Colon polyp    Coronary artery disease    DDD (degenerative disc disease), lumbar    Dermatitis 11/16   Elevated IgE level 06/30/2018   Hyperlipidemia    Insomnia    Kidney cysts    Memory loss 2015   Non-ischemic cardiomyopathy ([place])    NSTEMI (non-ST elevated myocardial infarction) ([place]) 08/25/2014   Osteoarthritis of right knee    Pituitary tumor 2011   macroadenoma w/ VF compromise-then gamma knife a few years leater for some recurrence   Positive radioallergosorbent test (RAST) 06/30/2018   Status post insertion of drug-eluting stent into left anterior descending (LAD) artery 08/28/15   + nuc study.    Urinary retention    Vitamin D deficiency 01/2008      Family History  Problem Relation Age of Onset   Congestive Heart Failure Father 32   Other Mother 44   Coronary artery disease Brother    Other Sister 56       polio   Heart attack Brother        MI/ASCAD 2006   COPD Sister 52     Past Surgical History:  Procedure Laterality Date   ARTHROSCOPIC SURGERY     BACK SURGERY  CARDIAC CATHETERIZATION  07/27/2015   Dr Aundra Dubin; oLAD 95%, mLAD 50%, CFX stent OK, OM1 60%, OM2 99%, pPLOM 50%, RCA irregular, AM2 70%, EF 55-60%, no regional wall motion abnormalities.      CARDIAC CATHETERIZATION N/A 07/28/2015   Procedure: Left Heart Cath and Coronary Angiography;  Surgeon: [name], MD;  Location: [facility];  Service: Cardiovascular;  Laterality: N/A;   CARDIAC CATHETERIZATION N/A 07/28/2015   Procedure: Coronary Stent Intervention;  Surgeon: Troy Sine, MD;  Location: [facility];  Service: Cardiovascular;  Laterality: N/A;   CATARACT EXTRACTION     CORONARY ANGIOPLASTY WITH STENT PLACEMENT  07/27/2015   XIENCE ALPINE RX 3.0X33 DES to the LAD, 95%>>0   HAMMER  BUNION TOE SURGERY     HAND SURGERY Left    LEFT HEART CATHETERIZATION WITH CORONARY ANGIOGRAM N/A 08/25/2014   Procedure: LEFT HEART CATHETERIZATION WITH CORONARY ANGIOGRAM;  Surgeon: Burnell Blanks, MD;  Location: [facility];  Service: Cardiovascular;  Laterality: N/A;   PITUITARY SURGERY     12/01/09 Dr. Wilburn Cornelia and [name].   RIGHT/LEFT HEART CATH AND CORONARY ANGIOGRAPHY N/A 03/19/2018   Procedure: RIGHT/LEFT HEART CATH AND CORONARY ANGIOGRAPHY;  Surgeon: [name], MD;  Location: [facility];  Service: Cardiovascular;  Laterality: N/A;   RIGHT/LEFT HEART CATH AND CORONARY ANGIOGRAPHY N/A 03/13/2019   Procedure: RIGHT/LEFT HEART CATH AND CORONARY ANGIOGRAPHY;  Surgeon: [name], MD;  Location: [facility];  Service: Cardiovascular;  Laterality: N/A;    Social History   Socioeconomic  History   Marital status: Married    Spouse name: [name]   Number of children: 2   Years of education: College   Highest education level: Not on file  Occupational History   Occupation: retired Programmer, multimedia: RETIRED  Social Designer, fashion/clothing strain: Not on file   Food insecurity    Worry: Not on file    Inability: Not on file   Transportation needs    Medical: Not on file    Non-medical: Not on file  Tobacco Use   Smoking status: Never Smoker   Smokeless tobacco: Never Used  Substance and Sexual Activity   Alcohol use: Yes    Comment: occas.   Drug use: No   Sexual activity: Not on file  Lifestyle   Physical activity    Days per week: Not on file    Minutes per session: Not on file   Stress: Not on file  Relationships   Social connections    Talks on phone: Not on file    Gets together: Not on file    Attends religious service: Not on file    Active member of club or organization: Not on file    Attends meetings of clubs or organizations: Not on file    Relationship status: Not on file   Intimate partner violence    Fear of current or ex partner: Not on file    Emotionally abused: Not on file    Physically abused: Not on file    Forced sexual activity: Not on file  Other Topics Concern   Not on file  Social History Narrative   Patient is married [name]).   Patient has two children.   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   Patient is right-handed.   Patient does not drink any caffeine.     Allergies  Allergen  Reactions   Sulfa Antibiotics Anaphylaxis, Nausea And Vomiting and Swelling   Sulfamethoxazole Anaphylaxis, Nausea And Vomiting and Swelling   Sulfonamide Derivatives Anaphylaxis, Nausea And Vomiting and Swelling   Bee Venom Rash   Flonase [Fluticasone] Other (See Comments)    Caused nose bleeds   Tamsulosin Other (See Comments)    Unknown reaction- Patient doesn't think he is  allergic (??)     Outpatient Medications Prior to Visit  Medication Sig Dispense Refill   albuterol (VENTOLIN HFA) 108 (90 Base) MCG/ACT inhaler Inhale 2 puffs into the lungs every 6 (six) hours as needed for wheezing or shortness of breath. 3 Inhaler 1   apixaban (ELIQUIS) 2.5 MG TABS tablet Take 1 tablet (2.5 mg total) by mouth 2 (two) times daily. 180 tablet 3   aspirin EC 81 MG tablet Take 1 tablet (81 mg total) by mouth daily. 90 tablet 3   budesonide-formoterol (SYMBICORT) 160-4.5 MCG/ACT inhaler Inhale 2 puffs into the lungs 2 (two) times a day. 1 Inhaler 5   buPROPion (WELLBUTRIN XL) 150 MG 24 hr tablet Take 1 tablet by mouth daily.     BYSTOLIC 2.5 MG tablet TAKE 1/2 TABLET(1.25 MG) BY MOUTH DAILY 15 tablet 6   cabergoline (DOSTINEX) 0.5 MG tablet Take 0.5 mg by mouth once a week.     carvedilol (COREG) 12.5 MG tablet Take 12.5 mg by mouth 2 (two) times daily with a meal.     Evolocumab (REPATHA) 140 MG/ML SOSY Inject into the skin daily.     ezetimibe (ZETIA) 10 MG tablet TAKE 1 TABLET(10 MG) BY MOUTH DAILY 30 tablet 3   fluticasone (FLONASE SENSIMIST) 27.5 MCG/SPRAY nasal spray Place 2 sprays into the nose daily.     hydrocortisone (CORTEF) 20 MG tablet Take 20 mg by mouth 2 (two) times daily.     Icosapent Ethyl 1 g CAPS Take 2 capsules (2 g total) by mouth 2 (two) times a day. 120 capsule 11   levothyroxine (SYNTHROID, LEVOTHROID) 150 MCG tablet Take 150 mcg by mouth at bedtime.      loratadine (CLARITIN) 10 MG tablet Take 10 mg by mouth daily.     montelukast (SINGULAIR) 10 MG tablet Take 1 tablet (10 mg total) by mouth at bedtime. 30 tablet 11   MYRBETRIQ 50 MG TB24 tablet Take 50 mg by mouth at bedtime.   0   nitroGLYCERIN (NITROSTAT) 0.4 MG SL tablet Place 1 tablet (0.4 mg total) under the tongue every 5 (five) minutes as needed for chest pain. 90 tablet 3   omeprazole (PRILOSEC) 40 MG capsule Take 40 mg by mouth daily.     potassium chloride (K-DUR) 10  MEQ tablet Take 2 tablets (20 mEq total) by mouth daily. 180 tablet 3   ranolazine (RANEXA) 500 MG 12 hr tablet Take 1 tablet (500 mg total) by mouth 2 (two) times daily. 60 tablet 6   Red Yeast Rice Extract (RED YEAST RICE PO) Take by mouth daily.     testosterone cypionate (DEPOTESTOTERONE CYPIONATE) 200 MG/ML injection Inject into the muscle every 21 ( twenty-one) days. 1.62 % injection     torsemide (DEMADEX) 10 MG tablet Take 1 tablet (10 mg total) by mouth daily. 30 tablet 3   triamcinolone cream (KENALOG) 0.1 % Apply 1 application topically 2 (two) times daily.     alfuzosin (UROXATRAL) 10 MG 24 hr tablet Take 1 tablet (10 mg total) by mouth daily with breakfast. (Patient not taking: Reported on 06/03/2019) 30 tablet 2  FLOVENT HFA 220 MCG/ACT inhaler Inhale 2 puffs into the lungs 2 (two) times a day.   4   zolpidem (AMBIEN) 5 MG tablet Take 1 tablet (5 mg total) by mouth once for 1 dose. Take night of sleep study. 1 tablet 0   Facility-Administered Medications Prior to Visit  Medication Dose Route Frequency Provider Last Rate Last Dose   sodium chloride flush (NS) 0.9 % injection 3 mL  3 mL Intravenous Q12H [name], MD        Review of Systems  Constitutional: Negative for chills, fever, malaise/fatigue and weight loss.  HENT: Negative for hearing loss, sore throat and tinnitus.   Eyes: Negative for blurred vision and double vision.  Respiratory: Positive for cough, shortness of breath and wheezing. Negative for hemoptysis, sputum production and stridor.   Cardiovascular: Negative for chest pain, palpitations, orthopnea, leg swelling and PND.  Gastrointestinal: Negative for abdominal pain, constipation, diarrhea, heartburn, nausea and vomiting.  Genitourinary: Negative for dysuria, hematuria and urgency.  Musculoskeletal: Negative for joint pain and myalgias.  Skin: Negative for itching and rash.  Neurological: Negative for dizziness, tingling, weakness and  headaches.  Endo/Heme/Allergies: Negative for environmental allergies. Does not bruise/bleed easily.  Psychiatric/Behavioral: Negative for depression. The patient is not nervous/anxious and does not have insomnia.   All other systems reviewed and are negative.    Objective:  Physical Exam Vitals signs reviewed.  Constitutional:      General: He is not in acute distress.    Appearance: He is well-developed.  HENT:     Head: Normocephalic and atraumatic.  Eyes:     General: No scleral icterus.    Conjunctiva/sclera: Conjunctivae normal.     Pupils: Pupils are equal, round, and reactive to light.  Neck:     Musculoskeletal: Neck supple.     Vascular: No JVD.     Trachea: No tracheal deviation.  Cardiovascular:     Rate and Rhythm: Normal rate and regular rhythm.     Heart sounds: Normal heart sounds. No murmur.  Pulmonary:     Effort: Pulmonary effort is normal. No tachypnea, accessory muscle usage or respiratory distress.     Breath sounds: No stridor. No wheezing, rhonchi or rales.     Comments: Diminished BL  Abdominal:     General: Bowel sounds are normal. There is no distension.     Palpations: Abdomen is soft.     Tenderness: There is no abdominal tenderness.  Musculoskeletal:        General: No tenderness.     Right lower leg: Edema present.     Left lower leg: Edema present.  Lymphadenopathy:     Cervical: No cervical adenopathy.  Skin:    General: Skin is warm and dry.     Capillary Refill: Capillary refill takes less than 2 seconds.     Findings: No rash.  Neurological:     Mental Status: He is alert and oriented to person, place, and time.  Psychiatric:        Behavior: Behavior normal.      Vitals:   06/03/19 1032  BP: 110/82  Pulse: 80  Temp: 98.1 F (36.7 C)  TempSrc: Temporal  SpO2: 98%  Weight: 242 lb 6.4 oz (110 kg)  Height: 6' 3.5" (1.918 m)   98% on RA BMI Readings from Last 3 Encounters:  06/03/19 29.90 kg/m  04/02/19 30.49 kg/m   04/01/19 28.61 kg/m   Wt Readings from Last 3 Encounters:  06/03/19  242 lb 6.4 oz (110 kg)  04/02/19 247 lb 3.2 oz (112.1 kg)  04/01/19 235 lb (106.6 kg)     CBC    Component Value Date/Time   WBC 11.6 (H) 03/10/2019 1310   RBC 4.08 (L) 03/10/2019 1310   HGB 11.9 (L) 03/13/2019 1334   HGB 11.6 (L) 03/13/2019 1334   HCT 35.0 (L) 03/13/2019 1334   HCT 34.0 (L) 03/13/2019 1334   PLT 231 03/10/2019 1310   MCV 95.3 03/10/2019 1310   MCH 31.9 03/10/2019 1310   MCHC 33.4 03/10/2019 1310   RDW 14.6 03/10/2019 1310   LYMPHSABS 1.7 08/21/2018 1705   MONOABS 0.7 08/21/2018 1705   EOSABS 0.3 08/21/2018 1705   BASOSABS 0.1 08/21/2018 1705    Pulmonary Functions Testing Results: PFT Results Latest Ref Rng & Units 04/17/2018  FVC-Pre L 4.51  FVC-Predicted Pre % 93  FVC-Post L 4.76  FVC-Predicted Post % 98  Pre FEV1/FVC % % 78  Post FEV1/FCV % % 77  FEV1-Pre L 3.51  FEV1-Predicted Pre % 101  FEV1-Post L 3.68  DLCO UNC% % 51  DLCO COR %Predicted % 57  TLC L 7.54  TLC % Predicted % 93  RV % Predicted % 100   Regional allergy panel: Positive for multiple items to include cat dander, dog, cockroach, multiple trees. IGE 215  Chest Imaging: CT chest 06/27/2018: No significant parenchymal abnormality. The patient's images have been independently reviewed by me.    FeNO: None   Pathology: None   Echocardiogram: None   Heart Catheterization:  Fick Cardiac Output 6.26 L/min  Fick Cardiac Output Index 2.64 (L/min)/BSA  RA A Wave 12 mmHg  RA V Wave 11 mmHg  RA Mean 8 mmHg  RV Systolic Pressure 29 mmHg  RV Diastolic Pressure 5 mmHg  RV EDP 11 mmHg  PA Systolic Pressure 31 mmHg  PA Diastolic Pressure 17 mmHg  PA Mean 21 mmHg  AO Systolic Pressure [id number] mmHg  AO Diastolic Pressure 76 mmHg  AO Mean [id number] mmHg  LV Systolic Pressure Q000111Q mmHg  LV Diastolic Pressure 9 mmHg  LV EDP 20 mmHg  AOp Systolic Pressure 123456 mmHg  AOp Diastolic Pressure 68 mmHg  AOp Mean Pressure 100  mmHg  LVp Systolic Pressure Q000111Q mmHg  LVp Diastolic Pressure 9 mmHg  LVp EDP Pressure 19 mmHg  QP/QS 1  TPVR Index 7.95 HRUI  TSVR Index 38.98 HRUI  TPVR/TSVR Ratio 0.2   PVR = 2.0 [place]    Assessment & Plan:     ICD-10-CM   1. Moderate persistent asthma without complication  123456   3. Deep vein thrombosis (DVT) of femoral vein of right lower extremity, unspecified chronicity (HCC)  I82.411   4. Positive radioallergosorbent test (RAST)  T78.49XA   5. Elevated IgE level  R76.8   6. Possible Pulmonary hypertension (HCC)  I27.20   7. Cardiomyopathy, unspecified type ([place])  I42.9   8. Decreased diffusion capacity  R94.2     Discussion:  85 year old gentleman with ongoing right issues related to his chronic dyspnea.  This is multifactorial in etiology.  As for management of his asthma symptoms this has been very difficult with inhalers and use at home.  We will switch all of these to nebulized therapies.  As for his elevated IgE and positive Rast panel I do believe that he be a candidate for biologic therapy we have discussed this in the past.  With ongoing issues related to inhalers and minimal improvement in symptoms  we will start paperwork for approval of [place].  As stated previously he has had a significant evaluation to include CT imaging the chest, PFTs mildly reduced DLCO prior right heart catheterization with mean elevated PA pressures, beta-blockade for heart failure cardiomyopathy, atopic etiology related to persistent asthma symptoms.  At this point still having significant nocturnal symptomatology.  Bilateral pulmonary emboli and right lower extremity VTE on Eliquis.    We will switch patient's medication regimen from inhalers to nebulized solutions. Start Pulmicort plus [employer] approval process for Dupixent for moderate persistent asthma, failed Breo, failed Flovent, failed Symbicort. We may have to switch biologic agent as pending insurance approval's.  Greater  than 50% of this patient's 45-minute office was spent face-to-face discussing above recommendations and treatment plan.   Current Outpatient Medications:    albuterol (VENTOLIN HFA) 108 (90 Base) MCG/ACT inhaler, Inhale 2 puffs into the lungs every 6 (six) hours as needed for wheezing or shortness of breath., Disp: 3 Inhaler, Rfl: 1   apixaban (ELIQUIS) 2.5 MG TABS tablet, Take 1 tablet (2.5 mg total) by mouth 2 (two) times daily., Disp: 180 tablet, Rfl: 3   aspirin EC 81 MG tablet, Take 1 tablet (81 mg total) by mouth daily., Disp: 90 tablet, Rfl: 3   budesonide-formoterol (SYMBICORT) 160-4.5 MCG/ACT inhaler, Inhale 2 puffs into the lungs 2 (two) times a day., Disp: 1 Inhaler, Rfl: 5   buPROPion (WELLBUTRIN XL) 150 MG 24 hr tablet, Take 1 tablet by mouth daily., Disp: , Rfl:    BYSTOLIC 2.5 MG tablet, TAKE 1/2 TABLET(1.25 MG) BY MOUTH DAILY, Disp: 15 tablet, Rfl: 6   cabergoline (DOSTINEX) 0.5 MG tablet, Take 0.5 mg by mouth once a week., Disp: , Rfl:    carvedilol (COREG) 12.5 MG tablet, Take 12.5 mg by mouth 2 (two) times daily with a meal., Disp: , Rfl:    Evolocumab (REPATHA) 140 MG/ML SOSY, Inject into the skin daily., Disp: , Rfl:    ezetimibe (ZETIA) 10 MG tablet, TAKE 1 TABLET(10 MG) BY MOUTH DAILY, Disp: 30 tablet, Rfl: 3   fluticasone (FLONASE SENSIMIST) 27.5 MCG/SPRAY nasal spray, Place 2 sprays into the nose daily., Disp: , Rfl:    hydrocortisone (CORTEF) 20 MG tablet, Take 20 mg by mouth 2 (two) times daily., Disp: , Rfl:    Icosapent Ethyl 1 g CAPS, Take 2 capsules (2 g total) by mouth 2 (two) times a day., Disp: 120 capsule, Rfl: 11   levothyroxine (SYNTHROID, LEVOTHROID) 150 MCG tablet, Take 150 mcg by mouth at bedtime. , Disp: , Rfl:    loratadine (CLARITIN) 10 MG tablet, Take 10 mg by mouth daily., Disp: , Rfl:    montelukast (SINGULAIR) 10 MG tablet, Take 1 tablet (10 mg total) by mouth at bedtime., Disp: 30 tablet, Rfl: 11   MYRBETRIQ 50 MG TB24 tablet, Take  50 mg by mouth at bedtime. , Disp: , Rfl: 0   nitroGLYCERIN (NITROSTAT) 0.4 MG SL tablet, Place 1 tablet (0.4 mg total) under the tongue every 5 (five) minutes as needed for chest pain., Disp: 90 tablet, Rfl: 3   omeprazole (PRILOSEC) 40 MG capsule, Take 40 mg by mouth daily., Disp: , Rfl:    potassium chloride (K-DUR) 10 MEQ tablet, Take 2 tablets (20 mEq total) by mouth daily., Disp: 180 tablet, Rfl: 3   ranolazine (RANEXA) 500 MG 12 hr tablet, Take 1 tablet (500 mg total) by mouth 2 (two) times daily., Disp: 60 tablet, Rfl: 6   Red Yeast Rice  Extract (RED YEAST RICE PO), Take by mouth daily., Disp: , Rfl:    testosterone cypionate (DEPOTESTOTERONE CYPIONATE) 200 MG/ML injection, Inject into the muscle every 21 ( twenty-one) days. 1.62 % injection, Disp: , Rfl:    torsemide (DEMADEX) 10 MG tablet, Take 1 tablet (10 mg total) by mouth daily., Disp: 30 tablet, Rfl: 3   triamcinolone cream (KENALOG) 0.1 %, Apply 1 application topically 2 (two) times daily., Disp: , Rfl:  No current facility-administered medications for this visit.   Facility-Administered Medications Ordered in Other Visits:    sodium chloride flush (NS) 0.9 % injection 3 mL, 3 mL, Intravenous, Q12H, [name], MD   Garner Nash, DO [facility] 06/03/2019 10:46 AM

## 2019-06-03 NOTE — Patient Instructions (Addendum)
Thank you for visiting Dr. Valeta Harms at [facility]. Today we recommend the following: Orders Placed This Encounter  Procedures  . Ambulatory Referral for DME   Meds ordered this encounter  Medications  . ipratropium-albuterol (DUONEB) 0.5-2.5 (3) MG/3ML SOLN    Sig: Take 3 mLs by nebulization every 4 (four) hours as needed (Take 34ml every 4 hours and prn for wheezing and shortness of breath).    Dispense:  360 mL    Refill:  1    DX J45.909  . arformoterol (BROVANA) 15 MCG/2ML NEBU    Sig: Take 2 mLs (15 mcg total) by nebulization 2 (two) times daily.    Dispense:  120 mL    Refill:  1    J45.909  . budesonide (PULMICORT) 0.5 MG/2ML nebulizer solution    Sig: Take 2 mLs (0.5 mg total) by nebulization 2 (two) times daily.    Dispense:  120 mL    Refill:  1    Dx J45.909   Return in about 8 weeks (around 07/29/2019).    Please do your part to reduce the spread of COVID-19.

## 2019-06-03 NOTE — Telephone Encounter (Signed)
LMTCB. 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 We need to know how many is in his household and the annual income.

## 2019-06-04 NOTE — Telephone Encounter (Signed)
Noted. Dupixent form has been updated. Will give to injection team to coordinate. Nothing further is needed at this time. Will make BI aware.

## 2019-06-04 NOTE — Telephone Encounter (Signed)
Pt's wife called and she states the household size is 2  Annual income Before taxes 130,000  After taxes: 120,000  She states she doesn't think that he doesn't want to use the nebulizer and they are going to try the Symbicort instead since they have a month worth of samples. She also mentioned that the insurance doesn't cover symbicort so it may need to be changed later. [place]

## 2019-06-04 NOTE — Telephone Encounter (Signed)
Pt's wife is returning call. Cb is [phone].

## 2019-06-05 ENCOUNTER — Telehealth: Payer: Self-pay | Admitting: Pulmonary Disease

## 2019-06-05 NOTE — Telephone Encounter (Signed)
[employer] forms received from [place], [place].  Forms completed and signed by Dr Valeta Harms and Patient.  Forms faxed to [place] [employer].

## 2019-06-10 NOTE — Telephone Encounter (Signed)
Summary of benefits received. [employer] needs a PA, PA form was faxed to our office. PA form has been filled out, signed and faxed to [facility]. Specialty Pharmacy will be [facility] per summary of benefits. Will await PA decision.

## 2019-06-17 DIAGNOSIS — R260 Ataxic gait: Secondary | ICD-10-CM | POA: Diagnosis not present

## 2019-06-17 DIAGNOSIS — R531 Weakness: Secondary | ICD-10-CM | POA: Diagnosis not present

## 2019-06-17 DIAGNOSIS — R296 Repeated falls: Secondary | ICD-10-CM | POA: Diagnosis not present

## 2019-06-17 DIAGNOSIS — M6281 Muscle weakness (generalized): Secondary | ICD-10-CM | POA: Diagnosis not present

## 2019-06-18 NOTE — Telephone Encounter (Signed)
We have not reicevd anything on the pt's PA status. I attempted to call [employer]. I was placed on a 20+ min hold with no one coming to the line. Will continue to follow up.

## 2019-06-23 DIAGNOSIS — R260 Ataxic gait: Secondary | ICD-10-CM | POA: Diagnosis not present

## 2019-06-23 DIAGNOSIS — M6281 Muscle weakness (generalized): Secondary | ICD-10-CM | POA: Diagnosis not present

## 2019-06-23 DIAGNOSIS — R531 Weakness: Secondary | ICD-10-CM | POA: Diagnosis not present

## 2019-06-23 DIAGNOSIS — R296 Repeated falls: Secondary | ICD-10-CM | POA: Diagnosis not present

## 2019-06-23 DIAGNOSIS — E291 Testicular hypofunction: Secondary | ICD-10-CM | POA: Diagnosis not present

## 2019-06-24 ENCOUNTER — Telehealth: Payer: Self-pay | Admitting: Pulmonary Disease

## 2019-06-24 NOTE — Telephone Encounter (Signed)
LMOMTCB x 1 

## 2019-06-25 DIAGNOSIS — R531 Weakness: Secondary | ICD-10-CM | POA: Diagnosis not present

## 2019-06-25 DIAGNOSIS — R296 Repeated falls: Secondary | ICD-10-CM | POA: Diagnosis not present

## 2019-06-25 DIAGNOSIS — R260 Ataxic gait: Secondary | ICD-10-CM | POA: Diagnosis not present

## 2019-06-25 DIAGNOSIS — M6281 Muscle weakness (generalized): Secondary | ICD-10-CM | POA: Diagnosis not present

## 2019-06-25 NOTE — Telephone Encounter (Signed)
LMTCB x2 for pt 

## 2019-06-26 ENCOUNTER — Telehealth: Payer: Self-pay | Admitting: Pulmonary Disease

## 2019-06-26 MED ORDER — BUDESONIDE-FORMOTEROL FUMARATE 160-4.5 MCG/ACT IN AERO: 2.0000 | INHALATION_SPRAY | Freq: Two times a day (BID) | RESPIRATORY_TRACT | 0 refills | Status: DC

## 2019-06-26 NOTE — Telephone Encounter (Signed)
LMTCB x3 for pt. We have attempted to contact pt several times with no success or call back from pt. Per triage protocol, message will be closed.   

## 2019-06-26 NOTE — Telephone Encounter (Signed)
Call returned to patient wife [name] (dpr), she is inquiring if we have symbicort 160 samples. I made her aware I would get this message addressed by [facility] staff. Voiced understanding.

## 2019-06-26 NOTE — Telephone Encounter (Signed)
Called and spoke with Patient's wife, [name].  [name] stated Patient needed Symbicort 160 samples, if possible. [name] stated she would contact insurance next week about preferred inhalers. Symbicort samples placed at front for pick up.  Nothing further at this time.

## 2019-06-29 DIAGNOSIS — R531 Weakness: Secondary | ICD-10-CM | POA: Diagnosis not present

## 2019-06-29 DIAGNOSIS — R260 Ataxic gait: Secondary | ICD-10-CM | POA: Diagnosis not present

## 2019-06-29 DIAGNOSIS — R296 Repeated falls: Secondary | ICD-10-CM | POA: Diagnosis not present

## 2019-06-29 DIAGNOSIS — M6281 Muscle weakness (generalized): Secondary | ICD-10-CM | POA: Diagnosis not present

## 2019-06-29 NOTE — Telephone Encounter (Signed)
Contacted [employer] to check the status of this PA. Was advised that they did not receive the form that was faxed back to them. PA was started over the phone. Will receive a PA determination in 24-72 hours.

## 2019-07-02 DIAGNOSIS — M6281 Muscle weakness (generalized): Secondary | ICD-10-CM | POA: Diagnosis not present

## 2019-07-02 DIAGNOSIS — R260 Ataxic gait: Secondary | ICD-10-CM | POA: Diagnosis not present

## 2019-07-02 DIAGNOSIS — R531 Weakness: Secondary | ICD-10-CM | POA: Diagnosis not present

## 2019-07-02 DIAGNOSIS — R296 Repeated falls: Secondary | ICD-10-CM | POA: Diagnosis not present

## 2019-07-03 MED ORDER — DUPIXENT 200 MG/1.14ML ~~LOC~~ SOSY: 200.0000 mg | PREFILLED_SYRINGE | SUBCUTANEOUS | 11 refills | Status: DC

## 2019-07-03 MED ORDER — DUPIXENT 200 MG/1.14ML ~~LOC~~ SOSY: 400.0000 mg | PREFILLED_SYRINGE | Freq: Once | SUBCUTANEOUS | 0 refills | Status: AC

## 2019-07-03 NOTE — Telephone Encounter (Signed)
Fax dated 06/29/2019, was brought to me by [place]. [employer] PA has been approved through 06/28/2020. Rxs for the loading dose and maintenance dose have been sent [facility]. Will follow up with [facility] next week to set up shipment.

## 2019-07-06 DIAGNOSIS — R7301 Impaired fasting glucose: Secondary | ICD-10-CM | POA: Diagnosis not present

## 2019-07-06 DIAGNOSIS — E038 Other specified hypothyroidism: Secondary | ICD-10-CM | POA: Diagnosis not present

## 2019-07-06 DIAGNOSIS — E669 Obesity, unspecified: Secondary | ICD-10-CM | POA: Diagnosis not present

## 2019-07-06 DIAGNOSIS — E7849 Other hyperlipidemia: Secondary | ICD-10-CM | POA: Diagnosis not present

## 2019-07-06 DIAGNOSIS — E559 Vitamin D deficiency, unspecified: Secondary | ICD-10-CM | POA: Diagnosis not present

## 2019-07-06 DIAGNOSIS — Z125 Encounter for screening for malignant neoplasm of prostate: Secondary | ICD-10-CM | POA: Diagnosis not present

## 2019-07-06 DIAGNOSIS — E291 Testicular hypofunction: Secondary | ICD-10-CM | POA: Diagnosis not present

## 2019-07-07 DIAGNOSIS — R260 Ataxic gait: Secondary | ICD-10-CM | POA: Diagnosis not present

## 2019-07-07 DIAGNOSIS — R296 Repeated falls: Secondary | ICD-10-CM | POA: Diagnosis not present

## 2019-07-07 DIAGNOSIS — R531 Weakness: Secondary | ICD-10-CM | POA: Diagnosis not present

## 2019-07-07 DIAGNOSIS — M6281 Muscle weakness (generalized): Secondary | ICD-10-CM | POA: Diagnosis not present

## 2019-07-08 ENCOUNTER — Other Ambulatory Visit (HOSPITAL_COMMUNITY): Payer: Self-pay | Admitting: Cardiology

## 2019-07-08 DIAGNOSIS — R001 Bradycardia, unspecified: Secondary | ICD-10-CM | POA: Diagnosis not present

## 2019-07-08 DIAGNOSIS — N1831 Chronic kidney disease, stage 3a: Secondary | ICD-10-CM | POA: Diagnosis not present

## 2019-07-08 DIAGNOSIS — I10 Essential (primary) hypertension: Secondary | ICD-10-CM | POA: Diagnosis not present

## 2019-07-08 DIAGNOSIS — E2740 Unspecified adrenocortical insufficiency: Secondary | ICD-10-CM | POA: Diagnosis not present

## 2019-07-08 DIAGNOSIS — R2689 Other abnormalities of gait and mobility: Secondary | ICD-10-CM | POA: Diagnosis not present

## 2019-07-08 DIAGNOSIS — Z0000 Encounter for general adult medical examination without abnormal findings: Secondary | ICD-10-CM | POA: Diagnosis not present

## 2019-07-08 DIAGNOSIS — Z1331 Encounter for screening for depression: Secondary | ICD-10-CM | POA: Diagnosis not present

## 2019-07-08 DIAGNOSIS — I82401 Acute embolism and thrombosis of unspecified deep veins of right lower extremity: Secondary | ICD-10-CM | POA: Diagnosis not present

## 2019-07-08 DIAGNOSIS — G3184 Mild cognitive impairment, so stated: Secondary | ICD-10-CM | POA: Diagnosis not present

## 2019-07-08 DIAGNOSIS — I2510 Atherosclerotic heart disease of native coronary artery without angina pectoris: Secondary | ICD-10-CM | POA: Diagnosis not present

## 2019-07-08 DIAGNOSIS — J45909 Unspecified asthma, uncomplicated: Secondary | ICD-10-CM | POA: Diagnosis not present

## 2019-07-08 DIAGNOSIS — R0609 Other forms of dyspnea: Secondary | ICD-10-CM | POA: Diagnosis not present

## 2019-07-08 DIAGNOSIS — R82998 Other abnormal findings in urine: Secondary | ICD-10-CM | POA: Diagnosis not present

## 2019-07-08 DIAGNOSIS — E291 Testicular hypofunction: Secondary | ICD-10-CM | POA: Diagnosis not present

## 2019-07-08 DIAGNOSIS — I2699 Other pulmonary embolism without acute cor pulmonale: Secondary | ICD-10-CM | POA: Diagnosis not present

## 2019-07-08 MED ORDER — CARVEDILOL 12.5 MG PO TABS: 12.5000 mg | ORAL_TABLET | Freq: Two times a day (BID) | ORAL | 11 refills | Status: DC

## 2019-07-08 NOTE — Telephone Encounter (Signed)
Called [facility] to set up shipment. Spoke Daysha, she states that they are still needing consent to ship from the pt.  LMTCB x1 for pt.

## 2019-07-09 DIAGNOSIS — R296 Repeated falls: Secondary | ICD-10-CM | POA: Diagnosis not present

## 2019-07-09 DIAGNOSIS — R260 Ataxic gait: Secondary | ICD-10-CM | POA: Diagnosis not present

## 2019-07-09 DIAGNOSIS — M6281 Muscle weakness (generalized): Secondary | ICD-10-CM | POA: Diagnosis not present

## 2019-07-09 DIAGNOSIS — R531 Weakness: Secondary | ICD-10-CM | POA: Diagnosis not present

## 2019-07-10 NOTE — Telephone Encounter (Signed)
ATC pt, received a busy signal. Will try back. 

## 2019-07-13 DIAGNOSIS — R260 Ataxic gait: Secondary | ICD-10-CM | POA: Diagnosis not present

## 2019-07-13 DIAGNOSIS — R531 Weakness: Secondary | ICD-10-CM | POA: Diagnosis not present

## 2019-07-13 DIAGNOSIS — R296 Repeated falls: Secondary | ICD-10-CM | POA: Diagnosis not present

## 2019-07-13 DIAGNOSIS — M6281 Muscle weakness (generalized): Secondary | ICD-10-CM | POA: Diagnosis not present

## 2019-07-15 DIAGNOSIS — R531 Weakness: Secondary | ICD-10-CM | POA: Diagnosis not present

## 2019-07-15 DIAGNOSIS — M6281 Muscle weakness (generalized): Secondary | ICD-10-CM | POA: Diagnosis not present

## 2019-07-15 DIAGNOSIS — R260 Ataxic gait: Secondary | ICD-10-CM | POA: Diagnosis not present

## 2019-07-15 DIAGNOSIS — R296 Repeated falls: Secondary | ICD-10-CM | POA: Diagnosis not present

## 2019-07-21 DIAGNOSIS — R260 Ataxic gait: Secondary | ICD-10-CM | POA: Diagnosis not present

## 2019-07-21 DIAGNOSIS — R531 Weakness: Secondary | ICD-10-CM | POA: Diagnosis not present

## 2019-07-21 DIAGNOSIS — R296 Repeated falls: Secondary | ICD-10-CM | POA: Diagnosis not present

## 2019-07-21 DIAGNOSIS — M6281 Muscle weakness (generalized): Secondary | ICD-10-CM | POA: Diagnosis not present

## 2019-07-21 NOTE — Telephone Encounter (Signed)
Spoke with pt. He states that he has not decided if he wants to go ahead with this medication. Advised him to call us when he makes his decision.

## 2019-07-23 DIAGNOSIS — R296 Repeated falls: Secondary | ICD-10-CM | POA: Diagnosis not present

## 2019-07-23 DIAGNOSIS — R260 Ataxic gait: Secondary | ICD-10-CM | POA: Diagnosis not present

## 2019-07-23 DIAGNOSIS — M6281 Muscle weakness (generalized): Secondary | ICD-10-CM | POA: Diagnosis not present

## 2019-07-23 DIAGNOSIS — R531 Weakness: Secondary | ICD-10-CM | POA: Diagnosis not present

## 2019-07-28 DIAGNOSIS — R531 Weakness: Secondary | ICD-10-CM | POA: Diagnosis not present

## 2019-07-28 DIAGNOSIS — R296 Repeated falls: Secondary | ICD-10-CM | POA: Diagnosis not present

## 2019-07-28 DIAGNOSIS — R260 Ataxic gait: Secondary | ICD-10-CM | POA: Diagnosis not present

## 2019-07-28 DIAGNOSIS — M6281 Muscle weakness (generalized): Secondary | ICD-10-CM | POA: Diagnosis not present

## 2019-07-29 ENCOUNTER — Other Ambulatory Visit: Payer: Self-pay

## 2019-07-29 ENCOUNTER — Encounter: Payer: Self-pay | Admitting: Pulmonary Disease

## 2019-07-29 ENCOUNTER — Ambulatory Visit (INDEPENDENT_AMBULATORY_CARE_PROVIDER_SITE_OTHER): Payer: Medicare Other | Admitting: Pulmonary Disease

## 2019-07-29 VITALS — BP 120/68 | HR 66 | Temp 97.2°F | Ht 73.5 in | Wt 252.6 lb

## 2019-07-29 DIAGNOSIS — R768 Other specified abnormal immunological findings in serum: Secondary | ICD-10-CM | POA: Diagnosis not present

## 2019-07-29 DIAGNOSIS — J4540 Moderate persistent asthma, uncomplicated: Secondary | ICD-10-CM | POA: Diagnosis not present

## 2019-07-29 DIAGNOSIS — I8290 Acute embolism and thrombosis of unspecified vein: Secondary | ICD-10-CM | POA: Diagnosis not present

## 2019-07-29 DIAGNOSIS — I429 Cardiomyopathy, unspecified: Secondary | ICD-10-CM

## 2019-07-29 DIAGNOSIS — E291 Testicular hypofunction: Secondary | ICD-10-CM | POA: Diagnosis not present

## 2019-07-29 DIAGNOSIS — R942 Abnormal results of pulmonary function studies: Secondary | ICD-10-CM | POA: Diagnosis not present

## 2019-07-29 DIAGNOSIS — I272 Pulmonary hypertension, unspecified: Secondary | ICD-10-CM | POA: Diagnosis not present

## 2019-07-29 DIAGNOSIS — J309 Allergic rhinitis, unspecified: Secondary | ICD-10-CM | POA: Diagnosis not present

## 2019-07-29 DIAGNOSIS — T7849XA Other allergy, initial encounter: Secondary | ICD-10-CM

## 2019-07-29 MED ORDER — BUDESONIDE-FORMOTEROL FUMARATE 160-4.5 MCG/ACT IN AERO: 2.0000 | INHALATION_SPRAY | Freq: Two times a day (BID) | RESPIRATORY_TRACT | 5 refills | Status: DC

## 2019-07-29 MED ORDER — BUDESONIDE-FORMOTEROL FUMARATE 160-4.5 MCG/ACT IN AERO: 2.0000 | INHALATION_SPRAY | Freq: Two times a day (BID) | RESPIRATORY_TRACT | 0 refills | Status: DC

## 2019-07-29 NOTE — Progress Notes (Signed)
Synopsis: Referred in Sept 2019 for DOE by [name], MD  Subjective:   PATIENT ID: [name], [name] GENDER: male DOB: Feb 23, 1934, MRN: [id number]  Chief Complaint  Patient presents with  . Follow-up    PMH of cardiomyopathy, MI, stent placement, CAD, HLD, follows with Dr. Aundra Dubin. 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 Past 6 months worsening SOB. 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 He goes to the gym every day for an 1 hour. He never wheezes or has chest tightness. He feels like he runs out of breath. He was on allergy shots for a long time. He associates some of this of breath starting around the time he quit allergy shots back in June. Since shortness of breath with exertion.  He is able to go to the gym and walk for nearly 30 minutes without stopping.  He is able to get his heart rate up into the low 100s with exercise.  Patient denies chest pain, nausea vomiting, cough with sputum production. He does have nocturnal dyspnea.  He wakes up in the middle of the night feeling short of breath and will have to sit in the den or in his office upright for a few hours before he feels comfortable enough to lay flat again.  OV 08/21/2018: Positive RAST, positive IgE 215, our lab results returned from last office visit.  Since he was last seen he does feel as if his dyspnea on exertion may be a little bit better and his nocturnal symptoms are little better.  He still getting up in the middle of the night feeling short of breath.  He has not been having any episodes of wheezing.  He does notice that his posterior nasal drip symptoms may be slightly worse in the morning.  He still continually have to clear his sinus passages and throat in the morning.  Feel as if his hoarseness of voice is slightly worse.  He has not been rinsing out his mouth after using his inhaler.  2 weeks ago at a football game  at [facility] he fell down the stairs and landed on his right leg.  Since then the leg has been progressively swollen.  At this point he complains of calf pain with exertion.  The swelling is getting larger.  At this point his right leg is two thirds the size of his left leg.  He does have increased swelling in the legs as compared to his baseline  OV 10/09/2018: Doing well today in the office.  He does feel a little more dyspnea on exertion which is at his baseline.  He is concerned about his low heart rate.  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  He carries to 5 gallon buckets of water to all of his sheds/buildings that he stores his [place] in.  He has to keep up with his game birds.  Otherwise, he did try Breo for couple of days.  Was not really sure it made any difference so he went back to taking his Flovent.  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  Currently on Eliquis for this.  OV 04/02/2019: Patient recently seen by his allergist Dr. Remus Blake.  They increased his Flovent  to 220.  Patient is accompanied today by his wife.  She seems very frustrated that her husband not getting any better.  She also describes that he may or may not be taking his medication as directed at home.  She is also concerned that he is sleeping all the time.  He seems to have somewhat day night reversal.  He is up half of the night sleeps most during the day.  She also states that he has not been active as he was before.  He was seen by his cardiologist having a repeat cardiac catheterization.  There is plans in the works for a cardiopulmonary exercise test but this has been on hold due to COVID restrictions.  Patient continues to complain of the exact same thing which is dyspnea on exertion.  This is been going on now for several months.  But with further history taking and discussion with  the wife it appears that he only takes his medication when he "thinks he needs it".  OV 06/03/2019: Patient seen today in follow-up regarding asthma symptoms.  Wife concerned that patient is still not taking medications correctly.  Also recently saw primary care [name].  I agree with his discussion with him that may be switching to nebulized medications may be simpler.  Additionally we could start approval process for biologic.  I would prefer that he actually be on a regular inhaled steroid.  He has been using these for some time but has not seen any significant change.  As for his insomnia and day's sleep reversal I think that this is going to have to be addressed.  Discussing these behavior issues at home seems to be significant amount of distress for the patient's wife.  She also would like me to call and speak with [name] if possible.  OV 07/29/2019: Patient seen today in the office for asthma follow-up symptoms.  Overall doing better today.  He feels like he is able to walk down to the dog lot take the trash out to the road without significant symptoms.  He did receive prior authorization approval for Dupixent injections.  He wants to talk about this today in the office before moving forward with injection therapy.  He does feel that his symptoms are better on Symbicort plus as needed albuterol.  His wife is concerned that he is being less active in comparison.  There is a discussion whether or not the change in seasons has improved his symptoms.  He is worried about this coming spring.  He had a significant amount of allergic hayfever type symptoms this past spring.   Past Medical History:  Diagnosis Date  . Atrial premature beats   . BPH (benign prostatic hyperplasia)   . BPH (benign prostatic hypertrophy)   . Cataract, right eye   . Chronic low back pain   . Colon polyp   . Coronary artery disease   . DDD (degenerative disc disease), lumbar   . Dermatitis 11/16  . Elevated IgE level  06/30/2018  . Hyperlipidemia   . Insomnia   . Kidney cysts   . Memory loss 2015  . Non-ischemic cardiomyopathy ([place])   . NSTEMI (non-ST elevated myocardial infarction) ([place]) 08/25/2014  . Osteoarthritis of right knee   . Pituitary tumor 2011   macroadenoma w/ VF compromise-then gamma knife a few years leater for some recurrence  . Positive radioallergosorbent test (RAST) 06/30/2018  . Status post insertion of drug-eluting stent into left anterior descending (LAD) artery 08/28/15   +  nuc study.   . Urinary retention   . Vitamin D deficiency 01/2008     Family History  Problem Relation Age of Onset  . Congestive Heart Failure Father 49  . Other Mother 50  . Coronary artery disease Brother   . Other Sister 28       polio  . Heart attack Brother        MI/ASCAD 2006  . COPD Sister 55     Past Surgical History:  Procedure Laterality Date  . ARTHROSCOPIC SURGERY    . BACK SURGERY    . CARDIAC CATHETERIZATION  07/27/2015   Dr Aundra Dubin; oLAD 95%, mLAD 50%, CFX stent OK, OM1 60%, OM2 99%, pPLOM 50%, RCA irregular, AM2 70%, EF 55-60%, no regional wall motion abnormalities.     Marland Kitchen CARDIAC CATHETERIZATION N/A 07/28/2015   Procedure: Left Heart Cath and Coronary Angiography;  Surgeon: [name], MD;  Location: [place] [facility];  Service: Cardiovascular;  Laterality: N/A;  . CARDIAC CATHETERIZATION N/A 07/28/2015   Procedure: Coronary Stent Intervention;  Surgeon: Troy Sine, MD;  Location: [place] [facility];  Service: Cardiovascular;  Laterality: N/A;  . CATARACT EXTRACTION    . CORONARY ANGIOPLASTY WITH STENT PLACEMENT  07/27/2015   XIENCE ALPINE RX 3.0X33 DES to the LAD, 95%>>0  . HAMMER  BUNION TOE SURGERY    . HAND SURGERY Left   . LEFT HEART CATHETERIZATION WITH CORONARY ANGIOGRAM N/A 08/25/2014   Procedure: LEFT HEART CATHETERIZATION WITH CORONARY ANGIOGRAM;  Surgeon: Burnell Blanks, MD;  Location[facility];  Service: Cardiovascular;  Laterality: N/A;  .  PITUITARY SURGERY     12/01/09 Dr. Wilburn Cornelia and [name].  Marland Kitchen RIGHT/LEFT HEART CATH AND CORONARY ANGIOGRAPHY N/A 03/19/2018   Procedure: RIGHT/LEFT HEART CATH AND CORONARY ANGIOGRAPHY;  Surgeon: [name], MD;  Location: [place] [facility];  Service: Cardiovascular;  Laterality: N/A;  . RIGHT/LEFT HEART CATH AND CORONARY ANGIOGRAPHY N/A 03/13/2019   Procedure: RIGHT/LEFT HEART CATH AND CORONARY ANGIOGRAPHY;  Surgeon: [name], MD;  Location: [place] [facility];  Service: Cardiovascular;  Laterality: N/A;    Social History   Socioeconomic History  . Marital status: Married    Spouse name: [name]  . Number of children: 2  . Years of education: College  . Highest education level: Not on file  Occupational History  . Occupation: retired Programmer, multimedia: RETIRED  Social Needs  . Financial resource strain: Not on file  . Food insecurity    Worry: Not on file    Inability: Not on file  . Transportation needs    Medical: Not on file    Non-medical: Not on file  Tobacco Use  . Smoking status: Never Smoker  . Smokeless tobacco: Never Used  Substance and Sexual Activity  . Alcohol use: Yes    Comment: occas.  . Drug use: No  . Sexual activity: Not on file  Lifestyle  . Physical activity    Days per week: Not on file    Minutes per session: Not on file  . Stress: Not on file  Relationships  . Social Herbalist on phone: Not on file    Gets together: Not on file    Attends religious service: Not on file    Active member of club or organization: Not on file    Attends meetings of clubs or organizations: Not on file    Relationship status: Not on  file  . Intimate partner violence    Fear of current or ex partner: Not on file    Emotionally abused: Not on file    Physically abused: Not on file    Forced sexual activity: Not on file  Other Topics Concern  . Not on file  Social History Narrative   Patient is married [name]).   Patient has two  children.   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   Patient is right-handed.   Patient does not drink any caffeine.     Allergies  Allergen Reactions  . Sulfa Antibiotics Anaphylaxis, Nausea And Vomiting and Swelling  . Sulfamethoxazole Anaphylaxis, Nausea And Vomiting and Swelling  . Sulfonamide Derivatives Anaphylaxis, Nausea And Vomiting and Swelling  . Bee Venom Rash  . Flonase [Fluticasone] Other (See Comments)    Caused nose bleeds  . Tamsulosin Other (See Comments)    Unknown reaction- Patient doesn't think he is allergic (??)     Outpatient Medications Prior to Visit  Medication Sig Dispense Refill  . albuterol (VENTOLIN HFA) 108 (90 Base) MCG/ACT inhaler Inhale 2 puffs into the lungs every 6 (six) hours as needed for wheezing or shortness of breath. 3 Inhaler 1  . apixaban (ELIQUIS) 2.5 MG TABS tablet Take 1 tablet (2.5 mg total) by mouth 2 (two) times daily. 180 tablet 3  . aspirin EC 81 MG tablet Take 1 tablet (81 mg total) by mouth daily. 90 tablet 3  . budesonide-formoterol (SYMBICORT) 160-4.5 MCG/ACT inhaler Inhale 2 puffs into the lungs 2 (two) times a day. 1 Inhaler 5  . budesonide-formoterol (SYMBICORT) 160-4.5 MCG/ACT inhaler Inhale 2 puffs into the lungs every 12 (twelve) hours. 2 Inhaler 0  . budesonide-formoterol (SYMBICORT) 160-4.5 MCG/ACT inhaler Inhale 2 puffs into the lungs 2 (two) times daily. 2 Inhaler 0  . buPROPion (WELLBUTRIN XL) 150 MG 24 hr tablet Take 1 tablet by mouth daily.    Marland Kitchen BYSTOLIC 2.5 MG tablet TAKE 1/2 TABLET(1.25 MG) BY MOUTH DAILY 15 tablet 6  . cabergoline (DOSTINEX) 0.5 MG tablet Take 0.5 mg by mouth once a week.    . carvedilol (COREG) 12.5 MG tablet Take 1 tablet (12.5 mg total) by mouth 2 (two) times daily with a meal. 60 tablet 11  . dupilumab (DUPIXENT) 200 MG/1.14ML prefilled syringe Inject 200 mg into the skin every 14 (fourteen) days. 2.24 mL 11  . Evolocumab (REPATHA) 140 MG/ML SOSY Inject into the skin  daily.    Marland Kitchen ezetimibe (ZETIA) 10 MG tablet TAKE 1 TABLET(10 MG) BY MOUTH DAILY 30 tablet 3  . fluticasone (FLONASE SENSIMIST) 27.5 MCG/SPRAY nasal spray Place 2 sprays into the nose daily.    . hydrocortisone (CORTEF) 20 MG tablet Take 20 mg by mouth 2 (two) times daily.    Vanessa Kick Ethyl 1 g CAPS Take 2 capsules (2 g total) by mouth 2 (two) times a day. 120 capsule 11  . levothyroxine (SYNTHROID, LEVOTHROID) 150 MCG tablet Take 150 mcg by mouth at bedtime.     Marland Kitchen loratadine (CLARITIN) 10 MG tablet Take 10 mg by mouth daily.    . montelukast (SINGULAIR) 10 MG tablet Take 1 tablet (10 mg total) by mouth at bedtime. 30 tablet 11  . MYRBETRIQ 50 MG TB24 tablet Take 50 mg by mouth at bedtime.   0  . nitroGLYCERIN (NITROSTAT) 0.4 MG SL tablet Place 1 tablet (0.4 mg total) under the tongue every 5 (five) minutes as needed for chest pain.  90 tablet 3  . omeprazole (PRILOSEC) 40 MG capsule Take 40 mg by mouth daily.    . potassium chloride (K-DUR) 10 MEQ tablet Take 2 tablets (20 mEq total) by mouth daily. 180 tablet 3  . ranolazine (RANEXA) 500 MG 12 hr tablet Take 1 tablet (500 mg total) by mouth 2 (two) times daily. 60 tablet 6  . Red Yeast Rice Extract (RED YEAST RICE PO) Take by mouth daily.    Marland Kitchen testosterone cypionate (DEPOTESTOTERONE CYPIONATE) 200 MG/ML injection Inject into the muscle every 21 ( twenty-one) days. 1.62 % injection    . torsemide (DEMADEX) 10 MG tablet Take 1 tablet (10 mg total) by mouth daily. 30 tablet 3  . triamcinolone cream (KENALOG) 0.1 % Apply 1 application topically 2 (two) times daily.    Marland Kitchen arformoterol (BROVANA) 15 MCG/2ML NEBU Take 2 mLs (15 mcg total) by nebulization 2 (two) times daily. 120 mL 1  . budesonide (PULMICORT) 0.5 MG/2ML nebulizer solution Take 2 mLs (0.5 mg total) by nebulization 2 (two) times daily. 120 mL 1  . ipratropium-albuterol (DUONEB) 0.5-2.5 (3) MG/3ML SOLN Take 3 mLs by nebulization every 4 (four) hours as needed (Take 58ml every 4 hours and prn  for wheezing and shortness of breath). 360 mL 1   Facility-Administered Medications Prior to Visit  Medication Dose Route Frequency Provider Last Rate Last Dose  . sodium chloride flush (NS) 0.9 % injection 3 mL  3 mL Intravenous Q12H [name], MD        Review of Systems  Constitutional: Negative for chills, fever, malaise/fatigue and weight loss.  HENT: Negative for hearing loss, sore throat and tinnitus.   Eyes: Negative for blurred vision and double vision.  Respiratory: Positive for cough and shortness of breath. Negative for hemoptysis, sputum production, wheezing and stridor.   Cardiovascular: Negative for chest pain, palpitations, orthopnea, leg swelling and PND.  Gastrointestinal: Negative for abdominal pain, constipation, diarrhea, heartburn, nausea and vomiting.  Genitourinary: Negative for dysuria, hematuria and urgency.  Musculoskeletal: Negative for joint pain and myalgias.  Skin: Negative for itching and rash.  Neurological: Negative for dizziness, tingling, weakness and headaches.  Endo/Heme/Allergies: Negative for environmental allergies. Does not bruise/bleed easily.  Psychiatric/Behavioral: Negative for depression. The patient is not nervous/anxious and does not have insomnia.   All other systems reviewed and are negative.    Objective:  Physical Exam Vitals signs reviewed.  Constitutional:      General: He is not in acute distress.    Appearance: He is well-developed.  HENT:     Head: Normocephalic and atraumatic.  Eyes:     General: No scleral icterus.    Conjunctiva/sclera: Conjunctivae normal.     Pupils: Pupils are equal, round, and reactive to light.  Neck:     Musculoskeletal: Neck supple.     Vascular: No JVD.     Trachea: No tracheal deviation.  Cardiovascular:     Rate and Rhythm: Normal rate and regular rhythm.     Heart sounds: Normal heart sounds. No murmur.  Pulmonary:     Effort: Pulmonary effort is normal. No tachypnea, accessory  muscle usage or respiratory distress.     Breath sounds: No stridor. No wheezing, rhonchi or rales.  Abdominal:     General: Bowel sounds are normal. There is no distension.     Palpations: Abdomen is soft.     Tenderness: There is no abdominal tenderness.  Musculoskeletal:        General: No tenderness.  Right lower leg: Edema present.     Left lower leg: Edema present.  Lymphadenopathy:     Cervical: No cervical adenopathy.  Skin:    General: Skin is warm and dry.     Capillary Refill: Capillary refill takes less than 2 seconds.     Findings: No rash.  Neurological:     Mental Status: He is alert and oriented to person, place, and time.  Psychiatric:        Behavior: Behavior normal.      Vitals:   07/29/19 1026  BP: 120/68  Pulse: 66  Temp: (!) 97.2 F (36.2 C)  TempSrc: Oral  SpO2: 94%  Weight: 252 lb 9.6 oz (114.6 kg)  Height: 6' 1.5" (1.867 m)   94% on RA BMI Readings from Last 3 Encounters:  07/29/19 32.87 kg/m  06/03/19 29.90 kg/m  04/02/19 30.49 kg/m   Wt Readings from Last 3 Encounters:  07/29/19 252 lb 9.6 oz (114.6 kg)  06/03/19 242 lb 6.4 oz (110 kg)  04/02/19 247 lb 3.2 oz (112.1 kg)     CBC    Component Value Date/Time   WBC 11.6 (H) 03/10/2019 1310   RBC 4.08 (L) 03/10/2019 1310   HGB 11.9 (L) 03/13/2019 1334   HGB 11.6 (L) 03/13/2019 1334   HCT 35.0 (L) 03/13/2019 1334   HCT 34.0 (L) 03/13/2019 1334   PLT 231 03/10/2019 1310   MCV 95.3 03/10/2019 1310   MCH 31.9 03/10/2019 1310   MCHC 33.4 03/10/2019 1310   RDW 14.6 03/10/2019 1310   LYMPHSABS 1.7 08/21/2018 1705   MONOABS 0.7 08/21/2018 1705   EOSABS 0.3 08/21/2018 1705   BASOSABS 0.1 08/21/2018 1705    Pulmonary Functions Testing Results: PFT Results Latest Ref Rng & Units 04/17/2018  FVC-Pre L 4.51  FVC-Predicted Pre % 93  FVC-Post L 4.76  FVC-Predicted Post % 98  Pre FEV1/FVC % % 78  Post FEV1/FCV % % 77  FEV1-Pre L 3.51  FEV1-Predicted Pre % 101  FEV1-Post L 3.68   DLCO UNC% % 51  DLCO COR %Predicted % 57  TLC L 7.54  TLC % Predicted % 93  RV % Predicted % 100   Regional allergy panel: Positive for multiple items to include cat dander, dog, cockroach, multiple trees. IGE 215  Chest Imaging: CT chest 06/27/2018: No significant parenchymal abnormality. The patient's images have been independently reviewed by me.    FeNO: None   Pathology: None   Echocardiogram: None   Heart Catheterization:  Fick Cardiac Output 6.26 L/min  Fick Cardiac Output Index 2.64 (L/min)/BSA  RA A Wave 12 mmHg  RA V Wave 11 mmHg  RA Mean 8 mmHg  RV Systolic Pressure 29 mmHg  RV Diastolic Pressure 5 mmHg  RV EDP 11 mmHg  PA Systolic Pressure 31 mmHg  PA Diastolic Pressure 17 mmHg  PA Mean 21 mmHg  AO Systolic Pressure [id number] mmHg  AO Diastolic Pressure 76 mmHg  AO Mean [id number] mmHg  LV Systolic Pressure Q000111Q mmHg  LV Diastolic Pressure 9 mmHg  LV EDP 20 mmHg  AOp Systolic Pressure 123456 mmHg  AOp Diastolic Pressure 68 mmHg  AOp Mean Pressure 123XX123 mmHg  LVp Systolic Pressure Q000111Q mmHg  LVp Diastolic Pressure 9 mmHg  LVp EDP Pressure 19 mmHg  QP/QS 1  TPVR Index 7.95 HRUI  TSVR Index 38.98 HRUI  TPVR/TSVR Ratio 0.2   PVR = 2.0 [place]    Assessment & Plan:     ICD-10-CM  1. Positive radioallergosorbent test (RAST)  T78.49XA   2. Elevated IgE level  R76.8   3. Possible Pulmonary hypertension (HCC)  I27.20   4. Cardiomyopathy, unspecified type ([place])  I42.9   5. Decreased diffusion capacity  R94.2   6. Moderate persistent asthma without complication  123456   7. Allergic rhinitis, unspecified seasonality, unspecified trigger  J30.9   8. VTE (venous thromboembolism)  I82.90      Discussion:  85 year old gentleman here today for follow-up regarding chronic dyspnea.  This is a multifactorial etiology as discussed previously.  Some of this is likely related to his asthma symptoms, positive Rast panel, elevated IgE.  Also has seasonal symptoms which he believes  are worse in the springtime early summer.  We did receive approval for initiation of Dupixent.  He wanted to talk about the pros cons and side effects of this medication today.  Today in the office we discussed risk benefits and alternatives of proceeding with biologic injection therapy.  He wants to discuss with his insurance company about his overall cost for this medication before moving forward.  He has a mildly reduced DLCO and a prior right heart catheterization with elevated mean PA pressures.  He is also on beta-blockade for heart failure/cardiomyopathy.  Also has a history of bilateral PE and right lower extremity VTE on Eliquis.  All of which I believe play into his history of chronic dyspnea.  Plan: We will give him samples today of Symbicort to help with medication cost and coverage. New prescription for Symbicort and will likely need prior authorization per the wife after discussion with pharmacy. The patient has tried and failed [place] as well as Flovent in the past. The Symbicort is helping in improving his symptoms and should be approved by his insurance. Patient and wife to call insurance company to discuss total cost for [place]. Likely to call and schedule appointment to initiate [place] therapy. Patient is agreeable.  Patient return to clinic or as needed in 6 months.  Greater than 50% of this patient's 25-minute of visit was spent face-to-face discussing above recommendations and treatment plan.    Current Outpatient Medications:  .  albuterol (VENTOLIN HFA) 108 (90 Base) MCG/ACT inhaler, Inhale 2 puffs into the lungs every 6 (six) hours as needed for wheezing or shortness of breath., Disp: 3 Inhaler, Rfl: 1 .  apixaban (ELIQUIS) 2.5 MG TABS tablet, Take 1 tablet (2.5 mg total) by mouth 2 (two) times daily., Disp: 180 tablet, Rfl: 3 .  aspirin EC 81 MG tablet, Take 1 tablet (81 mg total) by mouth daily., Disp: 90 tablet, Rfl: 3 .  budesonide-formoterol  (SYMBICORT) 160-4.5 MCG/ACT inhaler, Inhale 2 puffs into the lungs 2 (two) times a day., Disp: 1 Inhaler, Rfl: 5 .  budesonide-formoterol (SYMBICORT) 160-4.5 MCG/ACT inhaler, Inhale 2 puffs into the lungs every 12 (twelve) hours., Disp: 2 Inhaler, Rfl: 0 .  budesonide-formoterol (SYMBICORT) 160-4.5 MCG/ACT inhaler, Inhale 2 puffs into the lungs 2 (two) times daily., Disp: 2 Inhaler, Rfl: 0 .  buPROPion (WELLBUTRIN XL) 150 MG 24 hr tablet, Take 1 tablet by mouth daily., Disp: , Rfl:  .  BYSTOLIC 2.5 MG tablet, TAKE 1/2 TABLET(1.25 MG) BY MOUTH DAILY, Disp: 15 tablet, Rfl: 6 .  cabergoline (DOSTINEX) 0.5 MG tablet, Take 0.5 mg by mouth once a week., Disp: , Rfl:  .  carvedilol (COREG) 12.5 MG tablet, Take 1 tablet (12.5 mg total) by mouth 2 (two) times daily with a meal., Disp: 60 tablet, Rfl:  11 .  dupilumab (DUPIXENT) 200 MG/1.14ML prefilled syringe, Inject 200 mg into the skin every 14 (fourteen) days., Disp: 2.24 mL, Rfl: 11 .  Evolocumab (REPATHA) 140 MG/ML SOSY, Inject into the skin daily., Disp: , Rfl:  .  ezetimibe (ZETIA) 10 MG tablet, TAKE 1 TABLET(10 MG) BY MOUTH DAILY, Disp: 30 tablet, Rfl: 3 .  fluticasone (FLONASE SENSIMIST) 27.5 MCG/SPRAY nasal spray, Place 2 sprays into the nose daily., Disp: , Rfl:  .  hydrocortisone (CORTEF) 20 MG tablet, Take 20 mg by mouth 2 (two) times daily., Disp: , Rfl:  .  Icosapent Ethyl 1 g CAPS, Take 2 capsules (2 g total) by mouth 2 (two) times a day., Disp: 120 capsule, Rfl: 11 .  levothyroxine (SYNTHROID, LEVOTHROID) 150 MCG tablet, Take 150 mcg by mouth at bedtime. , Disp: , Rfl:  .  loratadine (CLARITIN) 10 MG tablet, Take 10 mg by mouth daily., Disp: , Rfl:  .  montelukast (SINGULAIR) 10 MG tablet, Take 1 tablet (10 mg total) by mouth at bedtime., Disp: 30 tablet, Rfl: 11 .  MYRBETRIQ 50 MG TB24 tablet, Take 50 mg by mouth at bedtime. , Disp: , Rfl: 0 .  nitroGLYCERIN (NITROSTAT) 0.4 MG SL tablet, Place 1 tablet (0.4 mg total) under the tongue every 5  (five) minutes as needed for chest pain., Disp: 90 tablet, Rfl: 3 .  omeprazole (PRILOSEC) 40 MG capsule, Take 40 mg by mouth daily., Disp: , Rfl:  .  potassium chloride (K-DUR) 10 MEQ tablet, Take 2 tablets (20 mEq total) by mouth daily., Disp: 180 tablet, Rfl: 3 .  ranolazine (RANEXA) 500 MG 12 hr tablet, Take 1 tablet (500 mg total) by mouth 2 (two) times daily., Disp: 60 tablet, Rfl: 6 .  Red Yeast Rice Extract (RED YEAST RICE PO), Take by mouth daily., Disp: , Rfl:  .  testosterone cypionate (DEPOTESTOTERONE CYPIONATE) 200 MG/ML injection, Inject into the muscle every 21 ( twenty-one) days. 1.62 % injection, Disp: , Rfl:  .  torsemide (DEMADEX) 10 MG tablet, Take 1 tablet (10 mg total) by mouth daily., Disp: 30 tablet, Rfl: 3 .  triamcinolone cream (KENALOG) 0.1 %, Apply 1 application topically 2 (two) times daily., Disp: , Rfl:  .  arformoterol (BROVANA) 15 MCG/2ML NEBU, Take 2 mLs (15 mcg total) by nebulization 2 (two) times daily., Disp: 120 mL, Rfl: 1 .  budesonide (PULMICORT) 0.5 MG/2ML nebulizer solution, Take 2 mLs (0.5 mg total) by nebulization 2 (two) times daily., Disp: 120 mL, Rfl: 1 .  ipratropium-albuterol (DUONEB) 0.5-2.5 (3) MG/3ML SOLN, Take 3 mLs by nebulization every 4 (four) hours as needed (Take 48ml every 4 hours and prn for wheezing and shortness of breath)., Disp: 360 mL, Rfl: 1 No current facility-administered medications for this visit.   Facility-Administered Medications Ordered in Other Visits:  .  sodium chloride flush (NS) 0.9 % injection 3 mL, 3 mL, Intravenous, Q12H, [name], MD   Garner Nash, DO [facility] 07/29/2019 10:52 AM

## 2019-07-29 NOTE — Addendum Note (Signed)
Addended by: Jannette Spanner on: 07/29/2019 11:49 AM   Modules accepted: Orders

## 2019-07-29 NOTE — Addendum Note (Signed)
Addended by: Jannette Spanner on: 07/29/2019 11:26 AM   Modules accepted: Orders

## 2019-07-29 NOTE — Patient Instructions (Addendum)
Thank you for visiting Dr. Valeta Harms at [facility]. Today we recommend the following:  New prescription for Symbicort 160 Continue albuterol for shortness of breath and wheezing. Let us know about your desire to start Dupixent injections.  Return in about 6 months (around 01/27/2020), or if symptoms worsen or fail to improve, for with APP or Dr. Valeta Harms.    Please do your part to reduce the spread of COVID-19.

## 2019-07-30 DIAGNOSIS — R296 Repeated falls: Secondary | ICD-10-CM | POA: Diagnosis not present

## 2019-07-30 DIAGNOSIS — R260 Ataxic gait: Secondary | ICD-10-CM | POA: Diagnosis not present

## 2019-07-30 DIAGNOSIS — R531 Weakness: Secondary | ICD-10-CM | POA: Diagnosis not present

## 2019-07-30 DIAGNOSIS — M6281 Muscle weakness (generalized): Secondary | ICD-10-CM | POA: Diagnosis not present

## 2019-07-31 ENCOUNTER — Other Ambulatory Visit (HOSPITAL_COMMUNITY): Payer: Self-pay

## 2019-07-31 DIAGNOSIS — H353131 Nonexudative age-related macular degeneration, bilateral, early dry stage: Secondary | ICD-10-CM | POA: Diagnosis not present

## 2019-07-31 MED ORDER — POTASSIUM CHLORIDE ER 10 MEQ PO TBCR: 20.0000 meq | EXTENDED_RELEASE_TABLET | Freq: Every day | ORAL | 3 refills | Status: DC

## 2019-08-04 ENCOUNTER — Telehealth: Payer: Self-pay | Admitting: Pulmonary Disease

## 2019-08-04 DIAGNOSIS — R296 Repeated falls: Secondary | ICD-10-CM | POA: Diagnosis not present

## 2019-08-04 DIAGNOSIS — R260 Ataxic gait: Secondary | ICD-10-CM | POA: Diagnosis not present

## 2019-08-04 DIAGNOSIS — R531 Weakness: Secondary | ICD-10-CM | POA: Diagnosis not present

## 2019-08-04 DIAGNOSIS — M6281 Muscle weakness (generalized): Secondary | ICD-10-CM | POA: Diagnosis not present

## 2019-08-04 NOTE — Telephone Encounter (Signed)
Medication name and strength: Symbicort 15mcg Provider: BI Pharmacy: [facility] on [address] Patient insurance ID: [id number] Phone:  Fax:   Was the PA started on CMM?  Yes If yes, please enter the Key: [id number] Timeframe for approval/denial: 1-3 business days

## 2019-08-04 NOTE — Telephone Encounter (Signed)
PA was denied. Will await denial letter from [facility]

## 2019-08-07 DIAGNOSIS — R531 Weakness: Secondary | ICD-10-CM | POA: Diagnosis not present

## 2019-08-07 DIAGNOSIS — R296 Repeated falls: Secondary | ICD-10-CM | POA: Diagnosis not present

## 2019-08-07 DIAGNOSIS — M6281 Muscle weakness (generalized): Secondary | ICD-10-CM | POA: Diagnosis not present

## 2019-08-07 DIAGNOSIS — R260 Ataxic gait: Secondary | ICD-10-CM | POA: Diagnosis not present

## 2019-08-13 ENCOUNTER — Telehealth: Payer: Self-pay | Admitting: Pulmonary Disease

## 2019-08-13 DIAGNOSIS — R260 Ataxic gait: Secondary | ICD-10-CM | POA: Diagnosis not present

## 2019-08-13 DIAGNOSIS — R531 Weakness: Secondary | ICD-10-CM | POA: Diagnosis not present

## 2019-08-13 DIAGNOSIS — M6281 Muscle weakness (generalized): Secondary | ICD-10-CM | POA: Diagnosis not present

## 2019-08-13 DIAGNOSIS — R296 Repeated falls: Secondary | ICD-10-CM | POA: Diagnosis not present

## 2019-08-13 MED ORDER — DUPIXENT 200 MG/1.14ML ~~LOC~~ SOSY: 400.0000 mg | PREFILLED_SYRINGE | Freq: Once | SUBCUTANEOUS | 0 refills | Status: AC

## 2019-08-13 MED ORDER — DUPIXENT 200 MG/1.14ML ~~LOC~~ SOSY: 200.0000 mg | PREFILLED_SYRINGE | SUBCUTANEOUS | 12 refills | Status: DC

## 2019-08-13 NOTE — Telephone Encounter (Signed)
Spoke with pt's wife, [name]. They have decided that they would like to start Schleicher therapy. We have already gotten this approved for the pt. Rx for loading and maintenance doses have been sent to [facility]. Will follow up with [facility] to set up shipment.

## 2019-08-17 NOTE — Telephone Encounter (Signed)
Called [facility] to schedule Dupixent shipment.   Dupixent Order: 200mg  #2 prefilled syringe Ordered Date: 08/17/19 Expected date of arrival: 08/19/19 Ordered by: [place]: [facility]  Will follow up with Patient once Dupixent received.

## 2019-08-18 DIAGNOSIS — R296 Repeated falls: Secondary | ICD-10-CM | POA: Diagnosis not present

## 2019-08-18 DIAGNOSIS — M6281 Muscle weakness (generalized): Secondary | ICD-10-CM | POA: Diagnosis not present

## 2019-08-18 DIAGNOSIS — R531 Weakness: Secondary | ICD-10-CM | POA: Diagnosis not present

## 2019-08-18 DIAGNOSIS — R260 Ataxic gait: Secondary | ICD-10-CM | POA: Diagnosis not present

## 2019-08-19 NOTE — Telephone Encounter (Signed)
Dupixent Shipment Received: 200mg  #2 prefilled syringe Medication arrival date: 08/19/19 Lot #: [id number] Exp date: 08/31/2020 Received by: Donzetta Matters and spoke with Patient's Wife, [name] (DPR).  Patient is scheduled 08/24/19 at 1330 for first Dupixent injection.  [name] stated Patient has Epi pen and understood to bring it with him.  Explained 2 hour stay after first injection and 20 - 30 min stay after 2nd injection.  Understanding stated by [name].  Nothing further at this time.

## 2019-08-20 DIAGNOSIS — R296 Repeated falls: Secondary | ICD-10-CM | POA: Diagnosis not present

## 2019-08-20 DIAGNOSIS — M6281 Muscle weakness (generalized): Secondary | ICD-10-CM | POA: Diagnosis not present

## 2019-08-20 DIAGNOSIS — R531 Weakness: Secondary | ICD-10-CM | POA: Diagnosis not present

## 2019-08-20 DIAGNOSIS — R260 Ataxic gait: Secondary | ICD-10-CM | POA: Diagnosis not present

## 2019-08-24 ENCOUNTER — Telehealth: Payer: Self-pay | Admitting: Pulmonary Disease

## 2019-08-24 ENCOUNTER — Other Ambulatory Visit: Payer: Self-pay

## 2019-08-24 ENCOUNTER — Ambulatory Visit (INDEPENDENT_AMBULATORY_CARE_PROVIDER_SITE_OTHER): Payer: Medicare Other

## 2019-08-24 DIAGNOSIS — J4540 Moderate persistent asthma, uncomplicated: Secondary | ICD-10-CM | POA: Diagnosis not present

## 2019-08-24 MED ORDER — DUPILUMAB 200 MG/1.14ML ~~LOC~~ SOSY
400.0000 mg | PREFILLED_SYRINGE | Freq: Once | SUBCUTANEOUS | Status: AC
  Administered 2019-08-24: 400 mg via SUBCUTANEOUS

## 2019-08-24 NOTE — Patient Instructions (Addendum)
Pt's initial injections of Dupixent were administered today at 1:45.  Pt tolerated injections well. Pt underwent 2 hour post-injection observation per office protocol.

## 2019-08-25 NOTE — Telephone Encounter (Signed)
Patient already received injection yesterday afternoon.  Will close encounter.

## 2019-08-26 DIAGNOSIS — M6281 Muscle weakness (generalized): Secondary | ICD-10-CM | POA: Diagnosis not present

## 2019-08-26 DIAGNOSIS — R296 Repeated falls: Secondary | ICD-10-CM | POA: Diagnosis not present

## 2019-08-26 DIAGNOSIS — R531 Weakness: Secondary | ICD-10-CM | POA: Diagnosis not present

## 2019-08-26 DIAGNOSIS — R260 Ataxic gait: Secondary | ICD-10-CM | POA: Diagnosis not present

## 2019-08-31 ENCOUNTER — Telehealth: Payer: Self-pay

## 2019-08-31 DIAGNOSIS — R531 Weakness: Secondary | ICD-10-CM | POA: Diagnosis not present

## 2019-08-31 DIAGNOSIS — R260 Ataxic gait: Secondary | ICD-10-CM | POA: Diagnosis not present

## 2019-08-31 DIAGNOSIS — M6281 Muscle weakness (generalized): Secondary | ICD-10-CM | POA: Diagnosis not present

## 2019-08-31 DIAGNOSIS — R296 Repeated falls: Secondary | ICD-10-CM | POA: Diagnosis not present

## 2019-08-31 NOTE — Telephone Encounter (Signed)
Dupixent Order: 200mg  #2 prefilled syringe Ordered Date: 08/31/19  Expected date of arrival: Pharmacy needs to call patient to confirm approval for delivery.  Pharmacy will call back to schedule this when approval is received.  Ordered by: Len Blalock, [place]  Specialty Pharmacy: [facility]

## 2019-09-03 DIAGNOSIS — E291 Testicular hypofunction: Secondary | ICD-10-CM | POA: Diagnosis not present

## 2019-09-07 NOTE — Telephone Encounter (Signed)
Called [facility] to follow up on scheduled shipment. Patient has given consent. Dupixent shipment scheduled.  Dupixent Order: 200mg  #2 prefilled syringe Ordered Date: 09/07/19 Expected date of arrival: 09/08/19 Ordered by: [place]: [facility]

## 2019-09-08 ENCOUNTER — Other Ambulatory Visit: Payer: Self-pay

## 2019-09-08 ENCOUNTER — Ambulatory Visit (INDEPENDENT_AMBULATORY_CARE_PROVIDER_SITE_OTHER): Payer: Medicare Other

## 2019-09-08 ENCOUNTER — Other Ambulatory Visit: Payer: Self-pay | Admitting: *Deleted

## 2019-09-08 DIAGNOSIS — J4540 Moderate persistent asthma, uncomplicated: Secondary | ICD-10-CM | POA: Diagnosis not present

## 2019-09-08 MED ORDER — TORSEMIDE 10 MG PO TABS: 10.0000 mg | ORAL_TABLET | Freq: Every day | ORAL | 1 refills | Status: DC

## 2019-09-08 MED ORDER — DUPILUMAB 200 MG/1.14ML ~~LOC~~ SOSY
200.0000 mg | PREFILLED_SYRINGE | Freq: Once | SUBCUTANEOUS | Status: AC
  Administered 2019-09-08: 200 mg via SUBCUTANEOUS

## 2019-09-08 NOTE — Progress Notes (Signed)
Have you been hospitalized within the last 10 days?  No Do you have a fever?  No Do you have a cough?  No Do you have a headache or sore throat? No Do you have your Epi Pen visible and is it within date?  Yes   Patient arrived today for second Dupixent injection. Dupixent 200mg  injection given at 1435. Patient assessed for 15 minutes after injection per protocol.  Patient denies any signs or symptoms of adverse reactions. No signs of redness or swelling at injection site.  Patient scheduled for next injection 09/22/19.  Patient discharged home.

## 2019-09-08 NOTE — Telephone Encounter (Signed)
Rx has been sent to the pharmacy electronically. ° °

## 2019-09-08 NOTE — Telephone Encounter (Signed)
Dupixent Shipment Received: 200mg  #2 prefilled syringe Medication arrival date: 09/08/19 Lot #: [id number] Exp date: 11/01/2020 Received by: Elliot Dally

## 2019-09-21 ENCOUNTER — Telehealth: Payer: Self-pay | Admitting: Pulmonary Disease

## 2019-09-21 NOTE — Telephone Encounter (Signed)
Spoke with pt's wife, [name]. Pt has been rescheduled for his Dupixent injection on 09/22/2019 at 1130. Nothing further was needed.

## 2019-09-22 ENCOUNTER — Ambulatory Visit (INDEPENDENT_AMBULATORY_CARE_PROVIDER_SITE_OTHER): Payer: Medicare Other

## 2019-09-22 ENCOUNTER — Ambulatory Visit: Payer: Medicare Other

## 2019-09-22 ENCOUNTER — Other Ambulatory Visit: Payer: Self-pay

## 2019-09-22 DIAGNOSIS — J4540 Moderate persistent asthma, uncomplicated: Secondary | ICD-10-CM

## 2019-09-22 MED ORDER — DUPILUMAB 200 MG/1.14ML ~~LOC~~ SOSY
200.0000 mg | PREFILLED_SYRINGE | Freq: Once | SUBCUTANEOUS | Status: AC
  Administered 2019-09-22: 200 mg via SUBCUTANEOUS

## 2019-09-22 NOTE — Progress Notes (Signed)
All questions were answered by the patient before medication was administered. Have you been hospitalized in the last 10 days? No Do you have a fever? No Do you have a cough? No Do you have a headache or sore throat? No  

## 2019-10-06 ENCOUNTER — Telehealth: Payer: Self-pay | Admitting: Pulmonary Disease

## 2019-10-06 NOTE — Telephone Encounter (Signed)
We do not have any medication in stock for the pt.  While on the phone with [facility], was advised that the pt's copay was over $1000. I spoke with pt's wife and made her aware of this. She states that there was supposed to be a shipment coming to [place] in the next few days. Will await shipment per pt's wife.

## 2019-10-06 NOTE — Telephone Encounter (Signed)
Pt's wife called back. She states that she spoke with the pharmacy and there is an issue with the pt's copay. Pt's wife asked that we not set up shipment at this time. Pt doesn't feel like this medication is helping any way.

## 2019-10-08 DIAGNOSIS — I87319 Chronic venous hypertension (idiopathic) with ulcer of unspecified lower extremity: Secondary | ICD-10-CM | POA: Diagnosis not present

## 2019-10-08 DIAGNOSIS — L03116 Cellulitis of left lower limb: Secondary | ICD-10-CM | POA: Diagnosis not present

## 2019-10-12 ENCOUNTER — Other Ambulatory Visit: Payer: Self-pay

## 2019-10-12 ENCOUNTER — Emergency Department (HOSPITAL_COMMUNITY): Payer: Medicare Other

## 2019-10-12 ENCOUNTER — Inpatient Hospital Stay (HOSPITAL_COMMUNITY)
Admission: EM | Admit: 2019-10-12 | Discharge: 2019-10-15 | DRG: 602 | Disposition: A | Discharge status: Home / Self Care | Payer: Medicare Other | Attending: Internal Medicine | Admitting: Internal Medicine

## 2019-10-12 ENCOUNTER — Encounter (HOSPITAL_COMMUNITY): Payer: Self-pay | Admitting: Family Medicine

## 2019-10-12 DIAGNOSIS — E2740 Unspecified adrenocortical insufficiency: Secondary | ICD-10-CM | POA: Diagnosis present

## 2019-10-12 DIAGNOSIS — I5032 Chronic diastolic (congestive) heart failure: Secondary | ICD-10-CM | POA: Diagnosis not present

## 2019-10-12 DIAGNOSIS — Z888 Allergy status to other drugs, medicaments and biological substances status: Secondary | ICD-10-CM

## 2019-10-12 DIAGNOSIS — I2699 Other pulmonary embolism without acute cor pulmonale: Secondary | ICD-10-CM | POA: Diagnosis present

## 2019-10-12 DIAGNOSIS — I428 Other cardiomyopathies: Secondary | ICD-10-CM | POA: Diagnosis present

## 2019-10-12 DIAGNOSIS — N400 Enlarged prostate without lower urinary tract symptoms: Secondary | ICD-10-CM | POA: Diagnosis present

## 2019-10-12 DIAGNOSIS — Z79899 Other long term (current) drug therapy: Secondary | ICD-10-CM

## 2019-10-12 DIAGNOSIS — N183 Chronic kidney disease, stage 3 unspecified: Secondary | ICD-10-CM | POA: Diagnosis present

## 2019-10-12 DIAGNOSIS — Z86718 Personal history of other venous thrombosis and embolism: Secondary | ICD-10-CM

## 2019-10-12 DIAGNOSIS — E785 Hyperlipidemia, unspecified: Secondary | ICD-10-CM | POA: Diagnosis present

## 2019-10-12 DIAGNOSIS — Z86018 Personal history of other benign neoplasm: Secondary | ICD-10-CM

## 2019-10-12 DIAGNOSIS — U071 COVID-19: Secondary | ICD-10-CM | POA: Diagnosis present

## 2019-10-12 DIAGNOSIS — M5136 Other intervertebral disc degeneration, lumbar region: Secondary | ICD-10-CM | POA: Diagnosis present

## 2019-10-12 DIAGNOSIS — G4700 Insomnia, unspecified: Secondary | ICD-10-CM | POA: Diagnosis present

## 2019-10-12 DIAGNOSIS — Z86010 Personal history of colonic polyps: Secondary | ICD-10-CM

## 2019-10-12 DIAGNOSIS — Z85828 Personal history of other malignant neoplasm of skin: Secondary | ICD-10-CM

## 2019-10-12 DIAGNOSIS — Z7982 Long term (current) use of aspirin: Secondary | ICD-10-CM

## 2019-10-12 DIAGNOSIS — Z825 Family history of asthma and other chronic lower respiratory diseases: Secondary | ICD-10-CM

## 2019-10-12 DIAGNOSIS — Z86711 Personal history of pulmonary embolism: Secondary | ICD-10-CM

## 2019-10-12 DIAGNOSIS — Z955 Presence of coronary angioplasty implant and graft: Secondary | ICD-10-CM

## 2019-10-12 DIAGNOSIS — H9190 Unspecified hearing loss, unspecified ear: Secondary | ICD-10-CM | POA: Diagnosis present

## 2019-10-12 DIAGNOSIS — L03116 Cellulitis of left lower limb: Secondary | ICD-10-CM | POA: Diagnosis not present

## 2019-10-12 DIAGNOSIS — H269 Unspecified cataract: Secondary | ICD-10-CM | POA: Diagnosis present

## 2019-10-12 DIAGNOSIS — I252 Old myocardial infarction: Secondary | ICD-10-CM

## 2019-10-12 DIAGNOSIS — I429 Cardiomyopathy, unspecified: Secondary | ICD-10-CM

## 2019-10-12 DIAGNOSIS — L0390 Cellulitis, unspecified: Secondary | ICD-10-CM | POA: Diagnosis present

## 2019-10-12 DIAGNOSIS — I130 Hypertensive heart and chronic kidney disease with heart failure and stage 1 through stage 4 chronic kidney disease, or unspecified chronic kidney disease: Secondary | ICD-10-CM | POA: Diagnosis not present

## 2019-10-12 DIAGNOSIS — Z79890 Hormone replacement therapy (postmenopausal): Secondary | ICD-10-CM

## 2019-10-12 DIAGNOSIS — M1711 Unilateral primary osteoarthritis, right knee: Secondary | ICD-10-CM | POA: Diagnosis present

## 2019-10-12 DIAGNOSIS — Z8249 Family history of ischemic heart disease and other diseases of the circulatory system: Secondary | ICD-10-CM

## 2019-10-12 DIAGNOSIS — Z87898 Personal history of other specified conditions: Secondary | ICD-10-CM

## 2019-10-12 DIAGNOSIS — Z9181 History of falling: Secondary | ICD-10-CM

## 2019-10-12 DIAGNOSIS — Z7901 Long term (current) use of anticoagulants: Secondary | ICD-10-CM

## 2019-10-12 DIAGNOSIS — Z882 Allergy status to sulfonamides status: Secondary | ICD-10-CM

## 2019-10-12 DIAGNOSIS — I10 Essential (primary) hypertension: Secondary | ICD-10-CM | POA: Diagnosis present

## 2019-10-12 DIAGNOSIS — F419 Anxiety disorder, unspecified: Secondary | ICD-10-CM | POA: Diagnosis present

## 2019-10-12 DIAGNOSIS — J45909 Unspecified asthma, uncomplicated: Secondary | ICD-10-CM | POA: Diagnosis present

## 2019-10-12 DIAGNOSIS — Z7952 Long term (current) use of systemic steroids: Secondary | ICD-10-CM

## 2019-10-12 DIAGNOSIS — R2242 Localized swelling, mass and lump, left lower limb: Secondary | ICD-10-CM | POA: Diagnosis not present

## 2019-10-12 DIAGNOSIS — I2510 Atherosclerotic heart disease of native coronary artery without angina pectoris: Secondary | ICD-10-CM | POA: Diagnosis present

## 2019-10-12 DIAGNOSIS — Z7951 Long term (current) use of inhaled steroids: Secondary | ICD-10-CM

## 2019-10-12 DIAGNOSIS — L309 Dermatitis, unspecified: Secondary | ICD-10-CM | POA: Diagnosis present

## 2019-10-12 MED ORDER — VANCOMYCIN HCL 2000 MG/400ML IV SOLN
2000.0000 mg | Freq: Once | INTRAVENOUS | Status: AC
  Administered 2019-10-13: 2000 mg via INTRAVENOUS
  Filled 2019-10-12: qty 400

## 2019-10-12 NOTE — ED Provider Notes (Signed)
[facility] DEPT Provider Note   CSN: [id number] Arrival date & time: 10/12/19  1330     History Chief Complaint  Patient presents with  . 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  Patient sent to the ER by his primary care doctor for leg infection.  Patient reports that he injured the lateral aspect of his left lower leg on his tractor several days ago.  He received an IM injection of Rocephin 3 days ago and has been on doxycycline since.  Leg has progressively worsened since then.  He has not had any fever.  He denies pain.        Past Medical History:  Diagnosis Date  . Atrial premature beats   . BPH (benign prostatic hyperplasia)   . BPH (benign prostatic hypertrophy)   . Cataract, right eye   . Chronic low back pain   . Colon polyp   . Coronary artery disease   . DDD (degenerative disc disease), lumbar   . Dermatitis 11/16  . Elevated IgE level 06/30/2018  . Hyperlipidemia   . Insomnia   . Kidney cysts   . Memory loss 2015  . Non-ischemic cardiomyopathy ([place])   . NSTEMI (non-ST elevated myocardial infarction) ([place]) 08/25/2014  . Osteoarthritis of right knee   . Pituitary tumor 2011   macroadenoma w/ VF compromise-then gamma knife a few years leater for some recurrence  . Positive radioallergosorbent test (RAST) 06/30/2018  . Status post insertion of drug-eluting stent into left anterior descending (LAD) artery 08/28/15   + nuc study.   . Urinary retention   . Vitamin D deficiency 01/2008    Patient Active Problem List   Diagnosis Date Noted  . Other specified disorders of nose and nasal sinuses 09/04/2018  . Sleep disturbance 09/04/2018  . DVT (deep venous thrombosis) ([place]) 08/22/2018  . Pulmonary embolism ([place]) 08/22/2018  . CKD (chronic kidney disease) stage 3, GFR 30-59 ml/min 08/22/2018  . Anemia 08/22/2018  . BPH (benign prostatic hyperplasia) 08/22/2018  . Anxiety 08/22/2018  . HTN (hypertension) 08/22/2018   . HLD (hyperlipidemia) 08/22/2018  . Atopic asthma 08/22/2018  . VTE (venous thromboembolism) 08/21/2018  . Positive radioallergosorbent test (RAST) 06/30/2018  . Elevated IgE level 06/30/2018  . Hiatal hernia 06/27/2018  . Atypical angina ([place]) 08/08/2017  . RBBB, anterior fascicular block and incompl posterior fascicular block 08/08/2017  . Urinary retention due to benign prostatic hyperplasia 12/05/2016  . Overactive bladder 12/05/2016  . Acute adrenal insufficiency ([place]) 12/03/2016  . AKI (acute kidney injury) ([place])   . Right bundle branch block   . Orthostatic hypotension   . CAD (coronary artery disease)   . History of ST elevation myocardial infarction (STEMI)   . H/O: pituitary tumor   . Chronic bilateral low back pain without sciatica   . Hyponatremia   . Leukocytosis   . Falls 12/02/2016  . Diarrhea 12/02/2016  . Syncope 12/02/2016  . Nasal fracture 12/02/2016  . Abnormal stress test 07/29/2015  . Angina effort   . Abnormal nuclear stress test   . NSTEMI (non-ST elevated myocardial infarction) ([place]) 08/25/2014  . Basal cell carcinoma 07/15/2014  . Open wound of nose with complication 123456  . Disease of upper respiratory system 07/15/2014  . Chronic infection of sinus 07/15/2014  . Dyssomnia 07/15/2014  . Dermatologic disease 04/05/2014  . CA of skin 04/05/2014  . Pituitary macroadenoma with extrasellar extension ([place]) 01/13/2014  . 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 Pituitary adenoma ([place]) 01/13/2014  . Mild cognitive disorder 01/13/2014  . PAC (premature atrial contraction) 12/10/2013  . APC (atrial premature contractions) 12/10/2013  . Lesion of pituitary gland ([place]) 08/30/2011  . HYPERCHOLESTEROLEMIA  IIA 02/10/2009  . CAD, NATIVE VESSEL 02/10/2009  . Cardiomyopathy ([place]) 02/10/2009  . BRADYCARDIA 02/10/2009  . Cardiac conduction disorder 02/10/2009  . CAD in native artery 02/10/2009    Past Surgical History:    Procedure Laterality Date  . ARTHROSCOPIC SURGERY    . BACK SURGERY    . CARDIAC CATHETERIZATION  07/27/2015   Dr Aundra Dubin; oLAD 95%, mLAD 50%, CFX stent OK, OM1 60%, OM2 99%, pPLOM 50%, RCA irregular, AM2 70%, EF 55-60%, no regional wall motion abnormalities.     Marland Kitchen CARDIAC CATHETERIZATION N/A 07/28/2015   Procedure: Left Heart Cath and Coronary Angiography;  Surgeon: [name], MD;  Location: [facility];  Service: Cardiovascular;  Laterality: N/A;  . CARDIAC CATHETERIZATION N/A 07/28/2015   Procedure: Coronary Stent Intervention;  Surgeon: Troy Sine, MD;  Location: [facility];  Service: Cardiovascular;  Laterality: N/A;  . CATARACT EXTRACTION    . CORONARY ANGIOPLASTY WITH STENT PLACEMENT  07/27/2015   XIENCE ALPINE RX 3.0X33 DES to the LAD, 95%>>0  . HAMMER  BUNION TOE SURGERY    . HAND SURGERY Left   . LEFT HEART CATHETERIZATION WITH CORONARY ANGIOGRAM N/A 08/25/2014   Procedure: LEFT HEART CATHETERIZATION WITH CORONARY ANGIOGRAM;  Surgeon: Burnell Blanks, MD;  Location: [facility] CATH LAB;  Service: Cardiovascular;  Laterality: N/A;  . PITUITARY SURGERY     12/01/09 Dr. Wilburn Cornelia and [name].  Marland Kitchen RIGHT/LEFT HEART CATH AND CORONARY ANGIOGRAPHY N/A 03/19/2018   Procedure: RIGHT/LEFT HEART CATH AND CORONARY ANGIOGRAPHY;  Surgeon: [name], MD;  Location: [facility];  Service: Cardiovascular;  Laterality: N/A;  . RIGHT/LEFT HEART CATH AND CORONARY ANGIOGRAPHY N/A 03/13/2019   Procedure: RIGHT/LEFT HEART CATH AND CORONARY ANGIOGRAPHY;  Surgeon: [name], MD;  Location: [facility];  Service: Cardiovascular;  Laterality: N/A;       Family History  Problem Relation Age of Onset  . Congestive Heart Failure Father 46  . Other Mother 57  . Coronary artery disease Brother   . Other Sister 29       polio  . Heart attack Brother        MI/ASCAD 2006  . COPD Sister 30    Social History   Tobacco Use  . Smoking status: Never  Smoker  . Smokeless tobacco: Never Used  Substance Use Topics  . Alcohol use: Not Currently  . Drug use: No    Home Medications Prior to Admission medications   Medication Sig Start Date End Date Taking? Authorizing Provider  albuterol (VENTOLIN HFA) 108 (90 Base) MCG/ACT inhaler Inhale 2 puffs into the lungs every 6 (six) hours as needed for wheezing or shortness of breath. 02/12/19   Icard, Octavio Graves, DO  apixaban (ELIQUIS) 2.5 MG TABS tablet Take 1 tablet (2.5 mg total) by mouth 2 (two) times daily. 02/19/19   [name], MD  arformoterol (BROVANA) 15 MCG/2ML NEBU Take 2 mLs (15 mcg total) by nebulization 2 (two) times daily. 06/03/19 07/03/19  Garner Nash, DO  aspirin EC 81 MG tablet Take 1 tablet (81 mg total) by mouth daily. 02/19/19   [name], MD  budesonide (PULMICORT) 0.5 MG/2ML nebulizer solution Take  2 mLs (0.5 mg total) by nebulization 2 (two) times daily. 06/03/19 07/03/19  Garner Nash, DO  budesonide-formoterol (SYMBICORT) 160-4.5 MCG/ACT inhaler Inhale 2 puffs into the lungs 2 (two) times daily. 06/26/19   Icard, Octavio Graves, DO  budesonide-formoterol (SYMBICORT) 160-4.5 MCG/ACT inhaler Inhale 2 puffs into the lungs every 12 (twelve) hours. 07/29/19   Icard, Octavio Graves, DO  budesonide-formoterol (SYMBICORT) 160-4.5 MCG/ACT inhaler Inhale 2 puffs into the lungs 2 (two) times daily. 07/29/19   Icard, Octavio Graves, DO  buPROPion (WELLBUTRIN XL) 150 MG 24 hr tablet Take 1 tablet by mouth daily. 01/26/19   [provider]  BYSTOLIC 2.5 MG tablet TAKE 1/2 TABLET(1.25 MG) BY MOUTH DAILY 06/02/19   [name], MD  cabergoline (DOSTINEX) 0.5 MG tablet Take 0.5 mg by mouth once a week. 02/16/19   [provider]  carvedilol (COREG) 12.5 MG tablet Take 1 tablet (12.5 mg total) by mouth 2 (two) times daily with a meal. 07/08/19   [name], MD  doxycycline (VIBRA-TABS) 100 MG tablet Take 100 mg by mouth 2 (two) times daily. 10/08/19   [provider]   dupilumab (DUPIXENT) 200 MG/1.14ML prefilled syringe Inject 200 mg into the skin every 14 (fourteen) days. 07/03/19   Icard, Bradley L, DO  dupilumab (DUPIXENT) 200 MG/1.14ML prefilled syringe Inject 200 mg into the skin every 14 (fourteen) days. 08/13/19   Icard, Octavio Graves, DO  Evolocumab (REPATHA) 140 MG/ML SOSY Inject into the skin daily.    [provider]  ezetimibe (ZETIA) 10 MG tablet TAKE 1 TABLET(10 MG) BY MOUTH DAILY 04/01/19   [name], MD  fluticasone (FLONASE SENSIMIST) 27.5 MCG/SPRAY nasal spray Place 2 sprays into the nose daily.    [provider]  galantamine (RAZADYNE ER) 8 MG 24 hr capsule Take 8 mg by mouth every morning. 07/14/19   [provider]  hydrocortisone (CORTEF) 20 MG tablet Take 20 mg by mouth 2 (two) times daily.    [provider]  Icosapent Ethyl 1 g CAPS Take 2 capsules (2 g total) by mouth 2 (two) times a day. 04/01/19   Hilty, Nadean Corwin, MD  ipratropium-albuterol (DUONEB) 0.5-2.5 (3) MG/3ML SOLN Take 3 mLs by nebulization every 4 (four) hours as needed (Take 38ml every 4 hours and prn for wheezing and shortness of breath). 06/03/19 07/03/19  Garner Nash, DO  levothyroxine (SYNTHROID, LEVOTHROID) 150 MCG tablet Take 150 mcg by mouth at bedtime.     [provider]  loratadine (CLARITIN) 10 MG tablet Take 10 mg by mouth daily.    [provider]  memantine (NAMENDA) 5 MG tablet  09/22/19   [provider]  montelukast (SINGULAIR) 10 MG tablet Take 1 tablet (10 mg total) by mouth at bedtime. 07/01/18   Icard, Bradley L, DO  MYRBETRIQ 50 MG TB24 tablet Take 50 mg by mouth at bedtime.  11/22/16   [provider]  nitroGLYCERIN (NITROSTAT) 0.4 MG SL tablet Place 1 tablet (0.4 mg total) under the tongue every 5 (five) minutes as needed for chest pain. 03/10/18   [name], MD  omeprazole (PRILOSEC) 40 MG capsule Take 40 mg by mouth daily.    [provider]  potassium chloride  (KLOR-CON) 10 MEQ tablet Take 2 tablets (20 mEq total) by mouth daily. 07/31/19   [name], MD  ranolazine (RANEXA) 500 MG 12 hr tablet Take 1 tablet (500 mg total) by mouth 2 (two) times daily. 03/13/19  [name], MD  Red Yeast Rice Extract (RED YEAST RICE PO) Take by mouth daily.    [provider]  testosterone cypionate (DEPOTESTOTERONE CYPIONATE) 200 MG/ML injection Inject into the muscle every 21 ( twenty-one) days. 1.62 % injection    [provider]  torsemide (DEMADEX) 10 MG tablet Take 1 tablet (10 mg total) by mouth daily. 09/08/19   Hilty, Nadean Corwin, MD  triamcinolone cream (KENALOG) 0.1 % Apply 1 application topically 2 (two) times daily.    [provider]    Allergies    Sulfa antibiotics, Sulfamethoxazole, Sulfonamide derivatives, Bee venom, Flonase [fluticasone], and Tamsulosin  Review of Systems   Review of Systems  Skin: Positive for color change and wound.  All other systems reviewed and are negative.   Physical Exam Updated Vital Signs BP (!) 151/99   Pulse 62   Temp 98.6 F (37 C) (Oral)   Resp 12   Ht 6\' 4"  (1.93 m)   Wt 108.9 kg   SpO2 99%   BMI 29.21 kg/m   Physical Exam Vitals and nursing note reviewed.  Constitutional:      General: He is not in acute distress.    Appearance: Normal appearance. He is well-developed.  HENT:     Head: Normocephalic and atraumatic.     Right Ear: Hearing normal.     Left Ear: Hearing normal.     Nose: Nose normal.  Eyes:     Conjunctiva/sclera: Conjunctivae normal.     Pupils: Pupils are equal, round, and reactive to light.  Cardiovascular:     Rate and Rhythm: Regular rhythm.     Heart sounds: S1 normal and S2 normal. No murmur. No friction rub. No gallop.   Pulmonary:     Effort: Pulmonary effort is normal. No respiratory distress.     Breath sounds: Normal breath sounds.  Chest:     Chest wall: No tenderness.  Abdominal:     General: Bowel sounds are normal.      Palpations: Abdomen is soft.     Tenderness: There is no abdominal tenderness. There is no guarding or rebound. Negative signs include Murphy's sign and McBurney's sign.     Hernia: No hernia is present.  Musculoskeletal:        General: Normal range of motion.     Cervical back: Normal range of motion and neck supple.     Right lower leg: Edema present.     Left lower leg: Edema present.  Skin:    General: Skin is warm and dry.     Findings: Erythema (Left lower leg) present. No rash.     Comments: Superficial abrasion left lateral lower leg  Neurological:     Mental Status: He is alert and oriented to person, place, and time.     GCS: GCS eye subscore is 4. GCS verbal subscore is 5. GCS motor subscore is 6.     Cranial Nerves: No cranial nerve deficit.     Sensory: No sensory deficit.     Coordination: Coordination normal.  Psychiatric:        Speech: Speech normal.        Behavior: Behavior normal.        Thought Content: Thought content normal.         ED Results / Procedures / Treatments   Labs (all labs ordered are listed, but only abnormal results are displayed) Labs Reviewed  SARS CORONAVIRUS 2 (TAT 6-24 HRS)  CBC WITH  DIFFERENTIAL/PLATELET  BASIC METABOLIC PANEL  LACTIC ACID, PLASMA    EKG None  Radiology No results found.  Procedures Procedures (including critical care time)  Medications Ordered in ED Medications - No data to display  ED Course  I have reviewed the triage vital signs and the nursing notes.  Pertinent labs & imaging results that were available during my care of the patient were reviewed by me and considered in my medical decision making (see chart for details).    MDM Rules/Calculators/A&P                      Patient presents to the emergency department for evaluation of cellulitis.  Patient has been treated as an outpatient by primary care doctor with reasonable antibiotic coverage but has not improved.  No signs of sepsis.  Will  require hospitalization for IV antibiotic therapy.   Final Clinical Impression(s) / ED Diagnoses Final diagnoses:  Cellulitis of left lower extremity    Rx / DC Orders ED Discharge Orders    None       Orpah Greek, MD 10/12/19 2321

## 2019-10-12 NOTE — ED Triage Notes (Signed)
Patient has a left lower leg and foot, possible cellulitis, that has been treated with Doxycycline and a Rocephin injection 3 days ago. He states he has been referred for further treatment since the two antibiotics are not covering the source of infection. Patient is ambulatory with his cane.

## 2019-10-13 ENCOUNTER — Observation Stay (HOSPITAL_COMMUNITY): Payer: Medicare Other

## 2019-10-13 ENCOUNTER — Encounter (HOSPITAL_COMMUNITY): Payer: Self-pay | Admitting: Family Medicine

## 2019-10-13 ENCOUNTER — Other Ambulatory Visit: Payer: Self-pay

## 2019-10-13 DIAGNOSIS — R2242 Localized swelling, mass and lump, left lower limb: Secondary | ICD-10-CM | POA: Diagnosis not present

## 2019-10-13 DIAGNOSIS — N183 Chronic kidney disease, stage 3 unspecified: Secondary | ICD-10-CM | POA: Diagnosis present

## 2019-10-13 DIAGNOSIS — I2510 Atherosclerotic heart disease of native coronary artery without angina pectoris: Secondary | ICD-10-CM | POA: Diagnosis not present

## 2019-10-13 DIAGNOSIS — Z955 Presence of coronary angioplasty implant and graft: Secondary | ICD-10-CM | POA: Diagnosis not present

## 2019-10-13 DIAGNOSIS — Z86718 Personal history of other venous thrombosis and embolism: Secondary | ICD-10-CM | POA: Diagnosis not present

## 2019-10-13 DIAGNOSIS — L03116 Cellulitis of left lower limb: Secondary | ICD-10-CM | POA: Diagnosis present

## 2019-10-13 DIAGNOSIS — N1831 Chronic kidney disease, stage 3a: Secondary | ICD-10-CM

## 2019-10-13 DIAGNOSIS — I2694 Multiple subsegmental pulmonary emboli without acute cor pulmonale: Secondary | ICD-10-CM | POA: Diagnosis not present

## 2019-10-13 DIAGNOSIS — I5032 Chronic diastolic (congestive) heart failure: Secondary | ICD-10-CM | POA: Diagnosis present

## 2019-10-13 DIAGNOSIS — I428 Other cardiomyopathies: Secondary | ICD-10-CM | POA: Diagnosis present

## 2019-10-13 DIAGNOSIS — Z86010 Personal history of colonic polyps: Secondary | ICD-10-CM | POA: Diagnosis not present

## 2019-10-13 DIAGNOSIS — G4700 Insomnia, unspecified: Secondary | ICD-10-CM | POA: Diagnosis present

## 2019-10-13 DIAGNOSIS — H269 Unspecified cataract: Secondary | ICD-10-CM | POA: Diagnosis present

## 2019-10-13 DIAGNOSIS — I10 Essential (primary) hypertension: Secondary | ICD-10-CM

## 2019-10-13 DIAGNOSIS — Z86711 Personal history of pulmonary embolism: Secondary | ICD-10-CM | POA: Diagnosis not present

## 2019-10-13 DIAGNOSIS — I130 Hypertensive heart and chronic kidney disease with heart failure and stage 1 through stage 4 chronic kidney disease, or unspecified chronic kidney disease: Secondary | ICD-10-CM | POA: Diagnosis present

## 2019-10-13 DIAGNOSIS — I252 Old myocardial infarction: Secondary | ICD-10-CM | POA: Diagnosis not present

## 2019-10-13 DIAGNOSIS — L0390 Cellulitis, unspecified: Secondary | ICD-10-CM | POA: Diagnosis present

## 2019-10-13 DIAGNOSIS — E785 Hyperlipidemia, unspecified: Secondary | ICD-10-CM | POA: Diagnosis present

## 2019-10-13 DIAGNOSIS — M1711 Unilateral primary osteoarthritis, right knee: Secondary | ICD-10-CM | POA: Diagnosis present

## 2019-10-13 DIAGNOSIS — M5136 Other intervertebral disc degeneration, lumbar region: Secondary | ICD-10-CM | POA: Diagnosis present

## 2019-10-13 DIAGNOSIS — J45909 Unspecified asthma, uncomplicated: Secondary | ICD-10-CM | POA: Diagnosis present

## 2019-10-13 DIAGNOSIS — L309 Dermatitis, unspecified: Secondary | ICD-10-CM | POA: Diagnosis present

## 2019-10-13 DIAGNOSIS — H9190 Unspecified hearing loss, unspecified ear: Secondary | ICD-10-CM | POA: Diagnosis present

## 2019-10-13 DIAGNOSIS — I429 Cardiomyopathy, unspecified: Secondary | ICD-10-CM

## 2019-10-13 DIAGNOSIS — U071 COVID-19: Secondary | ICD-10-CM | POA: Diagnosis present

## 2019-10-13 DIAGNOSIS — N400 Enlarged prostate without lower urinary tract symptoms: Secondary | ICD-10-CM | POA: Diagnosis present

## 2019-10-13 DIAGNOSIS — E2740 Unspecified adrenocortical insufficiency: Secondary | ICD-10-CM | POA: Diagnosis present

## 2019-10-13 DIAGNOSIS — Z85828 Personal history of other malignant neoplasm of skin: Secondary | ICD-10-CM | POA: Diagnosis not present

## 2019-10-13 DIAGNOSIS — F419 Anxiety disorder, unspecified: Secondary | ICD-10-CM | POA: Diagnosis present

## 2019-10-13 LAB — CBC WITH DIFFERENTIAL/PLATELET
Abs Immature Granulocytes: 0.11 10*3/uL — ABNORMAL HIGH (ref 0.00–0.07)
Abs Immature Granulocytes: 0.13 10*3/uL — ABNORMAL HIGH (ref 0.00–0.07)
Abs Immature Granulocytes: 0.14 10*3/uL — ABNORMAL HIGH (ref 0.00–0.07)
Basophils Absolute: 0.1 10*3/uL (ref 0.0–0.1)
Basophils Absolute: 0.1 10*3/uL (ref 0.0–0.1)
Basophils Absolute: 0.1 10*3/uL (ref 0.0–0.1)
Basophils Relative: 1 %
Basophils Relative: 1 %
Basophils Relative: 1 %
Eosinophils Absolute: 0.3 10*3/uL (ref 0.0–0.5)
Eosinophils Absolute: 0.3 10*3/uL (ref 0.0–0.5)
Eosinophils Absolute: 0.2 10*3/uL (ref 0.0–0.5)
Eosinophils Relative: 3 %
Eosinophils Relative: 3 %
Eosinophils Relative: 2 %
HCT: 42 % (ref 39.0–52.0)
HCT: 44.4 % (ref 39.0–52.0)
HCT: 44 % (ref 39.0–52.0)
Hemoglobin: 13.9 g/dL (ref 13.0–17.0)
Hemoglobin: 14.6 g/dL (ref 13.0–17.0)
Hemoglobin: 14.6 g/dL (ref 13.0–17.0)
Immature Granulocytes: 1 %
Immature Granulocytes: 1 %
Immature Granulocytes: 1 %
Lymphocytes Relative: 18 %
Lymphocytes Relative: 11 %
Lymphocytes Relative: 11 %
Lymphs Abs: 1.9 10*3/uL (ref 0.7–4.0)
Lymphs Abs: 1.1 10*3/uL (ref 0.7–4.0)
Lymphs Abs: 1.1 10*3/uL (ref 0.7–4.0)
MCH: 32.9 pg (ref 26.0–34.0)
MCH: 32.7 pg (ref 26.0–34.0)
MCH: 32.9 pg (ref 26.0–34.0)
MCHC: 33.1 g/dL (ref 30.0–36.0)
MCHC: 32.9 g/dL (ref 30.0–36.0)
MCHC: 33.2 g/dL (ref 30.0–36.0)
MCV: 99.5 fL (ref 80.0–100.0)
MCV: 99.3 fL (ref 80.0–100.0)
MCV: 99.1 fL (ref 80.0–100.0)
Monocytes Absolute: 0.7 10*3/uL (ref 0.1–1.0)
Monocytes Absolute: 0.6 10*3/uL (ref 0.1–1.0)
Monocytes Absolute: 0.5 10*3/uL (ref 0.1–1.0)
Monocytes Relative: 6 %
Monocytes Relative: 6 %
Monocytes Relative: 5 %
Neutro Abs: 7.4 10*3/uL (ref 1.7–7.7)
Neutro Abs: 8.2 10*3/uL — ABNORMAL HIGH (ref 1.7–7.7)
Neutro Abs: 8.4 10*3/uL — ABNORMAL HIGH (ref 1.7–7.7)
Neutrophils Relative %: 71 %
Neutrophils Relative %: 78 %
Neutrophils Relative %: 80 %
Platelets: 296 10*3/uL (ref 150–400)
Platelets: 298 10*3/uL (ref 150–400)
Platelets: 314 10*3/uL (ref 150–400)
RBC: 4.22 MIL/uL (ref 4.22–5.81)
RBC: 4.47 MIL/uL (ref 4.22–5.81)
RBC: 4.44 MIL/uL (ref 4.22–5.81)
RDW: 14.6 % (ref 11.5–15.5)
RDW: 14.6 % (ref 11.5–15.5)
RDW: 14.5 % (ref 11.5–15.5)
WBC: 10.5 10*3/uL (ref 4.0–10.5)
WBC: 10.4 10*3/uL (ref 4.0–10.5)
WBC: 10.5 10*3/uL (ref 4.0–10.5)
nRBC: 0 % (ref 0.0–0.2)
nRBC: 0 % (ref 0.0–0.2)
nRBC: 0 % (ref 0.0–0.2)

## 2019-10-13 LAB — TROPONIN I (HIGH SENSITIVITY)
Troponin I (High Sensitivity): 8 ng/L (ref <18)
Troponin I (High Sensitivity): 8 ng/L (ref <18)

## 2019-10-13 LAB — COMPREHENSIVE METABOLIC PANEL
ALT: 32 U/L (ref 0–44)
AST: 29 U/L (ref 15–41)
Albumin: 3.7 g/dL (ref 3.5–5.0)
Alkaline Phosphatase: 86 U/L (ref 38–126)
Anion gap: 12 (ref 5–15)
BUN: 31 mg/dL — ABNORMAL HIGH (ref 8–23)
CO2: 23 mmol/L (ref 22–32)
Calcium: 8.9 mg/dL (ref 8.9–10.3)
Chloride: 106 mmol/L (ref 98–111)
Creatinine, Ser: 1.45 mg/dL — ABNORMAL HIGH (ref 0.61–1.24)
GFR calc Af Amer: 51 mL/min — ABNORMAL LOW (ref >60)
GFR calc non Af Amer: 44 mL/min — ABNORMAL LOW (ref >60)
Glucose, Bld: 111 mg/dL — ABNORMAL HIGH (ref 70–99)
Potassium: 4.3 mmol/L (ref 3.5–5.1)
Sodium: 141 mmol/L (ref 135–145)
Total Bilirubin: 0.9 mg/dL (ref 0.3–1.2)
Total Protein: 7.4 g/dL (ref 6.5–8.1)

## 2019-10-13 LAB — ABO/RH: ABO/RH(D): A NEG

## 2019-10-13 LAB — BRAIN NATRIURETIC PEPTIDE: B Natriuretic Peptide: 222.4 pg/mL — ABNORMAL HIGH (ref 0.0–100.0)

## 2019-10-13 LAB — BASIC METABOLIC PANEL
Anion gap: 11 (ref 5–15)
Anion gap: 10 (ref 5–15)
BUN: 31 mg/dL — ABNORMAL HIGH (ref 8–23)
BUN: 32 mg/dL — ABNORMAL HIGH (ref 8–23)
CO2: 25 mmol/L (ref 22–32)
CO2: 25 mmol/L (ref 22–32)
Calcium: 9 mg/dL (ref 8.9–10.3)
Calcium: 8.9 mg/dL (ref 8.9–10.3)
Chloride: 106 mmol/L (ref 98–111)
Chloride: 104 mmol/L (ref 98–111)
Creatinine, Ser: 1.62 mg/dL — ABNORMAL HIGH (ref 0.61–1.24)
Creatinine, Ser: 1.5 mg/dL — ABNORMAL HIGH (ref 0.61–1.24)
GFR calc Af Amer: 45 mL/min — ABNORMAL LOW (ref >60)
GFR calc Af Amer: 49 mL/min — ABNORMAL LOW (ref >60)
GFR calc non Af Amer: 39 mL/min — ABNORMAL LOW (ref >60)
GFR calc non Af Amer: 42 mL/min — ABNORMAL LOW (ref >60)
Glucose, Bld: 96 mg/dL (ref 70–99)
Glucose, Bld: 105 mg/dL — ABNORMAL HIGH (ref 70–99)
Potassium: 5.2 mmol/L — ABNORMAL HIGH (ref 3.5–5.1)
Potassium: 4.2 mmol/L (ref 3.5–5.1)
Sodium: 142 mmol/L (ref 135–145)
Sodium: 139 mmol/L (ref 135–145)

## 2019-10-13 LAB — PROCALCITONIN: Procalcitonin: 0.81 ng/mL

## 2019-10-13 LAB — FERRITIN: Ferritin: 212 ng/mL (ref 24–336)

## 2019-10-13 LAB — D-DIMER, QUANTITATIVE: D-Dimer, Quant: 0.87 ug/mL-FEU — ABNORMAL HIGH (ref 0.00–0.50)

## 2019-10-13 LAB — C-REACTIVE PROTEIN: CRP: 9.7 mg/dL — ABNORMAL HIGH (ref <1.0)

## 2019-10-13 LAB — LACTIC ACID, PLASMA: Lactic Acid, Venous: 1.1 mmol/L (ref 0.5–1.9)

## 2019-10-13 LAB — SARS CORONAVIRUS 2 (TAT 6-24 HRS): SARS Coronavirus 2: POSITIVE — AB

## 2019-10-13 MED ORDER — ACETAMINOPHEN 325 MG PO TABS: 650.0000 mg | ORAL_TABLET | Freq: Four times a day (QID) | ORAL | Status: DC | PRN

## 2019-10-13 MED ORDER — SENNOSIDES-DOCUSATE SODIUM 8.6-50 MG PO TABS: 1.0000 | ORAL_TABLET | Freq: Every evening | ORAL | Status: DC | PRN

## 2019-10-13 MED ORDER — SODIUM CHLORIDE 0.9% FLUSH
3.0000 mL | Freq: Two times a day (BID) | INTRAVENOUS | Status: DC
  Administered 2019-10-13 – 2019-10-15 (×4): 3 mL via INTRAVENOUS

## 2019-10-13 MED ORDER — ALBUTEROL SULFATE (2.5 MG/3ML) 0.083% IN NEBU: 2.5000 mg | INHALATION_SOLUTION | Freq: Four times a day (QID) | RESPIRATORY_TRACT | Status: DC | PRN

## 2019-10-13 MED ORDER — ACETAMINOPHEN 325 MG PO TABS
650.0000 mg | ORAL_TABLET | Freq: Four times a day (QID) | ORAL | Status: DC | PRN
  Administered 2019-10-14: 650 mg via ORAL
  Filled 2019-10-13: qty 2

## 2019-10-13 MED ORDER — HEPARIN SODIUM (PORCINE) 5000 UNIT/ML IJ SOLN: 5000.0000 [IU] | Freq: Three times a day (TID) | INTRAMUSCULAR | Status: DC

## 2019-10-13 MED ORDER — ONDANSETRON HCL 4 MG/2ML IJ SOLN: 4.0000 mg | Freq: Four times a day (QID) | INTRAMUSCULAR | Status: DC | PRN

## 2019-10-13 MED ORDER — PNEUMOCOCCAL VAC POLYVALENT 25 MCG/0.5ML IJ INJ
0.5000 mL | INJECTION | INTRAMUSCULAR | Status: DC
  Filled 2019-10-13: qty 0.5

## 2019-10-13 MED ORDER — VANCOMYCIN HCL 1500 MG/300ML IV SOLN
1500.0000 mg | INTRAVENOUS | Status: DC
  Administered 2019-10-13 – 2019-10-14 (×2): 1500 mg via INTRAVENOUS
  Filled 2019-10-13 (×2): qty 300

## 2019-10-13 MED ORDER — APIXABAN 2.5 MG PO TABS
2.5000 mg | ORAL_TABLET | Freq: Once | ORAL | Status: AC
  Administered 2019-10-13: 2.5 mg via ORAL
  Filled 2019-10-13: qty 1

## 2019-10-13 MED ORDER — ONDANSETRON HCL 4 MG PO TABS: 4.0000 mg | ORAL_TABLET | Freq: Four times a day (QID) | ORAL | Status: DC | PRN

## 2019-10-13 MED ORDER — HYDROCORTISONE 20 MG PO TABS
20.0000 mg | ORAL_TABLET | Freq: Every day | ORAL | Status: DC
  Administered 2019-10-13 – 2019-10-15 (×3): 20 mg via ORAL
  Filled 2019-10-13 (×3): qty 1

## 2019-10-13 MED ORDER — MEMANTINE HCL 10 MG PO TABS
5.0000 mg | ORAL_TABLET | Freq: Two times a day (BID) | ORAL | Status: DC
  Administered 2019-10-13 – 2019-10-15 (×5): 5 mg via ORAL
  Filled 2019-10-13 (×6): qty 1

## 2019-10-13 MED ORDER — BUPROPION HCL ER (XL) 150 MG PO TB24
150.0000 mg | ORAL_TABLET | Freq: Every day | ORAL | Status: DC
  Administered 2019-10-13 – 2019-10-15 (×3): 150 mg via ORAL
  Filled 2019-10-13 (×3): qty 1

## 2019-10-13 MED ORDER — ASPIRIN EC 81 MG PO TBEC
81.0000 mg | DELAYED_RELEASE_TABLET | Freq: Every day | ORAL | Status: DC
  Administered 2019-10-13 – 2019-10-15 (×3): 81 mg via ORAL
  Filled 2019-10-13 (×2): qty 1

## 2019-10-13 MED ORDER — EZETIMIBE 10 MG PO TABS
10.0000 mg | ORAL_TABLET | Freq: Every day | ORAL | Status: DC
  Administered 2019-10-13 – 2019-10-15 (×3): 10 mg via ORAL
  Filled 2019-10-13 (×3): qty 1

## 2019-10-13 MED ORDER — SODIUM CHLORIDE 0.9% FLUSH: 3.0000 mL | INTRAVENOUS | Status: DC | PRN

## 2019-10-13 MED ORDER — HYDROCORTISONE 20 MG PO TABS
40.0000 mg | ORAL_TABLET | Freq: Every day | ORAL | Status: DC
  Administered 2019-10-13 – 2019-10-14 (×2): 40 mg via ORAL
  Filled 2019-10-13 (×3): qty 2

## 2019-10-13 MED ORDER — SODIUM CHLORIDE 0.9 % IV SOLN
1.0000 g | INTRAVENOUS | Status: DC
  Administered 2019-10-13 – 2019-10-14 (×2): 1 g via INTRAVENOUS
  Filled 2019-10-13: qty 1
  Filled 2019-10-13 (×2): qty 10

## 2019-10-13 MED ORDER — GALANTAMINE HYDROBROMIDE ER 8 MG PO CP24
8.0000 mg | ORAL_CAPSULE | Freq: Every morning | ORAL | Status: DC
  Administered 2019-10-13 – 2019-10-15 (×3): 8 mg via ORAL
  Filled 2019-10-13 (×3): qty 1

## 2019-10-13 MED ORDER — APIXABAN 2.5 MG PO TABS
2.5000 mg | ORAL_TABLET | Freq: Two times a day (BID) | ORAL | Status: DC
  Administered 2019-10-13 – 2019-10-15 (×5): 2.5 mg via ORAL
  Filled 2019-10-13 (×5): qty 1

## 2019-10-13 MED ORDER — HYDROCOD POLST-CPM POLST ER 10-8 MG/5ML PO SUER: 5.0000 mL | Freq: Two times a day (BID) | ORAL | Status: DC | PRN

## 2019-10-13 MED ORDER — MOMETASONE FURO-FORMOTEROL FUM 200-5 MCG/ACT IN AERO
2.0000 | INHALATION_SPRAY | Freq: Two times a day (BID) | RESPIRATORY_TRACT | Status: DC
  Administered 2019-10-13 – 2019-10-15 (×5): 2 via RESPIRATORY_TRACT
  Filled 2019-10-13: qty 8.8

## 2019-10-13 MED ORDER — ACETAMINOPHEN 650 MG RE SUPP: 650.0000 mg | Freq: Four times a day (QID) | RECTAL | Status: DC | PRN

## 2019-10-13 MED ORDER — FLUTICASONE PROPIONATE 50 MCG/ACT NA SUSP
2.0000 | Freq: Every day | NASAL | Status: DC
  Administered 2019-10-13 – 2019-10-15 (×3): 2 via NASAL
  Filled 2019-10-13: qty 16

## 2019-10-13 MED ORDER — RANOLAZINE ER 500 MG PO TB12
500.0000 mg | ORAL_TABLET | Freq: Two times a day (BID) | ORAL | Status: DC
  Administered 2019-10-13 – 2019-10-15 (×5): 500 mg via ORAL
  Filled 2019-10-13 (×6): qty 1

## 2019-10-13 MED ORDER — GUAIFENESIN-DM 100-10 MG/5ML PO SYRP: 10.0000 mL | ORAL_SOLUTION | ORAL | Status: DC | PRN

## 2019-10-13 MED ORDER — SODIUM CHLORIDE 0.9 % IV SOLN: 250.0000 mL | INTRAVENOUS | Status: DC | PRN

## 2019-10-13 MED ORDER — NEBIVOLOL HCL 2.5 MG PO TABS
1.2500 mg | ORAL_TABLET | Freq: Every day | ORAL | Status: DC
  Administered 2019-10-13 – 2019-10-15 (×3): 1.25 mg via ORAL
  Filled 2019-10-13 (×3): qty 1

## 2019-10-13 MED ORDER — TORSEMIDE 10 MG PO TABS
10.0000 mg | ORAL_TABLET | Freq: Every day | ORAL | Status: DC
  Administered 2019-10-13 – 2019-10-15 (×3): 10 mg via ORAL
  Filled 2019-10-13 (×3): qty 1

## 2019-10-13 MED ORDER — LORATADINE 10 MG PO TABS
10.0000 mg | ORAL_TABLET | Freq: Every day | ORAL | Status: DC
  Administered 2019-10-13 – 2019-10-15 (×3): 10 mg via ORAL
  Filled 2019-10-13 (×3): qty 1

## 2019-10-13 MED ORDER — IPRATROPIUM-ALBUTEROL 20-100 MCG/ACT IN AERS
1.0000 | INHALATION_SPRAY | Freq: Four times a day (QID) | RESPIRATORY_TRACT | Status: DC
  Administered 2019-10-13 (×2): 1 via RESPIRATORY_TRACT
  Filled 2019-10-13: qty 4

## 2019-10-13 MED ORDER — PANTOPRAZOLE SODIUM 40 MG PO TBEC
80.0000 mg | DELAYED_RELEASE_TABLET | Freq: Every day | ORAL | Status: DC
  Administered 2019-10-13 – 2019-10-15 (×3): 80 mg via ORAL
  Filled 2019-10-13 (×2): qty 2

## 2019-10-13 MED ORDER — MIRABEGRON ER 25 MG PO TB24
50.0000 mg | ORAL_TABLET | Freq: Every day | ORAL | Status: DC
  Administered 2019-10-13 – 2019-10-14 (×2): 50 mg via ORAL
  Filled 2019-10-13 (×3): qty 2

## 2019-10-13 MED ORDER — ZINC SULFATE 220 (50 ZN) MG PO CAPS
220.0000 mg | ORAL_CAPSULE | Freq: Every day | ORAL | Status: DC
  Administered 2019-10-13 – 2019-10-15 (×3): 220 mg via ORAL
  Filled 2019-10-13 (×2): qty 1

## 2019-10-13 MED ORDER — ZOLPIDEM TARTRATE 5 MG PO TABS
5.0000 mg | ORAL_TABLET | Freq: Once | ORAL | Status: AC
  Administered 2019-10-13: 5 mg via ORAL
  Filled 2019-10-13: qty 1

## 2019-10-13 MED ORDER — LEVOTHYROXINE SODIUM 75 MCG PO TABS
150.0000 ug | ORAL_TABLET | Freq: Every day | ORAL | Status: DC
  Administered 2019-10-14 – 2019-10-15 (×2): 150 ug via ORAL
  Filled 2019-10-13 (×2): qty 2

## 2019-10-13 MED ORDER — ASCORBIC ACID 500 MG PO TABS
500.0000 mg | ORAL_TABLET | Freq: Every day | ORAL | Status: DC
  Administered 2019-10-13 – 2019-10-15 (×3): 500 mg via ORAL
  Filled 2019-10-13 (×2): qty 1

## 2019-10-13 MED ORDER — IPRATROPIUM-ALBUTEROL 20-100 MCG/ACT IN AERS
1.0000 | INHALATION_SPRAY | Freq: Three times a day (TID) | RESPIRATORY_TRACT | Status: DC
  Administered 2019-10-14: 1 via RESPIRATORY_TRACT
  Filled 2019-10-13: qty 4

## 2019-10-13 MED ORDER — MONTELUKAST SODIUM 10 MG PO TABS
10.0000 mg | ORAL_TABLET | Freq: Every day | ORAL | Status: DC
  Administered 2019-10-13 – 2019-10-14 (×2): 10 mg via ORAL
  Filled 2019-10-13 (×3): qty 1

## 2019-10-13 MED ORDER — HYDROCORTISONE 20 MG PO TABS
40.0000 mg | ORAL_TABLET | Freq: Once | ORAL | Status: AC
  Administered 2019-10-13: 40 mg via ORAL
  Filled 2019-10-13: qty 2

## 2019-10-13 NOTE — ED Notes (Signed)
Pt has many questions regarding care. Pt requesting to speak with admitting provider. Admitting made aware. MD made aware that pt is very anxious.

## 2019-10-13 NOTE — Progress Notes (Signed)
   Follow Up Note  Please see full HPI done earlier this morning.  Briefly 86 year old male with medical history significant for CAD, CHF, CKD, DVT/PE on Eliquis, s/p pituitary tumor removal on hydrocortisone, possible dementia, presented to the ED c/o progressive swelling, erythema, tenderness of left lower extremity for the past few days.  Patient failed outpatient antibiotics (doxycycline, and a dose of Rocephin).  PCP recommended patient to go to the ED for further evaluation.  In the ED, vital signs stable, labs unremarkable except for creatinine which is at his baseline.  X-ray of lower left leg showed soft tissue swelling without gas or acute osseous abnormality.  Patient started on vancomycin.  Of note, patient Covid test came back positive, patient continues to remain asymptomatic, saturating well on room air, afebrile.    Today, patient noted to be confused, asking questions over and over again.  Asking when he can be discharged.  Patient denies any new complaints, shortness of breath, cough, chest pain, abdominal pain, nausea/vomiting, diarrhea.  Spoke to his wife, who noted that patient does get confused (at his baseline).   Exam: CV: S1, S2 present Lungs: CTAB Abd: Soft, nontender, nondistended, bowel sounds present Ext: LLE swelling, erythema, warmth, no significant drainage noted  Present on Admission: . Left leg cellulitis . Pulmonary embolism ([place]) . HTN (hypertension) . CKD (chronic kidney disease) stage 3, GFR 30-59 ml/min . CAD in native artery . Anxiety . Cellulitis   Plan  LLE cellulitis Currently afebrile, with no leukocytosis X-ray showed diffuse soft tissue swelling with no soft tissue gas or acute osseous abnormality Procalcitonin elevated Continue vancomycin, will add ceftriaxone, may escalate or de-escalate pending improvement  COVID-19 virus infection Asymptomatic, currently afebrile, saturating 99% on room air Inflammatory markers mildly elevated,  trend Procalcitonin elevated Chest x-ray with bibasilar atelectasis/scarring, no infiltrate noted Monitor closely, may need steroids/remdesivir if patient becomes symptomatic Airborne/contact precautions, start vitamins

## 2019-10-13 NOTE — ED Notes (Signed)
Pt's wife updated on plan of care

## 2019-10-13 NOTE — Progress Notes (Signed)
Pt's wife with update of pt status and room number. Stacey Drain

## 2019-10-13 NOTE — ED Notes (Signed)
Admitting provider to bedside

## 2019-10-13 NOTE — Progress Notes (Signed)
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  Pharmacy has been consulted for vancomycin dosing.  Plan: Vancomycin 2g x 1 then 1500mg  IV q24 - goal AUC 400-550  Height: 6\' 4"  (193 cm) Weight: 240 lb (108.9 kg) IBW/kg (Calculated) : 86.8  Temp (24hrs), Avg:98.6 F (37 C), Min:98.6 F (37 C), Max:98.6 F (37 C)  Recent Labs  Lab 10/12/19 2310  WBC 10.5  CREATININE 1.62*  LATICACIDVEN 1.1    Estimated Creatinine Clearance: 46.7 mL/min (A) (by C-G formula based on SCr of 1.62 mg/dL (H)).    Allergies  Allergen Reactions  . Sulfa Antibiotics Anaphylaxis, Nausea And Vomiting and Swelling  . Sulfamethoxazole Anaphylaxis, Nausea And Vomiting and Swelling  . Sulfonamide Derivatives Anaphylaxis, Nausea And Vomiting and Swelling  . Bee Venom Rash  . Flonase [Fluticasone] Other (See Comments)    Caused nose bleeds  . Tamsulosin Other (See Comments)    Unknown reaction- Patient doesn't think he is allergic (??)     Thank you for allowing pharmacy to be a part of this patient's care.  Kara Mead 10/13/2019 1:37 AM

## 2019-10-13 NOTE — H&P (Signed)
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  The patient reports that he was in his usual state of health when the suffered a small abrasion to his lower left leg on 10/07/2019.  He went on to develop erythema and tenderness surrounding this, was evaluated by his PCP, started on doxycycline, and given a dose of Rocephin 3 days ago.  He noticed increased redness and swelling on 10/12/2019, states that he followed up with his PCP, and was directed to the ED for further evaluation and management.  The patient denies any missed doses of Eliquis, denies any cough or shortness of breath, and has not noted any fevers or chills.  There has not been any drainage from the lower left leg.  ED Course: Upon arrival to the ED, patient is found to be afebrile, saturating well on room air, and with stable blood pressure.  Chemistry panel is notable for potassium of 5.2 and creatinine 1.62, similar to priors.  CBC is unremarkable and lactic acid reassuringly normal.  Plain films of the lower left leg demonstrate diffuse soft tissue swelling without gas or acute osseous abnormality.  Blood cultures were ordered in the ED, Covid PCR screen was ordered, and vancomycin was initiated.  Review of Systems:  All other systems reviewed and apart from HPI, are negative.  Past Medical History:  Diagnosis Date  . Atrial premature beats   . BPH (benign prostatic hyperplasia)   . BPH (benign prostatic hypertrophy)   . Cataract, right  eye   . Chronic low back pain   . Colon polyp   . Coronary artery disease   . DDD (degenerative disc disease), lumbar   . Dermatitis 11/16  . Elevated IgE level 06/30/2018  . Hyperlipidemia   . Insomnia   . Kidney cysts   . Memory loss 2015  . Non-ischemic cardiomyopathy ([place])   . NSTEMI (non-ST elevated myocardial infarction) ([place]) 08/25/2014  . Osteoarthritis of right knee   . Pituitary tumor 2011   macroadenoma w/ VF compromise-then gamma knife a few years leater for some recurrence  . Positive radioallergosorbent test (RAST) 06/30/2018  . Status post insertion of drug-eluting stent into left anterior descending (LAD) artery 08/28/15   + nuc study.   . Urinary retention   . Vitamin D deficiency 01/2008    Past Surgical History:  Procedure Laterality Date  . ARTHROSCOPIC SURGERY    . BACK SURGERY    . CARDIAC CATHETERIZATION  07/27/2015   Dr Aundra Dubin; oLAD 95%, mLAD 50%, CFX stent OK, OM1 60%, OM2 99%, pPLOM 50%, RCA irregular, AM2 70%, EF 55-60%, no regional wall motion abnormalities.     Marland Kitchen CARDIAC CATHETERIZATION N/A 07/28/2015   Procedure: Left Heart Cath and Coronary Angiography;  Surgeon: [name], MD;  Location: [facility];  Service: Cardiovascular;  Laterality: N/A;  . CARDIAC CATHETERIZATION N/A 07/28/2015   Procedure: Coronary Stent Intervention;  Surgeon: Troy Sine, MD;  Location: [facility];  Service: Cardiovascular;  Laterality: N/A;  .  CATARACT EXTRACTION    . CORONARY ANGIOPLASTY WITH STENT PLACEMENT  07/27/2015   XIENCE ALPINE RX 3.0X33 DES to the LAD, 95%>>0  . HAMMER  BUNION TOE SURGERY    . HAND SURGERY Left   . LEFT HEART CATHETERIZATION WITH CORONARY ANGIOGRAM N/A 08/25/2014   Procedure: LEFT HEART CATHETERIZATION WITH CORONARY ANGIOGRAM;  Surgeon: Burnell Blanks, MD;  Location: [facility];  Service: Cardiovascular;  Laterality: N/A;  . PITUITARY SURGERY     12/01/09 Dr. Wilburn Cornelia and [name].  Marland Kitchen RIGHT/LEFT HEART CATH  AND CORONARY ANGIOGRAPHY N/A 03/19/2018   Procedure: RIGHT/LEFT HEART CATH AND CORONARY ANGIOGRAPHY;  Surgeon: [name], MD;  Location: [facility];  Service: Cardiovascular;  Laterality: N/A;  . RIGHT/LEFT HEART CATH AND CORONARY ANGIOGRAPHY N/A 03/13/2019   Procedure: RIGHT/LEFT HEART CATH AND CORONARY ANGIOGRAPHY;  Surgeon: [name], MD;  Location: [facility];  Service: Cardiovascular;  Laterality: N/A;     reports that he has never smoked. He has never used smokeless tobacco. He reports previous alcohol use. He reports that he does not use drugs.  Allergies  Allergen Reactions  . Sulfa Antibiotics Anaphylaxis, Nausea And Vomiting and Swelling  . Sulfamethoxazole Anaphylaxis, Nausea And Vomiting and Swelling  . Sulfonamide Derivatives Anaphylaxis, Nausea And Vomiting and Swelling  . Bee Venom Rash  . Flonase [Fluticasone] Other (See Comments)    Caused nose bleeds  . Tamsulosin Other (See Comments)    Unknown reaction- Patient doesn't think he is allergic (??)    Family History  Problem Relation Age of Onset  . Congestive Heart Failure Father 42  . Other Mother 69  . Coronary artery disease Brother   . Other Sister 28       polio  . Heart attack Brother        MI/ASCAD 2006  . COPD Sister 35     Prior to Admission medications   Medication Sig Start Date End Date Taking? Authorizing Provider  albuterol (VENTOLIN HFA) 108 (90 Base) MCG/ACT inhaler Inhale 2 puffs into the lungs every 6 (six) hours as needed for wheezing or shortness of breath. 02/12/19  Yes Icard, Octavio Graves, DO  apixaban (ELIQUIS) 2.5 MG TABS tablet Take 1 tablet (2.5 mg total) by mouth 2 (two) times daily. 02/19/19  Yes [name], MD  aspirin EC 81 MG tablet Take 1 tablet (81 mg total) by mouth daily. 02/19/19  Yes [name], MD  budesonide-formoterol [facility]) 160-4.5 MCG/ACT inhaler Inhale 2 puffs into the lungs every 12 (twelve) hours. 07/29/19  Yes Icard, Bradley L,  DO  buPROPion (WELLBUTRIN XL) 150 MG 24 hr tablet Take 150 mg by mouth daily.  01/26/19  Yes [provider]  BYSTOLIC 2.5 MG tablet TAKE 1/2 TABLET(1.25 MG) BY MOUTH DAILY Patient taking differently: Take 1.25 mg by mouth daily.  06/02/19  Yes [name], MD  cabergoline (DOSTINEX) 0.5 MG tablet Take 0.5 mg by mouth once a week. 02/16/19  Yes [provider]  carvedilol (COREG) 12.5 MG tablet Take 1 tablet (12.5 mg total) by mouth 2 (two) times daily with a meal. 07/08/19  Yes [name], MD  doxycycline (VIBRA-TABS) 100 MG tablet Take 100 mg by mouth 2 (two) times daily. 10/08/19  Yes [provider]  dupilumab (DUPIXENT) 200 MG/1.14ML prefilled syringe Inject 200 mg into the skin every 14 (fourteen) days. 08/13/19  Yes Icard, Bradley L, DO  Evolocumab (REPATHA) 140 MG/ML SOSY Inject  1 mL into the skin every 14 (fourteen) days.    Yes [provider]  ezetimibe (ZETIA) 10 MG tablet TAKE 1 TABLET(10 MG) BY MOUTH DAILY Patient taking differently: Take 10 mg by mouth daily.  04/01/19  Yes [name], MD  fluticasone (FLONASE SENSIMIST) 27.5 MCG/SPRAY nasal spray Place 2 sprays into the nose daily.   Yes [provider]  galantamine (RAZADYNE ER) 8 MG 24 hr capsule Take 8 mg by mouth every morning. 07/14/19  Yes [provider]  hydrocortisone (CORTEF) 20 MG tablet Take 20-40 mg by mouth See admin instructions. 20 mg every morning, 40 mg every evening   Yes [provider]  Icosapent Ethyl 1 g CAPS Take 2 capsules (2 g total) by mouth 2 (two) times a day. 04/01/19  Yes Hilty, Nadean Corwin, MD  levothyroxine (SYNTHROID, LEVOTHROID) 150 MCG tablet Take 150 mcg by mouth at bedtime.    Yes [provider]  loratadine (CLARITIN) 10 MG tablet Take 10 mg by mouth daily.   Yes [provider]  memantine (NAMENDA) 5 MG tablet Take 5 mg by mouth 2 (two) times daily.  09/22/19  Yes [provider]  montelukast  (SINGULAIR) 10 MG tablet Take 1 tablet (10 mg total) by mouth at bedtime. 07/01/18  Yes Icard, Bradley L, DO  MYRBETRIQ 50 MG TB24 tablet Take 50 mg by mouth at bedtime.  11/22/16  Yes [provider]  nitroGLYCERIN (NITROSTAT) 0.4 MG SL tablet Place 1 tablet (0.4 mg total) under the tongue every 5 (five) minutes as needed for chest pain. 03/10/18  Yes [name], MD  omeprazole (PRILOSEC) 40 MG capsule Take 40 mg by mouth daily.   Yes [provider]  potassium chloride (KLOR-CON) 10 MEQ tablet Take 2 tablets (20 mEq total) by mouth daily. 07/31/19  Yes [name], MD  ranolazine (RANEXA) 500 MG 12 hr tablet Take 1 tablet (500 mg total) by mouth 2 (two) times daily. 03/13/19  Yes [name], MD  testosterone cypionate (DEPOTESTOTERONE CYPIONATE) 200 MG/ML injection Inject 100 mg into the muscle every 21 ( twenty-one) days.    Yes [provider]  torsemide (DEMADEX) 10 MG tablet Take 1 tablet (10 mg total) by mouth daily. 09/08/19  Yes Hilty, Nadean Corwin, MD  triamcinolone cream (KENALOG) 0.1 % Apply 1 application topically 2 (two) times daily as needed (irritation).    Yes [provider]    Physical Exam: Vitals:   10/12/19 2135 10/12/19 2304 10/12/19 2345 10/13/19 0149  BP: (!) 153/120 (!) 151/99 (!) 150/83 (!) 155/83  Pulse: 64 62 (!) 56 68  Resp: 18 12 15 20   Temp:      TempSrc:      SpO2: 99% 99% 99% 98%  Weight:      Height:        Constitutional: NAD, calm  Eyes: PERTLA, lids and conjunctivae normal ENMT: Mucous membranes are moist. Posterior pharynx clear of any exudate or lesions.   Neck: normal, supple, no masses, no thyromegaly Respiratory: clear to auscultation bilaterally, no wheezing, no crackles. Normal respiratory effort. No accessory muscle use.  Cardiovascular: S1 & S2 heard, regular rate and rhythm. No significant JVD. Abdomen: No distension, no tenderness, soft. Bowel sounds normal.  Musculoskeletal: no clubbing /  cyanosis. No joint deformity upper and lower extremities.   Skin: lower left leg erythema, warmth, and edema. Warm, dry, well-perfused. Neurologic: No gross facial asymmetry. Gross hearing loss. Sensation intact, moving  all extremities.  Psychiatric: Alert and answering basic questions appropriately. Calm, cooperative.     Labs on Admission: I have personally reviewed following labs and imaging studies  CBC: Recent Labs  Lab 10/12/19 2310  WBC 10.5  NEUTROABS 7.4  HGB 13.9  HCT 42.0  MCV 99.5  PLT 0000000   Basic Metabolic Panel: Recent Labs  Lab 10/12/19 2310  NA 142  K 5.2*  CL 106  CO2 25  GLUCOSE 96  BUN 31*  CREATININE 1.62*  CALCIUM 9.0   GFR: Estimated Creatinine Clearance: 46.7 mL/min (A) (by C-G formula based on SCr of 1.62 mg/dL (H)). Liver Function Tests: No results for input(s): AST, ALT, ALKPHOS, BILITOT, PROT, ALBUMIN in the last 168 hours. No results for input(s): LIPASE, AMYLASE in the last 168 hours. No results for input(s): AMMONIA in the last 168 hours. Coagulation Profile: No results for input(s): INR, PROTIME in the last 168 hours. Cardiac Enzymes: No results for input(s): CKTOTAL, CKMB, CKMBINDEX, TROPONINI in the last 168 hours. BNP (last 3 results) No results for input(s): PROBNP in the last 8760 hours. HbA1C: No results for input(s): HGBA1C in the last 72 hours. CBG: No results for input(s): GLUCAP in the last 168 hours. Lipid Profile: No results for input(s): CHOL, HDL, LDLCALC, TRIG, CHOLHDL, LDLDIRECT in the last 72 hours. Thyroid Function Tests: No results for input(s): TSH, T4TOTAL, FREET4, T3FREE, THYROIDAB in the last 72 hours. Anemia Panel: No results for input(s): VITAMINB12, FOLATE, FERRITIN, TIBC, IRON, RETICCTPCT in the last 72 hours. Urine analysis:    Component Value Date/Time   COLORURINE YELLOW 12/02/2016 1215   APPEARANCEUR CLEAR 12/02/2016 1215   LABSPEC 1.017 12/02/2016 1215   PHURINE 5.0 12/02/2016 1215   GLUCOSEU  NEGATIVE 12/02/2016 1215   HGBUR SMALL (A) 12/02/2016 1215   BILIRUBINUR NEGATIVE 12/02/2016 1215   KETONESUR NEGATIVE 12/02/2016 1215   PROTEINUR NEGATIVE 12/02/2016 1215   UROBILINOGEN 0.2 02/13/2011 1100   NITRITE NEGATIVE 12/02/2016 1215   LEUKOCYTESUR NEGATIVE 12/02/2016 1215   Sepsis Labs: @LABRCNTIP (procalcitonin:4,lacticidven:4) )No results found for this or any previous visit (from the past 240 hour(s)).   Radiological Exams on Admission: DG Tibia/Fibula Left  Result Date: 10/13/2019 CLINICAL DATA:  87 year old male with left lower leg injury on tractor several days ago but progressive swelling, infection since then. EXAM: LEFT TIBIA AND FIBULA - 2 VIEW COMPARISON:  None. FINDINGS: Preserved alignment at the left knee and ankle. Calcified peripheral vascular disease with superimposed confluent venous vascular or dystrophic calcifications in the posterior calf. Diffuse soft tissue swelling and stranding. No soft tissue gas identified. No radiopaque foreign body identified. No acute osseous abnormality identified. IMPRESSION: 1. Diffuse soft tissue swelling with no soft tissue gas or acute osseous abnormality. 2. Calcified peripheral vascular disease with other dystrophic calcifications in the posterior calf. Electronically Signed   By: Genevie Ann M.D.   On: 10/13/2019 00:21    Assessment/Plan  Pharmacy medication-reconciliation pending at time of admission   1. Left leg cellulitis  - Presents with worsening redness and swelling to lower left leg despite doxycline and IM Rocephin 3 days prior to presentation  - There is no fever, leukocytosis, or elevated lactate   - Blood cultures and vancomycin ordered from ED  - Continue vancomycin, elevate leg, follow cultures and clinical course   2. History of DVT & PE  - Continue Eliquis    3. Hypertension  - Continue beta-blocker   4. CAD  - No anginal complaints  - Continue  aspirin and beta-blocker   5. Chronic diastolic CHF  -  Appears compensated  - Continue diuretic, Coreg    6. Asthma  - No cough or wheezing in ED  - Continue inhalers, Singulair    7. Adrenal insufficiency  - Continue Cortef     DVT prophylaxis: Eliquis  Code Status: Full  Family Communication: None  Consults called: None  Admission status: Observation     Vianne Bulls, MD [facility] Pager [phone]  If 7PM-7AM, please contact night-coverage [email or web address] Password [facility]  10/13/2019, 3:09 AM

## 2019-10-13 NOTE — Plan of Care (Signed)
  Problem: Clinical Measurements: Goal: Ability to avoid or minimize complications of infection will improve Outcome: Progressing   Problem: Education: Goal: Knowledge of General Education information will improve Description: Including pain rating scale, medication(s)/side effects and non-pharmacologic comfort measures Outcome: Progressing   Problem: Education: Goal: Knowledge of risk factors and measures for prevention of condition will improve Outcome: Progressing

## 2019-10-13 NOTE — ED Notes (Signed)
Erick Blinks, wife wants an update on her husband because he is confused and can't update her, [phone], cell [phone].

## 2019-10-14 DIAGNOSIS — L03116 Cellulitis of left lower limb: Principal | ICD-10-CM

## 2019-10-14 LAB — CBC WITH DIFFERENTIAL/PLATELET
Abs Immature Granulocytes: 0.14 10*3/uL — ABNORMAL HIGH (ref 0.00–0.07)
Basophils Absolute: 0.1 10*3/uL (ref 0.0–0.1)
Basophils Relative: 1 %
Eosinophils Absolute: 0.2 10*3/uL (ref 0.0–0.5)
Eosinophils Relative: 2 %
HCT: 43.7 % (ref 39.0–52.0)
Hemoglobin: 14.3 g/dL (ref 13.0–17.0)
Immature Granulocytes: 1 %
Lymphocytes Relative: 10 %
Lymphs Abs: 1.2 10*3/uL (ref 0.7–4.0)
MCH: 32.4 pg (ref 26.0–34.0)
MCHC: 32.7 g/dL (ref 30.0–36.0)
MCV: 99.1 fL (ref 80.0–100.0)
Monocytes Absolute: 0.6 10*3/uL (ref 0.1–1.0)
Monocytes Relative: 5 %
Neutro Abs: 9.2 10*3/uL — ABNORMAL HIGH (ref 1.7–7.7)
Neutrophils Relative %: 81 %
Platelets: 332 10*3/uL (ref 150–400)
RBC: 4.41 MIL/uL (ref 4.22–5.81)
RDW: 14.4 % (ref 11.5–15.5)
WBC: 11.3 10*3/uL — ABNORMAL HIGH (ref 4.0–10.5)
nRBC: 0 % (ref 0.0–0.2)

## 2019-10-14 LAB — COMPREHENSIVE METABOLIC PANEL
ALT: 31 U/L (ref 0–44)
AST: 25 U/L (ref 15–41)
Albumin: 3.5 g/dL (ref 3.5–5.0)
Alkaline Phosphatase: 83 U/L (ref 38–126)
Anion gap: 12 (ref 5–15)
BUN: 30 mg/dL — ABNORMAL HIGH (ref 8–23)
CO2: 22 mmol/L (ref 22–32)
Calcium: 8.9 mg/dL (ref 8.9–10.3)
Chloride: 107 mmol/L (ref 98–111)
Creatinine, Ser: 1.39 mg/dL — ABNORMAL HIGH (ref 0.61–1.24)
GFR calc Af Amer: 54 mL/min — ABNORMAL LOW (ref >60)
GFR calc non Af Amer: 47 mL/min — ABNORMAL LOW (ref >60)
Glucose, Bld: 116 mg/dL — ABNORMAL HIGH (ref 70–99)
Potassium: 4.1 mmol/L (ref 3.5–5.1)
Sodium: 141 mmol/L (ref 135–145)
Total Bilirubin: 0.6 mg/dL (ref 0.3–1.2)
Total Protein: 7 g/dL (ref 6.5–8.1)

## 2019-10-14 LAB — D-DIMER, QUANTITATIVE: D-Dimer, Quant: 1.03 ug/mL-FEU — ABNORMAL HIGH (ref 0.00–0.50)

## 2019-10-14 LAB — FERRITIN: Ferritin: 201 ng/mL (ref 24–336)

## 2019-10-14 LAB — C-REACTIVE PROTEIN: CRP: 7 mg/dL — ABNORMAL HIGH (ref <1.0)

## 2019-10-14 MED ORDER — IPRATROPIUM-ALBUTEROL 20-100 MCG/ACT IN AERS
1.0000 | INHALATION_SPRAY | Freq: Two times a day (BID) | RESPIRATORY_TRACT | Status: DC
  Administered 2019-10-14 – 2019-10-15 (×2): 1 via RESPIRATORY_TRACT

## 2019-10-14 NOTE — Progress Notes (Signed)
PROGRESS NOTE    [name] [place], [name]  [id number] DOB: 04-07-1934 DOA: 10/12/2019 PCP: [name], MD   Brief Narrative: 86 year old male with medical history significant for CAD, CHF, CKD, DVT/PE on Eliquis, s/p pituitary tumor removal on hydrocortisone, possible dementia, presented to the ED c/o progressive swelling, erythema, tenderness of left lower extremity for the past few days.  Patient failed outpatient antibiotics (doxycycline, and a dose of Rocephin).  PCP recommended patient to go to the ED for further evaluation.  In the ED, vital signs stable, labs unremarkable except for creatinine which is at his baseline.  X-ray of lower left leg showed soft tissue swelling without gas or acute osseous abnormality.  Patient started on vancomycin.  Of note, patient Covid test came back positive, patient continues to remain asymptomatic, saturating well on room air, afebrile.  Assessment & Plan:   Principal Problem:   Left leg cellulitis Active Problems:   Cardiomyopathy ([place])   CAD in native artery   Pulmonary embolism (HCC)   CKD (chronic kidney disease) stage 3, GFR 30-59 ml/min   Anxiety   HTN (hypertension)   Cellulitis  Plan  LLE cellulitis Currently afebrile, with no leukocytosis X-ray showed diffuse soft tissue swelling with no soft tissue gas or acute osseous abnormality Procalcitonin elevated Continue vancomycin, will add ceftriaxone Will plan to transition to p.o. antibiotics on discharge home tomorrow. He will need to follow-up with wound care on discharge  COVID-19 virus infection Asymptomatic, currently afebrile, saturating 99% on room air Inflammatory markers mildly elevated, trend Procalcitonin elevated Chest x-ray with bibasilar atelectasis/scarring, no infiltrate noted Patient has not required oxygen and not in respiratory distress. Continue with supportive therapy with zinc, vitamin C  Essential hypertension Continue with beta-blocker  History of DVT  and PE Continue with Eliquis  History of chronic heart failure with preserved ejection fraction.  Not in acute decompensation Continue with guideline directed medical therapy, aspirin, statin and beta-blocker  History of asthma.  Stable Continue with bronchodilator/Singulair  DVT prophylaxis: Eliquis  Code Status: Full code  Family Communication: None   Disposition Plan: For possible discharge tomorrow     Consultants:   None  Procedures: None   Antimicrobials: Vancomycin and ceftriaxone   Subjective: Patient was seen at bedside.  Not in acute distress.  Ambulating in room but with gait instability. Patient stated he wants to go home tomorrow.  Discussed plan of transition to p.o. antibiotics and possible discharge home  Objective: Vitals:   10/14/19 0127 10/14/19 0445 10/14/19 0752 10/14/19 1505  BP: (!) 150/95 (!) 159/84  (!) 150/91  Pulse: 72 (!) 57  60  Resp: 18 18  18   Temp: 98.3 F (36.8 C) (!) 97.5 F (36.4 C)  (!) 97.5 F (36.4 C)  TempSrc: Oral Oral  Oral  SpO2:  98% 99% 100%  Weight:      Height:        Intake/Output Summary (Last 24 hours) at 10/14/2019 1931 Last data filed at 10/14/2019 1744 Gross per 24 hour  Intake 820 ml  Output 1450 ml  Net -630 ml   Filed Weights   10/12/19 1343 10/13/19 1600  Weight: 108.9 kg 109.3 kg    Examination:  General exam: Appears calm and comfortable  Respiratory system: Clear to auscultation. Respiratory effort normal. Cardiovascular system: S1 & S2 heard, RRR. No JVD, murmurs, rubs, gallops or clicks. No pedal edema. Gastrointestinal system: Abdomen is nondistended, soft and nontender. No organomegaly or masses felt. Normal bowel sounds heard.  Central nervous system: Alert and oriented. No focal neurological deficits. Extremities: Left lower extremity erythema with multiple ulcers dressed.  Wound dressing clean and dry.  Symmetric 5 x 5 power. Skin: No rashes, lesions or ulcers Psychiatry: Judgement and  insight appear normal. Mood & affect appropriate.     Data Reviewed: I have personally reviewed following labs and imaging studies  CBC: Recent Labs  Lab 10/12/19 2310 10/13/19 0820 10/13/19 1641 10/14/19 0403  WBC 10.5 10.4 10.5 11.3*  NEUTROABS 7.4 8.2* 8.4* 9.2*  HGB 13.9 14.6 14.6 14.3  HCT 42.0 44.4 44.0 43.7  MCV 99.5 99.3 99.1 99.1  PLT 296 298 314 [id number]   Basic Metabolic Panel: Recent Labs  Lab 10/12/19 2310 10/13/19 1110 10/13/19 1641 10/14/19 0403  NA 142 141 139 141  K 5.2* 4.3 4.2 4.1  CL 106 106 104 107  CO2 25 23 25 22   GLUCOSE 96 111* 105* 116*  BUN 31* 31* 32* 30*  CREATININE 1.62* 1.45* 1.50* 1.39*  CALCIUM 9.0 8.9 8.9 8.9   GFR: Estimated Creatinine Clearance: 54.6 mL/min (A) (by C-G formula based on SCr of 1.39 mg/dL (H)). Liver Function Tests: Recent Labs  Lab 10/13/19 1110 10/14/19 0403  AST 29 25  ALT 32 31  ALKPHOS 86 83  BILITOT 0.9 0.6  PROT 7.4 7.0  ALBUMIN 3.7 3.5   No results for input(s): LIPASE, AMYLASE in the last 168 hours. No results for input(s): AMMONIA in the last 168 hours. Coagulation Profile: No results for input(s): INR, PROTIME in the last 168 hours. Cardiac Enzymes: No results for input(s): CKTOTAL, CKMB, CKMBINDEX, TROPONINI in the last 168 hours. BNP (last 3 results) No results for input(s): PROBNP in the last 8760 hours. HbA1C: No results for input(s): HGBA1C in the last 72 hours. CBG: No results for input(s): GLUCAP in the last 168 hours. Lipid Profile: No results for input(s): CHOL, HDL, LDLCALC, TRIG, CHOLHDL, LDLDIRECT in the last 72 hours. Thyroid Function Tests: No results for input(s): TSH, T4TOTAL, FREET4, T3FREE, THYROIDAB in the last 72 hours. Anemia Panel: Recent Labs    10/13/19 1110 10/14/19 0403  FERRITIN 212 201   Sepsis Labs: Recent Labs  Lab 10/12/19 2310 10/13/19 1110  PROCALCITON  --  0.81  LATICACIDVEN 1.1  --     Recent Results (from the past 240 hour(s))  SARS  CORONAVIRUS 2 (TAT 6-24 HRS) Nasopharyngeal Nasopharyngeal Swab     Status: Abnormal   Collection Time: 10/12/19 11:15 PM   Specimen: Nasopharyngeal Swab  Result Value Ref Range Status   SARS Coronavirus 2 POSITIVE (A) NEGATIVE Final    Comment: RESULT CALLED TO, READ BACK BY AND VERIFIED WITH: RN [name], C [id number] FCP (NOTE) SARS-CoV-2 target nucleic acids are NOT DETECTED. The SARS-CoV-2 RNA is generally detectable in upper and lower respiratory specimens during the acute phase of infection. Negative results do not preclude SARS-CoV-2 infection, do not rule out co-infections with other pathogens, and should not be used as the sole basis for treatment or other patient management decisions. Negative results must be combined with clinical observations, patient history, and epidemiological information. The expected result is Negative. Fact Sheet for Patients: SugarRoll.be Fact Sheet for Healthcare Providers: https://www.woods-mathews.com/ This test is not yet approved or cleared by the [place] FDA and  has been authorized for detection and/or diagnosis of SARS-CoV-2 by FDA under an Emergency Use Authorization (EUA). This EUA will remain  in effect (meaning thi s test can be used) for the duration  of the COVID-19 declaration under Section 564(b)(1) of the Act, 21 U.S.C. section 360bbb-3(b)(1), unless the authorization is terminated or revoked sooner. Performed at [facility], [place] [address], [place], [place] [address]   Culture, blood (routine x 2)     Status: None (Preliminary result)   Collection Time: 10/13/19 12:27 AM   Specimen: BLOOD  Result Value Ref Range Status   Specimen Description   Final    BLOOD LEFT WRIST Performed at [facility] [address] [place], [place] [address]    Special Requests   Final    BOTTLES DRAWN AEROBIC AND ANAEROBIC Blood Culture results may not be optimal due to an  inadequate volume of blood received in culture bottles Performed at [facility] [address], [place], [place] [address]    Culture   Final    NO GROWTH 1 DAY Performed at [facility], [place] [address] [place], [place] [address]    Report Status PENDING  Incomplete  Culture, blood (routine x 2)     Status: None (Preliminary result)   Collection Time: 10/13/19 12:39 AM   Specimen: BLOOD  Result Value Ref Range Status   Specimen Description   Final    BLOOD BLOOD RIGHT FOREARM Performed at [facility] [address] [place], [place] [address]    Special Requests   Final    BOTTLES DRAWN AEROBIC ONLY Blood Culture results may not be optimal due to an inadequate volume of blood received in culture bottles Performed at [facility] [address], [place], [place] [address]    Culture   Final    NO GROWTH 1 DAY Performed at [facility], [place] [address], [place], [place] [address]    Report Status PENDING  Incomplete         Radiology Studies: DG Tibia/Fibula Left  Result Date: 10/13/2019 CLINICAL DATA:  86 year old male with left lower leg injury on tractor several days ago but progressive swelling, infection since then. EXAM: LEFT TIBIA AND FIBULA - 2 VIEW COMPARISON:  None. FINDINGS: Preserved alignment at the left knee and ankle. Calcified peripheral vascular disease with superimposed confluent venous vascular or dystrophic calcifications in the posterior calf. Diffuse soft tissue swelling and stranding. No soft tissue gas identified. No radiopaque foreign body identified. No acute osseous abnormality identified. IMPRESSION: 1. Diffuse soft tissue swelling with no soft tissue gas or acute osseous abnormality. 2. Calcified peripheral vascular disease with other dystrophic calcifications in the posterior calf. Electronically Signed   By: Genevie Ann M.D.   On: 10/13/2019 00:21   DG Chest Port 1 View  Result  Date: 10/13/2019 CLINICAL DATA:  Positive COVID-19 test EXAM: PORTABLE CHEST 1 VIEW COMPARISON:  2018 FINDINGS: Low lung volumes reflecting shallow inspiration. Bibasilar atelectasis/scarring. No pleural effusion or pneumothorax. Cardiomediastinal contours are likely within limits for portable technique with top-normal heart size. IMPRESSION: Bibasilar atelectasis/scarring. Electronically Signed   By: Macy Mis M.D.   On: 10/13/2019 11:45        Scheduled Meds: . apixaban  2.5 mg Oral BID  . vitamin C  500 mg Oral Daily  . aspirin EC  81 mg Oral Daily  . buPROPion  150 mg Oral Daily  . ezetimibe  10 mg Oral Daily  . fluticasone  2 spray Each Nare Daily  . galantamine  8 mg Oral q morning - 10a  . hydrocortisone  20 mg Oral Daily  .  hydrocortisone  40 mg Oral QHS  . Ipratropium-Albuterol  1 puff Inhalation BID  . levothyroxine  150 mcg Oral Daily  . loratadine  10 mg Oral Daily  . memantine  5 mg Oral BID  . mirabegron ER  50 mg Oral QHS  . mometasone-formoterol  2 puff Inhalation BID  . montelukast  10 mg Oral QHS  . nebivolol  1.25 mg Oral Daily  . pantoprazole  80 mg Oral Daily  . pneumococcal 23 valent vaccine  0.5 mL Intramuscular Tomorrow-1000  . ranolazine  500 mg Oral BID  . sodium chloride flush  3 mL Intravenous Q12H  . torsemide  10 mg Oral Daily  . zinc sulfate  220 mg Oral Daily   Continuous Infusions: . sodium chloride    . cefTRIAXone (ROCEPHIN)  IV 1 g (10/14/19 0918)  . vancomycin 1,500 mg (10/13/19 2246)     LOS: 1 day    Time spent: [place], MD [facility] Pager [phone] `  If 7PM-7AM, please contact night-coverage [email or web address] Password [facility] 10/14/2019, 7:31 PM

## 2019-10-14 NOTE — Progress Notes (Signed)
Report received from vera RN/4W. Pt arrived to 1522 via bed and oriented to room.  Call bell education with pt for use for needs/safety. VSS

## 2019-10-15 LAB — COMPREHENSIVE METABOLIC PANEL
ALT: 31 U/L (ref 0–44)
AST: 24 U/L (ref 15–41)
Albumin: 3.6 g/dL (ref 3.5–5.0)
Alkaline Phosphatase: 84 U/L (ref 38–126)
Anion gap: 11 (ref 5–15)
BUN: 26 mg/dL — ABNORMAL HIGH (ref 8–23)
CO2: 26 mmol/L (ref 22–32)
Calcium: 8.9 mg/dL (ref 8.9–10.3)
Chloride: 102 mmol/L (ref 98–111)
Creatinine, Ser: 1.37 mg/dL — ABNORMAL HIGH (ref 0.61–1.24)
GFR calc Af Amer: 55 mL/min — ABNORMAL LOW (ref >60)
GFR calc non Af Amer: 47 mL/min — ABNORMAL LOW (ref >60)
Glucose, Bld: 118 mg/dL — ABNORMAL HIGH (ref 70–99)
Potassium: 3.9 mmol/L (ref 3.5–5.1)
Sodium: 139 mmol/L (ref 135–145)
Total Bilirubin: 0.7 mg/dL (ref 0.3–1.2)
Total Protein: 7.1 g/dL (ref 6.5–8.1)

## 2019-10-15 LAB — C-REACTIVE PROTEIN: CRP: 3.9 mg/dL — ABNORMAL HIGH (ref <1.0)

## 2019-10-15 LAB — FERRITIN: Ferritin: 185 ng/mL (ref 24–336)

## 2019-10-15 LAB — D-DIMER, QUANTITATIVE: D-Dimer, Quant: 1.08 ug/mL-FEU — ABNORMAL HIGH (ref 0.00–0.50)

## 2019-10-15 MED ORDER — ZINC SULFATE 220 (50 ZN) MG PO CAPS: 220.0000 mg | ORAL_CAPSULE | Freq: Every day | ORAL | 0 refills | Status: DC

## 2019-10-15 MED ORDER — HYDROCOD POLST-CPM POLST ER 10-8 MG/5ML PO SUER: 5.0000 mL | Freq: Two times a day (BID) | ORAL | 0 refills | Status: DC | PRN

## 2019-10-15 MED ORDER — DOXYCYCLINE HYCLATE 100 MG PO TABS: 100.0000 mg | ORAL_TABLET | Freq: Two times a day (BID) | ORAL | 0 refills | Status: DC

## 2019-10-15 MED ORDER — ASCORBIC ACID 500 MG PO TABS: 500.0000 mg | ORAL_TABLET | Freq: Every day | ORAL | 0 refills | Status: AC

## 2019-10-15 MED ORDER — CEFDINIR 300 MG PO CAPS: 300.0000 mg | ORAL_CAPSULE | Freq: Two times a day (BID) | ORAL | 0 refills | Status: DC

## 2019-10-15 NOTE — Discharge Summary (Signed)
Physician Discharge Summary  Patient ID: [name], [name] MRN: [id number] DOB/AGE: Jan 26, 1934 86 y.o.  Admit date: 10/12/2019 Discharge date: 10/15/2019  Admission Diagnoses: Left lower extremity cellulitis COVID-19 virus infection  Discharge Diagnoses:  Principal Problem:   Left leg cellulitis Active Problems:   Cardiomyopathy ([place])   CAD in native artery   Pulmonary embolism (HCC)   CKD (chronic kidney disease) stage 3, GFR 30-59 ml/min   Anxiety   HTN (hypertension)   Cellulitis   Discharged Condition: stable  Hospital Course: [facility]O [name], [name] is a 86 y.o. male with medical history significant for coronary artery disease, chronic diastolic CHF, renal insufficiency, hypertension, DVT and PE on Eliquis, and chronic kidney disease stage III, now presenting to the emergency department for evaluation of progressive left leg swelling and redness despite outpatient antibiotics.  The patient reports that he was in his usual state of health when the suffered a small abrasion to his lower left leg on 10/07/2019.  He went on to develop erythema and tenderness surrounding this, was evaluated by his PCP, started on doxycycline, and given a dose of Rocephin 3 days ago.  He noticed increased redness and swelling on 10/12/2019, states that he followed up with his PCP, and was directed to the ED for further evaluation and management.  The patient denies any missed doses of Eliquis, denies any cough or shortness of breath, and has not noted any fevers or chills.  There has not been any drainage from the lower left leg.  ED Course: Upon arrival to the ED, patient is found to be afebrile, saturating well on room air, and with stable blood pressure.  Chemistry panel is notable for potassium of 5.2 and creatinine 1.62, similar to priors.  CBC is unremarkable and lactic acid reassuringly normal.  Plain films of the lower left leg demonstrate diffuse soft tissue swelling without gas or acute osseous abnormality.   Blood cultures were ordered in the ED, Covid PCR screen was ordered, and vancomycin was initiated. Principal Problem:   Left leg cellulitis Active Problems:   Cardiomyopathy ([name])   CAD in native artery   Pulmonary embolism (HCC)   CKD (chronic kidney disease) stage 3, GFR 30-59 ml/min   Anxiety   HTN (hypertension)   Cellulitis  Plan  LLE cellulitis Currently afebrile, with no leukocytosis X-ray showed diffuse soft tissue swelling with no soft tissue gas or acute osseous abnormality Procalcitonin elevated Continue vancomycin, will add ceftriaxone Will plan to transition to p.o. antibiotics on discharge home tomorrow. He will need to follow-up with wound care on discharge  COVID-19 virus infection Asymptomatic, currently afebrile, saturating 99% on room air Inflammatory markers mildly elevated, trend Procalcitonin elevated Chest x-ray with bibasilar atelectasis/scarring,no infiltrate noted Patient has not required oxygen and not in respiratory distress. Continue with supportive therapy with zinc, vitamin C  Essential hypertension Continue with beta-blocker  History of DVT and PE Continue with Eliquis  History of chronic heart failure with preserved ejection fraction.  Not in acute decompensation Continue with guideline directed medical therapy, aspirin, statin and beta-blocker  History of asthma.  Stable Continue with bronchodilator/Singulair  Consults: None  Significant Diagnostic Studies: microbiology: blood culture: negative and radiology: CXR: Bibasilar atelectasis  Treatments: antibiotics: vancomycin and ceftriaxone  Discharge Exam: Blood pressure 131/72, pulse 69, temperature (!) 97.5 F (36.4 C), temperature source Axillary, resp. rate 20, height 6\' 4"  (1.93 m), weight 109.3 kg, SpO2 100 %. General appearance: alert, cooperative, no distress and mildly obese Eyes: conjunctivae/corneas  clear. PERRL, EOM's intact. Fundi benign. Neck: no adenopathy, no  carotid bruit, no JVD, supple, symmetrical, trachea midline and thyroid not enlarged, symmetric, no tenderness/mass/nodules Resp: clear to auscultation bilaterally Cardio: regular rate and rhythm, S1, S2 normal, no murmur, click, rub or gallop GI: soft, non-tender; bowel sounds normal; no masses,  no organomegaly Extremities: Left lower extremity multiple ulcers dressed in Aquacel. Skin: Skin color, texture, turgor normal. No rashes or lesions Neurologic: Alert and oriented X 3, normal strength and tone. Normal symmetric reflexes. Normal coordination and gait Mental status: Alert, oriented, thought content appropriate, orientation: time, person, place Gait: Abnormal  Disposition:    Allergies as of 10/15/2019      Reactions   Sulfa Antibiotics Anaphylaxis, Nausea And Vomiting, Swelling   Sulfamethoxazole Anaphylaxis, Nausea And Vomiting, Swelling   Sulfonamide Derivatives Anaphylaxis, Nausea And Vomiting, Swelling   Bee Venom Rash   Flonase [fluticasone] Other (See Comments)   Caused nose bleeds   Tamsulosin Other (See Comments)   Unknown reaction- Patient doesn't think he is allergic (??)      Medication List    STOP taking these medications   hydrocortisone 20 MG tablet Commonly known as: CORTEF     TAKE these medications   albuterol 108 (90 Base) MCG/ACT inhaler Commonly known as: VENTOLIN HFA Inhale 2 puffs into the lungs every 6 (six) hours as needed for wheezing or shortness of breath.   apixaban 2.5 MG Tabs tablet Commonly known as: ELIQUIS Take 1 tablet (2.5 mg total) by mouth 2 (two) times daily.   ascorbic acid 500 MG tablet Commonly known as: VITAMIN C Take 1 tablet (500 mg total) by mouth daily. Start taking on: October 16, 2019   aspirin EC 81 MG tablet Take 1 tablet (81 mg total) by mouth daily.   budesonide-formoterol 160-4.5 MCG/ACT inhaler Commonly known as: Symbicort Inhale 2 puffs into the lungs every 12 (twelve) hours.   buPROPion 150 MG 24 hr  tablet Commonly known as: WELLBUTRIN XL Take 150 mg by mouth daily.   Bystolic 2.5 MG tablet Generic drug: nebivolol TAKE 1/2 TABLET(1.25 MG) BY MOUTH DAILY What changed: See the new instructions.   cabergoline 0.5 MG tablet Commonly known as: DOSTINEX Take 0.5 mg by mouth once a week.   carvedilol 12.5 MG tablet Commonly known as: COREG Take 1 tablet (12.5 mg total) by mouth 2 (two) times daily with a meal.   cefdinir 300 MG capsule Commonly known as: OMNICEF Take 1 capsule (300 mg total) by mouth 2 (two) times daily.   chlorpheniramine-HYDROcodone 10-8 MG/5ML Suer Commonly known as: TUSSIONEX Take 5 mLs by mouth every 12 (twelve) hours as needed for cough.   doxycycline 100 MG tablet Commonly known as: VIBRA-TABS Take 1 tablet (100 mg total) by mouth 2 (two) times daily.   Dupixent 200 MG/1.14ML prefilled syringe Generic drug: dupilumab Inject 200 mg into the skin every 14 (fourteen) days.   ezetimibe 10 MG tablet Commonly known as: ZETIA TAKE 1 TABLET(10 MG) BY MOUTH DAILY What changed:   how much to take  how to take this  when to take this  additional instructions   Flonase Sensimist 27.5 MCG/SPRAY nasal spray Generic drug: fluticasone Place 2 sprays into the nose daily.   galantamine 8 MG 24 hr capsule Commonly known as: RAZADYNE ER Take 8 mg by mouth every morning.   icosapent Ethyl 1 g capsule Commonly known as: VASCEPA Take 2 capsules (2 g total) by mouth 2 (two) times a day.  levothyroxine 150 MCG tablet Commonly known as: SYNTHROID Take 150 mcg by mouth at bedtime.   loratadine 10 MG tablet Commonly known as: CLARITIN Take 10 mg by mouth daily.   memantine 5 MG tablet Commonly known as: NAMENDA Take 5 mg by mouth 2 (two) times daily.   montelukast 10 MG tablet Commonly known as: SINGULAIR Take 1 tablet (10 mg total) by mouth at bedtime.   Myrbetriq 50 MG Tb24 tablet Generic drug: mirabegron ER Take 50 mg by mouth at bedtime.    nitroGLYCERIN 0.4 MG SL tablet Commonly known as: NITROSTAT Place 1 tablet (0.4 mg total) under the tongue every 5 (five) minutes as needed for chest pain.   omeprazole 40 MG capsule Commonly known as: PRILOSEC Take 40 mg by mouth daily.   potassium chloride 10 MEQ tablet Commonly known as: KLOR-CON Take 2 tablets (20 mEq total) by mouth daily.   ranolazine 500 MG 12 hr tablet Commonly known as: Ranexa Take 1 tablet (500 mg total) by mouth 2 (two) times daily.   Repatha 140 MG/ML Sosy Generic drug: Evolocumab Inject 1 mL into the skin every 14 (fourteen) days.   testosterone cypionate 200 MG/ML injection Commonly known as: DEPOTESTOSTERONE CYPIONATE Inject 100 mg into the muscle every 21 ( twenty-one) days.   torsemide 10 MG tablet Commonly known as: DEMADEX Take 1 tablet (10 mg total) by mouth daily.   triamcinolone cream 0.1 % Commonly known as: KENALOG Apply 1 application topically 2 (two) times daily as needed (irritation).   zinc sulfate 220 (50 Zn) MG capsule Take 1 capsule (220 mg total) by mouth daily. Start taking on: October 16, 2019        Signed: JREAM MCQUERRY 10/15/2019, 1:32 PM

## 2019-10-15 NOTE — Progress Notes (Signed)
Pt has not slept at all this shift, currently wanting to get dressed in day clothes & is ready to go home

## 2019-10-16 ENCOUNTER — Telehealth: Payer: Self-pay | Admitting: Pulmonary Disease

## 2019-10-16 NOTE — Telephone Encounter (Signed)
Called and spoke with pt's wife [name] who stated pt had gotten an infection in leg and was admitted to hospital 1/11 for IV antibiotic treatment. [name] stated while pt was in hospital, pt was tested for covid and pt ended up testing positive for covid.  [name] said pt was sent home on two different inhalers of dulera as pt had been denied insurance coverage for the symbicort, and combivent respimat.  [name] wanted to know what needed to be done about the new inhalers due to them being sent home with pt after hosp stay and I stated to her that we could get pt scheduled for a televisit with BI to discuss this and then we could send in an Rx. [name] verbalized understanding and pt has been scheduled for a televisit with BI 2/3. Nothing further needed.

## 2019-10-18 LAB — CULTURE, BLOOD (ROUTINE X 2)
Culture: NO GROWTH
Culture: NO GROWTH

## 2019-10-19 ENCOUNTER — Other Ambulatory Visit (HOSPITAL_COMMUNITY): Payer: Self-pay

## 2019-10-19 MED ORDER — RANOLAZINE ER 500 MG PO TB12: 500.0000 mg | ORAL_TABLET | Freq: Two times a day (BID) | ORAL | 6 refills | Status: DC

## 2019-10-21 ENCOUNTER — Other Ambulatory Visit: Payer: Self-pay | Admitting: *Deleted

## 2019-10-21 NOTE — Patient Outreach (Signed)
[facility]) Care Management  10/21/2019  [place], [name] 06/11/1934 [id number]   RED ON EMMI ALERT - General Discharge Day # 4 Date: 10/19/2018 Red Alert Reason: unfilled prescriptions.   Outreach attempt #1, successful.  Referral received from care management assistant as for above noted reason.  He was recently admitted to hospital for cellulitis and was also diagnosed with Covid 19 infection.  Per chart, he also has history of CAD, NSTEMI, HTN, pituitary adenoma, CKD, and hyperlipidemia.    Call placed to member, wife answered phone and state he was asleep and unable to come to phone.  She verified his identity, this care manager explained purpose of call and [facility] care management services.  She states she does not have time to talk as she is making several calls to manage his health care.  Offered [facility] resources, she again state she is overwhelmed and would prefer not be speak at this time.  She does report she is managing his care and has all of the member's prescriptions.  She denies any needs at this time but also state she need to take time and figure out what is exactly needed.  She agrees to have this care manager provide contact information and to call with questions.  Plan: RN CM will send successful outreach letter and follow up within the next 2 weeks.  If she continue to deny need will close case at that time.  Valente David, [place], MSN [place] [phone]

## 2019-10-23 DIAGNOSIS — U071 COVID-19: Secondary | ICD-10-CM | POA: Diagnosis not present

## 2019-10-23 DIAGNOSIS — I129 Hypertensive chronic kidney disease with stage 1 through stage 4 chronic kidney disease, or unspecified chronic kidney disease: Secondary | ICD-10-CM | POA: Diagnosis not present

## 2019-10-23 DIAGNOSIS — N1831 Chronic kidney disease, stage 3a: Secondary | ICD-10-CM | POA: Diagnosis not present

## 2019-10-23 DIAGNOSIS — G3184 Mild cognitive impairment, so stated: Secondary | ICD-10-CM | POA: Diagnosis not present

## 2019-10-23 DIAGNOSIS — I2699 Other pulmonary embolism without acute cor pulmonale: Secondary | ICD-10-CM | POA: Diagnosis not present

## 2019-10-23 DIAGNOSIS — I2510 Atherosclerotic heart disease of native coronary artery without angina pectoris: Secondary | ICD-10-CM | POA: Diagnosis not present

## 2019-10-23 DIAGNOSIS — L03116 Cellulitis of left lower limb: Secondary | ICD-10-CM | POA: Diagnosis not present

## 2019-11-04 ENCOUNTER — Other Ambulatory Visit: Payer: Self-pay | Admitting: *Deleted

## 2019-11-04 ENCOUNTER — Other Ambulatory Visit: Payer: Self-pay

## 2019-11-04 ENCOUNTER — Ambulatory Visit (INDEPENDENT_AMBULATORY_CARE_PROVIDER_SITE_OTHER): Payer: Medicare Other | Admitting: Pulmonary Disease

## 2019-11-04 DIAGNOSIS — R0600 Dyspnea, unspecified: Secondary | ICD-10-CM | POA: Diagnosis not present

## 2019-11-04 DIAGNOSIS — Z8616 Personal history of COVID-19: Secondary | ICD-10-CM | POA: Diagnosis not present

## 2019-11-04 DIAGNOSIS — R768 Other specified abnormal immunological findings in serum: Secondary | ICD-10-CM

## 2019-11-04 DIAGNOSIS — R0609 Other forms of dyspnea: Secondary | ICD-10-CM

## 2019-11-04 MED ORDER — MOMETASONE FURO-FORMOTEROL FUM 200-5 MCG/ACT IN AERO: 2.0000 | INHALATION_SPRAY | Freq: Two times a day (BID) | RESPIRATORY_TRACT | 6 refills | Status: DC

## 2019-11-04 NOTE — Progress Notes (Signed)
Virtual Visit via Telephone Note  I connected with [name], [name] on 11/04/19 at 11:15 AM EST by telephone and verified that I am speaking with the correct person using two identifiers.  Location: Patient: [name], [name] Provider: Garner Nash, DO   I discussed the limitations, risks, security and privacy concerns of performing an evaluation and management service by telephone and the availability of in person appointments. I also discussed with the patient that there may be a patient responsible charge related to this service. The patient expressed understanding and agreed to proceed.   History of Present Illness:  This is an 86 year old gentleman past medical history of cardiomyopathy, MI, stent placement, coronary artery disease, hyperlipidemia, follow-up by Dr. Aundra Dubin, primary care doctor [name].  Patient had recent hospitalization found to have asymptomatic COVID-19 plus lower extremity cellulitis.  Followed here in the pulmonary clinic with significant positive regional allergy panel, positive IgE to 15.  Always has chronic nasal symptoms as well as dyspnea.  Patient also had a prior lower extremity VTE and presumed PAD.  Was seen in the hospital several months ago for that and treated and discharged on Eliquis.  Today we discussed his recent hospitalization as well as his prior initiation of Dupixent.  He had received approximately 6 injections of Dupixent and due to cost and no significant response to symptoms patient declined any further injections.  This is the first time I was aware of him declining any further injections.  Does appear there is ongoing confusion regarding his inhalers.  This seems to be a recurrent theme and the fact that he routinely does not take his medications at home as prescribed.  His wife tries to help manage this but he did make his own choices of medications at home.  Right now his shortness of breath the patient tells me is stable and he does not  feel short of breath.  And his wife states this is because he rarely does anything throughout the day and spends most of the time in his chair or in the bed.   Observations/Objective: Most of the discussion today on the phone is with the patient's wife.  Occasionally the patient times then to state something opposite of what the patient's wife is saying.  And ultimately them going back and forth somewhat arguing with me just in the background.  Assessment and Plan: Chronic dyspnea on exertion Atopic allergies Seasonal allergies, environmental allergies Positive regional allergy panel, elevated IgE History of CAD, history of MI History of COVID-19 asymptomatic infection Recent hospitalization for cellulitis  history of VTE  Plan: New prescription given for [facility] which she was discharged from the hospital on. Continue antihistamines Continue albuterol as needed for shortness of breath and wheezing. Follow-up with primary care provider scheduled in March 2021  Follow Up Instructions: Return to clinic in 6 months or as needed based on symptoms.   I discussed the assessment and treatment plan with the patient. The patient was provided an opportunity to ask questions and all were answered. The patient agreed with the plan and demonstrated an understanding of the instructions.   The patient was advised to call back or seek an in-person evaluation if the symptoms worsen or if the condition fails to improve as anticipated.  I provided 22 minutes of non-face-to-face time during this encounter.   Garner Nash, DO

## 2019-11-04 NOTE — Patient Outreach (Signed)
[facility]) Care Management  11/04/2019  Olsen, [name] [birth year] [id number]   Call placed to member/caregiver to complete assessment of needs.  Wife report this is not a good time to talk, request call back later.  This care manager inquired about a best time to contact, she state "no, just later" and abruptly ends call.  Will make 3rd attempt within the next week to assess for needs.  If remain unsuccessful, will close case.  Valente David, [place], MSN [place] [phone]

## 2019-11-04 NOTE — Patient Instructions (Signed)
Thank you for visiting Dr. Valeta Harms at [facility]. Today we recommend the following:  Meds ordered this encounter  Medications  . mometasone-formoterol (DULERA) 200-5 MCG/ACT AERO    Sig: Inhale 2 puffs into the lungs 2 (two) times daily.    Dispense:  13 g    Refill:  6   Return in about 6 months (around 05/03/2020).    Please do your part to reduce the spread of COVID-19.

## 2019-11-12 ENCOUNTER — Other Ambulatory Visit: Payer: Self-pay | Admitting: *Deleted

## 2019-11-12 NOTE — Patient Outreach (Signed)
[facility]) Care Management  11/12/2019  [place], [name] 08/17/1934 [id number]   Third attempt made to contact member to discuss [facility] services and attempt engagement.  Wife answered phone and stated member was not available.  This care manager attempted to discuss engagement with wife, she state she does not have time to talk and abruptly ends call.  Will close case at this time as this is the 3rd consecutive unsuccessful attempt to engage member with [facility].  Will notify PCP of case closure.  Valente David, [place], MSN [place] [phone]

## 2019-11-13 ENCOUNTER — Telehealth: Payer: Self-pay

## 2019-11-13 DIAGNOSIS — E291 Testicular hypofunction: Secondary | ICD-10-CM | POA: Diagnosis not present

## 2019-11-13 NOTE — Telephone Encounter (Signed)
Received fax from [facility], PA has been denied for [facility]. Pt must try and fail Advair before Ruthe Mannan will be covered.    Dr. Valeta Harms please advise if ok to switch to Advair per insurance request.  Thanks!

## 2019-11-13 NOTE — Telephone Encounter (Signed)
PA request was received from [place] Fax: [phone] Medication name and strength: Dulera 200-5MCG Ordering Provider: Dr.Icard  Was PA started with CMM?: no If yes, please enter KEY: n/a Medication tried and failed: Combivent,Pulmicort,Symbicort  Covered Alternatives:   PA sent to plan, time frame for approval / denial:        For f/u Contact :[phone]

## 2019-11-16 NOTE — Telephone Encounter (Signed)
OK for Advair HFA high dose, but not diskus. Patient has failed other DPIs in including advair disckus, flovent and breo  [name] Nash, DO [place] [facility] 11/16/2019 10:23 AM

## 2019-11-17 ENCOUNTER — Other Ambulatory Visit: Payer: Self-pay

## 2019-11-17 ENCOUNTER — Telehealth: Payer: Self-pay

## 2019-11-17 MED ORDER — ADVAIR HFA 230-21 MCG/ACT IN AERO: 2.0000 | INHALATION_SPRAY | Freq: Two times a day (BID) | RESPIRATORY_TRACT | 12 refills | Status: DC

## 2019-11-17 MED ORDER — FLUTICASONE PROPIONATE 50 MCG/ACT NA SUSP: 2.0000 | Freq: Every day | NASAL | 5 refills | Status: DC

## 2019-11-17 NOTE — Telephone Encounter (Signed)
Spoke with patient's wife regarding his Advair [facility] sent into his pharmacy . Patient's wife was wondering if Mr.Downard could get a refill on his Fluticasone Nasal spray.Dr. Valeta Harms can you please advise . Thank you

## 2019-11-17 NOTE — Telephone Encounter (Deleted)
Spoke with patient's wife regarding his Advair [facility] sent into his pharmacy . Patient's wife was wondering if [name] could get a refill on his Fluticasone Nasal spray.Dr. Valeta Harms can you please advise . Thank you

## 2019-11-17 NOTE — Telephone Encounter (Signed)
Rx has been sent in. Pt's wife is aware. Nothing further was needed. 

## 2019-11-17 NOTE — Telephone Encounter (Signed)
PCCM:  Yes please refill  Garner Nash, DO [place] [facility] 11/17/2019 4:44 PM

## 2019-11-17 NOTE — Telephone Encounter (Signed)
Per Dr.Icard ok to send in a script for Advair HFA high dose per patient's insurance would cover medication until patient has tried and failed for them to cover [facility].Patient was notified. Nothing else further needed.

## 2019-12-08 DIAGNOSIS — H35373 Puckering of macula, bilateral: Secondary | ICD-10-CM | POA: Diagnosis not present

## 2019-12-08 DIAGNOSIS — H353131 Nonexudative age-related macular degeneration, bilateral, early dry stage: Secondary | ICD-10-CM | POA: Diagnosis not present

## 2019-12-11 DIAGNOSIS — N1831 Chronic kidney disease, stage 3a: Secondary | ICD-10-CM | POA: Diagnosis not present

## 2019-12-11 DIAGNOSIS — R296 Repeated falls: Secondary | ICD-10-CM | POA: Diagnosis not present

## 2019-12-11 DIAGNOSIS — R2689 Other abnormalities of gait and mobility: Secondary | ICD-10-CM | POA: Diagnosis not present

## 2019-12-11 DIAGNOSIS — I129 Hypertensive chronic kidney disease with stage 1 through stage 4 chronic kidney disease, or unspecified chronic kidney disease: Secondary | ICD-10-CM | POA: Diagnosis not present

## 2019-12-11 DIAGNOSIS — Z0289 Encounter for other administrative examinations: Secondary | ICD-10-CM | POA: Diagnosis not present

## 2019-12-11 DIAGNOSIS — G3184 Mild cognitive impairment, so stated: Secondary | ICD-10-CM | POA: Diagnosis not present

## 2019-12-14 ENCOUNTER — Telehealth (HOSPITAL_COMMUNITY): Payer: Self-pay

## 2019-12-14 NOTE — Telephone Encounter (Signed)
[employer] driving form completed and signed by MD. Left at front desk for patient to pick up.  Patient and wife made aware of same.

## 2019-12-15 DIAGNOSIS — E291 Testicular hypofunction: Secondary | ICD-10-CM | POA: Diagnosis not present

## 2019-12-16 DIAGNOSIS — E2740 Unspecified adrenocortical insufficiency: Secondary | ICD-10-CM | POA: Diagnosis not present

## 2019-12-16 DIAGNOSIS — R2689 Other abnormalities of gait and mobility: Secondary | ICD-10-CM | POA: Diagnosis not present

## 2019-12-16 DIAGNOSIS — I119 Hypertensive heart disease without heart failure: Secondary | ICD-10-CM | POA: Diagnosis not present

## 2019-12-16 DIAGNOSIS — R110 Nausea: Secondary | ICD-10-CM | POA: Diagnosis not present

## 2019-12-16 DIAGNOSIS — R531 Weakness: Secondary | ICD-10-CM | POA: Diagnosis not present

## 2019-12-16 DIAGNOSIS — K5900 Constipation, unspecified: Secondary | ICD-10-CM | POA: Diagnosis not present

## 2019-12-22 ENCOUNTER — Other Ambulatory Visit (HOSPITAL_COMMUNITY): Payer: Self-pay

## 2019-12-22 DIAGNOSIS — J45909 Unspecified asthma, uncomplicated: Secondary | ICD-10-CM | POA: Diagnosis not present

## 2019-12-22 DIAGNOSIS — G3184 Mild cognitive impairment, so stated: Secondary | ICD-10-CM | POA: Diagnosis not present

## 2019-12-22 DIAGNOSIS — R531 Weakness: Secondary | ICD-10-CM | POA: Diagnosis not present

## 2019-12-22 DIAGNOSIS — I119 Hypertensive heart disease without heart failure: Secondary | ICD-10-CM | POA: Diagnosis not present

## 2019-12-22 DIAGNOSIS — I2699 Other pulmonary embolism without acute cor pulmonale: Secondary | ICD-10-CM | POA: Diagnosis not present

## 2019-12-22 DIAGNOSIS — R7301 Impaired fasting glucose: Secondary | ICD-10-CM | POA: Diagnosis not present

## 2019-12-22 DIAGNOSIS — D649 Anemia, unspecified: Secondary | ICD-10-CM | POA: Diagnosis not present

## 2019-12-22 DIAGNOSIS — I2510 Atherosclerotic heart disease of native coronary artery without angina pectoris: Secondary | ICD-10-CM | POA: Diagnosis not present

## 2019-12-22 DIAGNOSIS — R2689 Other abnormalities of gait and mobility: Secondary | ICD-10-CM | POA: Diagnosis not present

## 2019-12-22 DIAGNOSIS — Z8616 Personal history of COVID-19: Secondary | ICD-10-CM | POA: Diagnosis not present

## 2019-12-22 DIAGNOSIS — I739 Peripheral vascular disease, unspecified: Secondary | ICD-10-CM | POA: Diagnosis not present

## 2019-12-22 DIAGNOSIS — I129 Hypertensive chronic kidney disease with stage 1 through stage 4 chronic kidney disease, or unspecified chronic kidney disease: Secondary | ICD-10-CM | POA: Diagnosis not present

## 2019-12-22 MED ORDER — NEBIVOLOL HCL 2.5 MG PO TABS: ORAL_TABLET | ORAL | 1 refills | Status: DC

## 2020-01-04 ENCOUNTER — Telehealth (HOSPITAL_COMMUNITY): Payer: Self-pay | Admitting: Pharmacist

## 2020-01-04 NOTE — Telephone Encounter (Signed)
Patient Advocate Encounter   Received notification from [facility] that prior authorization for Bystolic is required.   PA submitted on CoverMyMeds Key [id number] Status is pending   Will continue to follow.  Audry Riles, PharmD, BCPS, BCCP, CPP Heart Failure Clinic Pharmacist [phone]

## 2020-01-05 DIAGNOSIS — E291 Testicular hypofunction: Secondary | ICD-10-CM | POA: Diagnosis not present

## 2020-01-05 NOTE — Telephone Encounter (Signed)
Advanced Heart Failure Patient Advocate Encounter  Prior Authorization for Bystolic has been approved.    Effective dates: 01/04/2020 through 01/03/2021.   Audry Riles, PharmD, BCPS, BCCP, CPP Heart Failure Clinic Pharmacist [phone]

## 2020-01-14 ENCOUNTER — Ambulatory Visit: Payer: Medicare Other | Attending: Internal Medicine

## 2020-01-14 DIAGNOSIS — Z23 Encounter for immunization: Secondary | ICD-10-CM

## 2020-01-14 NOTE — Progress Notes (Signed)
   Covid-19 Vaccination Clinic  Name:  [name], [name]    MRN: [id number] DOB: 05-11-1934  01/14/2020  [name] was observed post Covid-19 immunization for 15 minutes without incident. He was provided with Vaccine Information Sheet and instruction to access the V-Safe system.   [name] was instructed to call 911 with any severe reactions post vaccine: Marland Kitchen Difficulty breathing  . Swelling of face and throat  . A fast heartbeat  . A bad rash all over body  . Dizziness and weakness   Immunizations Administered    Name Date Dose VIS Date Route   Pfizer COVID-19 Vaccine 01/14/2020 10:11 AM 0.3 mL 09/11/2019 Intramuscular   Manufacturer: [place]   Lot: [id number]   [place]: [id number]

## 2020-01-26 DIAGNOSIS — E291 Testicular hypofunction: Secondary | ICD-10-CM | POA: Diagnosis not present

## 2020-01-27 DIAGNOSIS — R262 Difficulty in walking, not elsewhere classified: Secondary | ICD-10-CM | POA: Diagnosis not present

## 2020-01-27 DIAGNOSIS — M6281 Muscle weakness (generalized): Secondary | ICD-10-CM | POA: Diagnosis not present

## 2020-01-28 DIAGNOSIS — R262 Difficulty in walking, not elsewhere classified: Secondary | ICD-10-CM | POA: Diagnosis not present

## 2020-01-28 DIAGNOSIS — M6281 Muscle weakness (generalized): Secondary | ICD-10-CM | POA: Diagnosis not present

## 2020-02-01 DIAGNOSIS — E2740 Unspecified adrenocortical insufficiency: Secondary | ICD-10-CM | POA: Diagnosis not present

## 2020-02-01 DIAGNOSIS — R2689 Other abnormalities of gait and mobility: Secondary | ICD-10-CM | POA: Diagnosis not present

## 2020-02-01 DIAGNOSIS — I2510 Atherosclerotic heart disease of native coronary artery without angina pectoris: Secondary | ICD-10-CM | POA: Diagnosis not present

## 2020-02-01 DIAGNOSIS — I2699 Other pulmonary embolism without acute cor pulmonale: Secondary | ICD-10-CM | POA: Diagnosis not present

## 2020-02-01 DIAGNOSIS — I10 Essential (primary) hypertension: Secondary | ICD-10-CM | POA: Diagnosis not present

## 2020-02-01 DIAGNOSIS — Z8616 Personal history of COVID-19: Secondary | ICD-10-CM | POA: Diagnosis not present

## 2020-02-02 DIAGNOSIS — M6281 Muscle weakness (generalized): Secondary | ICD-10-CM | POA: Diagnosis not present

## 2020-02-02 DIAGNOSIS — R262 Difficulty in walking, not elsewhere classified: Secondary | ICD-10-CM | POA: Diagnosis not present

## 2020-02-04 DIAGNOSIS — M6281 Muscle weakness (generalized): Secondary | ICD-10-CM | POA: Diagnosis not present

## 2020-02-04 DIAGNOSIS — R262 Difficulty in walking, not elsewhere classified: Secondary | ICD-10-CM | POA: Diagnosis not present

## 2020-02-08 ENCOUNTER — Ambulatory Visit: Payer: Medicare Other | Attending: Internal Medicine

## 2020-02-08 DIAGNOSIS — K5900 Constipation, unspecified: Secondary | ICD-10-CM | POA: Diagnosis not present

## 2020-02-08 DIAGNOSIS — I2510 Atherosclerotic heart disease of native coronary artery without angina pectoris: Secondary | ICD-10-CM | POA: Diagnosis not present

## 2020-02-08 DIAGNOSIS — R413 Other amnesia: Secondary | ICD-10-CM | POA: Diagnosis not present

## 2020-02-08 DIAGNOSIS — Z79899 Other long term (current) drug therapy: Secondary | ICD-10-CM | POA: Diagnosis not present

## 2020-02-08 DIAGNOSIS — Z8616 Personal history of COVID-19: Secondary | ICD-10-CM | POA: Diagnosis not present

## 2020-02-08 DIAGNOSIS — I119 Hypertensive heart disease without heart failure: Secondary | ICD-10-CM | POA: Diagnosis not present

## 2020-02-08 DIAGNOSIS — I2699 Other pulmonary embolism without acute cor pulmonale: Secondary | ICD-10-CM | POA: Diagnosis not present

## 2020-02-08 DIAGNOSIS — R2689 Other abnormalities of gait and mobility: Secondary | ICD-10-CM | POA: Diagnosis not present

## 2020-02-08 DIAGNOSIS — E2740 Unspecified adrenocortical insufficiency: Secondary | ICD-10-CM | POA: Diagnosis not present

## 2020-02-09 DIAGNOSIS — M6281 Muscle weakness (generalized): Secondary | ICD-10-CM | POA: Diagnosis not present

## 2020-02-09 DIAGNOSIS — R262 Difficulty in walking, not elsewhere classified: Secondary | ICD-10-CM | POA: Diagnosis not present

## 2020-02-10 ENCOUNTER — Other Ambulatory Visit (HOSPITAL_COMMUNITY): Payer: Self-pay

## 2020-02-10 MED ORDER — EZETIMIBE 10 MG PO TABS: ORAL_TABLET | ORAL | 3 refills | Status: DC

## 2020-02-11 DIAGNOSIS — R262 Difficulty in walking, not elsewhere classified: Secondary | ICD-10-CM | POA: Diagnosis not present

## 2020-02-11 DIAGNOSIS — M6281 Muscle weakness (generalized): Secondary | ICD-10-CM | POA: Diagnosis not present

## 2020-02-16 DIAGNOSIS — E291 Testicular hypofunction: Secondary | ICD-10-CM | POA: Diagnosis not present

## 2020-02-16 DIAGNOSIS — R262 Difficulty in walking, not elsewhere classified: Secondary | ICD-10-CM | POA: Diagnosis not present

## 2020-02-16 DIAGNOSIS — M6281 Muscle weakness (generalized): Secondary | ICD-10-CM | POA: Diagnosis not present

## 2020-02-18 DIAGNOSIS — M6281 Muscle weakness (generalized): Secondary | ICD-10-CM | POA: Diagnosis not present

## 2020-02-18 DIAGNOSIS — R262 Difficulty in walking, not elsewhere classified: Secondary | ICD-10-CM | POA: Diagnosis not present

## 2020-02-22 ENCOUNTER — Other Ambulatory Visit: Payer: Self-pay | Admitting: Internal Medicine

## 2020-02-25 DIAGNOSIS — M6281 Muscle weakness (generalized): Secondary | ICD-10-CM | POA: Diagnosis not present

## 2020-02-25 DIAGNOSIS — R262 Difficulty in walking, not elsewhere classified: Secondary | ICD-10-CM | POA: Diagnosis not present

## 2020-02-26 ENCOUNTER — Other Ambulatory Visit: Payer: Self-pay

## 2020-03-02 DIAGNOSIS — R262 Difficulty in walking, not elsewhere classified: Secondary | ICD-10-CM | POA: Diagnosis not present

## 2020-03-02 DIAGNOSIS — M6281 Muscle weakness (generalized): Secondary | ICD-10-CM | POA: Diagnosis not present

## 2020-03-04 DIAGNOSIS — R262 Difficulty in walking, not elsewhere classified: Secondary | ICD-10-CM | POA: Diagnosis not present

## 2020-03-04 DIAGNOSIS — M6281 Muscle weakness (generalized): Secondary | ICD-10-CM | POA: Diagnosis not present

## 2020-03-08 DIAGNOSIS — R262 Difficulty in walking, not elsewhere classified: Secondary | ICD-10-CM | POA: Diagnosis not present

## 2020-03-08 DIAGNOSIS — E291 Testicular hypofunction: Secondary | ICD-10-CM | POA: Diagnosis not present

## 2020-03-08 DIAGNOSIS — M6281 Muscle weakness (generalized): Secondary | ICD-10-CM | POA: Diagnosis not present

## 2020-03-10 DIAGNOSIS — R262 Difficulty in walking, not elsewhere classified: Secondary | ICD-10-CM | POA: Diagnosis not present

## 2020-03-10 DIAGNOSIS — M6281 Muscle weakness (generalized): Secondary | ICD-10-CM | POA: Diagnosis not present

## 2020-03-15 ENCOUNTER — Other Ambulatory Visit (HOSPITAL_COMMUNITY): Payer: Self-pay | Admitting: Cardiology

## 2020-03-15 DIAGNOSIS — M6281 Muscle weakness (generalized): Secondary | ICD-10-CM | POA: Diagnosis not present

## 2020-03-15 DIAGNOSIS — R262 Difficulty in walking, not elsewhere classified: Secondary | ICD-10-CM | POA: Diagnosis not present

## 2020-03-15 MED ORDER — APIXABAN 2.5 MG PO TABS: 2.5000 mg | ORAL_TABLET | Freq: Two times a day (BID) | ORAL | 1 refills | Status: DC

## 2020-03-17 DIAGNOSIS — M6281 Muscle weakness (generalized): Secondary | ICD-10-CM | POA: Diagnosis not present

## 2020-03-17 DIAGNOSIS — R262 Difficulty in walking, not elsewhere classified: Secondary | ICD-10-CM | POA: Diagnosis not present

## 2020-03-23 ENCOUNTER — Other Ambulatory Visit: Payer: Self-pay | Admitting: Internal Medicine

## 2020-04-13 DIAGNOSIS — M6281 Muscle weakness (generalized): Secondary | ICD-10-CM | POA: Diagnosis not present

## 2020-04-13 DIAGNOSIS — R262 Difficulty in walking, not elsewhere classified: Secondary | ICD-10-CM | POA: Diagnosis not present

## 2020-04-13 DIAGNOSIS — J45909 Unspecified asthma, uncomplicated: Secondary | ICD-10-CM | POA: Diagnosis not present

## 2020-04-13 DIAGNOSIS — I2699 Other pulmonary embolism without acute cor pulmonale: Secondary | ICD-10-CM | POA: Diagnosis not present

## 2020-04-13 DIAGNOSIS — R2689 Other abnormalities of gait and mobility: Secondary | ICD-10-CM | POA: Diagnosis not present

## 2020-04-13 DIAGNOSIS — R531 Weakness: Secondary | ICD-10-CM | POA: Diagnosis not present

## 2020-04-13 DIAGNOSIS — I119 Hypertensive heart disease without heart failure: Secondary | ICD-10-CM | POA: Diagnosis not present

## 2020-04-13 DIAGNOSIS — I739 Peripheral vascular disease, unspecified: Secondary | ICD-10-CM | POA: Diagnosis not present

## 2020-04-13 DIAGNOSIS — I2510 Atherosclerotic heart disease of native coronary artery without angina pectoris: Secondary | ICD-10-CM | POA: Diagnosis not present

## 2020-04-13 DIAGNOSIS — I255 Ischemic cardiomyopathy: Secondary | ICD-10-CM | POA: Diagnosis not present

## 2020-04-13 DIAGNOSIS — D649 Anemia, unspecified: Secondary | ICD-10-CM | POA: Diagnosis not present

## 2020-04-13 DIAGNOSIS — E2740 Unspecified adrenocortical insufficiency: Secondary | ICD-10-CM | POA: Diagnosis not present

## 2020-04-13 DIAGNOSIS — R7301 Impaired fasting glucose: Secondary | ICD-10-CM | POA: Diagnosis not present

## 2020-04-15 DIAGNOSIS — R262 Difficulty in walking, not elsewhere classified: Secondary | ICD-10-CM | POA: Diagnosis not present

## 2020-04-15 DIAGNOSIS — M6281 Muscle weakness (generalized): Secondary | ICD-10-CM | POA: Diagnosis not present

## 2020-04-19 DIAGNOSIS — R262 Difficulty in walking, not elsewhere classified: Secondary | ICD-10-CM | POA: Diagnosis not present

## 2020-04-19 DIAGNOSIS — M6281 Muscle weakness (generalized): Secondary | ICD-10-CM | POA: Diagnosis not present

## 2020-04-21 ENCOUNTER — Other Ambulatory Visit: Payer: Self-pay | Admitting: Internal Medicine

## 2020-04-21 DIAGNOSIS — E291 Testicular hypofunction: Secondary | ICD-10-CM | POA: Diagnosis not present

## 2020-04-22 DIAGNOSIS — M6281 Muscle weakness (generalized): Secondary | ICD-10-CM | POA: Diagnosis not present

## 2020-04-22 DIAGNOSIS — R262 Difficulty in walking, not elsewhere classified: Secondary | ICD-10-CM | POA: Diagnosis not present

## 2020-04-25 DIAGNOSIS — M6281 Muscle weakness (generalized): Secondary | ICD-10-CM | POA: Diagnosis not present

## 2020-04-25 DIAGNOSIS — R262 Difficulty in walking, not elsewhere classified: Secondary | ICD-10-CM | POA: Diagnosis not present

## 2020-05-02 DIAGNOSIS — R262 Difficulty in walking, not elsewhere classified: Secondary | ICD-10-CM | POA: Diagnosis not present

## 2020-05-02 DIAGNOSIS — M6281 Muscle weakness (generalized): Secondary | ICD-10-CM | POA: Diagnosis not present

## 2020-05-04 DIAGNOSIS — M6281 Muscle weakness (generalized): Secondary | ICD-10-CM | POA: Diagnosis not present

## 2020-05-04 DIAGNOSIS — R262 Difficulty in walking, not elsewhere classified: Secondary | ICD-10-CM | POA: Diagnosis not present

## 2020-05-09 ENCOUNTER — Other Ambulatory Visit (HOSPITAL_COMMUNITY): Payer: Self-pay | Admitting: Cardiology

## 2020-05-09 DIAGNOSIS — R262 Difficulty in walking, not elsewhere classified: Secondary | ICD-10-CM | POA: Diagnosis not present

## 2020-05-09 DIAGNOSIS — M6281 Muscle weakness (generalized): Secondary | ICD-10-CM | POA: Diagnosis not present

## 2020-05-10 DIAGNOSIS — E230 Hypopituitarism: Secondary | ICD-10-CM | POA: Diagnosis not present

## 2020-05-11 DIAGNOSIS — M6281 Muscle weakness (generalized): Secondary | ICD-10-CM | POA: Diagnosis not present

## 2020-05-11 DIAGNOSIS — R262 Difficulty in walking, not elsewhere classified: Secondary | ICD-10-CM | POA: Diagnosis not present

## 2020-05-16 DIAGNOSIS — M6281 Muscle weakness (generalized): Secondary | ICD-10-CM | POA: Diagnosis not present

## 2020-05-16 DIAGNOSIS — R262 Difficulty in walking, not elsewhere classified: Secondary | ICD-10-CM | POA: Diagnosis not present

## 2020-05-18 DIAGNOSIS — M6281 Muscle weakness (generalized): Secondary | ICD-10-CM | POA: Diagnosis not present

## 2020-05-18 DIAGNOSIS — R262 Difficulty in walking, not elsewhere classified: Secondary | ICD-10-CM | POA: Diagnosis not present

## 2020-05-23 DIAGNOSIS — R262 Difficulty in walking, not elsewhere classified: Secondary | ICD-10-CM | POA: Diagnosis not present

## 2020-05-23 DIAGNOSIS — M6281 Muscle weakness (generalized): Secondary | ICD-10-CM | POA: Diagnosis not present

## 2020-05-27 DIAGNOSIS — R262 Difficulty in walking, not elsewhere classified: Secondary | ICD-10-CM | POA: Diagnosis not present

## 2020-05-30 DIAGNOSIS — R262 Difficulty in walking, not elsewhere classified: Secondary | ICD-10-CM | POA: Diagnosis not present

## 2020-05-30 DIAGNOSIS — M6281 Muscle weakness (generalized): Secondary | ICD-10-CM | POA: Diagnosis not present

## 2020-05-31 DIAGNOSIS — E291 Testicular hypofunction: Secondary | ICD-10-CM | POA: Diagnosis not present

## 2020-06-01 DIAGNOSIS — M6281 Muscle weakness (generalized): Secondary | ICD-10-CM | POA: Diagnosis not present

## 2020-06-01 DIAGNOSIS — R262 Difficulty in walking, not elsewhere classified: Secondary | ICD-10-CM | POA: Diagnosis not present

## 2020-06-07 ENCOUNTER — Other Ambulatory Visit: Payer: Self-pay | Admitting: *Deleted

## 2020-06-07 MED ORDER — ALBUTEROL SULFATE HFA 108 (90 BASE) MCG/ACT IN AERS: 2.0000 | INHALATION_SPRAY | Freq: Four times a day (QID) | RESPIRATORY_TRACT | 0 refills | Status: DC | PRN

## 2020-06-07 MED ORDER — FLUTICASONE PROPIONATE 50 MCG/ACT NA SUSP: 2.0000 | Freq: Every day | NASAL | 5 refills | Status: DC

## 2020-06-13 DIAGNOSIS — R262 Difficulty in walking, not elsewhere classified: Secondary | ICD-10-CM | POA: Diagnosis not present

## 2020-06-13 DIAGNOSIS — M6281 Muscle weakness (generalized): Secondary | ICD-10-CM | POA: Diagnosis not present

## 2020-06-15 DIAGNOSIS — R262 Difficulty in walking, not elsewhere classified: Secondary | ICD-10-CM | POA: Diagnosis not present

## 2020-06-15 DIAGNOSIS — M6281 Muscle weakness (generalized): Secondary | ICD-10-CM | POA: Diagnosis not present

## 2020-06-21 DIAGNOSIS — E291 Testicular hypofunction: Secondary | ICD-10-CM | POA: Diagnosis not present

## 2020-07-04 ENCOUNTER — Other Ambulatory Visit (HOSPITAL_COMMUNITY): Payer: Self-pay | Admitting: Cardiology

## 2020-07-05 DIAGNOSIS — Z0000 Encounter for general adult medical examination without abnormal findings: Secondary | ICD-10-CM | POA: Diagnosis not present

## 2020-07-05 DIAGNOSIS — E039 Hypothyroidism, unspecified: Secondary | ICD-10-CM | POA: Diagnosis not present

## 2020-07-05 DIAGNOSIS — R7301 Impaired fasting glucose: Secondary | ICD-10-CM | POA: Diagnosis not present

## 2020-07-05 DIAGNOSIS — Z125 Encounter for screening for malignant neoplasm of prostate: Secondary | ICD-10-CM | POA: Diagnosis not present

## 2020-07-05 DIAGNOSIS — E559 Vitamin D deficiency, unspecified: Secondary | ICD-10-CM | POA: Diagnosis not present

## 2020-07-05 DIAGNOSIS — E291 Testicular hypofunction: Secondary | ICD-10-CM | POA: Diagnosis not present

## 2020-07-05 DIAGNOSIS — I10 Essential (primary) hypertension: Secondary | ICD-10-CM | POA: Diagnosis not present

## 2020-07-12 DIAGNOSIS — I82401 Acute embolism and thrombosis of unspecified deep veins of right lower extremity: Secondary | ICD-10-CM | POA: Diagnosis not present

## 2020-07-12 DIAGNOSIS — E039 Hypothyroidism, unspecified: Secondary | ICD-10-CM | POA: Diagnosis not present

## 2020-07-12 DIAGNOSIS — I129 Hypertensive chronic kidney disease with stage 1 through stage 4 chronic kidney disease, or unspecified chronic kidney disease: Secondary | ICD-10-CM | POA: Diagnosis not present

## 2020-07-12 DIAGNOSIS — N1831 Chronic kidney disease, stage 3a: Secondary | ICD-10-CM | POA: Diagnosis not present

## 2020-07-12 DIAGNOSIS — R82998 Other abnormal findings in urine: Secondary | ICD-10-CM | POA: Diagnosis not present

## 2020-07-12 DIAGNOSIS — I255 Ischemic cardiomyopathy: Secondary | ICD-10-CM | POA: Diagnosis not present

## 2020-07-12 DIAGNOSIS — R972 Elevated prostate specific antigen [PSA]: Secondary | ICD-10-CM | POA: Diagnosis not present

## 2020-07-12 DIAGNOSIS — E230 Hypopituitarism: Secondary | ICD-10-CM | POA: Diagnosis not present

## 2020-07-12 DIAGNOSIS — Z0000 Encounter for general adult medical examination without abnormal findings: Secondary | ICD-10-CM | POA: Diagnosis not present

## 2020-07-12 DIAGNOSIS — E2749 Other adrenocortical insufficiency: Secondary | ICD-10-CM | POA: Diagnosis not present

## 2020-07-12 DIAGNOSIS — Z23 Encounter for immunization: Secondary | ICD-10-CM | POA: Diagnosis not present

## 2020-07-12 DIAGNOSIS — E291 Testicular hypofunction: Secondary | ICD-10-CM | POA: Diagnosis not present

## 2020-07-12 DIAGNOSIS — I739 Peripheral vascular disease, unspecified: Secondary | ICD-10-CM | POA: Diagnosis not present

## 2020-07-22 ENCOUNTER — Other Ambulatory Visit (HOSPITAL_COMMUNITY): Payer: Self-pay | Admitting: *Deleted

## 2020-07-22 MED ORDER — CARVEDILOL 12.5 MG PO TABS: 12.5000 mg | ORAL_TABLET | Freq: Two times a day (BID) | ORAL | 11 refills | Status: DC

## 2020-08-02 DIAGNOSIS — H52203 Unspecified astigmatism, bilateral: Secondary | ICD-10-CM | POA: Diagnosis not present

## 2020-08-02 DIAGNOSIS — E291 Testicular hypofunction: Secondary | ICD-10-CM | POA: Diagnosis not present

## 2020-08-02 DIAGNOSIS — H4720 Unspecified optic atrophy: Secondary | ICD-10-CM | POA: Diagnosis not present

## 2020-08-02 DIAGNOSIS — H353131 Nonexudative age-related macular degeneration, bilateral, early dry stage: Secondary | ICD-10-CM | POA: Diagnosis not present

## 2020-08-03 DIAGNOSIS — M6281 Muscle weakness (generalized): Secondary | ICD-10-CM | POA: Diagnosis not present

## 2020-08-03 DIAGNOSIS — R2689 Other abnormalities of gait and mobility: Secondary | ICD-10-CM | POA: Diagnosis not present

## 2020-08-03 DIAGNOSIS — Z1212 Encounter for screening for malignant neoplasm of rectum: Secondary | ICD-10-CM | POA: Diagnosis not present

## 2020-08-03 DIAGNOSIS — Z1211 Encounter for screening for malignant neoplasm of colon: Secondary | ICD-10-CM | POA: Diagnosis not present

## 2020-08-11 DIAGNOSIS — I959 Hypotension, unspecified: Secondary | ICD-10-CM | POA: Diagnosis not present

## 2020-08-11 DIAGNOSIS — R2689 Other abnormalities of gait and mobility: Secondary | ICD-10-CM | POA: Diagnosis not present

## 2020-08-11 DIAGNOSIS — R001 Bradycardia, unspecified: Secondary | ICD-10-CM | POA: Diagnosis not present

## 2020-08-11 DIAGNOSIS — M6281 Muscle weakness (generalized): Secondary | ICD-10-CM | POA: Diagnosis not present

## 2020-08-11 DIAGNOSIS — R42 Dizziness and giddiness: Secondary | ICD-10-CM | POA: Diagnosis not present

## 2020-08-12 ENCOUNTER — Telehealth (HOSPITAL_COMMUNITY): Payer: Self-pay | Admitting: Cardiology

## 2020-08-12 MED ORDER — POTASSIUM CHLORIDE ER 10 MEQ PO TBCR: 20.0000 meq | EXTENDED_RELEASE_TABLET | Freq: Every day | ORAL | 3 refills | Status: DC

## 2020-08-12 NOTE — Telephone Encounter (Signed)
Pt request potassium chloride refill, please send script to [facility] [address], pt scheduled appt in Jan. Thanks

## 2020-08-17 DIAGNOSIS — M6281 Muscle weakness (generalized): Secondary | ICD-10-CM | POA: Diagnosis not present

## 2020-08-17 DIAGNOSIS — R2689 Other abnormalities of gait and mobility: Secondary | ICD-10-CM | POA: Diagnosis not present

## 2020-08-23 DIAGNOSIS — E291 Testicular hypofunction: Secondary | ICD-10-CM | POA: Diagnosis not present

## 2020-08-24 DIAGNOSIS — R2689 Other abnormalities of gait and mobility: Secondary | ICD-10-CM | POA: Diagnosis not present

## 2020-08-24 DIAGNOSIS — M6281 Muscle weakness (generalized): Secondary | ICD-10-CM | POA: Diagnosis not present

## 2020-08-29 ENCOUNTER — Telehealth (HOSPITAL_COMMUNITY): Payer: Self-pay | Admitting: Cardiology

## 2020-08-29 ENCOUNTER — Telehealth: Payer: Self-pay | Admitting: Pulmonary Disease

## 2020-08-29 MED ORDER — RANOLAZINE ER 500 MG PO TB12: 500.0000 mg | ORAL_TABLET | Freq: Two times a day (BID) | ORAL | 2 refills | Status: DC

## 2020-08-29 NOTE — Telephone Encounter (Signed)
Done

## 2020-08-29 NOTE — Telephone Encounter (Signed)
Pt request ranolazine refill, please send script to [facility], pt can be reached [phone] Thanks

## 2020-08-30 MED ORDER — ALBUTEROL SULFATE HFA 108 (90 BASE) MCG/ACT IN AERS: 2.0000 | INHALATION_SPRAY | Freq: Four times a day (QID) | RESPIRATORY_TRACT | 0 refills | Status: DC | PRN

## 2020-08-30 NOTE — Telephone Encounter (Signed)
Called and spoke to pt's spouse, [name]. She states pt is needing a new script for his albuterol hfa. Script sent to preferred pharmacy and appt made for pt in 10/2020 as he was due for his 65 month ROV. Pt verbalized understanding and denied any further questions or concerns at this time.

## 2020-08-31 ENCOUNTER — Other Ambulatory Visit (HOSPITAL_COMMUNITY): Payer: Self-pay | Admitting: Cardiology

## 2020-08-31 DIAGNOSIS — M6281 Muscle weakness (generalized): Secondary | ICD-10-CM | POA: Diagnosis not present

## 2020-08-31 DIAGNOSIS — R2689 Other abnormalities of gait and mobility: Secondary | ICD-10-CM | POA: Diagnosis not present

## 2020-09-01 ENCOUNTER — Other Ambulatory Visit (HOSPITAL_COMMUNITY): Payer: Self-pay | Admitting: Cardiology

## 2020-09-01 ENCOUNTER — Other Ambulatory Visit: Payer: Self-pay | Admitting: Internal Medicine

## 2020-09-01 DIAGNOSIS — E291 Testicular hypofunction: Secondary | ICD-10-CM | POA: Diagnosis not present

## 2020-09-06 DIAGNOSIS — R2689 Other abnormalities of gait and mobility: Secondary | ICD-10-CM | POA: Diagnosis not present

## 2020-09-06 DIAGNOSIS — M6281 Muscle weakness (generalized): Secondary | ICD-10-CM | POA: Diagnosis not present

## 2020-09-08 ENCOUNTER — Other Ambulatory Visit (HOSPITAL_COMMUNITY): Payer: Self-pay | Admitting: Cardiology

## 2020-09-13 DIAGNOSIS — M6281 Muscle weakness (generalized): Secondary | ICD-10-CM | POA: Diagnosis not present

## 2020-09-13 DIAGNOSIS — R2689 Other abnormalities of gait and mobility: Secondary | ICD-10-CM | POA: Diagnosis not present

## 2020-09-20 DIAGNOSIS — M6281 Muscle weakness (generalized): Secondary | ICD-10-CM | POA: Diagnosis not present

## 2020-09-20 DIAGNOSIS — R2689 Other abnormalities of gait and mobility: Secondary | ICD-10-CM | POA: Diagnosis not present

## 2020-09-21 DIAGNOSIS — D1801 Hemangioma of skin and subcutaneous tissue: Secondary | ICD-10-CM | POA: Diagnosis not present

## 2020-09-21 DIAGNOSIS — Z85828 Personal history of other malignant neoplasm of skin: Secondary | ICD-10-CM | POA: Diagnosis not present

## 2020-09-21 DIAGNOSIS — Z85820 Personal history of malignant melanoma of skin: Secondary | ICD-10-CM | POA: Diagnosis not present

## 2020-09-21 DIAGNOSIS — L281 Prurigo nodularis: Secondary | ICD-10-CM | POA: Diagnosis not present

## 2020-09-21 DIAGNOSIS — E291 Testicular hypofunction: Secondary | ICD-10-CM | POA: Diagnosis not present

## 2020-09-21 DIAGNOSIS — L821 Other seborrheic keratosis: Secondary | ICD-10-CM | POA: Diagnosis not present

## 2020-09-27 DIAGNOSIS — M6281 Muscle weakness (generalized): Secondary | ICD-10-CM | POA: Diagnosis not present

## 2020-09-27 DIAGNOSIS — R2689 Other abnormalities of gait and mobility: Secondary | ICD-10-CM | POA: Diagnosis not present

## 2020-10-01 ENCOUNTER — Other Ambulatory Visit (HOSPITAL_COMMUNITY): Payer: Self-pay | Admitting: Cardiology

## 2020-10-10 ENCOUNTER — Other Ambulatory Visit: Payer: Self-pay

## 2020-10-10 ENCOUNTER — Ambulatory Visit (INDEPENDENT_AMBULATORY_CARE_PROVIDER_SITE_OTHER): Payer: Medicare Other | Admitting: Pulmonary Disease

## 2020-10-10 VITALS — BP 130/78 | HR 50 | Temp 97.0°F | Ht 73.5 in | Wt 245.1 lb

## 2020-10-10 DIAGNOSIS — I2694 Multiple subsegmental pulmonary emboli without acute cor pulmonale: Secondary | ICD-10-CM

## 2020-10-10 DIAGNOSIS — I8290 Acute embolism and thrombosis of unspecified vein: Secondary | ICD-10-CM

## 2020-10-10 DIAGNOSIS — R768 Other specified abnormal immunological findings in serum: Secondary | ICD-10-CM

## 2020-10-10 DIAGNOSIS — J4540 Moderate persistent asthma, uncomplicated: Secondary | ICD-10-CM

## 2020-10-10 DIAGNOSIS — T7849XA Other allergy, initial encounter: Secondary | ICD-10-CM

## 2020-10-10 MED ORDER — MONTELUKAST SODIUM 10 MG PO TABS: 10.0000 mg | ORAL_TABLET | Freq: Every day | ORAL | 11 refills | Status: DC

## 2020-10-10 NOTE — Patient Instructions (Addendum)
Thank you for visiting Dr. Valeta Harms at [facility]. Today we recommend the following:  Continue the Advair  Continue albuterol if needed  Meds ordered this encounter  Medications  . montelukast (SINGULAIR) 10 MG tablet    Sig: Take 1 tablet (10 mg total) by mouth at bedtime.    Dispense:  30 tablet    Refill:  11   Return in about 1 year (around 10/10/2021), or if symptoms worsen or fail to improve, for with APP or Dr. Valeta Harms.    Please do your part to reduce the spread of COVID-19.

## 2020-10-10 NOTE — Progress Notes (Signed)
Synopsis: Referred in Sept 2019 for DOE by [name], MD  Subjective:   PATIENT ID: [name], [name] GENDER: male DOB: Feb 01, 1934, MRN: [id number]  Chief Complaint  Patient presents with   Follow-up    Pt stated that he has been doing well and has no concerns.     PMH of cardiomyopathy, MI, stent placement, CAD, HLD, follows with Dr. Aundra Dubin. 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 Past 6 months worsening SOB. 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 He goes to the gym every day for an 1 hour. He never wheezes or has chest tightness. He feels like he runs out of breath. He was on allergy shots for a long time. He associates some of this of breath starting around the time he quit allergy shots back in June. Since shortness of breath with exertion.  He is able to go to the gym and walk for nearly 30 minutes without stopping.  He is able to get his heart rate up into the low 100s with exercise.  Patient denies chest pain, nausea vomiting, cough with sputum production. He does have nocturnal dyspnea.  He wakes up in the middle of the night feeling short of breath and will have to sit in the den or in his office upright for a few hours before he feels comfortable enough to lay flat again.  OV 08/21/2018: Positive RAST, positive IgE 215, our lab results returned from last office visit.  Since he was last seen he does feel as if his dyspnea on exertion may be a little bit better and his nocturnal symptoms are little better.  He still getting up in the middle of the night feeling short of breath.  He has not been having any episodes of wheezing.  He does notice that his posterior nasal drip symptoms may be slightly worse in the morning.  He still continually have to clear his sinus passages and throat in the morning.  Feel as if his hoarseness of voice is slightly worse.  He has not been rinsing out  his mouth after using his inhaler.  2 weeks ago at a football game at [facility] he fell down the stairs and landed on his right leg.  Since then the leg has been progressively swollen.  At this point he complains of calf pain with exertion.  The swelling is getting larger.  At this point his right leg is two thirds the size of his left leg.  He does have increased swelling in the legs as compared to his baseline  OV 10/09/2018: Doing well today in the office.  He does feel a little more dyspnea on exertion which is at his baseline.  He is concerned about his low heart rate.  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  He carries to 5 gallon buckets of water to all of his sheds/buildings that he stores his [place] in.  He has to keep up with his game birds.  Otherwise, he did try Breo for couple of days.  Was not really sure it made any difference so he went back to taking his Flovent.  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  Currently on Eliquis for this.  OV 04/02/2019: Patient recently seen by his allergist Dr. Remus Blake.  They increased his Flovent to 220.  Patient is accompanied today by his wife.  She seems very frustrated that her husband not getting any better.  She also describes that he may or may not be taking his medication as directed at home.  She is also concerned that he is sleeping all the time.  He seems to have somewhat day night reversal.  He is up half of the night sleeps most during the day.  She also states that he has not been active as he was before.  He was seen by his cardiologist having a repeat cardiac catheterization.  There is plans in the works for a cardiopulmonary exercise test but this has been on hold due to COVID restrictions.  Patient continues to complain of the exact same thing which is dyspnea on exertion.  This is been going on now for  several months.  But with further history taking and discussion with the wife it appears that he only takes his medication when he "thinks he needs it".  OV 06/03/2019: Patient seen today in follow-up regarding asthma symptoms.  Wife concerned that patient is still not taking medications correctly.  Also recently saw primary care [name].  I agree with his discussion with him that may be switching to nebulized medications may be simpler.  Additionally we could start approval process for biologic.  I would prefer that he actually be on a regular inhaled steroid.  He has been using these for some time but has not seen any significant change.  As for his insomnia and day's sleep reversal I think that this is going to have to be addressed.  Discussing these behavior issues at home seems to be significant amount of distress for the patient's wife.  She also would like me to call and speak with [name] if possible.  OV 07/29/2019: Patient seen today in the office for asthma follow-up symptoms.  Overall doing better today.  He feels like he is able to walk down to the dog lot take the trash out to the road without significant symptoms.  He did receive prior authorization approval for Dupixent injections.  He wants to talk about this today in the office before moving forward with injection therapy.  He does feel that his symptoms are better on Symbicort plus as needed albuterol.  His wife is concerned that he is being less active in comparison.  There is a discussion whether or not the change in seasons has improved his symptoms.  He is worried about this coming spring.  He had a significant amount of allergic hayfever type symptoms this past spring.  OV 10/10/2020: Here today for follow up regarding allergic asthma, cough and pe. He is currently managed with advair hfa. Per his wife he does not take this routinely eventhough he states that he takes it every day. He was on nucala injections but he did not think  they were helping so he stopped to this.  He has had prior positive Rast and IgE E as described above.  At this point from a respiratory standpoint he is at his baseline.  Wife has no concerns.  Patient has no concerns.   Past Medical History:  Diagnosis Date   Atrial premature beats    BPH (benign prostatic hyperplasia)    BPH (benign prostatic hypertrophy)    Cataract, right eye    Chronic low back pain    Colon  polyp    Coronary artery disease    DDD (degenerative disc disease), lumbar    Dermatitis 11/16   Elevated IgE level 06/30/2018   Hyperlipidemia    Insomnia    Kidney cysts    Memory loss 2015   Non-ischemic cardiomyopathy (HCC)    NSTEMI (non-ST elevated myocardial infarction) (HCC) 08/25/2014   Osteoarthritis of right knee    Pituitary tumor 2011   macroadenoma w/ VF compromise-then gamma knife a few years leater for some recurrence   Positive radioallergosorbent test (RAST) 06/30/2018   Status post insertion of drug-eluting stent into left anterior descending (LAD) artery 08/28/15   + nuc study.    Urinary retention    Vitamin D deficiency 01/2008     Family History  Problem Relation Age of Onset   Congestive Heart Failure Father 20   Other Mother 66   Coronary artery disease Brother    Other Sister 43       polio   Heart attack Brother        MI/ASCAD 2006   COPD Sister 66     Past Surgical History:  Procedure Laterality Date   ARTHROSCOPIC SURGERY     BACK SURGERY     CARDIAC CATHETERIZATION  07/27/2015   Dr Shirlee Latch; oLAD 95%, mLAD 50%, CFX stent OK, OM1 60%, OM2 99%, pPLOM 50%, RCA irregular, AM2 70%, EF 55-60%, no regional wall motion abnormalities.      CARDIAC CATHETERIZATION N/A 07/28/2015   Procedure: Left Heart Cath and Coronary Angiography;  Surgeon: [name], MD;  Location: [facility] INVASIVE CV LAB;  Service: Cardiovascular;  Laterality: N/A;   CARDIAC CATHETERIZATION N/A 07/28/2015   Procedure: Coronary Stent  Intervention;  Surgeon: Lennette Bihari, MD;  Location: [facility] INVASIVE CV LAB;  Service: Cardiovascular;  Laterality: N/A;   CATARACT EXTRACTION     CORONARY ANGIOPLASTY WITH STENT PLACEMENT  07/27/2015   XIENCE ALPINE RX 3.0X33 DES to the LAD, 95%>>0   HAMMER  BUNION TOE SURGERY     HAND SURGERY Left    LEFT HEART CATHETERIZATION WITH CORONARY ANGIOGRAM N/A 08/25/2014   Procedure: LEFT HEART CATHETERIZATION WITH CORONARY ANGIOGRAM;  Surgeon: Kathleene Hazel, MD;  Location: [facility] CATH LAB;  Service: Cardiovascular;  Laterality: N/A;   PITUITARY SURGERY     12/01/09 Dr. Annalee Genta and Dr. [name].   RIGHT/LEFT HEART CATH AND CORONARY ANGIOGRAPHY N/A 03/19/2018   Procedure: RIGHT/LEFT HEART CATH AND CORONARY ANGIOGRAPHY;  Surgeon: [name], MD;  Location: [facility] INVASIVE CV LAB;  Service: Cardiovascular;  Laterality: N/A;   RIGHT/LEFT HEART CATH AND CORONARY ANGIOGRAPHY N/A 03/13/2019   Procedure: RIGHT/LEFT HEART CATH AND CORONARY ANGIOGRAPHY;  Surgeon: [name], MD;  Location: [facility] INVASIVE CV LAB;  Service: Cardiovascular;  Laterality: N/A;    Social History   Socioeconomic History   Marital status: Married    Spouse name: Eber Jones   Number of children: 2   Years of education: College   Highest education level: Not on file  Occupational History   Occupation: retired Lexicographer: RETIRED  Tobacco Use   Smoking status: Never Smoker   Smokeless tobacco: Never Used  Building services engineer Use: Never used  Substance and Sexual Activity   Alcohol use: Not Currently   Drug use: No   Sexual activity: Not on file  Other Topics Concern   Not on file  Social History Narrative   Patient is married Medical laboratory scientific officer).   Patient has two  children.   Patient is an orthodonist.   Patient has a Naval architect.   Patient is right-handed.   Patient does not drink any caffeine.   Social Determinants of Health   Financial Resource Strain: Not on file  Food  Insecurity: Not on file  Transportation Needs: Not on file  Physical Activity: Not on file  Stress: Not on file  Social Connections: Not on file  Intimate Partner Violence: Not on file     Allergies  Allergen Reactions   Sulfa Antibiotics Anaphylaxis, Nausea And Vomiting and Swelling   Sulfamethoxazole Anaphylaxis, Nausea And Vomiting and Swelling   Sulfonamide Derivatives Anaphylaxis, Nausea And Vomiting and Swelling   Bee Venom Rash   Flonase [Fluticasone] Other (See Comments)    Caused nose bleeds   Tamsulosin Other (See Comments)    Unknown reaction- Patient doesn't think he is allergic (??)     Outpatient Medications Prior to Visit  Medication Sig Dispense Refill   albuterol (VENTOLIN HFA) 108 (90 Base) MCG/ACT inhaler Inhale 2 puffs into the lungs every 6 (six) hours as needed for wheezing or shortness of breath. 24 g 0   ascorbic acid (VITAMIN C) 500 MG tablet Take 1 tablet (500 mg total) by mouth daily. 30 tablet 0   aspirin EC 81 MG tablet Take 1 tablet (81 mg total) by mouth daily. 90 tablet 3   buPROPion (WELLBUTRIN XL) 150 MG 24 hr tablet Take 150 mg by mouth daily.      carvedilol (COREG) 12.5 MG tablet Take 1 tablet (12.5 mg total) by mouth 2 (two) times daily with a meal. 60 tablet 11   cefdinir (OMNICEF) 300 MG capsule Take 1 capsule (300 mg total) by mouth 2 (two) times daily. 10 capsule 0   chlorpheniramine-HYDROcodone (TUSSIONEX) 10-8 MG/5ML SUER Take 5 mLs by mouth every 12 (twelve) hours as needed for cough. 140 mL 0   dupilumab (DUPIXENT) 200 MG/1. prefilled syringe Inject 200 mg into the skin every 14 (fourteen) days. 2.28 mL 12   ELIQUIS 2.5 MG TABS tablet TAKE 1 TABLET(2.5 MG) BY MOUTH TWICE DAILY 180 tablet 0   Evolocumab (REPATHA) 140 MG/ML SOSY Inject 1 mL into the skin every 14 (fourteen) days.      ezetimibe (ZETIA) 10 MG tablet TAKE 1 TABLET(10 MG) BY MOUTH DAILY 90 tablet 3   fluticasone (FLONASE) 50 MCG/ACT nasal spray Place 2  sprays into both nostrils daily. 16 g 5   fluticasone-salmeterol (ADVAIR HFA) 230-21 MCG/ACT inhaler Inhale 2 puffs into the lungs 2 (two) times daily. 1 Inhaler 12   galantamine (RAZADYNE ER) 8 MG 24 hr capsule Take 8 mg by mouth every morning.     hydrocortisone (CORTEF) 20 MG tablet Take 20-40 mg by mouth See admin instructions. 20 mg every morning, 40 mg every evening     Ipratropium-Albuterol (COMBIVENT RESPIMAT) 20-100 MCG/ACT AERS respimat Inhale 1 puff into the lungs every 6 (six) hours.     levothyroxine (SYNTHROID, LEVOTHROID) 150 MCG tablet Take 150 mcg by mouth at bedtime.      loratadine (CLARITIN) 10 MG tablet Take 10 mg by mouth daily.     memantine (NAMENDA) 10 MG tablet Take 10 mg by mouth 2 (two) times daily.     memantine (NAMENDA) 5 MG tablet Take 5 mg by mouth 2 (two) times daily.      Mometasone Furo-Formoterol Fum (DULERA IN) Inhale 2 puffs into the lungs 2 (two) times daily at 10 am and 4 pm.  mometasone-formoterol (DULERA) 200-5 MCG/ACT AERO Inhale 2 puffs into the lungs 2 (two) times daily. 13 g 6   montelukast (SINGULAIR) 10 MG tablet Take 1 tablet (10 mg total) by mouth at bedtime. 30 tablet 11   MYRBETRIQ 50 MG TB24 tablet Take 50 mg by mouth at bedtime.   0   nebivolol (BYSTOLIC) 2.5 MG tablet TAKE 1/2 TABLET(1.25 MG) BY MOUTH DAILY 45 tablet 0   nitroGLYCERIN (NITROSTAT) 0.4 MG SL tablet Place 1 tablet (0.4 mg total) under the tongue every 5 (five) minutes as needed for chest pain. 90 tablet 3   omeprazole (PRILOSEC) 40 MG capsule Take 40 mg by mouth daily.     potassium chloride (KLOR-CON) 10 MEQ tablet Take 2 tablets (20 mEq total) by mouth daily. 180 tablet 3   ranolazine (RANEXA) 500 MG 12 hr tablet TAKE 1 TABLET(500 MG) BY MOUTH TWICE DAILY 60 tablet 2   testosterone cypionate (DEPOTESTOTERONE CYPIONATE) 200 MG/ML injection Inject 100 mg into the muscle every 21 ( twenty-one) days.      torsemide (DEMADEX) 10 MG tablet TAKE 1 TABLET(10 MG)  BY MOUTH DAILY 90 tablet 1   triamcinolone cream (KENALOG) 0.1 % Apply 1 application topically 2 (two) times daily as needed (irritation).      VASCEPA 1 g capsule TAKE 2 CAPSULES(2 GRAMS) BY MOUTH TWICE DAILY 120 capsule 2   zinc sulfate 220 (50 Zn) MG capsule Take 1 capsule (220 mg total) by mouth daily. 30 capsule 0   Facility-Administered Medications Prior to Visit  Medication Dose Route Frequency Provider Last Rate Last Admin   sodium chloride flush (NS) 0.9 % injection 3 mL  3 mL Intravenous Q12H [name], MD        Review of Systems  Constitutional: Negative for chills, fever, malaise/fatigue and weight loss.  HENT: Negative for hearing loss, sore throat and tinnitus.   Eyes: Negative for blurred vision and double vision.  Respiratory: Positive for cough and shortness of breath. Negative for hemoptysis, sputum production, wheezing and stridor.   Cardiovascular: Negative for chest pain, palpitations, orthopnea, leg swelling and PND.  Gastrointestinal: Negative for abdominal pain, constipation, diarrhea, heartburn, nausea and vomiting.  Genitourinary: Negative for dysuria, hematuria and urgency.  Musculoskeletal: Negative for joint pain and myalgias.  Skin: Negative for itching and rash.  Neurological: Negative for dizziness, tingling, weakness and headaches.  Endo/Heme/Allergies: Negative for environmental allergies. Does not bruise/bleed easily.  Psychiatric/Behavioral: Negative for depression. The patient is not nervous/anxious and does not have insomnia.   All other systems reviewed and are negative.    Objective:  Physical Exam Vitals reviewed.  Constitutional:      General: He is not in acute distress.    Appearance: He is well-developed and well-nourished.  HENT:     Head: Normocephalic and atraumatic.     Mouth/Throat:     Mouth: Oropharynx is clear and moist.  Eyes:     General: No scleral icterus.    Conjunctiva/sclera: Conjunctivae normal.      Pupils: Pupils are equal, round, and reactive to light.  Neck:     Vascular: No JVD.     Trachea: No tracheal deviation.  Cardiovascular:     Rate and Rhythm: Normal rate and regular rhythm.     Pulses: Intact distal pulses.     Heart sounds: Normal heart sounds. No murmur heard.   Pulmonary:     Effort: Pulmonary effort is normal. No tachypnea, accessory muscle usage or respiratory distress.  Breath sounds: Normal breath sounds. No stridor. No wheezing, rhonchi or rales.  Abdominal:     General: Bowel sounds are normal. There is no distension.     Palpations: Abdomen is soft.     Tenderness: There is no abdominal tenderness.  Musculoskeletal:        General: No tenderness.     Cervical back: Neck supple.     Right lower leg: Edema present.     Left lower leg: Edema present.  Lymphadenopathy:     Cervical: No cervical adenopathy.  Skin:    General: Skin is warm and dry.     Capillary Refill: Capillary refill takes less than 2 seconds.     Findings: No rash.  Neurological:     Mental Status: He is alert and oriented to person, place, and time.  Psychiatric:        Mood and Affect: Mood and affect normal.        Behavior: Behavior normal.      Vitals:   10/10/20 1425  BP: 130/78  Pulse: (!) 50  Temp: (!) 97 F (36.1 C)  TempSrc: Tympanic  SpO2: 97%  Weight: 245 lb 2 oz (111.2 kg)  Height: 6' 1.5" (1.867 m)   97% on RA BMI Readings from Last 3 Encounters:  10/10/20 31.90 kg/m  10/13/19 29.33 kg/m  07/29/19 32.87 kg/m   Wt Readings from Last 3 Encounters:  10/10/20 245 lb 2 oz (111.2 kg)  10/13/19 240 lb 15.4 oz (109.3 kg)  07/29/19 252 lb 9.6 oz (114.6 kg)     CBC    Component Value Date/Time   WBC 11.3 (H) 10/14/2019 0403   RBC 4.41 10/14/2019 0403   HGB 14.3 10/14/2019 0403   HCT 43.7 10/14/2019 0403   PLT 332 10/14/2019 0403   MCV 99.1 10/14/2019 0403   MCH 32.4 10/14/2019 0403   MCHC 32.7 10/14/2019 0403   RDW 14.4 10/14/2019 0403    LYMPHSABS 1.2 10/14/2019 0403   MONOABS 0.6 10/14/2019 0403   EOSABS 0.2 10/14/2019 0403   BASOSABS 0.1 10/14/2019 0403    Pulmonary Functions Testing Results: PFT Results Latest Ref Rng & Units 04/17/2018  FVC-Pre L 4.51  FVC-Predicted Pre % 93  FVC-Post L 4.76  FVC-Predicted Post % 98  Pre FEV1/FVC % % 78  Post FEV1/FCV % % 77  FEV1-Pre L 3.51  FEV1-Predicted Pre % 101  FEV1-Post L 3.68  DLCO uncorrected ml/min/mmHg 20.00  DLCO UNC% % 51  DLVA Predicted % 57  TLC L 7.54  TLC % Predicted % 93  RV % Predicted % 100   Regional allergy panel: Positive for multiple items to include cat dander, dog, cockroach, multiple trees. IGE 215  Chest Imaging: CT chest 06/27/2018: No significant parenchymal abnormality. The patient's images have been independently reviewed by me.    FeNO: None   Pathology: None   Echocardiogram: None   Heart Catheterization:  Fick Cardiac Output 6.26 L/min  Fick Cardiac Output Index 2.64 (L/min)/BSA  RA A Wave 12 mmHg  RA V Wave 11 mmHg  RA Mean 8 mmHg  RV Systolic Pressure 29 mmHg  RV Diastolic Pressure 5 mmHg  RV EDP 11 mmHg  PA Systolic Pressure 31 mmHg  PA Diastolic Pressure 17 mmHg  PA Mean 21 mmHg  AO Systolic Pressure 143 mmHg  AO Diastolic Pressure 76 mmHg  AO Mean 103 mmHg  LV Systolic Pressure 147 mmHg  LV Diastolic Pressure 9 mmHg  LV EDP 20 mmHg  AOp Systolic Pressure 782 mmHg  AOp Diastolic Pressure 68 mmHg  AOp Mean Pressure 423 mmHg  LVp Systolic Pressure 536 mmHg  LVp Diastolic Pressure 9 mmHg  LVp EDP Pressure 19 mmHg  QP/QS 1  TPVR Index 7.95 HRUI  TSVR Index 38.98 HRUI  TPVR/TSVR Ratio 0.2   PVR = 2.0 [place]    Assessment & Plan:     ICD-10-CM   1. Moderate persistent asthma without complication  R44.31   2. Positive radioallergosorbent test (RAST)  T78.49XA   3. Multiple subsegmental pulmonary emboli without acute cor pulmonale (HCC)  I26.94   4. VTE (venous thromboembolism)  I82.90   5. Elevated IgE  level  R76.8     Discussion:  87 year old gentleman here today for follow-up of chronic dyspnea.  This is likely multifactorial related to his history of PE, cardiomyopathy currently managed by cardiology.  As for his allergic asthma, positive Rast, elevated IgE he was on Dupixent however has declined continued injections of this at this time as he has not seen much improvement.  Still continued on Eliquis for his history of VTE.  Plan: As for asthma continue Advair HFA twice daily. Continue albuterol shortness of breath and wheezing Continue Singulair for seasonal allergies. Recommended some form of a daily exercise routine Recommended compression stockings for his chronic lower extremity edema and to keep his feet elevated while at rest.    Current Outpatient Medications:    albuterol (VENTOLIN HFA) 108 (90 Base) MCG/ACT inhaler, Inhale 2 puffs into the lungs every 6 (six) hours as needed for wheezing or shortness of breath., Disp: 24 g, Rfl: 0   ascorbic acid (VITAMIN C) 500 MG tablet, Take 1 tablet (500 mg total) by mouth daily., Disp: 30 tablet, Rfl: 0   aspirin EC 81 MG tablet, Take 1 tablet (81 mg total) by mouth daily., Disp: 90 tablet, Rfl: 3   buPROPion (WELLBUTRIN XL) 150 MG 24 hr tablet, Take 150 mg by mouth daily. , Disp: , Rfl:    carvedilol (COREG) 12.5 MG tablet, Take 1 tablet (12.5 mg total) by mouth 2 (two) times daily with a meal., Disp: 60 tablet, Rfl: 11   cefdinir (OMNICEF) 300 MG capsule, Take 1 capsule (300 mg total) by mouth 2 (two) times daily., Disp: 10 capsule, Rfl: 0   chlorpheniramine-HYDROcodone (TUSSIONEX) 10-8 MG/5ML SUER, Take 5 mLs by mouth every 12 (twelve) hours as needed for cough., Disp: 140 mL, Rfl: 0   dupilumab (DUPIXENT) 200 MG/1.14ML prefilled syringe, Inject 200 mg into the skin every 14 (fourteen) days., Disp: 2.28 mL, Rfl: 12   ELIQUIS 2.5 MG TABS tablet, TAKE 1 TABLET(2.5 MG) BY MOUTH TWICE DAILY, Disp: 180 tablet, Rfl: 0    Evolocumab (REPATHA) 140 MG/ML SOSY, Inject 1 mL into the skin every 14 (fourteen) days. , Disp: , Rfl:    ezetimibe (ZETIA) 10 MG tablet, TAKE 1 TABLET(10 MG) BY MOUTH DAILY, Disp: 90 tablet, Rfl: 3   fluticasone (FLONASE) 50 MCG/ACT nasal spray, Place 2 sprays into both nostrils daily., Disp: 16 g, Rfl: 5   fluticasone-salmeterol (ADVAIR HFA) 230-21 MCG/ACT inhaler, Inhale 2 puffs into the lungs 2 (two) times daily., Disp: 1 Inhaler, Rfl: 12   galantamine (RAZADYNE ER) 8 MG 24 hr capsule, Take 8 mg by mouth every morning., Disp: , Rfl:    hydrocortisone (CORTEF) 20 MG tablet, Take 20-40 mg by mouth See admin instructions. 20 mg every morning, 40 mg every evening, Disp: , Rfl:    Ipratropium-Albuterol (COMBIVENT  RESPIMAT) 20-100 MCG/ACT AERS respimat, Inhale 1 puff into the lungs every 6 (six) hours., Disp: , Rfl:    levothyroxine (SYNTHROID, LEVOTHROID) 150 MCG tablet, Take 150 mcg by mouth at bedtime. , Disp: , Rfl:    loratadine (CLARITIN) 10 MG tablet, Take 10 mg by mouth daily., Disp: , Rfl:    memantine (NAMENDA) 10 MG tablet, Take 10 mg by mouth 2 (two) times daily., Disp: , Rfl:    memantine (NAMENDA) 5 MG tablet, Take 5 mg by mouth 2 (two) times daily. , Disp: , Rfl:    Mometasone Furo-Formoterol Fum (DULERA IN), Inhale 2 puffs into the lungs 2 (two) times daily at 10 am and 4 pm., Disp: , Rfl:    mometasone-formoterol (DULERA) 200-5 MCG/ACT AERO, Inhale 2 puffs into the lungs 2 (two) times daily., Disp: 13 g, Rfl: 6   montelukast (SINGULAIR) 10 MG tablet, Take 1 tablet (10 mg total) by mouth at bedtime., Disp: 30 tablet, Rfl: 11   MYRBETRIQ 50 MG TB24 tablet, Take 50 mg by mouth at bedtime. , Disp: , Rfl: 0   nebivolol (BYSTOLIC) 2.5 MG tablet, TAKE 1/2 TABLET(1.25 MG) BY MOUTH DAILY, Disp: 45 tablet, Rfl: 0   nitroGLYCERIN (NITROSTAT) 0.4 MG SL tablet, Place 1 tablet (0.4 mg total) under the tongue every 5 (five) minutes as needed for chest pain., Disp: 90 tablet, Rfl:  3   omeprazole (PRILOSEC) 40 MG capsule, Take 40 mg by mouth daily., Disp: , Rfl:    potassium chloride (KLOR-CON) 10 MEQ tablet, Take 2 tablets (20 mEq total) by mouth daily., Disp: 180 tablet, Rfl: 3   ranolazine (RANEXA) 500 MG 12 hr tablet, TAKE 1 TABLET(500 MG) BY MOUTH TWICE DAILY, Disp: 60 tablet, Rfl: 2   testosterone cypionate (DEPOTESTOTERONE CYPIONATE) 200 MG/ML injection, Inject 100 mg into the muscle every 21 ( twenty-one) days. , Disp: , Rfl:    torsemide (DEMADEX) 10 MG tablet, TAKE 1 TABLET(10 MG) BY MOUTH DAILY, Disp: 90 tablet, Rfl: 1   triamcinolone cream (KENALOG) 0.1 %, Apply 1 application topically 2 (two) times daily as needed (irritation). , Disp: , Rfl:    VASCEPA 1 g capsule, TAKE 2 CAPSULES(2 GRAMS) BY MOUTH TWICE DAILY, Disp: 120 capsule, Rfl: 2   zinc sulfate 220 (50 Zn) MG capsule, Take 1 capsule (220 mg total) by mouth daily., Disp: 30 capsule, Rfl: 0 No current facility-administered medications for this visit.  Facility-Administered Medications Ordered in Other Visits:    sodium chloride flush (NS) 0.9 % injection 3 mL, 3 mL, Intravenous, Q12H, [name], MD   Josephine Igo, DO [facility] 10/10/2020 2:38 PM

## 2020-10-12 DIAGNOSIS — E291 Testicular hypofunction: Secondary | ICD-10-CM | POA: Diagnosis not present

## 2020-10-25 ENCOUNTER — Encounter (HOSPITAL_COMMUNITY): Payer: Self-pay | Admitting: Cardiology

## 2020-10-25 ENCOUNTER — Other Ambulatory Visit: Payer: Self-pay

## 2020-10-25 ENCOUNTER — Ambulatory Visit (HOSPITAL_COMMUNITY)
Admission: RE | Admit: 2020-10-25 | Discharge: 2020-10-25 | Disposition: A | Discharge status: Home / Self Care | Payer: Medicare Other | Source: Ambulatory Visit | Attending: Cardiology | Admitting: Cardiology

## 2020-10-25 VITALS — BP 128/80 | HR 60 | Wt 241.4 lb

## 2020-10-25 DIAGNOSIS — Z7951 Long term (current) use of inhaled steroids: Secondary | ICD-10-CM | POA: Diagnosis not present

## 2020-10-25 DIAGNOSIS — J1282 Pneumonia due to coronavirus disease 2019: Secondary | ICD-10-CM | POA: Insufficient documentation

## 2020-10-25 DIAGNOSIS — Z79899 Other long term (current) drug therapy: Secondary | ICD-10-CM | POA: Diagnosis not present

## 2020-10-25 DIAGNOSIS — I2510 Atherosclerotic heart disease of native coronary artery without angina pectoris: Secondary | ICD-10-CM | POA: Diagnosis not present

## 2020-10-25 DIAGNOSIS — Z7902 Long term (current) use of antithrombotics/antiplatelets: Secondary | ICD-10-CM | POA: Diagnosis not present

## 2020-10-25 DIAGNOSIS — R0602 Shortness of breath: Secondary | ICD-10-CM | POA: Insufficient documentation

## 2020-10-25 DIAGNOSIS — E785 Hyperlipidemia, unspecified: Secondary | ICD-10-CM | POA: Insufficient documentation

## 2020-10-25 DIAGNOSIS — I428 Other cardiomyopathies: Secondary | ICD-10-CM | POA: Diagnosis not present

## 2020-10-25 DIAGNOSIS — Z7901 Long term (current) use of anticoagulants: Secondary | ICD-10-CM | POA: Insufficient documentation

## 2020-10-25 DIAGNOSIS — I429 Cardiomyopathy, unspecified: Secondary | ICD-10-CM

## 2020-10-25 DIAGNOSIS — J45909 Unspecified asthma, uncomplicated: Secondary | ICD-10-CM | POA: Insufficient documentation

## 2020-10-25 DIAGNOSIS — I252 Old myocardial infarction: Secondary | ICD-10-CM | POA: Insufficient documentation

## 2020-10-25 DIAGNOSIS — Z955 Presence of coronary angioplasty implant and graft: Secondary | ICD-10-CM | POA: Insufficient documentation

## 2020-10-25 DIAGNOSIS — Z882 Allergy status to sulfonamides status: Secondary | ICD-10-CM | POA: Insufficient documentation

## 2020-10-25 DIAGNOSIS — U071 COVID-19: Secondary | ICD-10-CM | POA: Insufficient documentation

## 2020-10-25 DIAGNOSIS — I82409 Acute embolism and thrombosis of unspecified deep veins of unspecified lower extremity: Secondary | ICD-10-CM | POA: Insufficient documentation

## 2020-10-25 DIAGNOSIS — Z8249 Family history of ischemic heart disease and other diseases of the circulatory system: Secondary | ICD-10-CM | POA: Insufficient documentation

## 2020-10-25 DIAGNOSIS — I509 Heart failure, unspecified: Secondary | ICD-10-CM | POA: Insufficient documentation

## 2020-10-25 DIAGNOSIS — I25118 Atherosclerotic heart disease of native coronary artery with other forms of angina pectoris: Secondary | ICD-10-CM | POA: Diagnosis not present

## 2020-10-25 DIAGNOSIS — Z7982 Long term (current) use of aspirin: Secondary | ICD-10-CM | POA: Insufficient documentation

## 2020-10-25 DIAGNOSIS — F0390 Unspecified dementia without behavioral disturbance: Secondary | ICD-10-CM | POA: Insufficient documentation

## 2020-10-25 DIAGNOSIS — N183 Chronic kidney disease, stage 3 unspecified: Secondary | ICD-10-CM | POA: Insufficient documentation

## 2020-10-25 LAB — BASIC METABOLIC PANEL
Anion gap: 12 (ref 5–15)
BUN: 18 mg/dL (ref 8–23)
CO2: 25 mmol/L (ref 22–32)
Calcium: 8.6 mg/dL — ABNORMAL LOW (ref 8.9–10.3)
Chloride: 105 mmol/L (ref 98–111)
Creatinine, Ser: 1.48 mg/dL — ABNORMAL HIGH (ref 0.61–1.24)
GFR, Estimated: 46 mL/min — ABNORMAL LOW (ref >60)
Glucose, Bld: 115 mg/dL — ABNORMAL HIGH (ref 70–99)
Potassium: 3.4 mmol/L — ABNORMAL LOW (ref 3.5–5.1)
Sodium: 142 mmol/L (ref 135–145)

## 2020-10-25 LAB — BRAIN NATRIURETIC PEPTIDE: B Natriuretic Peptide: 346.8 pg/mL — ABNORMAL HIGH (ref 0.0–100.0)

## 2020-10-25 NOTE — Patient Instructions (Signed)
Labs done today, your results will be available in MyChart, we will contact you for abnormal readings.  Your physician has requested that you have an echocardiogram. Echocardiography is a painless test that uses sound waves to create images of your heart. It provides your doctor with information about the size and shape of your heart and how well your heart's chambers and valves are working. This procedure takes approximately one hour. There are no restrictions for this procedure.  Your physician recommends that you schedule a follow-up appointment in: 4 months with echocardiogram

## 2020-10-26 NOTE — Progress Notes (Signed)
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 Nonischemic cardiomyopathy with EF 40% was diagnosed by echo in 2014. Historically, he has not been able to tolerate statins, and he has been tried on multiple agents. He is now on Repatha.   In 11/15, he developed chest tightness and was admitted to [facility] with NSTEMI.  LHC showed 60-70% proximal LAD stenosis with totally occluded LCx.  This was opened with DES to the proximal LCx.  Echo post-cath showed EF 55-60%.  In 10/16, he developed typical angina. LHC showed 95% proximal LAD stenosis that was treated with DES.    He has had difficulty with BP-active meds in the setting of adrenal insufficiency post pituitary surgery.   Admitted 11/2016 for syncope. Thought to be in setting of adrenal insufficiency complicated by viral gastroenteritis.   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 He has also had PND-like symptoms.   Given worsening symptoms, I started him on low dose Lasix 20 mg daily. Echo in 6/19 showed EF 50-55% with inferolateral hypokinesis.  Cardiolite in 6/19 showed EF 47% with primarily fixed inferolateral defect.  I took him for RHC/LHC.  This showed nonobstructive coronary disease and minimally elevated filling pressures.  PFTs in 7/19 were unremarkable except for decreased DLCO.  Switching from Brilinta to Plavix did not help his symptoms.  He saw pulmonology and an allergist, he was noted to have a number of environmental allergies,  and there was concern that asthma was causing some of his symptoms.    In 11/19, he fell at a [employer] game and injured his right leg.  The leg began to swell soon after and he was found to have a DVT and bilateral PEs. He was started on Eliquis.   LHC/RHC in 6/20 showed distal and branch vessel disease with no PCI target, normal filling pressures and preserved cardiac output.   In 1/21, he was admitted with COVID-19 PNA and cellulitis.   He returns for followup of CHF and CAD.  I have not seen him in > 1 year, he has had a significant functional decline.  He has had memory issues, now with mild dementia.  Per his wife, he "sits around" and does not get any exercise (used to be quite active).  No dyspnea walking around the house or into the office today, still gets short of breath with heavier exertion such as carrying a load. Follows with pulmonary also, does not think that inhalers for asthma help much.  No chest pain.  No orthopnea/PND.     Labs (5/12): K 4.3, creatinine 1.03 Labs (2/15): K 4, creatinine 1.1, LDL 184 Labs (11/15): K 4.8, creatinine 1.07 Labs (3/16): K 4.4, creatinine 0.9 Labs (4/16): LDL 80, HDL 37 (on Repatha) Labs (5/16): HCT 42.7 Labs (10/16): creatinine 1.05 Labs (11/16): LDL 77, LDL-P 1283 Labs (6/18): LDL 119, HDL 42, K 4.1, creatinine 1.2 Labs (12/18): LDL 70, HDL 45 Labs (6/19): K 4, creatinine 1.23, BNP 128 Labs (8/19): K 4, creatinine 1.5,  HDL 41, LDL 75, hgb 14.8 Labs (9/19): K 4.2, creatinine 1.66, hgb 14.5 Labs (12/19): K 4.7, creatinine 1.7, hgb 14.5 Labs (2/20): K 3.7, creatinine 1.44, hgb 13.7, LDL 95 Labs (10/21): K 4.1, creatinine 1.5, LDL 48, HDL 40, hgb 12.6  ECG (personally reviewed): NSR, LVH, iRBBB  PMH: 1. Hyperlipidemia: He has been unable to tolerate statins.  2. Chronic atrial bigeminy: x years.  3. Hypothyroidism: s/p surgery for pituitary adenoma.  4. Pituitary adenoma: s/p surgery in 3/11 for removal.  Now on thyroid replacement and  hydrocortisone.  5. Cardiomyopathy: LHC (2009) with 50% mid LAD, 60% distal LAD.  Left ventriculogram (2009) with EF 40-45%.  Echo (8/13) with EF 35-40%, mild LVH.  Echo (11/14) with EF 40%, diffuse hypokinesis, normal RV.  He was tried in the past on an ACEI but apparently at that time began to have side effects from his pituitary adenoma that were thought initially to be ACEI-related.   He has been off ACEI since that time.  He has had trouble tolerating more that 1/2 tab of 3.125 mg Coreg once a day. Echo (11/15) with EF 55-60%, aortic sclerosis without significant stenosis.  - Echo (3/18): EF 60-65%, no significant valvular abnormalities.  - Echo (6/19): EF 50-55%, mild LV dilation, inferolateral hypokinesis.  6. CAD: 2009 LHC with 50% mid, 60% distal LAD. NSTEMI 11/15 with 60-70% pLAD, totally occluded LCx with DES to proximal LCx.  EF 55-60% by LV-gram.  Angina with LHC 10/16 showing 95% pLAD, 40% mLAD, 40% dLAD, 70% AM2 => treated with DES to pLAD.  - Cardiolite (6/19): EF 47%, primarily fixed inferolateral defect.  - RHC/LHC (6/19): mean RA 8, PA 31/17, mean PCWP 20, CI 2.64. 40% pLAD, patent proximal LAD stent, 60-70% distal LAD, patent LCx stent.  - RHC/LHC (6/20): Distal and branch vessel disease with no PCI target; mean RA 5, PA 29/12, mean PCWP 12, CI 2.59.  7. Mild dementia.  8. Carotid stenosis: Carotid dopplers (1/16) with 40-59% RICA stenosis.  - Carotid dopplers (1/17): Mild plaque only.  9. PFTs (7/19): unremarkable except for decreased DLCO.  10. CT chest (7/19): No significant pulmonary disease.  11. Asthma 12. DVT/PE: 11/19, occurred after trauma to right lower leg.  13. CKD: Stage 3.  14. Negative sleep study in 7/20. 15. COVID-19 in 1/21.    Current Outpatient Medications  Medication Sig Dispense Refill  . albuterol (VENTOLIN HFA) 108 (90 Base) MCG/ACT inhaler Inhale 2 puffs into the lungs every 6 (six) hours as needed for wheezing or shortness of breath. 24 g 0  .  ascorbic acid (VITAMIN C) 500 MG tablet Take 1 tablet (500 mg total) by mouth daily. 30 tablet 0  . aspirin EC 81 MG tablet Take 1 tablet (81 mg total) by mouth daily. 90 tablet 3  . buPROPion (WELLBUTRIN XL) 150 MG 24 hr tablet Take 150 mg by mouth daily.     . carvedilol (COREG) 12.5 MG tablet Take 1 tablet (12.5 mg total) by mouth 2 (two) times daily with a meal. 60 tablet 11  . cefdinir (OMNICEF) 300 MG capsule Take 1 capsule (300 mg total) by mouth 2 (two) times daily. 10 capsule 0  . chlorpheniramine-HYDROcodone (TUSSIONEX) 10-8 MG/5ML SUER Take 5 mLs by mouth every 12 (twelve) hours as needed for cough. 140 mL 0  . ELIQUIS 2.5 MG TABS tablet TAKE 1 TABLET(2.5 MG) BY MOUTH TWICE DAILY 180 tablet 0  . Evolocumab (REPATHA) 140 MG/ML SOSY Inject 1 mL into  the skin every 14 (fourteen) days.     Marland Kitchen ezetimibe (ZETIA) 10 MG tablet TAKE 1 TABLET(10 MG) BY MOUTH DAILY 90 tablet 3  . fluticasone (FLONASE) 50 MCG/ACT nasal spray Place 2 sprays into both nostrils daily. 16 g 5  . fluticasone-salmeterol (ADVAIR HFA) 230-21 MCG/ACT inhaler Inhale 2 puffs into the lungs 2 (two) times daily. 1 Inhaler 12  . galantamine (RAZADYNE ER) 8 MG 24 hr capsule Take 8 mg by mouth every morning.    . hydrocortisone (CORTEF) 20 MG tablet Take 20-40 mg by mouth See admin instructions. 20 mg every morning, 40 mg every evening    . Ipratropium-Albuterol (COMBIVENT RESPIMAT) 20-100 MCG/ACT AERS respimat Inhale 1 puff into the lungs every 6 (six) hours.    Marland Kitchen levothyroxine (SYNTHROID, LEVOTHROID) 150 MCG tablet Take 150 mcg by mouth at bedtime.     Marland Kitchen loratadine (CLARITIN) 10 MG tablet Take 10 mg by mouth daily.    . montelukast (SINGULAIR) 10 MG tablet Take 1 tablet (10 mg total) by mouth at bedtime. 30 tablet 11  . MYRBETRIQ 50 MG TB24 tablet Take 50 mg by mouth at bedtime.   0  . nebivolol (BYSTOLIC) 2.5 MG tablet TAKE 1/2 TABLET(1.25 MG) BY MOUTH DAILY 45 tablet 0  . nitroGLYCERIN (NITROSTAT) 0.4 MG SL tablet Place 1  tablet (0.4 mg total) under the tongue every 5 (five) minutes as needed for chest pain. 90 tablet 3  . potassium chloride (KLOR-CON) 10 MEQ tablet Take 2 tablets (20 mEq total) by mouth daily. 180 tablet 3  . ranolazine (RANEXA) 500 MG 12 hr tablet TAKE 1 TABLET(500 MG) BY MOUTH TWICE DAILY 60 tablet 2  . testosterone cypionate (DEPOTESTOTERONE CYPIONATE) 200 MG/ML injection Inject 100 mg into the muscle every 21 ( twenty-one) days.     Marland Kitchen torsemide (DEMADEX) 10 MG tablet TAKE 1 TABLET(10 MG) BY MOUTH DAILY (Patient taking differently: Every other day) 90 tablet 1  . triamcinolone cream (KENALOG) 0.1 % Apply 1 application topically 2 (two) times daily as needed (irritation).     . VASCEPA 1 g capsule TAKE 2 CAPSULES(2 GRAMS) BY MOUTH TWICE DAILY 120 capsule 2  . zinc sulfate 220 (50 Zn) MG capsule Take 1 capsule (220 mg total) by mouth daily. 30 capsule 0  . memantine (NAMENDA) 10 MG tablet Take 10 mg by mouth 2 (two) times daily.     No current facility-administered medications for this encounter.   Facility-Administered Medications Ordered in Other Encounters  Medication Dose Route Frequency Provider Last Rate Last Admin  . sodium chloride flush (NS) 0.9 % injection 3 mL  3 mL Intravenous Q12H [name], MD        Allergies:   Sulfa antibiotics, Sulfamethoxazole, Sulfonamide derivatives, Bee venom, Flonase [fluticasone], and Tamsulosin   Social History:  The patient  reports that he has never smoked. He has never used smokeless tobacco. He reports previous alcohol use. He reports that he does not use drugs.   Family History:  The patient's family history includes COPD (age of onset: 3) in his sister; Congestive Heart Failure (age of onset: 60) in his father; Coronary artery disease in his brother; Heart attack in his brother; Other (age of onset: 70) in his sister; Other (age of onset: 43) in his mother.   ROS:  Please see the history of present illness.   All other systems are  personally reviewed and negative.   Exam:   BP 128/80   Pulse 60  Wt 109.5 kg (241 lb 6.4 oz)   SpO2 96%   BMI 31.42 kg/m  General: NAD Neck: No JVD, no thyromegaly or thyroid nodule.  Lungs: Clear to auscultation bilaterally with normal respiratory effort. CV: Nondisplaced PMI.  Heart regular S1/S2, no S3/S4, 1/6 SEM RUSB.  1+ ankle edema bilaterally.  No carotid bruit.  Normal pedal pulses.  Abdomen: Soft, nontender, no hepatosplenomegaly, no distention.  Skin: Intact without lesions or rashes.  Neurologic: Alert and oriented x 3.  Psych: Normal affect. Extremities: No clubbing or cyanosis.  HEENT: Normal.   Recent Labs: 10/25/2020: B Natriuretic Peptide 346.8; BUN 18; Creatinine, Ser 1.48; Potassium 3.4; Sodium 142  Personally reviewed   Wt Readings from Last 3 Encounters:  10/25/20 109.5 kg (241 lb 6.4 oz)  10/10/20 111.2 kg (245 lb 2 oz)  10/13/19 109.3 kg (240 lb 15.4 oz)      ASSESSMENT AND PLAN:  1. Cardiomyopathy: EF 40% with diffuse hypokinesis on 11/14 echo, but echo in 3/18 showed EF up to 60-65%.  Echo in 6/19 with EF 50-55%, mildly dilated LV.  RHC in 6/20 was well-compensated with normal filling pressures.  He has had chronic exertional dyspnea which I have had trouble explaining.  Increasing Lasix has not helped.  Inhalers have not helped, he has seen pulmonary.  It is possible that it is due to deconditioning more than anything else though asthma may play a role.   - Contiue Coreg 12.5 mg bid.  - Continue torsemide 10 mg qod.  BMET/BNP today.   - I will arrange repeat echo with next appt.  2. CAD: s/p NSTEMI with DES to LCx then angina with DES to pLAD.  He has not tolerated multiple statins but is now on Repatha.  LHC was done in 6/19 due to equivocal Cardiolite and ongoing dyspnea, LHC showed no significantly obstructive disease. LHC in 6/20 showed distal and branch vessel disease, no PCI target.  He was started on ranolazine in case exertional dyspnea was an  anginal equivalent.  - Continue ASA 81 daily.   - Continue Repatha and Zetia, good lipids in 10/21.  3. Hyperlipidemia: He has not tolerated multiple statins. Has previously failed Crestor 5 mg two times/week due to myalgias.   - Continue Repatha and Zetia, good lipids in 10/21.  4. PE/DVT: Post-traumatic DVT in 11/19.  He is on Eliquis 2.5 mg bid for long-term use.  5. Deconditioning: I suspect that much of his fatigue/dyspnea is due to deconditioning.  I encouraged regular exercise, has an exercise bike that I asked him to use daily. 6. CKD: Stage 3. Arrange BMET.   Signed, [name], MD  10/26/2020  [facility] [address] [facility] [place] [address] [phone] (office) [phone] (fax)

## 2020-11-01 DIAGNOSIS — E291 Testicular hypofunction: Secondary | ICD-10-CM | POA: Diagnosis not present

## 2020-11-02 ENCOUNTER — Other Ambulatory Visit (HOSPITAL_COMMUNITY): Payer: Self-pay | Admitting: Cardiology

## 2020-11-06 ENCOUNTER — Other Ambulatory Visit: Payer: Self-pay | Admitting: Internal Medicine

## 2020-11-07 ENCOUNTER — Other Ambulatory Visit: Payer: Self-pay | Admitting: Internal Medicine

## 2020-11-07 DIAGNOSIS — R7301 Impaired fasting glucose: Secondary | ICD-10-CM | POA: Diagnosis not present

## 2020-11-07 DIAGNOSIS — E039 Hypothyroidism, unspecified: Secondary | ICD-10-CM | POA: Diagnosis not present

## 2020-11-07 DIAGNOSIS — Z125 Encounter for screening for malignant neoplasm of prostate: Secondary | ICD-10-CM | POA: Diagnosis not present

## 2020-11-07 DIAGNOSIS — E785 Hyperlipidemia, unspecified: Secondary | ICD-10-CM | POA: Diagnosis not present

## 2020-11-14 DIAGNOSIS — E039 Hypothyroidism, unspecified: Secondary | ICD-10-CM | POA: Diagnosis not present

## 2020-11-14 DIAGNOSIS — I2699 Other pulmonary embolism without acute cor pulmonale: Secondary | ICD-10-CM | POA: Diagnosis not present

## 2020-11-14 DIAGNOSIS — E785 Hyperlipidemia, unspecified: Secondary | ICD-10-CM | POA: Diagnosis not present

## 2020-11-14 DIAGNOSIS — I129 Hypertensive chronic kidney disease with stage 1 through stage 4 chronic kidney disease, or unspecified chronic kidney disease: Secondary | ICD-10-CM | POA: Diagnosis not present

## 2020-11-14 DIAGNOSIS — R7301 Impaired fasting glucose: Secondary | ICD-10-CM | POA: Diagnosis not present

## 2020-11-14 DIAGNOSIS — I8310 Varicose veins of unspecified lower extremity with inflammation: Secondary | ICD-10-CM | POA: Diagnosis not present

## 2020-11-14 DIAGNOSIS — R2689 Other abnormalities of gait and mobility: Secondary | ICD-10-CM | POA: Diagnosis not present

## 2020-11-14 DIAGNOSIS — F0390 Unspecified dementia without behavioral disturbance: Secondary | ICD-10-CM | POA: Diagnosis not present

## 2020-11-14 DIAGNOSIS — N401 Enlarged prostate with lower urinary tract symptoms: Secondary | ICD-10-CM | POA: Diagnosis not present

## 2020-11-14 DIAGNOSIS — E230 Hypopituitarism: Secondary | ICD-10-CM | POA: Diagnosis not present

## 2020-11-14 DIAGNOSIS — E291 Testicular hypofunction: Secondary | ICD-10-CM | POA: Diagnosis not present

## 2020-11-14 DIAGNOSIS — N1831 Chronic kidney disease, stage 3a: Secondary | ICD-10-CM | POA: Diagnosis not present

## 2020-11-21 ENCOUNTER — Other Ambulatory Visit: Payer: Self-pay | Admitting: *Deleted

## 2020-11-21 MED ORDER — ALBUTEROL SULFATE HFA 108 (90 BASE) MCG/ACT IN AERS: 2.0000 | INHALATION_SPRAY | Freq: Four times a day (QID) | RESPIRATORY_TRACT | 6 refills | Status: DC | PRN

## 2020-11-23 DIAGNOSIS — E291 Testicular hypofunction: Secondary | ICD-10-CM | POA: Diagnosis not present

## 2020-11-30 ENCOUNTER — Encounter (HOSPITAL_COMMUNITY): Payer: Self-pay

## 2020-11-30 ENCOUNTER — Encounter (HOSPITAL_COMMUNITY): Payer: Self-pay | Admitting: Emergency Medicine

## 2020-11-30 ENCOUNTER — Other Ambulatory Visit: Payer: Self-pay

## 2020-11-30 ENCOUNTER — Ambulatory Visit (HOSPITAL_COMMUNITY)
Admission: RE | Admit: 2020-11-30 | Discharge: 2020-11-30 | Disposition: A | Discharge status: Home / Self Care | Payer: Medicare Other | Source: Ambulatory Visit | Attending: Cardiology | Admitting: Cardiology

## 2020-11-30 ENCOUNTER — Ambulatory Visit (HOSPITAL_COMMUNITY): Admit: 2020-11-30 | Payer: Medicare Other

## 2020-11-30 ENCOUNTER — Other Ambulatory Visit (HOSPITAL_COMMUNITY): Payer: Self-pay | Admitting: Cardiology

## 2020-11-30 ENCOUNTER — Emergency Department (HOSPITAL_COMMUNITY)
Admission: EM | Admit: 2020-11-30 | Discharge: 2020-11-30 | Disposition: A | Discharge status: Home / Self Care | Payer: Medicare Other | Attending: Emergency Medicine | Admitting: Emergency Medicine

## 2020-11-30 DIAGNOSIS — I252 Old myocardial infarction: Secondary | ICD-10-CM | POA: Insufficient documentation

## 2020-11-30 DIAGNOSIS — I4891 Unspecified atrial fibrillation: Secondary | ICD-10-CM | POA: Diagnosis not present

## 2020-11-30 DIAGNOSIS — R55 Syncope and collapse: Secondary | ICD-10-CM | POA: Diagnosis not present

## 2020-11-30 DIAGNOSIS — Z86018 Personal history of other benign neoplasm: Secondary | ICD-10-CM | POA: Diagnosis not present

## 2020-11-30 DIAGNOSIS — R001 Bradycardia, unspecified: Secondary | ICD-10-CM

## 2020-11-30 DIAGNOSIS — I493 Ventricular premature depolarization: Secondary | ICD-10-CM

## 2020-11-30 DIAGNOSIS — R42 Dizziness and giddiness: Secondary | ICD-10-CM | POA: Insufficient documentation

## 2020-11-30 DIAGNOSIS — Z7901 Long term (current) use of anticoagulants: Secondary | ICD-10-CM | POA: Insufficient documentation

## 2020-11-30 DIAGNOSIS — R61 Generalized hyperhidrosis: Secondary | ICD-10-CM | POA: Diagnosis not present

## 2020-11-30 DIAGNOSIS — R110 Nausea: Secondary | ICD-10-CM | POA: Insufficient documentation

## 2020-11-30 DIAGNOSIS — I2510 Atherosclerotic heart disease of native coronary artery without angina pectoris: Secondary | ICD-10-CM | POA: Diagnosis not present

## 2020-11-30 DIAGNOSIS — Z955 Presence of coronary angioplasty implant and graft: Secondary | ICD-10-CM | POA: Diagnosis not present

## 2020-11-30 DIAGNOSIS — Z7982 Long term (current) use of aspirin: Secondary | ICD-10-CM | POA: Insufficient documentation

## 2020-11-30 LAB — CBG MONITORING, ED: Glucose-Capillary: 88 mg/dL (ref 70–99)

## 2020-11-30 LAB — CBC WITH DIFFERENTIAL/PLATELET
Abs Immature Granulocytes: 0.03 10*3/uL (ref 0.00–0.07)
Basophils Absolute: 0.1 10*3/uL (ref 0.0–0.1)
Basophils Relative: 1 %
Eosinophils Absolute: 0.1 10*3/uL (ref 0.0–0.5)
Eosinophils Relative: 1 %
HCT: 37.3 % — ABNORMAL LOW (ref 39.0–52.0)
Hemoglobin: 11.9 g/dL — ABNORMAL LOW (ref 13.0–17.0)
Immature Granulocytes: 0 %
Lymphocytes Relative: 11 %
Lymphs Abs: 1 10*3/uL (ref 0.7–4.0)
MCH: 26.9 pg (ref 26.0–34.0)
MCHC: 31.9 g/dL (ref 30.0–36.0)
MCV: 84.2 fL (ref 80.0–100.0)
Monocytes Absolute: 0.5 10*3/uL (ref 0.1–1.0)
Monocytes Relative: 6 %
Neutro Abs: 7.3 10*3/uL (ref 1.7–7.7)
Neutrophils Relative %: 81 %
Platelets: 267 10*3/uL (ref 150–400)
RBC: 4.43 MIL/uL (ref 4.22–5.81)
RDW: 18.4 % — ABNORMAL HIGH (ref 11.5–15.5)
WBC: 9.1 10*3/uL (ref 4.0–10.5)
nRBC: 0 % (ref 0.0–0.2)

## 2020-11-30 LAB — BASIC METABOLIC PANEL
Anion gap: 10 (ref 5–15)
BUN: 24 mg/dL — ABNORMAL HIGH (ref 8–23)
CO2: 24 mmol/L (ref 22–32)
Calcium: 8.3 mg/dL — ABNORMAL LOW (ref 8.9–10.3)
Chloride: 105 mmol/L (ref 98–111)
Creatinine, Ser: 1.73 mg/dL — ABNORMAL HIGH (ref 0.61–1.24)
GFR, Estimated: 38 mL/min — ABNORMAL LOW (ref >60)
Glucose, Bld: 99 mg/dL (ref 70–99)
Potassium: 3.7 mmol/L (ref 3.5–5.1)
Sodium: 139 mmol/L (ref 135–145)

## 2020-11-30 LAB — TROPONIN I (HIGH SENSITIVITY): Troponin I (High Sensitivity): 7 ng/L (ref <18)

## 2020-11-30 MED ORDER — HYDROCORTISONE NA SUCCINATE PF 100 MG IJ SOLR
100.0000 mg | Freq: Once | INTRAMUSCULAR | Status: AC
  Administered 2020-11-30: 100 mg via INTRAVENOUS
  Filled 2020-11-30: qty 2

## 2020-11-30 NOTE — ED Notes (Signed)
Pt assisted to sit in the chair in his room. Pt denied feeling lightheaded/dizzy. Pt steady on his feet. Gave pt call light and asked him not to get up without assistance. Pt verbalized understanding.

## 2020-11-30 NOTE — ED Provider Notes (Signed)
[facility] EMERGENCY DEPARTMENT Provider Note  CSN: [id number] Arrival date & time: 11/30/20 1056    History Chief Complaint  Patient presents with  . 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 While sitting there he began to feel lightheaded, mildly diaphoretic and nauseated but no CP or SOB. He stood up and had a brief loss of consciousness. He denies any pain or injury after the episode. He feels back to baseline now. He was noted to be bradycardic and hypotensive with EMS on the scene and was given IVF and 0.5mg  atropine without much change in HR. BP did improve. He is on Eliquis for previous PE but denies any bloody or melanic stools recently. No abdominal pain, vomiting or diarrhea. No fevers. He did not have anything to eat before going to the doctor's office this morning however CBG was normal at the scene.    Past Medical History:  Diagnosis Date  . Atrial premature beats   . BPH (benign prostatic hyperplasia)   . BPH (benign prostatic hypertrophy)   . Cataract, right eye   . Chronic low back pain   . Colon polyp   . Coronary artery disease   . DDD (degenerative disc disease), lumbar   . Dermatitis 11/16  . Elevated IgE level 06/30/2018  . Hyperlipidemia   . Insomnia   . Kidney cysts   . Memory loss 2015  . Non-ischemic cardiomyopathy ([place])   . NSTEMI (non-ST elevated myocardial infarction) ([place]) 08/25/2014  . Osteoarthritis of right knee   . Pituitary tumor 2011   macroadenoma w/ VF compromise-then gamma knife a few years leater for some recurrence  . Positive radioallergosorbent test (RAST) 06/30/2018  . Status post insertion of drug-eluting stent into left anterior descending (LAD) artery 08/28/15   + nuc study.   . Urinary retention   . Vitamin D deficiency 01/2008    Past Surgical History:  Procedure  Laterality Date  . ARTHROSCOPIC SURGERY    . BACK SURGERY    . CARDIAC CATHETERIZATION  07/27/2015   Dr Aundra Dubin; oLAD 95%, mLAD 50%, CFX stent OK, OM1 60%, OM2 99%, pPLOM 50%, RCA irregular, AM2 70%, EF 55-60%, no regional wall motion abnormalities.     Marland Kitchen CARDIAC CATHETERIZATION N/A 07/28/2015   Procedure: Left Heart Cath and Coronary Angiography;  Surgeon: [name], MD;  Location: [facility];  Service: Cardiovascular;  Laterality: N/A;  . CARDIAC CATHETERIZATION N/A 07/28/2015   Procedure: Coronary Stent Intervention;  Surgeon: Troy Sine, MD;  Location: [facility];  Service: Cardiovascular;  Laterality: N/A;  . CATARACT EXTRACTION    . CORONARY ANGIOPLASTY WITH STENT PLACEMENT  07/27/2015   XIENCE ALPINE RX 3.0X33 DES to the LAD, 95%>>0  . HAMMER  BUNION TOE SURGERY    . HAND SURGERY Left   . LEFT HEART CATHETERIZATION WITH CORONARY ANGIOGRAM N/A 08/25/2014   Procedure: LEFT HEART CATHETERIZATION WITH CORONARY ANGIOGRAM;  Surgeon: Burnell Blanks, MD;  Location: [facility] CATH LAB;  Service: Cardiovascular;  Laterality: N/A;  . PITUITARY SURGERY     12/01/09 Dr. Wilburn Cornelia and [name].  Marland Kitchen RIGHT/LEFT HEART CATH AND CORONARY ANGIOGRAPHY N/A 03/19/2018   Procedure: RIGHT/LEFT HEART CATH AND CORONARY ANGIOGRAPHY;  Surgeon: [name], MD;  Location: [facility] CV LAB;  Service: Cardiovascular;  Laterality:  N/A;  . RIGHT/LEFT HEART CATH AND CORONARY ANGIOGRAPHY N/A 03/13/2019   Procedure: RIGHT/LEFT HEART CATH AND CORONARY ANGIOGRAPHY;  Surgeon: [name], MD;  Location: [facility] CV LAB;  Service: Cardiovascular;  Laterality: N/A;    Family History  Problem Relation Age of Onset  . Congestive Heart Failure Father 41  . Other Mother 47  . Coronary artery disease Brother   . Other Sister 62       polio  . Heart attack Brother        MI/ASCAD 2006  . COPD Sister 60    Social History   Tobacco Use  . Smoking status: Never Smoker  . Smokeless  tobacco: Never Used  Vaping Use  . Vaping Use: Never used  Substance Use Topics  . Alcohol use: Not Currently  . Drug use: No     Home Medications Prior to Admission medications   Medication Sig Start Date End Date Taking? Authorizing Provider  albuterol (VENTOLIN HFA) 108 (90 Base) MCG/ACT inhaler Inhale 2 puffs into the lungs every 6 (six) hours as needed for wheezing or shortness of breath. 11/21/20   Icard, Leory Plowman L, DO  ascorbic acid (VITAMIN C) 500 MG tablet Take 1 tablet (500 mg total) by mouth daily. 10/16/19   Elie Confer, MD  aspirin EC 81 MG tablet Take 1 tablet (81 mg total) by mouth daily. 02/19/19   [name], MD  buPROPion (WELLBUTRIN XL) 150 MG 24 hr tablet Take 150 mg by mouth daily.  01/26/19   [provider]  cefdinir (OMNICEF) 300 MG capsule Take 1 capsule (300 mg total) by mouth 2 (two) times daily. 10/15/19   Elie Confer, MD  chlorpheniramine-HYDROcodone (TUSSIONEX) 10-8 MG/5ML SUER Take 5 mLs by mouth every 12 (twelve) hours as needed for cough. 10/15/19   Elie Confer, MD  ELIQUIS 2.5 MG TABS tablet TAKE 1 TABLET(2.5 MG) BY MOUTH TWICE DAILY 09/08/20   [name], MD  Evolocumab (REPATHA) 140 MG/ML SOSY Inject 1 mL into the skin every 14 (fourteen) days.     [provider]  ezetimibe (ZETIA) 10 MG tablet TAKE 1 TABLET(10 MG) BY MOUTH DAILY 11/02/20   [name], MD  fluticasone [facility]) 50 MCG/ACT nasal spray Place 2 sprays into both nostrils daily. 06/07/20   Garner Nash, DO  fluticasone-salmeterol (ADVAIR HFA) 230-21 MCG/ACT inhaler Inhale 2 puffs into the lungs 2 (two) times daily. 11/17/19   Garner Nash, DO  galantamine (RAZADYNE ER) 8 MG 24 hr capsule Take 8 mg by mouth every morning. 07/14/19   [provider]  hydrocortisone (CORTEF) 20 MG tablet Take 20-40 mg by mouth See admin instructions. 20 mg every morning, 40 mg every evening    [provider]  Ipratropium-Albuterol (COMBIVENT  RESPIMAT) 20-100 MCG/ACT AERS respimat Inhale 1 puff into the lungs every 6 (six) hours.    [provider]  levothyroxine (SYNTHROID, LEVOTHROID) 150 MCG tablet Take 150 mcg by mouth at bedtime.     [provider]  loratadine (CLARITIN) 10 MG tablet Take 10 mg by mouth daily.    [provider]  memantine (NAMENDA) 10 MG tablet Take 10 mg by mouth 2 (two) times daily. 10/21/19   [provider]  montelukast (SINGULAIR) 10 MG tablet Take 1 tablet (10 mg total) by mouth at bedtime. 07/01/18   Icard, Bradley L, DO  MYRBETRIQ 50 MG TB24 tablet Take 50 mg by mouth at bedtime.  11/22/16   [provider]  nitroGLYCERIN (NITROSTAT) 0.4 MG SL tablet Place 1 tablet (0.4 mg total) under the tongue every 5 (five) minutes as needed for chest pain. 03/10/18   [name], MD  potassium chloride (KLOR-CON) 10 MEQ tablet Take 2 tablets (20 mEq total) by mouth daily. 08/12/20   [name], MD  ranolazine (RANEXA) 500 MG 12 hr tablet TAKE 1 TABLET(500 MG) BY MOUTH TWICE DAILY 08/31/20   [name], MD  testosterone cypionate (DEPOTESTOTERONE CYPIONATE) 200 MG/ML injection Inject 100 mg into the muscle every 21 ( twenty-one) days.     [provider]  torsemide (DEMADEX) 10 MG tablet TAKE 1 TABLET(10 MG) BY MOUTH DAILY 11/07/20   Hilty, Nadean Corwin, MD  triamcinolone cream (KENALOG) 0.1 % Apply 1 application topically 2 (two) times daily as needed (irritation).     [provider]  VASCEPA 1 g capsule TAKE 2 CAPSULES(2 GRAMS) BY MOUTH TWICE DAILY 09/01/20   Hilty, Nadean Corwin, MD  zinc sulfate 220 (50 Zn) MG capsule Take 1 capsule (220 mg total) by mouth daily. 10/16/19   Elie Confer, MD  carvedilol (COREG) 12.5 MG tablet Take 1 tablet (12.5 mg total) by mouth 2 (two) times daily with a meal. 07/22/20 11/30/20  [name], MD  nebivolol (BYSTOLIC) 2.5 MG tablet TAKE 1/2 TABLET(1.25 MG) BY MOUTH DAILY 10/03/20 11/30/20  [name], MD      Allergies    Sulfa antibiotics, Sulfamethoxazole, Sulfonamide derivatives, Bee venom, Flonase [fluticasone], and Tamsulosin   Review of Systems   Review of Systems A comprehensive review of systems was completed and negative except as noted in HPI.    Physical Exam BP 139/82   Pulse (!) 44   Temp 97.7 F (36.5 C) (Oral)   Resp 12   Ht 6' 1.5" (1.867 m)   Wt 109.5 kg   SpO2 97%   BMI 31.42 kg/m   Physical Exam Vitals and nursing note reviewed.  Constitutional:      Appearance: Normal appearance.  HENT:     Head: Normocephalic and atraumatic.     Nose: Nose normal.     Mouth/Throat:     Mouth: Mucous membranes are moist.  Eyes:     Extraocular Movements: Extraocular movements intact.     Conjunctiva/sclera: Conjunctivae normal.  Cardiovascular:     Rate and Rhythm: Regular rhythm. Bradycardia present.  Pulmonary:     Effort: Pulmonary effort is normal.     Breath sounds: Normal breath sounds.  Abdominal:     General: Abdomen is flat.     Palpations: Abdomen is soft.     Tenderness: There is no abdominal tenderness.  Musculoskeletal:        General: No swelling. Normal range of motion.     Cervical back: Neck supple.  Skin:    General: Skin is warm and dry.  Neurological:     General: No focal deficit present.     Mental Status: He is alert.  Psychiatric:        Mood and Affect: Mood normal.      ED Results / Procedures / Treatments   Labs (all labs ordered are listed, but only abnormal results are displayed) Labs Reviewed  BASIC METABOLIC PANEL - Abnormal; Notable for the following components:      Result Value   BUN 24 (*)    Creatinine, Ser 1.73 (*)    Calcium 8.3 (*)    GFR,  Estimated 38 (*)    All other components within normal limits  CBC WITH DIFFERENTIAL/PLATELET - Abnormal; Notable for the following components:   Hemoglobin 11.9 (*)    HCT 37.3 (*)    RDW 18.4 (*)    All other components within normal limits  CBG MONITORING, ED   TROPONIN I (HIGH SENSITIVITY)  TROPONIN I (HIGH SENSITIVITY)    EKG EKG Interpretation  Date/Time:  Wednesday November 30 2020 11:04:14 EST Ventricular Rate:  50 PR Interval:    QRS Duration: 139 QT Interval:  480 QTC Calculation: 438 R Axis:   2 Text Interpretation: slow Atrial fibrillation vs sinus brady Nonspecific intraventricular conduction delay Since last tracing P-waves less apparent Confirmed by Calvert Cantor [phone]) on 11/30/2020 11:26:40 AM   Radiology No results found.  Procedures Procedures  Medications Ordered in the ED Medications  hydrocortisone sodium succinate (SOLU-CORTEF) 100 MG injection 100 mg (100 mg Intravenous Given 11/30/20 1317)     MDM Rules/Calculators/A&P MDM Patient arrives after a syncopal episode currently asymptomatic, noted to be bradycardic which he has had before. EMS EKG with clear P-waves, ours has some baseline artifact making interpretation difficult but it is narrow complex and regular. Will check labs and continue to monitor HR in the ED.  ED Course  I have reviewed the triage vital signs and the nursing notes.  Pertinent labs & imaging results that were available during my care of the patient were reviewed by me and considered in my medical decision making (see chart for details).  Clinical Course as of 11/30/20 1408  Wed Nov 30, 2020  1207 CBC with moderate decrease in Hgb compared to a year ago but not in need of emergent transfusion.  [CS]  1238 BMP with mild elevation in Cr above most recent labs here a year ago.Trop is neg.  [CS]  15 Spoke with [name], PCP, who expresses concern that the patient missed his corticosteroids this morning that he takes for pan-hypopituitary. Will give hydrocortisone 100mg .  [name] also is concerned about his persistent bradycardia. Will get input from Cardiology regarding whether he may need a home monitor.  [CS]  1353 Spoke with Dr. Aundra Dubin, patient's Cardiologist, who knows him well. He  recommends stopping both of his beta blockers (Carvedilol and Bystolic) unclear why he is on both. He will attempt to get a ZIO patch applied today as well.  [CS]  [id number] Spoke with patient's wife, discussed the plan and she is in agreement. Dr. Claris Gladden NP is at bedside and will take the patient to the Cardiology clinic from here to place the ZIO patch and wife can pick him up there.  [CS]    Clinical Course User Index [CS] Truddie Hidden, MD    Final Clinical Impression(s) / ED Diagnoses Final diagnoses:  Syncope, unspecified syncope type  Bradycardia    Rx / DC Orders ED Discharge Orders    None       Truddie Hidden, MD 11/30/20 1408

## 2020-11-30 NOTE — ED Triage Notes (Signed)
Pt BIB [facility] from PCP office for routine visit. Pt stood up and has a syncopal episode. Staff reported HR went from 70s to 50s, BP dropped to 60 systolic. EMS gave 5mg  atropine and 753mL of NS.

## 2020-12-01 DIAGNOSIS — I493 Ventricular premature depolarization: Secondary | ICD-10-CM | POA: Diagnosis not present

## 2020-12-08 DIAGNOSIS — R972 Elevated prostate specific antigen [PSA]: Secondary | ICD-10-CM | POA: Diagnosis not present

## 2020-12-08 DIAGNOSIS — N401 Enlarged prostate with lower urinary tract symptoms: Secondary | ICD-10-CM | POA: Diagnosis not present

## 2020-12-08 DIAGNOSIS — R351 Nocturia: Secondary | ICD-10-CM | POA: Diagnosis not present

## 2020-12-14 DIAGNOSIS — E291 Testicular hypofunction: Secondary | ICD-10-CM | POA: Diagnosis not present

## 2020-12-19 ENCOUNTER — Ambulatory Visit (HOSPITAL_COMMUNITY)
Admit: 2020-12-19 | Discharge: 2020-12-19 | Disposition: A | Discharge status: Home / Self Care | Payer: Medicare Other | Attending: Cardiology | Admitting: Cardiology

## 2020-12-19 ENCOUNTER — Encounter (HOSPITAL_COMMUNITY): Payer: Self-pay | Admitting: Cardiology

## 2020-12-19 ENCOUNTER — Other Ambulatory Visit: Payer: Self-pay

## 2020-12-19 VITALS — BP 140/90 | HR 81 | Wt 235.8 lb

## 2020-12-19 DIAGNOSIS — J45909 Unspecified asthma, uncomplicated: Secondary | ICD-10-CM | POA: Diagnosis not present

## 2020-12-19 DIAGNOSIS — Z955 Presence of coronary angioplasty implant and graft: Secondary | ICD-10-CM | POA: Insufficient documentation

## 2020-12-19 DIAGNOSIS — I5032 Chronic diastolic (congestive) heart failure: Secondary | ICD-10-CM

## 2020-12-19 DIAGNOSIS — E785 Hyperlipidemia, unspecified: Secondary | ICD-10-CM | POA: Insufficient documentation

## 2020-12-19 DIAGNOSIS — I252 Old myocardial infarction: Secondary | ICD-10-CM | POA: Diagnosis not present

## 2020-12-19 DIAGNOSIS — I472 Ventricular tachycardia, unspecified: Secondary | ICD-10-CM

## 2020-12-19 DIAGNOSIS — Z79890 Hormone replacement therapy (postmenopausal): Secondary | ICD-10-CM | POA: Insufficient documentation

## 2020-12-19 DIAGNOSIS — R001 Bradycardia, unspecified: Secondary | ICD-10-CM | POA: Diagnosis not present

## 2020-12-19 DIAGNOSIS — Z86718 Personal history of other venous thrombosis and embolism: Secondary | ICD-10-CM | POA: Insufficient documentation

## 2020-12-19 DIAGNOSIS — Z79899 Other long term (current) drug therapy: Secondary | ICD-10-CM | POA: Diagnosis not present

## 2020-12-19 DIAGNOSIS — Z7901 Long term (current) use of anticoagulants: Secondary | ICD-10-CM | POA: Insufficient documentation

## 2020-12-19 DIAGNOSIS — Z7182 Exercise counseling: Secondary | ICD-10-CM | POA: Diagnosis not present

## 2020-12-19 DIAGNOSIS — I428 Other cardiomyopathies: Secondary | ICD-10-CM | POA: Diagnosis not present

## 2020-12-19 DIAGNOSIS — F0390 Unspecified dementia without behavioral disturbance: Secondary | ICD-10-CM | POA: Insufficient documentation

## 2020-12-19 DIAGNOSIS — E2740 Unspecified adrenocortical insufficiency: Secondary | ICD-10-CM | POA: Diagnosis not present

## 2020-12-19 DIAGNOSIS — Z882 Allergy status to sulfonamides status: Secondary | ICD-10-CM | POA: Diagnosis not present

## 2020-12-19 DIAGNOSIS — Z7902 Long term (current) use of antithrombotics/antiplatelets: Secondary | ICD-10-CM | POA: Diagnosis not present

## 2020-12-19 DIAGNOSIS — I25118 Atherosclerotic heart disease of native coronary artery with other forms of angina pectoris: Secondary | ICD-10-CM | POA: Diagnosis not present

## 2020-12-19 DIAGNOSIS — I509 Heart failure, unspecified: Secondary | ICD-10-CM | POA: Diagnosis not present

## 2020-12-19 DIAGNOSIS — Z8616 Personal history of COVID-19: Secondary | ICD-10-CM | POA: Diagnosis not present

## 2020-12-19 DIAGNOSIS — Z7951 Long term (current) use of inhaled steroids: Secondary | ICD-10-CM | POA: Insufficient documentation

## 2020-12-19 DIAGNOSIS — Z91030 Bee allergy status: Secondary | ICD-10-CM | POA: Insufficient documentation

## 2020-12-19 DIAGNOSIS — Z7982 Long term (current) use of aspirin: Secondary | ICD-10-CM | POA: Diagnosis not present

## 2020-12-19 DIAGNOSIS — N183 Chronic kidney disease, stage 3 unspecified: Secondary | ICD-10-CM | POA: Insufficient documentation

## 2020-12-19 HISTORY — DX: Heart failure, unspecified: I50.9

## 2020-12-19 LAB — BASIC METABOLIC PANEL
Anion gap: 10 (ref 5–15)
BUN: 19 mg/dL (ref 8–23)
CO2: 22 mmol/L (ref 22–32)
Calcium: 9 mg/dL (ref 8.9–10.3)
Chloride: 102 mmol/L (ref 98–111)
Creatinine, Ser: 1.26 mg/dL — ABNORMAL HIGH (ref 0.61–1.24)
GFR, Estimated: 56 mL/min — ABNORMAL LOW (ref >60)
Glucose, Bld: 110 mg/dL — ABNORMAL HIGH (ref 70–99)
Potassium: 4.1 mmol/L (ref 3.5–5.1)
Sodium: 134 mmol/L — ABNORMAL LOW (ref 135–145)

## 2020-12-19 MED ORDER — AMIODARONE HCL 200 MG PO TABS: ORAL_TABLET | ORAL | 3 refills | Status: DC

## 2020-12-19 NOTE — Progress Notes (Signed)
Date:  12/19/2020   ID:  [name], [name], DOB Jul 07, 1934, MRN [id number]  Provider location: [facility] Type of Visit: Established patient   PCP:  [name], MD  Cardiologist: Dr. Aundra Dubin   History of Present Illness: [name], [name] is a 87 y.o. male who has a history of pituitary adenoma s/p resection, nonobstructive CAD, nonischemic cardiomyopathy, and chronic atrial bigeminy. Nonischemic cardiomyopathy with EF 40% was diagnosed by echo in 2014. Historically, he has not been able to tolerate statins, and he has been tried on multiple agents. He is now on Repatha.   In 11/15, he developed chest tightness and was admitted to [facility] with NSTEMI.  LHC showed 60-70% proximal LAD stenosis with totally occluded LCx.  This was opened with DES to the proximal LCx.  Echo post-cath showed EF 55-60%.  In 10/16, he developed typical angina. LHC showed 95% proximal LAD stenosis that was treated with DES.    He has had difficulty with BP-active meds in the setting of adrenal insufficiency post pituitary surgery.   Admitted 11/2016 for syncope. Thought to be in setting of adrenal insufficiency complicated by viral gastroenteritis.   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 He has also had PND-like symptoms.   Given worsening symptoms, I started him on low dose Lasix 20 mg daily. Echo in 6/19 showed EF 50-55% with inferolateral hypokinesis.  Cardiolite in 6/19 showed EF 47% with primarily fixed inferolateral defect.  I took him for RHC/LHC.  This showed nonobstructive coronary disease and minimally elevated filling pressures.  PFTs in 7/19 were unremarkable except for decreased DLCO.  Switching from Brilinta to Plavix did not help his symptoms.  He saw pulmonology and an allergist, he was noted to have a number of environmental allergies,  and there was concern that asthma was causing some of his symptoms.    In 11/19, he fell at a [facility] game and injured his right leg.  The leg began to swell soon after and he was found to have a DVT and bilateral PEs. He was started on Eliquis.   LHC/RHC in 6/20 showed distal and branch vessel disease with no PCI target, normal filling pressures and preserved cardiac output.   In 1/21, he was admitted with COVID-19 PNA and cellulitis.   He was in his PCP's office on 11/30/20 and developed nausea/diaphoresis and lightheadedness.  He stood up and passed out for a brief period.  He was taken to the ER where he was noted to be bradycardic with his HR in the 40s.  It was also noted that he was taking both Coreg and nebivolol.  Both were stopped and a 2 wk Zio patch was placed.  I do not have the full results of the Zio patch back yet, but he had at least 2 short VT runs noted.   He returns for followup of CHF and CAD.  He has had a significant functional decline over the last year.  He has had memory issues, now with mild dementia. Per his wife, he "sits around" and does not get any exercise (used to be quite active).  He continues to get short of breath with moderate exertion, this has not changed. Follows with pulmonary also, does not think that inhalers for asthma help much.  No chest pain.  No orthopnea/PND.   As  above, 2 VT runs were noted on preliminary cardiac monitor data, but he denies any episodes of lightheadedness while wearing the monitor.  He has had no lightheadedness since stopping nebivolol and Coreg. Weight is down 6 lbs. BP has been running higher off Coreg and nebivolol.   Labs (5/12): K 4.3, creatinine 1.03 Labs (2/15): K 4, creatinine 1.1, LDL 184 Labs (11/15): K 4.8, creatinine 1.07 Labs (3/16): K 4.4, creatinine 0.9 Labs (4/16): LDL 80, HDL 37 (on Repatha) Labs (5/16): HCT 42.7 Labs (10/16): creatinine 1.05 Labs (11/16): LDL 77, LDL-P 1283 Labs (6/18): LDL 119, HDL 42, K 4.1,  creatinine 1.2 Labs (12/18): LDL 70, HDL 45 Labs (6/19): K 4, creatinine 1.23, BNP 128 Labs (8/19): K 4, creatinine 1.5, HDL 41, LDL 75, hgb 14.8 Labs (9/19): K 4.2, creatinine 1.66, hgb 14.5 Labs (12/19): K 4.7, creatinine 1.7, hgb 14.5 Labs (2/20): K 3.7, creatinine 1.44, hgb 13.7, LDL 95 Labs (10/21): K 4.1, creatinine 1.5, LDL 48, HDL 40, hgb 12.6 Labs (3/22): hgb 11.9, K 3.7, creatinine 1.73  ECG (personally reviewed): NSR rate 80 with PACs.   PMH: 1. Hyperlipidemia: He has been unable to tolerate statins.  2. Chronic atrial bigeminy: x years.  3. Hypothyroidism: s/p surgery for pituitary adenoma.  4. Pituitary adenoma: s/p surgery in 3/11 for removal.  Now on thyroid replacement and hydrocortisone.  5. Cardiomyopathy: LHC (2009) with 50% mid LAD, 60% distal LAD.  Left ventriculogram (2009) with EF 40-45%.  Echo (8/13) with EF 35-40%, mild LVH.  Echo (11/14) with EF 40%, diffuse hypokinesis, normal RV.  He was tried in the past on an ACEI but apparently at that time began to have side effects from his pituitary adenoma that were thought initially to be ACEI-related.   He has been off ACEI since that time.  He has had trouble tolerating more that 1/2 tab of 3.125 mg Coreg once a day. Echo (11/15) with EF 55-60%, aortic sclerosis without significant stenosis.  - Echo (3/18): EF 60-65%, no significant valvular abnormalities.  - Echo (6/19): EF 50-55%, mild LV dilation, inferolateral hypokinesis.  6. CAD: 2009 LHC with 50% mid, 60% distal LAD. NSTEMI 11/15 with 60-70% pLAD, totally occluded LCx with DES to proximal LCx.  EF 55-60% by LV-gram.  Angina with LHC 10/16 showing 95% pLAD, 40% mLAD, 40% dLAD, 70% AM2 => treated with DES to pLAD.  - Cardiolite (6/19): EF 47%, primarily fixed inferolateral defect.  - RHC/LHC (6/19): mean RA 8, PA 31/17, mean PCWP 20, CI 2.64. 40% pLAD, patent proximal LAD stent, 60-70% distal LAD, patent LCx stent.  - RHC/LHC (6/20): Distal and branch vessel disease  with no PCI target; mean RA 5, PA 29/12, mean PCWP 12, CI 2.59.  7. Mild dementia.  8. Carotid stenosis: Carotid dopplers (1/16) with 40-59% RICA stenosis.  - Carotid dopplers (1/17): Mild plaque only.  9. PFTs (7/19): unremarkable except for decreased DLCO.  10. CT chest (7/19): No significant pulmonary disease.  11. Asthma 12. DVT/PE: 11/19, occurred after trauma to right lower leg.  13. CKD: Stage 3.  14. Negative sleep study in 7/20. 15. COVID-19 in 1/21.   16. Syncope 3/22: Possibly due to bradycardia (on 2 different beta blockers).   Current Outpatient Medications  Medication Sig Dispense Refill   albuterol (VENTOLIN HFA) 108 (90 Base) MCG/ACT inhaler Inhale 2 puffs into the lungs every 6 (six) hours as needed for wheezing or shortness of breath. 24 g 6   amiodarone (PACERONE) 200 MG tablet  Take 1 tablet (200 mg total) by mouth 2 (two) times daily for 14 days, THEN 1 tablet (200 mg total) daily. 60 tablet 3   ascorbic acid (VITAMIN C) 500 MG tablet Take 1 tablet (500 mg total) by mouth daily. 30 tablet 0   aspirin EC 81 MG tablet Take 1 tablet (81 mg total) by mouth daily. 90 tablet 3   buPROPion (WELLBUTRIN XL) 150 MG 24 hr tablet Take 150 mg by mouth daily.      ELIQUIS 2.5 MG TABS tablet TAKE 1 TABLET(2.5 MG) BY MOUTH TWICE DAILY 180 tablet 0   Evolocumab (REPATHA) 140 MG/ML SOSY Inject 1 mL into the skin every 14 (fourteen) days.      ezetimibe (ZETIA) 10 MG tablet TAKE 1 TABLET(10 MG) BY MOUTH DAILY 90 tablet 3   fluticasone (FLONASE) 50 MCG/ACT nasal spray Place 2 sprays into both nostrils daily. 16 g 5   fluticasone-salmeterol (ADVAIR HFA) 230-21 MCG/ACT inhaler Inhale 2 puffs into the lungs 2 (two) times daily. 1 Inhaler 12   galantamine (RAZADYNE ER) 8 MG 24 hr capsule Take 8 mg by mouth every morning.     hydrocortisone (CORTEF) 20 MG tablet Take 20-40 mg by mouth See admin instructions. 20 mg every morning, 40 mg every evening     levothyroxine (SYNTHROID,  LEVOTHROID) 150 MCG tablet Take 150 mcg by mouth at bedtime.      loratadine (CLARITIN) 10 MG tablet Take 10 mg by mouth daily.     memantine (NAMENDA) 10 MG tablet Take 10 mg by mouth 2 (two) times daily.     MYRBETRIQ 50 MG TB24 tablet Take 50 mg by mouth at bedtime.   0   nitroGLYCERIN (NITROSTAT) 0.4 MG SL tablet Place 1 tablet (0.4 mg total) under the tongue every 5 (five) minutes as needed for chest pain. 90 tablet 3   potassium chloride (KLOR-CON) 10 MEQ tablet Take 2 tablets (20 mEq total) by mouth daily. 180 tablet 3   ranolazine (RANEXA) 500 MG 12 hr tablet TAKE 1 TABLET(500 MG) BY MOUTH TWICE DAILY 60 tablet 2   testosterone cypionate (DEPOTESTOTERONE CYPIONATE) 200 MG/ML injection Inject 100 mg into the muscle every 21 ( twenty-one) days.      torsemide (DEMADEX) 10 MG tablet Take 10 mg by mouth every other day.     triamcinolone cream (KENALOG) 0.1 % Apply 1 application topically 2 (two) times daily as needed (irritation).      VASCEPA 1 g capsule TAKE 2 CAPSULES(2 GRAMS) BY MOUTH TWICE DAILY 120 capsule 2   zinc sulfate 220 (50 Zn) MG capsule Take 1 capsule (220 mg total) by mouth daily. 30 capsule 0   No current facility-administered medications for this encounter.   Facility-Administered Medications Ordered in Other Encounters  Medication Dose Route Frequency Provider Last Rate Last Admin   sodium chloride flush (NS) 0.9 % injection 3 mL  3 mL Intravenous Q12H [name], MD        Allergies:   Sulfa antibiotics, Sulfamethoxazole, Sulfonamide derivatives, Bee venom, Flonase [fluticasone], and Tamsulosin   Social History:  The patient  reports that he has never smoked. He has never used smokeless tobacco. He reports previous alcohol use. He reports that he does not use drugs.   Family History:  The patient's family history includes COPD (age of onset: 39) in his sister; Congestive Heart Failure (age of onset: 37) in his father; Coronary artery disease in his  brother; Heart attack in his brother;  Other (age of onset: 31) in his sister; Other (age of onset: 44) in his mother.   ROS:  Please see the history of present illness.   All other systems are personally reviewed and negative.   Exam:   BP 140/90    Pulse 81    Wt 107 kg (235 lb 12.8 oz)    SpO2 96%    BMI 30.69 kg/m  General: NAD Neck: No JVD, no thyromegaly or thyroid nodule.  Lungs: Clear to auscultation bilaterally with normal respiratory effort. CV: Nondisplaced PMI.  Heart regular S1/S2, no S3/S4, no murmur.  1+ ankle edema.  No carotid bruit.  Normal pedal pulses.  Abdomen: Soft, nontender, no hepatosplenomegaly, no distention.  Skin: Intact without lesions or rashes.  Neurologic: Alert and oriented x 3.  Psych: Normal affect. Extremities: No clubbing or cyanosis.  HEENT: Normal.    Recent Labs: 10/25/2020: B Natriuretic Peptide 346.8 11/30/2020: Hemoglobin 11.9; Platelets 267 12/19/2020: BUN 19; Creatinine, Ser 1.26; Potassium 4.1; Sodium 134  Personally reviewed   Wt Readings from Last 3 Encounters:  12/19/20 107 kg (235 lb 12.8 oz)  11/30/20 109.5 kg (241 lb 6.5 oz)  10/25/20 109.5 kg (241 lb 6.4 oz)    ASSESSMENT AND PLAN:  1. Cardiomyopathy: EF 40% with diffuse hypokinesis on 11/14 echo, but echo in 3/18 showed EF up to 60-65%.  Echo in 6/19 with EF 50-55%, mildly dilated LV.  RHC in 6/20 was well-compensated with normal filling pressures.  He has had chronic exertional dyspnea which I have had trouble explaining.  Increasing Lasix has not helped.  Inhalers have not helped, he has seen pulmonary.  It is possible that it is due to deconditioning more than anything else though asthma may play a role.   - He is off Coreg for now with bradycardia.  - Continue torsemide 10 mg qod.  BMET today.    - He needs echo, I will set this up.  2. CAD: s/p NSTEMI with DES to LCx then angina with DES to pLAD.  He has not tolerated multiple statins but is now on Repatha.  LHC was done in  6/19 due to equivocal Cardiolite and ongoing dyspnea, LHC showed no significantly obstructive disease. LHC in 6/20 showed distal and branch vessel disease, no PCI target.  He was started on ranolazine in case exertional dyspnea was an anginal equivalent. He has been noted to have short VT runs on Zio patch monitor that he just wore.  No chest pain.  - Continue ASA 81 daily.   - Continue Repatha and Zetia, good lipids in 10/21.  - With short VT runs, I will arrange for Lexiscan Cardiolite to assess for ischemia.  3. Hyperlipidemia: He has not tolerated multiple statins. Has previously failed Crestor 5 mg two times/week due to myalgias.   - Continue Repatha and Zetia, good lipids in 10/21.  4. PE/DVT: Post-traumatic DVT in 11/19.  He is on Eliquis 2.5 mg bid for long-term use.  5. Deconditioning: I suspect that much of his fatigue/dyspnea is due to deconditioning.  I encouraged regular exercise, has an exercise bike that I asked him to use daily. 6. CKD: Stage 3. Creatinine higher recently at 1.7. Arrange BMET today.  7. Bradycardia: Syncope in 3/22 may have been a bradycardic event (in the setting of underlying long-standing adrenal insufficiency). He was inadvertently taking both Coreg and nebivolol.  Now off both and HR around 80 today.  8. VT: Short runs of VT on preliminary review  of the Zio patch he just wore.  He was apparently asymptomatic with these episodes, I do not think that his syncopal episode in early 3/22 was due to VT. No chest pain to suggest ischemia.  - As above, needs echo and Cardiolite.  - I will start him on amiodarone 200 mg bid x 2 wks then 200 mg daily.  We discussed amiodarone side effects and screening.   Folllowup 3-4 weeks.   Signed, [name], MD  12/19/2020  [facility] [address] [facility] [place] [address] [phone] (office) [phone] (fax)

## 2020-12-19 NOTE — Patient Instructions (Addendum)
Start Amiodarone 200 mg Twice daily FOR 2 WEEKS ONLY, then decrease to 200 mg Daily  Labs done today, your results will be available in MyChart, we will contact you for abnormal readings.  Your physician has requested that you have an echocardiogram. Echocardiography is a painless test that uses sound waves to create images of your heart. It provides your doctor with information about the size and shape of your heart and how well your heart's chambers and valves are working. This procedure takes approximately one hour. There are no restrictions for this procedure.  Your physician has requested that you have a lexiscan myoview. For further information please visit [email or web address]. Please follow instruction sheet, as given.  Your physician recommends that you schedule a follow-up appointment in: 3-4 weeks  If you have any questions or concerns before your next appointment please send [place] a message through [place] or call our office at [phone].    TO LEAVE A MESSAGE FOR THE NURSE SELECT OPTION 2, PLEASE LEAVE A MESSAGE INCLUDING: . YOUR NAME . DATE OF BIRTH . CALL BACK NUMBER . REASON FOR CALL**this is important as we prioritize the call backs  [place] AS LONG AS YOU CALL BEFORE 4:00 PM  At the [facility], you and your health needs are our priority. As part of our continuing mission to provide you with exceptional heart care, we have created designated Provider Care Teams. These Care Teams include your primary Cardiologist (physician) and Advanced Practice Providers (APPs- Physician Assistants and Nurse Practitioners) who all work together to provide you with the care you need, when you need it.   You may see any of the following providers on your designated Care Team at your next follow up: Marland Kitchen Dr Glori Bickers . Dr [name] . Dr Vickki Muff . Darrick Grinder, NP . Lyda Jester, [place] . Audry Riles,  PharmD   Please be sure to bring in all your medications bottles to every appointment.    How to Prepare for Your Myoview Test (stress test):  1. Please do not take these medications before your test:  2. Your remaining medications may be taken with water. 3. Nothing to eat or drink, except water, 4 hours prior to arrival time.  NO caffeine/decaffeinated products, or chocolate 12 hours prior to arrival. 4. Ladies, please do not wear dresses.  Skirts or pants are approprate, please wear a short sleeve shirt. 5. NO perfume, cologne or lotion 6. Wear comfortable walking shoes.  NO HEELS! 7. Total time is 3 to 4 hours; you may want to bring reading material for the waiting time. 8. Please report to [facility] for your test  What to expect after you arrive:  Once you arrive and check in for your appointment an IV will be started in your arm.  Then the Technoligist will inject a small amount of radioactive tracer.  There will be a 1 hour waiting period after this injection.  A series of pictures will be taken of your heart following this waiting period.  You will be prepped for the stress portion of the test.  During the stress portion of your test you will either walk on a treadmill or receive a small, safe amount of radioactive tracer injected in your IV.  After the stress portion, there is a short rest period during which time your heart and blood pressure will be monitored.  After the short rest period the  Technologist will begin your second set of pictures.  Your doctor will inform you of your test results within 7-10 business days.  In preparation for your appointment, medication and supplies will be purchased.  Appointment availability is limited, so if you need to cancel or reschedule please call the office at [phone] 24 hours in advance to avoid a cancellation fee of $100.00  IF [place], [place] TECHNOLOGIST.

## 2020-12-21 ENCOUNTER — Telehealth (HOSPITAL_COMMUNITY): Payer: Self-pay

## 2020-12-21 NOTE — Telephone Encounter (Signed)
Detailed instructions left on the the patient's answering machine. Asked to call back with any questions. S.Abbe Bula EMTP

## 2020-12-21 NOTE — Addendum Note (Signed)
Encounter addended by: Micki Riley, RN on: 12/21/2020 10:26 AM  Actions taken: Imaging Exam ended

## 2020-12-22 ENCOUNTER — Other Ambulatory Visit: Payer: Self-pay

## 2020-12-22 ENCOUNTER — Ambulatory Visit (HOSPITAL_COMMUNITY): Payer: Medicare Other | Attending: Cardiology

## 2020-12-22 VITALS — Ht 73.5 in | Wt 235.0 lb

## 2020-12-22 DIAGNOSIS — R0600 Dyspnea, unspecified: Secondary | ICD-10-CM | POA: Diagnosis not present

## 2020-12-22 DIAGNOSIS — R110 Nausea: Secondary | ICD-10-CM | POA: Diagnosis not present

## 2020-12-22 DIAGNOSIS — I472 Ventricular tachycardia, unspecified: Secondary | ICD-10-CM

## 2020-12-22 DIAGNOSIS — Z955 Presence of coronary angioplasty implant and graft: Secondary | ICD-10-CM | POA: Diagnosis not present

## 2020-12-22 DIAGNOSIS — I2510 Atherosclerotic heart disease of native coronary artery without angina pectoris: Secondary | ICD-10-CM | POA: Diagnosis not present

## 2020-12-22 LAB — MYOCARDIAL PERFUSION IMAGING
LV dias vol: 106 mL (ref 62–150)
LV sys vol: 54 mL
Peak HR: 98 {beats}/min
Rest HR: 76 {beats}/min
SDS: 2
SRS: 2
SSS: 4
TID: 1.12

## 2020-12-22 MED ORDER — TECHNETIUM TC 99M TETROFOSMIN IV KIT
10.2000 | PACK | Freq: Once | INTRAVENOUS | Status: AC | PRN
  Administered 2020-12-22: 10.2 via INTRAVENOUS
  Filled 2020-12-22: qty 11

## 2020-12-22 MED ORDER — TECHNETIUM TC 99M TETROFOSMIN IV KIT
31.3000 | PACK | Freq: Once | INTRAVENOUS | Status: AC | PRN
  Administered 2020-12-22: 31.3 via INTRAVENOUS
  Filled 2020-12-22: qty 32

## 2020-12-22 MED ORDER — AMINOPHYLLINE 25 MG/ML IV SOLN
75.0000 mg | Freq: Once | INTRAVENOUS | Status: AC
  Administered 2020-12-22: 75 mg via INTRAVENOUS

## 2020-12-22 MED ORDER — REGADENOSON 0.4 MG/5ML IV SOLN
0.4000 mg | Freq: Once | INTRAVENOUS | Status: AC
  Administered 2020-12-22: 0.4 mg via INTRAVENOUS

## 2020-12-23 ENCOUNTER — Other Ambulatory Visit: Payer: Self-pay

## 2020-12-23 MED ORDER — ADVAIR HFA 230-21 MCG/ACT IN AERO: 2.0000 | INHALATION_SPRAY | Freq: Two times a day (BID) | RESPIRATORY_TRACT | 11 refills | Status: DC

## 2020-12-28 DIAGNOSIS — E291 Testicular hypofunction: Secondary | ICD-10-CM | POA: Diagnosis not present

## 2020-12-29 DIAGNOSIS — E785 Hyperlipidemia, unspecified: Secondary | ICD-10-CM | POA: Diagnosis not present

## 2020-12-29 DIAGNOSIS — R7301 Impaired fasting glucose: Secondary | ICD-10-CM | POA: Diagnosis not present

## 2020-12-29 DIAGNOSIS — I10 Essential (primary) hypertension: Secondary | ICD-10-CM | POA: Diagnosis not present

## 2020-12-29 DIAGNOSIS — N1831 Chronic kidney disease, stage 3a: Secondary | ICD-10-CM | POA: Diagnosis not present

## 2020-12-29 DIAGNOSIS — I129 Hypertensive chronic kidney disease with stage 1 through stage 4 chronic kidney disease, or unspecified chronic kidney disease: Secondary | ICD-10-CM | POA: Diagnosis not present

## 2020-12-29 DIAGNOSIS — E039 Hypothyroidism, unspecified: Secondary | ICD-10-CM | POA: Diagnosis not present

## 2020-12-30 ENCOUNTER — Other Ambulatory Visit (HOSPITAL_COMMUNITY): Payer: Self-pay | Admitting: Cardiology

## 2021-01-05 DIAGNOSIS — R262 Difficulty in walking, not elsewhere classified: Secondary | ICD-10-CM | POA: Diagnosis not present

## 2021-01-05 DIAGNOSIS — S39012D Strain of muscle, fascia and tendon of lower back, subsequent encounter: Secondary | ICD-10-CM | POA: Diagnosis not present

## 2021-01-05 DIAGNOSIS — M5136 Other intervertebral disc degeneration, lumbar region: Secondary | ICD-10-CM | POA: Diagnosis not present

## 2021-01-16 DIAGNOSIS — S39012D Strain of muscle, fascia and tendon of lower back, subsequent encounter: Secondary | ICD-10-CM | POA: Diagnosis not present

## 2021-01-16 DIAGNOSIS — M5136 Other intervertebral disc degeneration, lumbar region: Secondary | ICD-10-CM | POA: Diagnosis not present

## 2021-01-18 DIAGNOSIS — M5136 Other intervertebral disc degeneration, lumbar region: Secondary | ICD-10-CM | POA: Diagnosis not present

## 2021-01-18 DIAGNOSIS — E291 Testicular hypofunction: Secondary | ICD-10-CM | POA: Diagnosis not present

## 2021-01-18 DIAGNOSIS — R262 Difficulty in walking, not elsewhere classified: Secondary | ICD-10-CM | POA: Diagnosis not present

## 2021-01-18 DIAGNOSIS — S39012D Strain of muscle, fascia and tendon of lower back, subsequent encounter: Secondary | ICD-10-CM | POA: Diagnosis not present

## 2021-01-20 ENCOUNTER — Ambulatory Visit (HOSPITAL_BASED_OUTPATIENT_CLINIC_OR_DEPARTMENT_OTHER): Payer: Medicare Other

## 2021-01-20 ENCOUNTER — Other Ambulatory Visit: Payer: Self-pay

## 2021-01-20 ENCOUNTER — Telehealth: Payer: Self-pay | Admitting: *Deleted

## 2021-01-20 DIAGNOSIS — M47816 Spondylosis without myelopathy or radiculopathy, lumbar region: Secondary | ICD-10-CM | POA: Diagnosis not present

## 2021-01-20 DIAGNOSIS — Z20822 Contact with and (suspected) exposure to covid-19: Secondary | ICD-10-CM | POA: Diagnosis not present

## 2021-01-20 DIAGNOSIS — R519 Headache, unspecified: Secondary | ICD-10-CM | POA: Diagnosis not present

## 2021-01-20 DIAGNOSIS — M545 Low back pain, unspecified: Secondary | ICD-10-CM | POA: Diagnosis not present

## 2021-01-20 DIAGNOSIS — E2740 Unspecified adrenocortical insufficiency: Secondary | ICD-10-CM | POA: Diagnosis not present

## 2021-01-20 DIAGNOSIS — R42 Dizziness and giddiness: Secondary | ICD-10-CM | POA: Diagnosis not present

## 2021-01-20 DIAGNOSIS — I428 Other cardiomyopathies: Secondary | ICD-10-CM | POA: Diagnosis not present

## 2021-01-20 DIAGNOSIS — M19041 Primary osteoarthritis, right hand: Secondary | ICD-10-CM | POA: Diagnosis not present

## 2021-01-20 DIAGNOSIS — I472 Ventricular tachycardia, unspecified: Secondary | ICD-10-CM

## 2021-01-20 DIAGNOSIS — R102 Pelvic and perineal pain: Secondary | ICD-10-CM | POA: Diagnosis not present

## 2021-01-20 DIAGNOSIS — G9389 Other specified disorders of brain: Secondary | ICD-10-CM | POA: Diagnosis not present

## 2021-01-20 DIAGNOSIS — M79641 Pain in right hand: Secondary | ICD-10-CM | POA: Diagnosis not present

## 2021-01-20 DIAGNOSIS — R531 Weakness: Secondary | ICD-10-CM | POA: Diagnosis not present

## 2021-01-20 DIAGNOSIS — N179 Acute kidney failure, unspecified: Secondary | ICD-10-CM | POA: Diagnosis not present

## 2021-01-20 DIAGNOSIS — I130 Hypertensive heart and chronic kidney disease with heart failure and stage 1 through stage 4 chronic kidney disease, or unspecified chronic kidney disease: Secondary | ICD-10-CM | POA: Diagnosis not present

## 2021-01-20 DIAGNOSIS — M25561 Pain in right knee: Secondary | ICD-10-CM | POA: Diagnosis not present

## 2021-01-20 DIAGNOSIS — M160 Bilateral primary osteoarthritis of hip: Secondary | ICD-10-CM | POA: Diagnosis not present

## 2021-01-20 DIAGNOSIS — J9811 Atelectasis: Secondary | ICD-10-CM | POA: Diagnosis not present

## 2021-01-20 DIAGNOSIS — I5032 Chronic diastolic (congestive) heart failure: Secondary | ICD-10-CM | POA: Diagnosis not present

## 2021-01-20 DIAGNOSIS — M542 Cervicalgia: Secondary | ICD-10-CM | POA: Diagnosis not present

## 2021-01-20 LAB — ECHOCARDIOGRAM COMPLETE
Area-P 1/2: 2.68 cm2
S' Lateral: 3.8 cm

## 2021-01-20 NOTE — Telephone Encounter (Signed)
Pt was in office today for an echocardiogram. After echo wife reported to echo tech that she was concerned b/c pt wasn't acting the same, and spoke to her son who recommended they have an RN lay eyes on pt while here in the office. Went and spoke w/ wife who reports pt woke up this morning stating he couldn't count past 11 and is weak.   Pt currently in wheelchair, wife reports that he usually walks w/ his cane but too weak today. Spoke with pt, no facial/arm droop present, took him about 30 sec to think about what day it was and voice it. Wife aware that doesn't appear stroke like, but she knows pt better than we do, and he should probably be evaluated by ED/PCP/urgent care. States she left messages w/ PCP and heart failure clinic. She did not want to go to ED if possible, advised urgent care if she didn't want to go to ED. She is agreeable and taking pt to [facility] urgent care for eval

## 2021-01-22 ENCOUNTER — Inpatient Hospital Stay (HOSPITAL_COMMUNITY)
Admission: EM | Admit: 2021-01-22 | Discharge: 2021-01-26 | DRG: 683 | Disposition: A | Discharge status: Skilled Nursing Facility | Payer: Medicare Other | Attending: Internal Medicine | Admitting: Internal Medicine

## 2021-01-22 ENCOUNTER — Encounter (HOSPITAL_COMMUNITY): Payer: Self-pay | Admitting: Emergency Medicine

## 2021-01-22 ENCOUNTER — Emergency Department (HOSPITAL_COMMUNITY): Payer: Medicare Other

## 2021-01-22 ENCOUNTER — Other Ambulatory Visit: Payer: Self-pay

## 2021-01-22 DIAGNOSIS — G8929 Other chronic pain: Secondary | ICD-10-CM | POA: Diagnosis present

## 2021-01-22 DIAGNOSIS — Z7952 Long term (current) use of systemic steroids: Secondary | ICD-10-CM

## 2021-01-22 DIAGNOSIS — Z955 Presence of coronary angioplasty implant and graft: Secondary | ICD-10-CM | POA: Diagnosis not present

## 2021-01-22 DIAGNOSIS — E785 Hyperlipidemia, unspecified: Secondary | ICD-10-CM | POA: Diagnosis present

## 2021-01-22 DIAGNOSIS — Z79899 Other long term (current) drug therapy: Secondary | ICD-10-CM

## 2021-01-22 DIAGNOSIS — D631 Anemia in chronic kidney disease: Secondary | ICD-10-CM | POA: Diagnosis not present

## 2021-01-22 DIAGNOSIS — R338 Other retention of urine: Secondary | ICD-10-CM | POA: Diagnosis present

## 2021-01-22 DIAGNOSIS — F09 Unspecified mental disorder due to known physiological condition: Secondary | ICD-10-CM | POA: Diagnosis present

## 2021-01-22 DIAGNOSIS — E2740 Unspecified adrenocortical insufficiency: Secondary | ICD-10-CM | POA: Diagnosis present

## 2021-01-22 DIAGNOSIS — R0609 Other forms of dyspnea: Secondary | ICD-10-CM | POA: Diagnosis not present

## 2021-01-22 DIAGNOSIS — M160 Bilateral primary osteoarthritis of hip: Secondary | ICD-10-CM | POA: Diagnosis not present

## 2021-01-22 DIAGNOSIS — E039 Hypothyroidism, unspecified: Secondary | ICD-10-CM | POA: Diagnosis present

## 2021-01-22 DIAGNOSIS — I2510 Atherosclerotic heart disease of native coronary artery without angina pectoris: Secondary | ICD-10-CM | POA: Diagnosis present

## 2021-01-22 DIAGNOSIS — W06XXXA Fall from bed, initial encounter: Secondary | ICD-10-CM | POA: Diagnosis present

## 2021-01-22 DIAGNOSIS — I428 Other cardiomyopathies: Secondary | ICD-10-CM | POA: Diagnosis present

## 2021-01-22 DIAGNOSIS — M545 Low back pain, unspecified: Secondary | ICD-10-CM | POA: Diagnosis present

## 2021-01-22 DIAGNOSIS — E860 Dehydration: Secondary | ICD-10-CM | POA: Diagnosis present

## 2021-01-22 DIAGNOSIS — M47816 Spondylosis without myelopathy or radiculopathy, lumbar region: Secondary | ICD-10-CM | POA: Diagnosis not present

## 2021-01-22 DIAGNOSIS — S61411A Laceration without foreign body of right hand, initial encounter: Secondary | ICD-10-CM | POA: Diagnosis present

## 2021-01-22 DIAGNOSIS — Z8249 Family history of ischemic heart disease and other diseases of the circulatory system: Secondary | ICD-10-CM

## 2021-01-22 DIAGNOSIS — I472 Ventricular tachycardia: Secondary | ICD-10-CM | POA: Diagnosis present

## 2021-01-22 DIAGNOSIS — G9389 Other specified disorders of brain: Secondary | ICD-10-CM | POA: Diagnosis not present

## 2021-01-22 DIAGNOSIS — M19041 Primary osteoarthritis, right hand: Secondary | ICD-10-CM | POA: Diagnosis not present

## 2021-01-22 DIAGNOSIS — R531 Weakness: Secondary | ICD-10-CM

## 2021-01-22 DIAGNOSIS — Z882 Allergy status to sulfonamides status: Secondary | ICD-10-CM

## 2021-01-22 DIAGNOSIS — M2550 Pain in unspecified joint: Secondary | ICD-10-CM | POA: Diagnosis not present

## 2021-01-22 DIAGNOSIS — R008 Other abnormalities of heart beat: Secondary | ICD-10-CM | POA: Diagnosis present

## 2021-01-22 DIAGNOSIS — R42 Dizziness and giddiness: Secondary | ICD-10-CM | POA: Diagnosis not present

## 2021-01-22 DIAGNOSIS — J45909 Unspecified asthma, uncomplicated: Secondary | ICD-10-CM | POA: Diagnosis present

## 2021-01-22 DIAGNOSIS — Z91030 Bee allergy status: Secondary | ICD-10-CM

## 2021-01-22 DIAGNOSIS — R339 Retention of urine, unspecified: Secondary | ICD-10-CM | POA: Diagnosis not present

## 2021-01-22 DIAGNOSIS — M25561 Pain in right knee: Secondary | ICD-10-CM | POA: Diagnosis not present

## 2021-01-22 DIAGNOSIS — Z7901 Long term (current) use of anticoagulants: Secondary | ICD-10-CM

## 2021-01-22 DIAGNOSIS — I130 Hypertensive heart and chronic kidney disease with heart failure and stage 1 through stage 4 chronic kidney disease, or unspecified chronic kidney disease: Secondary | ICD-10-CM | POA: Diagnosis present

## 2021-01-22 DIAGNOSIS — F0390 Unspecified dementia without behavioral disturbance: Secondary | ICD-10-CM | POA: Diagnosis present

## 2021-01-22 DIAGNOSIS — Z86711 Personal history of pulmonary embolism: Secondary | ICD-10-CM | POA: Diagnosis not present

## 2021-01-22 DIAGNOSIS — Z9181 History of falling: Secondary | ICD-10-CM | POA: Diagnosis not present

## 2021-01-22 DIAGNOSIS — R296 Repeated falls: Secondary | ICD-10-CM | POA: Diagnosis present

## 2021-01-22 DIAGNOSIS — Z20822 Contact with and (suspected) exposure to covid-19: Secondary | ICD-10-CM | POA: Diagnosis present

## 2021-01-22 DIAGNOSIS — N401 Enlarged prostate with lower urinary tract symptoms: Secondary | ICD-10-CM | POA: Diagnosis present

## 2021-01-22 DIAGNOSIS — Z888 Allergy status to other drugs, medicaments and biological substances status: Secondary | ICD-10-CM

## 2021-01-22 DIAGNOSIS — M549 Dorsalgia, unspecified: Secondary | ICD-10-CM | POA: Diagnosis not present

## 2021-01-22 DIAGNOSIS — J9811 Atelectasis: Secondary | ICD-10-CM | POA: Diagnosis not present

## 2021-01-22 DIAGNOSIS — M542 Cervicalgia: Secondary | ICD-10-CM | POA: Diagnosis not present

## 2021-01-22 DIAGNOSIS — I5032 Chronic diastolic (congestive) heart failure: Secondary | ICD-10-CM | POA: Diagnosis present

## 2021-01-22 DIAGNOSIS — I129 Hypertensive chronic kidney disease with stage 1 through stage 4 chronic kidney disease, or unspecified chronic kidney disease: Secondary | ICD-10-CM | POA: Diagnosis not present

## 2021-01-22 DIAGNOSIS — Z7982 Long term (current) use of aspirin: Secondary | ICD-10-CM

## 2021-01-22 DIAGNOSIS — M6289 Other specified disorders of muscle: Secondary | ICD-10-CM | POA: Diagnosis not present

## 2021-01-22 DIAGNOSIS — S61411D Laceration without foreign body of right hand, subsequent encounter: Secondary | ICD-10-CM | POA: Diagnosis not present

## 2021-01-22 DIAGNOSIS — N179 Acute kidney failure, unspecified: Secondary | ICD-10-CM | POA: Diagnosis not present

## 2021-01-22 DIAGNOSIS — Z7951 Long term (current) use of inhaled steroids: Secondary | ICD-10-CM

## 2021-01-22 DIAGNOSIS — I255 Ischemic cardiomyopathy: Secondary | ICD-10-CM | POA: Diagnosis not present

## 2021-01-22 DIAGNOSIS — N1832 Chronic kidney disease, stage 3b: Secondary | ICD-10-CM | POA: Diagnosis present

## 2021-01-22 DIAGNOSIS — Z86010 Personal history of colonic polyps: Secondary | ICD-10-CM

## 2021-01-22 DIAGNOSIS — Z86718 Personal history of other venous thrombosis and embolism: Secondary | ICD-10-CM

## 2021-01-22 DIAGNOSIS — R001 Bradycardia, unspecified: Secondary | ICD-10-CM | POA: Diagnosis present

## 2021-01-22 DIAGNOSIS — Z7401 Bed confinement status: Secondary | ICD-10-CM | POA: Diagnosis not present

## 2021-01-22 DIAGNOSIS — R519 Headache, unspecified: Secondary | ICD-10-CM | POA: Diagnosis not present

## 2021-01-22 DIAGNOSIS — W19XXXA Unspecified fall, initial encounter: Secondary | ICD-10-CM

## 2021-01-22 DIAGNOSIS — I252 Old myocardial infarction: Secondary | ICD-10-CM

## 2021-01-22 DIAGNOSIS — R102 Pelvic and perineal pain: Secondary | ICD-10-CM | POA: Diagnosis not present

## 2021-01-22 DIAGNOSIS — Y92009 Unspecified place in unspecified non-institutional (private) residence as the place of occurrence of the external cause: Secondary | ICD-10-CM | POA: Diagnosis not present

## 2021-01-22 DIAGNOSIS — M6281 Muscle weakness (generalized): Secondary | ICD-10-CM | POA: Diagnosis not present

## 2021-01-22 DIAGNOSIS — M1711 Unilateral primary osteoarthritis, right knee: Secondary | ICD-10-CM | POA: Diagnosis present

## 2021-01-22 DIAGNOSIS — Z79890 Hormone replacement therapy (postmenopausal): Secondary | ICD-10-CM

## 2021-01-22 DIAGNOSIS — M79641 Pain in right hand: Secondary | ICD-10-CM | POA: Diagnosis not present

## 2021-01-22 DIAGNOSIS — I10 Essential (primary) hypertension: Secondary | ICD-10-CM | POA: Diagnosis present

## 2021-01-22 DIAGNOSIS — Z825 Family history of asthma and other chronic lower respiratory diseases: Secondary | ICD-10-CM

## 2021-01-22 DIAGNOSIS — R2681 Unsteadiness on feet: Secondary | ICD-10-CM | POA: Diagnosis not present

## 2021-01-22 LAB — RESP PANEL BY RT-PCR (FLU A&B, COVID) ARPGX2
Influenza A by PCR: NEGATIVE
Influenza B by PCR: NEGATIVE
SARS Coronavirus 2 by RT PCR: NEGATIVE

## 2021-01-22 LAB — COMPREHENSIVE METABOLIC PANEL
ALT: 28 U/L (ref 0–44)
AST: 32 U/L (ref 15–41)
Albumin: 3.3 g/dL — ABNORMAL LOW (ref 3.5–5.0)
Alkaline Phosphatase: 62 U/L (ref 38–126)
Anion gap: 11 (ref 5–15)
BUN: 29 mg/dL — ABNORMAL HIGH (ref 8–23)
CO2: 24 mmol/L (ref 22–32)
Calcium: 8.9 mg/dL (ref 8.9–10.3)
Chloride: 103 mmol/L (ref 98–111)
Creatinine, Ser: 2.14 mg/dL — ABNORMAL HIGH (ref 0.61–1.24)
GFR, Estimated: 30 mL/min — ABNORMAL LOW (ref >60)
Glucose, Bld: 102 mg/dL — ABNORMAL HIGH (ref 70–99)
Potassium: 4.6 mmol/L (ref 3.5–5.1)
Sodium: 138 mmol/L (ref 135–145)
Total Bilirubin: 0.9 mg/dL (ref 0.3–1.2)
Total Protein: 5.9 g/dL — ABNORMAL LOW (ref 6.5–8.1)

## 2021-01-22 LAB — CK: Total CK: 119 U/L (ref 49–397)

## 2021-01-22 LAB — CBC WITH DIFFERENTIAL/PLATELET
Abs Immature Granulocytes: 0.04 10*3/uL (ref 0.00–0.07)
Basophils Absolute: 0.1 10*3/uL (ref 0.0–0.1)
Basophils Relative: 1 %
Eosinophils Absolute: 0.1 10*3/uL (ref 0.0–0.5)
Eosinophils Relative: 1 %
HCT: 42.3 % (ref 39.0–52.0)
Hemoglobin: 13.7 g/dL (ref 13.0–17.0)
Immature Granulocytes: 1 %
Lymphocytes Relative: 12 %
Lymphs Abs: 1 10*3/uL (ref 0.7–4.0)
MCH: 27.2 pg (ref 26.0–34.0)
MCHC: 32.4 g/dL (ref 30.0–36.0)
MCV: 83.9 fL (ref 80.0–100.0)
Monocytes Absolute: 0.5 10*3/uL (ref 0.1–1.0)
Monocytes Relative: 5 %
Neutro Abs: 7.2 10*3/uL (ref 1.7–7.7)
Neutrophils Relative %: 80 %
Platelets: 314 10*3/uL (ref 150–400)
RBC: 5.04 MIL/uL (ref 4.22–5.81)
RDW: 20.2 % — ABNORMAL HIGH (ref 11.5–15.5)
WBC: 8.9 10*3/uL (ref 4.0–10.5)
nRBC: 0 % (ref 0.0–0.2)

## 2021-01-22 LAB — TROPONIN I (HIGH SENSITIVITY)
Troponin I (High Sensitivity): 9 ng/L (ref <18)
Troponin I (High Sensitivity): 9 ng/L (ref <18)

## 2021-01-22 LAB — BRAIN NATRIURETIC PEPTIDE: B Natriuretic Peptide: 44.6 pg/mL (ref 0.0–100.0)

## 2021-01-22 MED ORDER — AMIODARONE HCL 200 MG PO TABS
200.0000 mg | ORAL_TABLET | Freq: Every day | ORAL | Status: DC
  Administered 2021-01-23: 200 mg via ORAL
  Filled 2021-01-22: qty 1

## 2021-01-22 MED ORDER — OXYCODONE HCL 5 MG PO TABS
5.0000 mg | ORAL_TABLET | Freq: Four times a day (QID) | ORAL | Status: AC | PRN
  Administered 2021-01-23 – 2021-01-24 (×4): 5 mg via ORAL
  Filled 2021-01-22 (×4): qty 1

## 2021-01-22 MED ORDER — APIXABAN 2.5 MG PO TABS
2.5000 mg | ORAL_TABLET | Freq: Two times a day (BID) | ORAL | Status: DC
  Administered 2021-01-23 (×2): 2.5 mg via ORAL
  Filled 2021-01-22 (×3): qty 1

## 2021-01-22 MED ORDER — ONDANSETRON HCL 4 MG/2ML IJ SOLN: 4.0000 mg | Freq: Four times a day (QID) | INTRAMUSCULAR | Status: DC | PRN

## 2021-01-22 MED ORDER — ALBUTEROL SULFATE HFA 108 (90 BASE) MCG/ACT IN AERS: 2.0000 | INHALATION_SPRAY | RESPIRATORY_TRACT | Status: DC | PRN

## 2021-01-22 MED ORDER — LEVOTHYROXINE SODIUM 75 MCG PO TABS
150.0000 ug | ORAL_TABLET | Freq: Every day | ORAL | Status: DC
  Administered 2021-01-23 – 2021-01-25 (×4): 150 ug via ORAL
  Filled 2021-01-22 (×4): qty 2

## 2021-01-22 MED ORDER — ACETAMINOPHEN 325 MG PO TABS
650.0000 mg | ORAL_TABLET | Freq: Four times a day (QID) | ORAL | Status: DC | PRN
  Administered 2021-01-23 – 2021-01-25 (×4): 650 mg via ORAL
  Filled 2021-01-22 (×4): qty 2

## 2021-01-22 MED ORDER — ACETAMINOPHEN 650 MG RE SUPP: 650.0000 mg | Freq: Four times a day (QID) | RECTAL | Status: DC | PRN

## 2021-01-22 MED ORDER — LACTATED RINGERS IV SOLN: INTRAVENOUS | Status: DC

## 2021-01-22 MED ORDER — ONDANSETRON HCL 4 MG PO TABS: 4.0000 mg | ORAL_TABLET | Freq: Four times a day (QID) | ORAL | Status: DC | PRN

## 2021-01-22 MED ORDER — SENNOSIDES-DOCUSATE SODIUM 8.6-50 MG PO TABS: 1.0000 | ORAL_TABLET | Freq: Every evening | ORAL | Status: DC | PRN

## 2021-01-22 MED ORDER — EZETIMIBE 10 MG PO TABS
10.0000 mg | ORAL_TABLET | Freq: Every day | ORAL | Status: DC
  Administered 2021-01-23 – 2021-01-26 (×4): 10 mg via ORAL
  Filled 2021-01-22 (×4): qty 1

## 2021-01-22 MED ORDER — SODIUM CHLORIDE 0.9 % IV BOLUS
1000.0000 mL | Freq: Once | INTRAVENOUS | Status: AC
  Administered 2021-01-22: 1000 mL via INTRAVENOUS

## 2021-01-22 MED ORDER — MOMETASONE FURO-FORMOTEROL FUM 200-5 MCG/ACT IN AERO
2.0000 | INHALATION_SPRAY | Freq: Two times a day (BID) | RESPIRATORY_TRACT | Status: DC
  Administered 2021-01-23 – 2021-01-26 (×7): 2 via RESPIRATORY_TRACT
  Filled 2021-01-22: qty 8.8

## 2021-01-22 NOTE — ED Provider Notes (Signed)
[facility] EMERGENCY DEPARTMENT Provider Note   CSN: [id number] Arrival date & time: 01/22/21  1717     History Chief Complaint  Patient presents with  . 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 male history of BPH, CAD status post stent, here presenting with weakness and dizziness.  History as per the wife as patient appears to be slightly demented.  Patient is actually retired Pharmacist, community.  He has slowly decline over the last several weeks.  He gets very weak and dizzy with minimal exertion.  Here she had an echo done at cardiology office 2 days ago.  Patient since then has become weaker.  He had repeated falls.  He apparently fell yesterday and hit his head and his back.  He also fell again today and also hit his head again.  He also has some skin tears in the right hand.  Per the wife, his functional status has become so bad that it is hard for her to take care of him at home.  Patient is demented and unable to give much meaningful history.  Denies any vomiting or urinary symptoms.  However he has poor appetite at baseline  The history is provided by the patient and the spouse.  Level V caveat- AMS      Past Medical History:  Diagnosis Date  . Atrial premature beats   . BPH (benign prostatic hyperplasia)   . BPH (benign prostatic hypertrophy)   . Cataract, right eye   . CHF (congestive heart failure) ([place])   . Chronic low back pain   . Colon polyp   . Coronary artery disease   . DDD (degenerative disc disease), lumbar   . Dermatitis 11/16  . Elevated IgE level 06/30/2018  . Hyperlipidemia   . Insomnia   . Kidney cysts   . Memory loss 2015  . Non-ischemic cardiomyopathy ([place])   . NSTEMI (non-ST elevated myocardial infarction) ([place]) 08/25/2014  . Osteoarthritis of right knee   . Pituitary tumor 2011   macroadenoma w/ VF compromise-then gamma knife a few years leater for some recurrence  . Positive radioallergosorbent test (RAST) 06/30/2018  . Status post  insertion of drug-eluting stent into left anterior descending (LAD) artery 08/28/15   + nuc study.   . Urinary retention   . Vitamin D deficiency 01/2008    Patient Active Problem List   Diagnosis Date Noted  . Left leg cellulitis 10/13/2019  . Cellulitis 10/13/2019  . Other specified disorders of nose and nasal sinuses 09/04/2018  . Sleep disturbance 09/04/2018  . DVT (deep venous thrombosis) ([place]) 08/22/2018  . Pulmonary embolism ([place]) 08/22/2018  . CKD (chronic kidney disease) stage 3, GFR 30-59 ml/min 08/22/2018  . Anemia 08/22/2018  . BPH (benign prostatic hyperplasia) 08/22/2018  . Anxiety 08/22/2018  . HTN (hypertension) 08/22/2018  . HLD (hyperlipidemia) 08/22/2018  . Atopic asthma 08/22/2018  . VTE (venous thromboembolism) 08/21/2018  . Positive radioallergosorbent test (RAST) 06/30/2018  . Elevated IgE level 06/30/2018  . Hiatal hernia 06/27/2018  . Atypical angina ([place]) 08/08/2017  . RBBB, anterior fascicular block and incompl posterior fascicular block 08/08/2017  . Urinary retention due to benign prostatic hyperplasia 12/05/2016  . Overactive bladder 12/05/2016  . Acute adrenal insufficiency ([place]) 12/03/2016  . AKI (acute kidney injury) ([place])   . Right bundle branch block   . Orthostatic hypotension   . CAD (coronary artery disease)   . History of ST elevation myocardial infarction (STEMI)   .  H/O: pituitary tumor   . Chronic bilateral low back pain without sciatica   . Hyponatremia   . Leukocytosis   . Falls 12/02/2016  . Diarrhea 12/02/2016  . Syncope 12/02/2016  . Nasal fracture 12/02/2016  . Abnormal stress test 07/29/2015  . Angina effort   . Abnormal nuclear stress test   . NSTEMI (non-ST elevated myocardial infarction) ([place]) 08/25/2014  . Basal cell carcinoma 07/15/2014  . Open wound of nose with complication 63/87/5643  . Disease of upper respiratory system 07/15/2014  . Chronic infection of sinus 07/15/2014  . Dyssomnia 07/15/2014  .  Dermatologic disease 04/05/2014  . CA of skin 04/05/2014  . Pituitary macroadenoma with extrasellar extension ([facility]) 01/13/2014  . 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 Pituitary adenoma ([place]) 01/13/2014  . Mild cognitive disorder 01/13/2014  . PAC (premature atrial contraction) 12/10/2013  . APC (atrial premature contractions) 12/10/2013  . Lesion of pituitary gland ([place]) 08/30/2011  . HYPERCHOLESTEROLEMIA  IIA 02/10/2009  . CAD, NATIVE VESSEL 02/10/2009  . Cardiomyopathy ([place]) 02/10/2009  . BRADYCARDIA 02/10/2009  . Cardiac conduction disorder 02/10/2009  . CAD in native artery 02/10/2009    Past Surgical History:  Procedure Laterality Date  . ARTHROSCOPIC SURGERY    . BACK SURGERY    . CARDIAC CATHETERIZATION  07/27/2015   Dr Aundra Dubin; oLAD 95%, mLAD 50%, CFX stent OK, OM1 60%, OM2 99%, pPLOM 50%, RCA irregular, AM2 70%, EF 55-60%, no regional wall motion abnormalities.     Marland Kitchen CARDIAC CATHETERIZATION N/A 07/28/2015   Procedure: Left Heart Cath and Coronary Angiography;  Surgeon: [name], MD;  Location: [facility];  Service: Cardiovascular;  Laterality: N/A;  . CARDIAC CATHETERIZATION N/A 07/28/2015   Procedure: Coronary Stent Intervention;  Surgeon: Troy Sine, MD;  Location: [facility];  Service: Cardiovascular;  Laterality: N/A;  . CATARACT EXTRACTION    . CORONARY ANGIOPLASTY WITH STENT PLACEMENT  07/27/2015   XIENCE ALPINE RX 3.0X33 DES to the LAD, 95%>>0  . HAMMER  BUNION TOE SURGERY    . HAND SURGERY Left   . LEFT HEART CATHETERIZATION WITH CORONARY ANGIOGRAM N/A 08/25/2014   Procedure: LEFT HEART CATHETERIZATION WITH CORONARY ANGIOGRAM;  Surgeon: Burnell Blanks, MD;  Location: [facility] CATH LAB;  Service: Cardiovascular;  Laterality: N/A;  . PITUITARY SURGERY     12/01/09 Dr. Wilburn Cornelia and [name].  Marland Kitchen RIGHT/LEFT HEART CATH AND CORONARY ANGIOGRAPHY N/A 03/19/2018   Procedure: RIGHT/LEFT HEART CATH AND CORONARY  ANGIOGRAPHY;  Surgeon: [name], MD;  Location: [facility];  Service: Cardiovascular;  Laterality: N/A;  . RIGHT/LEFT HEART CATH AND CORONARY ANGIOGRAPHY N/A 03/13/2019   Procedure: RIGHT/LEFT HEART CATH AND CORONARY ANGIOGRAPHY;  Surgeon: [name], MD;  Location: [facility];  Service: Cardiovascular;  Laterality: N/A;       Family History  Problem Relation Age of Onset  . Congestive Heart Failure Father 80  . Other Mother 68  . Coronary artery disease Brother   . Other Sister 69       polio  . Heart attack Brother        MI/ASCAD 2006  . COPD Sister 7    Social History   Tobacco Use  . Smoking status: Never Smoker  . Smokeless tobacco: Never Used  Vaping Use  . Vaping Use: Never used  Substance Use Topics  . Alcohol use: Not Currently  . Drug use: No  Home Medications Prior to Admission medications   Medication Sig Start Date End Date Taking? Authorizing Provider  albuterol (VENTOLIN HFA) 108 (90 Base) MCG/ACT inhaler Inhale 2 puffs into the lungs every 6 (six) hours as needed for wheezing or shortness of breath. 11/21/20   Icard, Octavio Graves, DO  amiodarone (PACERONE) 200 MG tablet Take 1 tablet (200 mg total) by mouth 2 (two) times daily for 14 days, THEN 1 tablet (200 mg total) daily. 12/19/20 01/02/22  [name], MD  ascorbic acid (VITAMIN C) 500 MG tablet Take 1 tablet (500 mg total) by mouth daily. 10/16/19   Elie Confer, MD  aspirin EC 81 MG tablet Take 1 tablet (81 mg total) by mouth daily. 02/19/19   [name], MD  buPROPion (WELLBUTRIN XL) 150 MG 24 hr tablet Take 150 mg by mouth daily.  01/26/19   [provider]  ELIQUIS 2.5 MG TABS tablet TAKE 1 TABLET(2.5 MG) BY MOUTH TWICE DAILY 09/08/20   [name], MD  Evolocumab (REPATHA) 140 MG/ML SOSY Inject 1 mL into the skin every 14 (fourteen) days.     [provider]  ezetimibe (ZETIA) 10 MG tablet TAKE 1 TABLET(10 MG) BY MOUTH DAILY 11/02/20    [name], MD  fluticasone [facility]) 50 MCG/ACT nasal spray Place 2 sprays into both nostrils daily. 06/07/20   Garner Nash, DO  fluticasone-salmeterol (ADVAIR HFA) 230-21 MCG/ACT inhaler Inhale 2 puffs into the lungs 2 (two) times daily. 12/23/20   Garner Nash, DO  galantamine (RAZADYNE ER) 8 MG 24 hr capsule Take 8 mg by mouth every morning. 07/14/19   [provider]  hydrocortisone (CORTEF) 20 MG tablet Take 20-40 mg by mouth See admin instructions. 20 mg every morning, 40 mg every evening    [provider]  levothyroxine (SYNTHROID, LEVOTHROID) 150 MCG tablet Take 150 mcg by mouth at bedtime.     [provider]  loratadine (CLARITIN) 10 MG tablet Take 10 mg by mouth daily.    [provider]  memantine (NAMENDA) 10 MG tablet Take 10 mg by mouth 2 (two) times daily. 10/21/19   [provider]  MYRBETRIQ 50 MG TB24 tablet Take 50 mg by mouth at bedtime.  11/22/16   [provider]  nitroGLYCERIN (NITROSTAT) 0.4 MG SL tablet Place 1 tablet (0.4 mg total) under the tongue every 5 (five) minutes as needed for chest pain. 03/10/18   [name], MD  potassium chloride (KLOR-CON) 10 MEQ tablet Take 2 tablets (20 mEq total) by mouth daily. 08/12/20   [name], MD  ranolazine (RANEXA) 500 MG 12 hr tablet TAKE 1 TABLET(500 MG) BY MOUTH TWICE DAILY 08/31/20   [name], MD  testosterone cypionate (DEPOTESTOTERONE CYPIONATE) 200 MG/ML injection Inject 100 mg into the muscle every 21 ( twenty-one) days.     [provider]  torsemide (DEMADEX) 10 MG tablet Take 10 mg by mouth every other day.    [provider]  triamcinolone cream (KENALOG) 0.1 % Apply 1 application topically 2 (two) times daily as needed (irritation).     [provider]  VASCEPA 1 g capsule TAKE 2 CAPSULES(2 GRAMS) BY MOUTH TWICE DAILY 09/01/20   Hilty, Nadean Corwin, MD  zinc sulfate 220 (50 Zn) MG capsule Take 1 capsule (220 mg  total) by mouth daily. 10/16/19   Elie Confer, MD  carvedilol (COREG) 12.5 MG tablet Take 1 tablet (12.5 mg total) by  mouth 2 (two) times daily with a meal. 07/22/20 11/30/20  [name], MD  nebivolol (BYSTOLIC) 2.5 MG tablet TAKE 1/2 TABLET(1.25 MG) BY MOUTH DAILY 10/03/20 11/30/20  [name], MD    Allergies    Sulfa antibiotics, Sulfamethoxazole, Sulfonamide derivatives, Bee venom, Flonase [fluticasone], and Tamsulosin  Review of Systems   Review of Systems  Neurological: Positive for weakness.  Psychiatric/Behavioral: Positive for confusion.  All other systems reviewed and are negative.   Physical Exam Updated Vital Signs BP 116/73   Pulse (!) 52   Temp 97.9 F (36.6 C) (Oral)   Resp 14   Ht 6\' 3"  (1.905 m)   Wt 108 kg   SpO2 97%   BMI 29.75 kg/m   Physical Exam Vitals and nursing note reviewed.  Constitutional:      Comments: Confused, dehydrated   HENT:     Head:     Comments: Frontal scalp hematoma    Mouth/Throat:     Mouth: Mucous membranes are dry.  Eyes:     Extraocular Movements: Extraocular movements intact.     Pupils: Pupils are equal, round, and reactive to light.  Cardiovascular:     Rate and Rhythm: Normal rate and regular rhythm.     Pulses: Normal pulses.     Heart sounds: Normal heart sounds.  Pulmonary:     Effort: Pulmonary effort is normal.     Breath sounds: Normal breath sounds.  Abdominal:     General: Abdomen is flat.     Palpations: Abdomen is soft.  Musculoskeletal:     Cervical back: Normal range of motion and neck supple.     Comments: Mild lower lumbar tenderness.  Skin tear of the right second and third fingers.  There is no bony tenderness  Skin:    General: Skin is warm.     Capillary Refill: Capillary refill takes less than 2 seconds.  Neurological:     Comments: Confused and ANO x2.  Patient able to move all extremities but has difficulty following commands     ED Results / Procedures / Treatments    Labs (all labs ordered are listed, but only abnormal results are displayed) Labs Reviewed  CBC WITH DIFFERENTIAL/PLATELET - Abnormal; Notable for the following components:      Result Value   RDW 20.2 (*)    All other components within normal limits  COMPREHENSIVE METABOLIC PANEL - Abnormal; Notable for the following components:   Glucose, Bld 102 (*)    BUN 29 (*)    Creatinine, Ser 2.14 (*)    Total Protein 5.9 (*)    Albumin 3.3 (*)    GFR, Estimated 30 (*)    All other components within normal limits  URINE CULTURE  CK  URINALYSIS, ROUTINE W REFLEX MICROSCOPIC  BRAIN NATRIURETIC PEPTIDE  TROPONIN I (HIGH SENSITIVITY)  TROPONIN I (HIGH SENSITIVITY)    EKG None  Radiology DG Chest 1 View  Result Date: 01/22/2021 CLINICAL DATA:  Fall, dizziness EXAM: CHEST  1 VIEW COMPARISON:  Chest radiograph dated 10/13/2019. FINDINGS: The heart size and mediastinal contours are within normal limits. There is mild bibasilar atelectasis. There is no pleural effusion or pneumothorax. The visualized skeletal structures are unremarkable. IMPRESSION: Mild bibasilar atelectasis. Electronically Signed   By: Zerita Boers M.D.   On: 01/22/2021 18:40   DG Pelvis 1-2 Views  Result Date: 01/22/2021 CLINICAL DATA:  Fall with pain. EXAM: PELVIS - 1-2 VIEW COMPARISON:  CT abdomen pelvis dated  12/02/2016. FINDINGS: There is no evidence of pelvic fracture or diastasis. No pelvic bone lesions are seen. Mild degenerative changes are seen in both hips. Degenerative changes are seen in the lumbar spine. IMPRESSION: No acute osseous injury. Electronically Signed   By: Zerita Boers M.D.   On: 01/22/2021 18:41   CT Head Wo Contrast  Result Date: 01/22/2021 CLINICAL DATA:  Fall this morning with head and neck pain. EXAM: CT HEAD WITHOUT CONTRAST CT CERVICAL SPINE WITHOUT CONTRAST TECHNIQUE: Multidetector CT imaging of the head and cervical spine was performed following the standard protocol without intravenous  contrast. Multiplanar CT image reconstructions of the cervical spine were also generated. COMPARISON:  CT head dated 12/02/2016. FINDINGS: CT HEAD FINDINGS Brain: No evidence of acute infarction, hemorrhage, hydrocephalus, extra-axial collection or mass lesion/mass effect. There is mild cerebral volume loss with associated ex vacuo dilatation. Periventricular white matter hypoattenuation likely represents chronic small vessel ischemic disease. 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 Vascular: No hyperdense vessel or unexpected calcification. Skull: Normal. Negative for fracture or focal lesion. Sinuses/Orbits: No acute finding. Other: None. CT CERVICAL SPINE FINDINGS Alignment: Normal. Skull base and vertebrae: No acute fracture. No primary bone lesion or focal pathologic process. Soft tissues and spinal canal: No prevertebral fluid or swelling. No visible canal hematoma. Disc levels:  Severe multilevel degenerative disc and joint disease. Upper chest: Left apical scarring is partially imaged. Other: None. IMPRESSION: 1. No acute intracranial process. 2. No acute osseous injury in the cervical spine. Electronically Signed   By: Zerita Boers M.D.   On: 01/22/2021 18:39   CT Cervical Spine Wo Contrast  Result Date: 01/22/2021 CLINICAL DATA:  Fall this morning with head and neck pain. EXAM: CT HEAD WITHOUT CONTRAST CT CERVICAL SPINE WITHOUT CONTRAST TECHNIQUE: Multidetector CT imaging of the head and cervical spine was performed following the standard protocol without intravenous contrast. Multiplanar CT image reconstructions of the cervical spine were also generated. COMPARISON:  CT head dated 12/02/2016. FINDINGS: CT HEAD FINDINGS Brain: No evidence of acute infarction, hemorrhage, hydrocephalus, extra-axial collection or mass lesion/mass effect. There is mild cerebral volume loss with associated ex vacuo dilatation. Periventricular white matter hypoattenuation likely represents chronic small vessel ischemic  disease. 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 Vascular: No hyperdense vessel or unexpected calcification. Skull: Normal. Negative for fracture or focal lesion. Sinuses/Orbits: No acute finding. Other: None. CT CERVICAL SPINE FINDINGS Alignment: Normal. Skull base and vertebrae: No acute fracture. No primary bone lesion or focal pathologic process. Soft tissues and spinal canal: No prevertebral fluid or swelling. No visible canal hematoma. Disc levels:  Severe multilevel degenerative disc and joint disease. Upper chest: Left apical scarring is partially imaged. Other: None. IMPRESSION: 1. No acute intracranial process. 2. No acute osseous injury in the cervical spine. Electronically Signed   By: Zerita Boers M.D.   On: 01/22/2021 18:39   CT Lumbar Spine Wo Contrast  Result Date: 01/22/2021 CLINICAL DATA:  Worsening low back pain for greater than 1 month. Fall today. Increased confusion and weakness. EXAM: CT LUMBAR SPINE WITHOUT CONTRAST TECHNIQUE: Multidetector CT imaging of the lumbar spine was performed without intravenous contrast administration. Multiplanar CT image reconstructions were also generated. COMPARISON:  CT abdomen and pelvis 12/02/2016. Lumbar spine MRI 10/30/2007. FINDINGS: Segmentation: 5 lumbar type vertebrae. Alignment: Mild levoscoliosis with apex at L3. No significant listhesis. Vertebrae: Large hemangioma in the L2 vertebral body. Slight chronic anterior wedging of the T12 and L1 vertebral bodies, unchanged from the prior CT. No acute  fracture. Remote left L3 transverse process fracture. Diffuse osteopenia. Paraspinal and other soft tissues: Abdominal aortic atherosclerosis without aneurysm. Partially visualized large right lower pole renal cyst. Hiatal hernia. Disc levels: L1-2: Mild disc bulging, endplate spurring, and mild facet arthrosis without evidence of significant stenosis. L2-3: Disc bulging, endplate spurring, and moderate facet hypertrophy result in right greater than left  lateral recess stenosis without evidence of significant generalized spinal stenosis or neural foraminal stenosis, similar to the prior MRI. L3-4: Disc bulging, endplate spurring, and moderate facet hypertrophy. Facet hypertrophy has mildly progressed from the prior MRI, however there is no evidence of significant stenosis. L4-5: Disc narrowing and calcification. Disc bulging, endplate spurring, and severe facet hypertrophy have progressed and result in mild spinal stenosis, bilateral lateral recess stenosis, and moderate to severe bilateral neural foraminal stenosis. L5-S1: Prior left laminotomy. Fusion across the disc space. Endplate spurring and severe facet hypertrophy have progressed and result in mild right and moderate left neural foraminal stenosis without spinal stenosis. IMPRESSION: 1. No acute osseous abnormality identified in the lumbar spine. 2. Progressive lumbar disc degeneration and severe facet hypertrophy in the lower lumbar spine compared to a 2009 MRI. 3. Mild spinal stenosis and moderate to severe bilateral neural foraminal stenosis at L4-5. 4. Moderate left neural foraminal stenosis at L5-S1. 5. Aortic Atherosclerosis (ICD10-I70.0). Electronically Signed   By: Logan Bores M.D.   On: 01/22/2021 18:37   DG Knee Complete 4 Views Right  Result Date: 01/22/2021 CLINICAL DATA:  Fall with knee pain EXAM: RIGHT KNEE - COMPLETE 4+ VIEW COMPARISON:  None. FINDINGS: The patient is status post a knee arthroplasty. No acute osseous injury. No knee joint dislocation or knee joint effusion. The hardware appears intact and well aligned. IMPRESSION: No acute osseous injury. Electronically Signed   By: Zerita Boers M.D.   On: 01/22/2021 18:44   DG Hand Complete Right  Result Date: 01/22/2021 CLINICAL DATA:  Fall with hand pain EXAM: RIGHT HAND - COMPLETE 3+ VIEW COMPARISON:  None. FINDINGS: There is no evidence of fracture or dislocation. Severe degenerative changes are seen at the first carpal  metacarpal joint. IMPRESSION: No acute osseous injury. Electronically Signed   By: Zerita Boers M.D.   On: 01/22/2021 18:43    Procedures Procedures   Medications Ordered in ED Medications  sodium chloride 0.9 % bolus 1,000 mL (has no administration in time range)    ED Course  I have reviewed the triage vital signs and the nursing notes.  Pertinent labs & imaging results that were available during my care of the patient were reviewed by me and considered in my medical decision making (see chart for details).    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  Patient has 2 falls since yesterday.  Patient has been more confused as well.  Consider electrolyte abnormality versus head bleed versus infection.  Plan to get CT head and neck and x-rays and labs  7:27 PM Patient has acute renal failure with creatinine of 2.1.  CT head and neck is unremarkable.  Patient given IV fluids in the ED.  Will admit for AKI.  Patient and family is amenable to go to rehab afterwards.   Final Clinical Impression(s) / ED Diagnoses Final diagnoses:  None  Rx / DC Orders ED Discharge Orders    None       Drenda Freeze, MD 01/22/21 2311

## 2021-01-22 NOTE — H&P (Signed)
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 History is obtained from wife who is at bedside as pt has mild dementia/MCI.  Reportedly patient has had increasing weakness over the last few weeks and declining in his health status.  Wife reports that he has not been eating or drinking much over the last week to week and a half.  He complains of feeling very weak and being dizzy when he stands up and tries to ambulate.  He gets short of breath with minimal exertion.  He was seen by cardiology 2 days ago and had an echocardiogram which is reviewed and shows normal EF and left ventricular function with mild diastolic dysfunction.  He has become significantly weaker in the last 2 days according to his wife and is now not able to ambulate at all.  When he was getting out of bed he fell earlier today causing some skin tears on his right hand.  He is most concerned about the skin tears.  He fell yesterday as well and hit his head at that time.  He had no loss of consciousness or seizure activity.  Wife reports that she is not able to take care of him at home as he is not able to ambulate with his weakness.  He has not had any recent fever or illness.  He has not had any nausea, vomiting, diarrhea, urinary frequency or dysuria.  ED Course: Emergency room patient found to have an AKI with a creatinine of 2.14 from a baseline of 1.2.  He has a negative troponin level.  CBC is unremarkable.  Patient been hemodynamically stable in the emergency room.  He does have a chronic bradycardia according to his wife.  He does have history of CAD but no recent chest pain or pressure.  Hospitalist service been asked to admit for further management  assessment for rehab needs  Review of Systems:  General: Reports generalized weakness. Denies fever, chills, weight loss, night sweats.  Denies dizziness. Has decreased appetite and po intake. HENT: Denies headache, denies change in hearing, tinnitus.  Denies nasal congestion or bleeding.  Denies sore throat, sores in mouth.  Denies difficulty swallowing Eyes: Denies blurry vision, pain in eye, drainage.  Denies discoloration of eyes. Neck:  Denies swelling.  Denies pain with movement. Cardiovascular: Denies chest pain, palpitations. Denies edema. Denies orthopnea Respiratory: Denies shortness of breath, cough. Denies wheezing. Denies sputum production Gastrointestinal: Denies abdominal pain, swelling. Denies nausea, vomiting, diarrhea.  Denies melena.  Denies hematemesis. Musculoskeletal: Reports weakness and unable to ambulate due to weakness. Has had multiple falls over past few days. Denies limitation of movement.  Denies deformity or swelling. Reports chronic back pain. Genitourinary: Denies pelvic pain.  Denies urinary frequency or hesitancy.  Denies dysuria.  Skin: Denies rash.  Denies petechiae, purpura, ecchymosis. Neurological: Denies syncope. Denies seizure activity. Denies paresthesia. Denies slurred speech, drooping face.  Denies visual change. Psychiatric: Denies depression, anxiety. Denies hallucinations.  Past Medical History:  Diagnosis Date  . Atrial premature beats   . BPH (benign prostatic hyperplasia)   . BPH (benign prostatic hypertrophy)   . Cataract, right eye   . CHF (congestive heart failure) ([place])   .  Chronic low back pain   . Colon polyp   . Coronary artery disease   . DDD (degenerative disc disease), lumbar   . Dermatitis 11/16  . Elevated IgE level 06/30/2018  . Hyperlipidemia   . Insomnia   . Kidney cysts   . Memory loss 2015  . Non-ischemic cardiomyopathy ([place])   . NSTEMI (non-ST elevated myocardial infarction) ([place]) 08/25/2014  . Osteoarthritis of  right knee   . Pituitary tumor 2011   macroadenoma w/ VF compromise-then gamma knife a few years leater for some recurrence  . Positive radioallergosorbent test (RAST) 06/30/2018  . Status post insertion of drug-eluting stent into left anterior descending (LAD) artery 08/28/15   + nuc study.   . Urinary retention   . Vitamin D deficiency 01/2008    Past Surgical History:  Procedure Laterality Date  . ARTHROSCOPIC SURGERY    . BACK SURGERY    . CARDIAC CATHETERIZATION  07/27/2015   Dr Aundra Dubin; oLAD 95%, mLAD 50%, CFX stent OK, OM1 60%, OM2 99%, pPLOM 50%, RCA irregular, AM2 70%, EF 55-60%, no regional wall motion abnormalities.     Marland Kitchen CARDIAC CATHETERIZATION N/A 07/28/2015   Procedure: Left Heart Cath and Coronary Angiography;  Surgeon: [name], MD;  Location: [facility];  Service: Cardiovascular;  Laterality: N/A;  . CARDIAC CATHETERIZATION N/A 07/28/2015   Procedure: Coronary Stent Intervention;  Surgeon: Troy Sine, MD;  Location: [place] [facility];  Service: Cardiovascular;  Laterality: N/A;  . CATARACT EXTRACTION    . CORONARY ANGIOPLASTY WITH STENT PLACEMENT  07/27/2015   XIENCE ALPINE RX 3.0X33 DES to the LAD, 95%>>0  . HAMMER  BUNION TOE SURGERY    . HAND SURGERY Left   . LEFT HEART CATHETERIZATION WITH CORONARY ANGIOGRAM N/A 08/25/2014   Procedure: LEFT HEART CATHETERIZATION WITH CORONARY ANGIOGRAM;  Surgeon: Burnell Blanks, MD;  Location: [facility] C[facility];  Service: Cardiovascular;  Laterality: N/A;  . PITUITARY SURGERY     12/01/09 Dr. Wilburn Cornelia and [name].  Marland Kitchen RIGHT/LEFT HEART CATH AND CORONARY ANGIOGRAPHY N/A 03/19/2018   Procedure: RIGHT/LEFT HEART CATH AND CORONARY ANGIOGRAPHY;  Surgeon: [name], MD;  Location: [place] [facility];  Service: Cardiovascular;  Laterality: N/A;  . RIGHT/LEFT HEART CATH AND CORONARY ANGIOGRAPHY N/A 03/13/2019   Procedure: RIGHT/LEFT HEART CATH AND CORONARY ANGIOGRAPHY;  Surgeon: [name], MD;  Location: [place] [facility];  Service: Cardiovascular;  Laterality: N/A;    Social History  reports that he has never smoked. He has never used smokeless tobacco. He reports previous alcohol use. He reports that he does not use drugs.  Allergies  Allergen Reactions  . Sulfa Antibiotics Anaphylaxis, Nausea And Vomiting and Swelling  . Sulfamethoxazole Anaphylaxis, Nausea And Vomiting and Swelling  . Sulfonamide Derivatives Anaphylaxis, Nausea And Vomiting and Swelling  . Bee Venom Rash  . Flonase [Fluticasone] Other (See Comments)    Caused nose bleeds  . Tamsulosin Other (See Comments)    Unknown reaction- Patient doesn't think he is allergic (??)    Family History  Problem Relation Age of Onset  . Congestive Heart Failure Father 41  . Other Mother 73  . Coronary artery disease Brother   . Other Sister 3       polio  . Heart attack Brother        MI/ASCAD 2006  . COPD Sister 17     Prior to Admission medications   Medication Sig Start Date End Date  Taking? Authorizing Provider  albuterol (VENTOLIN HFA) 108 (90 Base) MCG/ACT inhaler Inhale 2 puffs into the lungs every 6 (six) hours as needed for wheezing or shortness of breath. 11/21/20   Icard, Octavio Graves, DO  amiodarone (PACERONE) 200 MG tablet Take 1 tablet (200 mg total) by mouth 2 (two) times daily for 14 days, THEN 1 tablet (200 mg total) daily. 12/19/20 01/02/22  [name], MD  ascorbic acid (VITAMIN C) 500 MG tablet Take 1 tablet (500 mg total) by mouth daily. 10/16/19   Elie Confer, MD  aspirin EC 81 MG tablet Take 1 tablet (81 mg total) by mouth daily. 02/19/19   [name], MD  buPROPion (WELLBUTRIN XL) 150 MG 24 hr tablet Take 150 mg by mouth daily.  01/26/19   [provider]  ELIQUIS 2.5 MG TABS tablet TAKE 1 TABLET(2.5 MG) BY MOUTH TWICE DAILY 09/08/20   [name], MD  Evolocumab (REPATHA) 140 MG/ML SOSY Inject 1 mL into the skin every 14 (fourteen) days.     [provider]  ezetimibe  (ZETIA) 10 MG tablet TAKE 1 TABLET(10 MG) BY MOUTH DAILY 11/02/20   [name], MD  fluticasone [facility]) 50 MCG/ACT nasal spray Place 2 sprays into both nostrils daily. 06/07/20   Garner Nash, DO  fluticasone-salmeterol (ADVAIR HFA) 230-21 MCG/ACT inhaler Inhale 2 puffs into the lungs 2 (two) times daily. 12/23/20   Garner Nash, DO  galantamine (RAZADYNE ER) 8 MG 24 hr capsule Take 8 mg by mouth every morning. 07/14/19   [provider]  hydrocortisone (CORTEF) 20 MG tablet Take 20-40 mg by mouth See admin instructions. 20 mg every morning, 40 mg every evening    [provider]  levothyroxine (SYNTHROID, LEVOTHROID) 150 MCG tablet Take 150 mcg by mouth at bedtime.     [provider]  loratadine (CLARITIN) 10 MG tablet Take 10 mg by mouth daily.    [provider]  memantine (NAMENDA) 10 MG tablet Take 10 mg by mouth 2 (two) times daily. 10/21/19   [provider]  MYRBETRIQ 50 MG TB24 tablet Take 50 mg by mouth at bedtime.  11/22/16   [provider]  nitroGLYCERIN (NITROSTAT) 0.4 MG SL tablet Place 1 tablet (0.4 mg total) under the tongue every 5 (five) minutes as needed for chest pain. 03/10/18   [name], MD  potassium chloride (KLOR-CON) 10 MEQ tablet Take 2 tablets (20 mEq total) by mouth daily. 08/12/20   [name], MD  ranolazine (RANEXA) 500 MG 12 hr tablet TAKE 1 TABLET(500 MG) BY MOUTH TWICE DAILY 08/31/20   [name], MD  testosterone cypionate (DEPOTESTOTERONE CYPIONATE) 200 MG/ML injection Inject 100 mg into the muscle every 21 ( twenty-one) days.     [provider]  torsemide (DEMADEX) 10 MG tablet Take 10 mg by mouth every other day.    [provider]  triamcinolone cream (KENALOG) 0.1 % Apply 1 application topically 2 (two) times daily as needed (irritation).     [provider]  VASCEPA 1 g capsule TAKE 2 CAPSULES(2 GRAMS) BY MOUTH TWICE DAILY 09/01/20   Hilty, Nadean Corwin,  MD  zinc sulfate 220 (50 Zn) MG capsule Take 1 capsule (220 mg total) by mouth daily. 10/16/19   Elie Confer, MD  carvedilol (COREG) 12.5 MG tablet Take 1 tablet (12.5 mg total) by mouth 2 (two) times daily with a meal. 07/22/20 11/30/20  [name],  MD  nebivolol (BYSTOLIC) 2.5 MG tablet TAKE 1/2 TABLET(1.25 MG) BY MOUTH DAILY 10/03/20 11/30/20  [name], MD    Physical Exam: Vitals:   01/22/21 1724 01/22/21 1725  BP: 116/73   Pulse: (!) 52   Resp: 14   Temp: 97.9 F (36.6 C)   TempSrc: Oral   SpO2: 97%   Weight:  108 kg  Height:  6\' 3"  (1.905 m)    Constitutional: NAD, calm, comfortable Vitals:   01/22/21 1724 01/22/21 1725  BP: 116/73   Pulse: (!) 52   Resp: 14   Temp: 97.9 F (36.6 C)   TempSrc: Oral   SpO2: 97%   Weight:  108 kg  Height:  6\' 3"  (1.905 m)   General: WDWN, Alert and oriented to self and place.  Eyes: EOMI, PERRL, conjunctivae normal.  Sclera nonicteric HENT:  [place]. Small abrasion on frontal scalp without bleeding, external ears normal.  Nares patent without epistasis.  Mucous membranes are dry Neck: Soft, normal range of motion, supple, no masses, no thyromegaly. Trachea midline Respiratory: clear to auscultation bilaterally, no wheezing, no crackles. Normal respiratory effort. No accessory muscle use.  Cardiovascular: Regular rhythm with bradycardia, no murmurs / rubs / gallops. No extremity edema. 2+ pedal pulses.   Abdomen: Soft, no tenderness, nondistended, no rebound or guarding. No masses palpated. No hepatosplenomegaly. Bowel sounds normoactive Musculoskeletal: FROM. Superficial skin tear on index and third dorsal finger just distal to MP joint. no cyanosis. No joint deformity upper and lower extremities.  Normal muscle tone.  Skin: Warm, dry, intact no rashes, lesions, ulcers. No induration Neurologic: CN 2-12 grossly intact. Normal speech. Sensation intact. Strength 4/5 in all extremities.   Psychiatric: Normal judgment and insight.   Normal mood.    Labs on Admission: I have personally reviewed following labs and imaging studies  CBC: Recent Labs  Lab 01/22/21 1747  WBC 8.9  NEUTROABS 7.2  HGB 13.7  HCT 42.3  MCV 83.9  PLT [id number]    Basic Metabolic Panel: Recent Labs  Lab 01/22/21 1747  NA 138  K 4.6  CL 103  CO2 24  GLUCOSE 102*  BUN 29*  CREATININE 2.14*  CALCIUM 8.9    GFR: Estimated Creatinine Clearance: 34.1 mL/min (A) (by C-G formula based on SCr of 2.14 mg/dL (H)).  Liver Function Tests: Recent Labs  Lab 01/22/21 1747  AST 32  ALT 28  ALKPHOS 62  BILITOT 0.9  PROT 5.9*  ALBUMIN 3.3*    Urine analysis:    Component Value Date/Time   COLORURINE YELLOW 12/02/2016 1215   APPEARANCEUR CLEAR 12/02/2016 1215   LABSPEC 1.017 12/02/2016 1215   PHURINE 5.0 12/02/2016 1215   GLUCOSEU NEGATIVE 12/02/2016 1215   HGBUR SMALL (A) 12/02/2016 1215   [place] 12/02/2016 1215   KETONESUR NEGATIVE 12/02/2016 1215   PROTEINUR NEGATIVE 12/02/2016 1215   UROBILINOGEN 0.2 02/13/2011 1100   NITRITE NEGATIVE 12/02/2016 1215   LEUKOCYTESUR NEGATIVE 12/02/2016 1215    Radiological Exams on Admission: DG Chest 1 View  Result Date: 01/22/2021 CLINICAL DATA:  Fall, dizziness EXAM: CHEST  1 VIEW COMPARISON:  Chest radiograph dated 10/13/2019. FINDINGS: The heart size and mediastinal contours are within normal limits. There is mild bibasilar atelectasis. There is no pleural effusion or pneumothorax. The visualized skeletal structures are unremarkable. IMPRESSION: Mild bibasilar atelectasis. Electronically Signed   By: Zerita Boers M.D.   On: 01/22/2021 18:40   DG Pelvis 1-2 Views  Result Date: 01/22/2021 CLINICAL DATA:  Fall with pain. EXAM: PELVIS - 1-2 VIEW COMPARISON:  CT abdomen pelvis dated 12/02/2016. FINDINGS: There is no evidence of pelvic fracture or diastasis. No pelvic bone lesions are seen. Mild degenerative changes are seen in both hips. Degenerative changes are seen in the  lumbar spine. IMPRESSION: No acute osseous injury. Electronically Signed   By: Romona Curls M.D.   On: 01/22/2021 18:41   CT Head Wo Contrast  Result Date: 01/22/2021 CLINICAL DATA:  Fall this morning with head and neck pain. EXAM: CT HEAD WITHOUT CONTRAST CT CERVICAL SPINE WITHOUT CONTRAST TECHNIQUE: Multidetector CT imaging of the head and cervical spine was performed following the standard protocol without intravenous contrast. Multiplanar CT image reconstructions of the cervical spine were also generated. COMPARISON:  CT head dated 12/02/2016. FINDINGS: CT HEAD FINDINGS Brain: No evidence of acute infarction, hemorrhage, hydrocephalus, extra-axial collection or mass lesion/mass effect. There is mild cerebral volume loss with associated ex vacuo dilatation. Periventricular white matter hypoattenuation likely represents chronic small vessel ischemic disease. 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 Vascular: No hyperdense vessel or unexpected calcification. Skull: Normal. Negative for fracture or focal lesion. Sinuses/Orbits: No acute finding. Other: None. CT CERVICAL SPINE FINDINGS Alignment: Normal. Skull base and vertebrae: No acute fracture. No primary bone lesion or focal pathologic process. Soft tissues and spinal canal: No prevertebral fluid or swelling. No visible canal hematoma. Disc levels:  Severe multilevel degenerative disc and joint disease. Upper chest: Left apical scarring is partially imaged. Other: None. IMPRESSION: 1. No acute intracranial process. 2. No acute osseous injury in the cervical spine. Electronically Signed   By: Romona Curls M.D.   On: 01/22/2021 18:39   CT Cervical Spine Wo Contrast  Result Date: 01/22/2021 CLINICAL DATA:  Fall this morning with head and neck pain. EXAM: CT HEAD WITHOUT CONTRAST CT CERVICAL SPINE WITHOUT CONTRAST TECHNIQUE: Multidetector CT imaging of the head and cervical spine was performed following the standard protocol without intravenous contrast.  Multiplanar CT image reconstructions of the cervical spine were also generated. COMPARISON:  CT head dated 12/02/2016. FINDINGS: CT HEAD FINDINGS Brain: No evidence of acute infarction, hemorrhage, hydrocephalus, extra-axial collection or mass lesion/mass effect. There is mild cerebral volume loss with associated ex vacuo dilatation. Periventricular white matter hypoattenuation likely represents chronic small vessel ischemic disease. 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 Vascular: No hyperdense vessel or unexpected calcification. Skull: Normal. Negative for fracture or focal lesion. Sinuses/Orbits: No acute finding. Other: None. CT CERVICAL SPINE FINDINGS Alignment: Normal. Skull base and vertebrae: No acute fracture. No primary bone lesion or focal pathologic process. Soft tissues and spinal canal: No prevertebral fluid or swelling. No visible canal hematoma. Disc levels:  Severe multilevel degenerative disc and joint disease. Upper chest: Left apical scarring is partially imaged. Other: None. IMPRESSION: 1. No acute intracranial process. 2. No acute osseous injury in the cervical spine. Electronically Signed   By: Romona Curls M.D.   On: 01/22/2021 18:39   CT Lumbar Spine Wo Contrast  Result Date: 01/22/2021 CLINICAL DATA:  Worsening low back pain for greater than 1 month. Fall today. Increased confusion and weakness. EXAM: CT LUMBAR SPINE WITHOUT CONTRAST TECHNIQUE: Multidetector CT imaging of the lumbar spine was performed without intravenous contrast administration. Multiplanar CT image reconstructions were also generated. COMPARISON:  CT abdomen and pelvis 12/02/2016. Lumbar spine MRI 10/30/2007. FINDINGS: Segmentation: 5 lumbar type vertebrae. Alignment: Mild levoscoliosis with apex at L3. No significant listhesis. Vertebrae: Large hemangioma in the L2 vertebral body. Slight chronic anterior wedging  of the T12 and L1 vertebral bodies, unchanged from the prior CT. No acute fracture. Remote left L3  transverse process fracture. Diffuse osteopenia. Paraspinal and other soft tissues: Abdominal aortic atherosclerosis without aneurysm. Partially visualized large right lower pole renal cyst. Hiatal hernia. Disc levels: L1-2: Mild disc bulging, endplate spurring, and mild facet arthrosis without evidence of significant stenosis. L2-3: Disc bulging, endplate spurring, and moderate facet hypertrophy result in right greater than left lateral recess stenosis without evidence of significant generalized spinal stenosis or neural foraminal stenosis, similar to the prior MRI. L3-4: Disc bulging, endplate spurring, and moderate facet hypertrophy. Facet hypertrophy has mildly progressed from the prior MRI, however there is no evidence of significant stenosis. L4-5: Disc narrowing and calcification. Disc bulging, endplate spurring, and severe facet hypertrophy have progressed and result in mild spinal stenosis, bilateral lateral recess stenosis, and moderate to severe bilateral neural foraminal stenosis. L5-S1: Prior left laminotomy. Fusion across the disc space. Endplate spurring and severe facet hypertrophy have progressed and result in mild right and moderate left neural foraminal stenosis without spinal stenosis. IMPRESSION: 1. No acute osseous abnormality identified in the lumbar spine. 2. Progressive lumbar disc degeneration and severe facet hypertrophy in the lower lumbar spine compared to a 2009 MRI. 3. Mild spinal stenosis and moderate to severe bilateral neural foraminal stenosis at L4-5. 4. Moderate left neural foraminal stenosis at L5-S1. 5. Aortic Atherosclerosis (ICD10-I70.0). Electronically Signed   By: Logan Bores M.D.   On: 01/22/2021 18:37   DG Knee Complete 4 Views Right  Result Date: 01/22/2021 CLINICAL DATA:  Fall with knee pain EXAM: RIGHT KNEE - COMPLETE 4+ VIEW COMPARISON:  None. FINDINGS: The patient is status post a knee arthroplasty. No acute osseous injury. No knee joint dislocation or knee  joint effusion. The hardware appears intact and well aligned. IMPRESSION: No acute osseous injury. Electronically Signed   By: Zerita Boers M.D.   On: 01/22/2021 18:44   DG Hand Complete Right  Result Date: 01/22/2021 CLINICAL DATA:  Fall with hand pain EXAM: RIGHT HAND - COMPLETE 3+ VIEW COMPARISON:  None. FINDINGS: There is no evidence of fracture or dislocation. Severe degenerative changes are seen at the first carpal metacarpal joint. IMPRESSION: No acute osseous injury. Electronically Signed   By: Zerita Boers M.D.   On: 01/22/2021 18:43    EKG: Independently reviewed.  EKG shows normal sinus rhythm with anterior lateral mild ST depression but no acute ST elevation.  QTc 446.  On telemetry monitoring patient has bradycardia with heart rate in the 54-60 range  Assessment/Plan Principal Problem:   AKI (acute kidney injury)  [name] has an acute kidney injury.  Wife reports that he is not been eating or drinking much recently.  She also states she has been giving him Aleve once a day which may have contributed. Hold nephrotoxic medications. Hydration with LR at 100 ml/hr overnight. Recheck electrolytes and renal function in morning  Active Problems:   Generalized weakness Consult PT for evaluation to determine rehab needs.  Fall precautions. Up with assistance only.    CAD (coronary artery disease) Chronic    Chronic bilateral low back pain without sciatica Chronic. Oxycodone as needed for pain as needed.     Bradycardia Stable. Monitor. Pt had recent Echo which showed normal EF and LV function. Has mild diastolic dysfunction.     Skin tear of right hand without complication Keep bandaged and clean.     Mild cognitive disorder Chronic.  Home medication will  be reconciled and verified by pharmacy and then resumed.  Patient has both Namenda and loratadine ER on his medication list    Fall at home, initial encounter PT evaluation and morning.  Fall precautions    DVT  prophylaxis: Pt is anticoagulated on Eliquis which is continued. Code Status:   Full Code  Family Communication:      Diagnosis and plan discussed with patient and his wife who is at bedside.  Questions answered.  They agree with plan.  Further recommendations to follow as clinically indicated Disposition Plan:   Patient is from:  Home  Anticipated DC to:  Home with home health versus rehab which will be determined during      hospitalization  Anticipated DC date:  Anticipate 2 midnight or more stay in the hospital  Anticipated DC barriers: Barriers to discharge would be if a rehab bed is to be required may take      time to find an accepting bed  Admission status:  Inpatient  Yevonne Aline Kalimah Capurro MD [facility]  How to contact the [facility] Attending or Consulting provider [place] or covering provider during after hours [place], for this patient?   1. Check the care team in [facility] and look for a) attending/consulting [facility] provider listed and b) the [facility] team listed 2. Log into [email or web address] and use 's universal password to access. If you do not have the password, please contact the hospital operator. 3. Locate the [facility] provider you are looking for under [facility] and page to a number that you can be directly reached. 4. If you still have difficulty reaching the provider, please page the [facility] (Director on Call) for the Hospitalists listed on amion for assistance.  01/22/2021, 8:16 PM

## 2021-01-22 NOTE — ED Triage Notes (Signed)
Patient BIB [facility] from home. Complaint of a fall this morning. Family reports decline in health recently. Complaint of dizziness today. VSS. A&Ox4.

## 2021-01-23 ENCOUNTER — Telehealth (HOSPITAL_COMMUNITY): Payer: Self-pay | Admitting: Cardiology

## 2021-01-23 ENCOUNTER — Encounter (HOSPITAL_COMMUNITY): Payer: Medicare Other | Admitting: Cardiology

## 2021-01-23 LAB — URINALYSIS, ROUTINE W REFLEX MICROSCOPIC
Bilirubin Urine: NEGATIVE
Glucose, UA: NEGATIVE mg/dL
Hgb urine dipstick: NEGATIVE
Ketones, ur: 5 mg/dL — AB
Leukocytes,Ua: NEGATIVE
Nitrite: NEGATIVE
Protein, ur: NEGATIVE mg/dL
Specific Gravity, Urine: 1.011 (ref 1.005–1.030)
pH: 5 (ref 5.0–8.0)

## 2021-01-23 LAB — BASIC METABOLIC PANEL
Anion gap: 7 (ref 5–15)
BUN: 24 mg/dL — ABNORMAL HIGH (ref 8–23)
CO2: 26 mmol/L (ref 22–32)
Calcium: 8.7 mg/dL — ABNORMAL LOW (ref 8.9–10.3)
Chloride: 106 mmol/L (ref 98–111)
Creatinine, Ser: 1.94 mg/dL — ABNORMAL HIGH (ref 0.61–1.24)
GFR, Estimated: 34 mL/min — ABNORMAL LOW (ref >60)
Glucose, Bld: 74 mg/dL (ref 70–99)
Potassium: 3.6 mmol/L (ref 3.5–5.1)
Sodium: 139 mmol/L (ref 135–145)

## 2021-01-23 MED ORDER — METHOCARBAMOL 500 MG PO TABS
500.0000 mg | ORAL_TABLET | Freq: Three times a day (TID) | ORAL | Status: DC | PRN
  Administered 2021-01-23 – 2021-01-25 (×3): 500 mg via ORAL
  Filled 2021-01-23 (×3): qty 1

## 2021-01-23 MED ORDER — CABERGOLINE 0.5 MG PO TABS
0.5000 mg | ORAL_TABLET | ORAL | Status: DC
  Filled 2021-01-23: qty 1

## 2021-01-23 MED ORDER — AMIODARONE HCL 200 MG PO TABS
100.0000 mg | ORAL_TABLET | Freq: Every day | ORAL | Status: DC
  Administered 2021-01-24 – 2021-01-26 (×3): 100 mg via ORAL
  Filled 2021-01-23 (×3): qty 1

## 2021-01-23 MED ORDER — HYDROXYZINE HCL 25 MG PO TABS
25.0000 mg | ORAL_TABLET | Freq: Once | ORAL | Status: AC
  Administered 2021-01-23: 25 mg via ORAL
  Filled 2021-01-23: qty 1

## 2021-01-23 MED ORDER — BUPROPION HCL ER (XL) 150 MG PO TB24
150.0000 mg | ORAL_TABLET | Freq: Every day | ORAL | Status: DC
  Administered 2021-01-23 – 2021-01-26 (×4): 150 mg via ORAL
  Filled 2021-01-23 (×4): qty 1

## 2021-01-23 MED ORDER — HYDROCORTISONE 20 MG PO TABS
40.0000 mg | ORAL_TABLET | Freq: Every day | ORAL | Status: DC
  Administered 2021-01-23 – 2021-01-26 (×4): 40 mg via ORAL
  Filled 2021-01-23 (×5): qty 2

## 2021-01-23 MED ORDER — ASPIRIN EC 81 MG PO TBEC
81.0000 mg | DELAYED_RELEASE_TABLET | Freq: Every day | ORAL | Status: DC
  Administered 2021-01-23 – 2021-01-26 (×4): 81 mg via ORAL
  Filled 2021-01-23 (×4): qty 1

## 2021-01-23 MED ORDER — SODIUM CHLORIDE 0.9 % IV BOLUS
1000.0000 mL | Freq: Once | INTRAVENOUS | Status: AC
  Administered 2021-01-23: 1000 mL via INTRAVENOUS

## 2021-01-23 MED ORDER — HYDROCORTISONE 20 MG PO TABS
20.0000 mg | ORAL_TABLET | Freq: Every day | ORAL | Status: DC
  Administered 2021-01-23 – 2021-01-25 (×3): 20 mg via ORAL
  Filled 2021-01-23 (×4): qty 1

## 2021-01-23 MED ORDER — EVOLOCUMAB 140 MG/ML ~~LOC~~ SOSY: 1.0000 mL | PREFILLED_SYRINGE | SUBCUTANEOUS | Status: DC

## 2021-01-23 NOTE — Progress Notes (Signed)
Physical Therapy Note   Eval complete with note to follow;   Recommend post-acute rehab at SNF level to maximize independence and safety with mobility and allow for safe dc home;   Will follow,   Roney Marion, PT  Acute Rehabilitation Services Pager [phone] Office [phone]

## 2021-01-23 NOTE — Telephone Encounter (Signed)
Pt wife canceled appt, he is in the hospital, Mrs Haran would like nurse to call back to discuss test results, she can be reached [phone]. Thanks

## 2021-01-23 NOTE — Evaluation (Signed)
Physical Therapy Evaluation Patient Details Name: [name], [name] MRN: [id number] DOB: 23-Dec-1933 Today's Date: 01/23/2021   History of Present Illness  [name], [name] is a 87 y.o. male with medical history significant for MCI, HTN, CAD s/p stents, asthma, BPH who presents after fall at home, and progressive weakness, fatigue leading to inability to walk  Clinical Impression   Pt admitted with above diagnosis. Lives at home with wife in a multilevel home with 2 steps to enter; Able to stay mainly on main level; Was walking with a cane before onset of decline that lead to fall and this hospital admission; Presents to PT with generalized weakness, high fall risk, dec independence and safety with mobiltiy and ADLs; Still, showing improvement compared to day of admission, and he has good rehab potential;  Pt currently with functional limitations due to the deficits listed below (see PT Problem List). Pt will benefit from skilled PT to increase their independence and safety with mobility to allow discharge to the venue listed below.       Follow Up Recommendations SNF    Equipment Recommendations  Rolling walker with 5" wheels;3in1 (PT)    Recommendations for Other Services       Precautions / Restrictions Precautions Precautions: Fall      Mobility  Bed Mobility Overal bed mobility: Needs Assistance Bed Mobility: Supine to Sit     Supine to sit: Min assist     General bed mobility comments: Slwo moving; min handheld assist to pull to sit    Transfers Overall transfer level: Needs assistance Equipment used: Rolling walker (2 wheeled) Transfers: Sit to/from Stand Sit to Stand: Mod assist;Min assist         General transfer comment: Mod assist to power up from low bed; min assist to stand from elevated bed; good use of armrests to stand from recliner  Ambulation/Gait Ambulation/Gait assistance: Min assist (and chair pulled behind) Gait Distance (Feet): 28  Feet Assistive device: Rolling walker (2 wheeled) Gait Pattern/deviations: Step-through pattern;Decreased step length - right;Decreased step length - left;Trunk flexed     General Gait Details: Cues to self-monitor for activity tolerance; chair pulle dbehind for safety  Stairs            Wheelchair Mobility    Modified Rankin (Stroke Patients Only)       Balance Overall balance assessment: Needs assistance   Sitting balance-Leahy Scale: Fair       Standing balance-Leahy Scale: Poor                               Pertinent Vitals/Pain Pain Assessment: No/denies pain    Home Living Family/patient expects to be discharged to:: Private residence Living Arrangements: Spouse/significant other Available Help at Discharge: Family;Available 24 hours/day (at or near 24 hour assist; wife is petite) Type of Home: House Home Access: Stairs to enter Entrance Stairs-Rails: None Entrance Stairs-Number of Steps: 2 Home Layout: Multi-level;Able to live on main level with bedroom/bathroom Home Equipment: Walker - 2 wheels      Prior Function Level of Independence: Independent with assistive device(s);Needs assistance   Gait / Transfers Assistance Needed: Needed to use RW for amb leading up to this admission  ADL's / Homemaking Assistance Needed: Assist form wife        Hand Dominance        Extremity/Trunk Assessment   Upper Extremity Assessment Upper Extremity Assessment: Generalized weakness  Lower Extremity Assessment Lower Extremity Assessment: Generalized weakness    Cervical / Trunk Assessment Cervical / Trunk Assessment: Kyphotic  Communication   Communication: No difficulties;Other (comment) (Occasionally needed to repeat cues)  Cognition Arousal/Alertness: Awake/alert Behavior During Therapy: WFL for tasks assessed/performed Overall Cognitive Status: History of cognitive impairments - at baseline                                  General Comments: Wife reports functional decline starting around January of this year      General Comments      Exercises     Assessment/Plan    PT Assessment Patient needs continued PT services  PT Problem List Decreased strength;Decreased activity tolerance;Decreased balance;Decreased mobility;Decreased coordination;Decreased cognition;Decreased knowledge of use of DME;Decreased safety awareness;Decreased knowledge of precautions;Pain       PT Treatment Interventions DME instruction;Gait training;Functional mobility training;Therapeutic activities;Therapeutic exercise;Balance training;Neuromuscular re-education;Cognitive remediation;Patient/family education    PT Goals (Current goals can be found in the Care Plan section)  Acute Rehab PT Goals Patient Stated Goal: Get stronger, better endurance with walking PT Goal Formulation: With patient/family Time For Goal Achievement: 02/06/21 Potential to Achieve Goals: Good    Frequency Min 2X/week   Barriers to discharge        Co-evaluation               AM-PAC PT "6 Clicks" Mobility  Outcome Measure Help needed turning from your back to your side while in a flat bed without using bedrails?: A Little Help needed moving from lying on your back to sitting on the side of a flat bed without using bedrails?: A Little Help needed moving to and from a bed to a chair (including a wheelchair)?: A Lot Help needed standing up from a chair using your arms (e.g., wheelchair or bedside chair)?: A Lot Help needed to walk in hospital room?: A Little Help needed climbing 3-5 steps with a railing? : Total 6 Click Score: 14    End of Session Equipment Utilized During Treatment: Gait belt Activity Tolerance: Patient tolerated treatment well Patient left: in chair;with chair alarm set;with family/visitor present Nurse Communication: Mobility status PT Visit Diagnosis: Unsteadiness on feet (R26.81);Muscle weakness (generalized)  (M62.81);History of falling (Z91.81)    Time: 2025-4270 PT Time Calculation (min) (ACUTE ONLY): 28 min   Charges:   PT Evaluation $PT Eval Moderate Complexity: 1 Mod PT Treatments $Gait Training: 8-22 mins        Roney Marion, PT  Acute Rehabilitation Services Pager [phone] Office [phone]   Colletta [place] 01/23/2021, 5:36 PM

## 2021-01-23 NOTE — Progress Notes (Signed)
PROGRESS NOTE  [name], [name]  DOB: [birth year]  PCP: [name], MD LFY:[id number]  DOA: 01/22/2021  LOS: 1 day   Chief Complaint  Patient presents with  . Fall   Brief narrative: [name], [name] is a 87 y.o. male with PMH significant for pituitary adenoma s/p resection, with resultant adrenal insufficiency, hypothyroidism; HTN, CAD with stents, nonischemic cardiomyopathy and chronic atrial bigeminy.  Brought to the ED on 4/24 with complaint of progressively increasing weakness for last few weeks, poor oral intake, orthostatic dizziness, leading to a fall, hit his head but did not have loss of consciousness. Per family, patient has a significant functional decline over the last year.  He does not get around as he used to.  Has mild cognitive impairment. Per previous chart, 11/30/2020, patient had nausea, diaphoresis, lightheadedness and briefly passed out.  He was seen in the ER and found to be bradycardic in the 40s.  Coreg and Nebules were stopped.  Zio patch was placed for 2 weeks which showed 2 runs of V. Tach. Patient follow-up with cardiologist Dr. Aundra Dubin on 3/21.  Patient was started on amiodarone. Patient continued to have progressive weakness, he also had a fall and is brought to the ED  In the ED, patient had low heart rate in the 50s, has chronic bradycardia, hemodynamically stable. Labs noted AKI with a creatinine of 2.14, baseline 1.2 Admitted to hospital service for further evaluation management  Subjective: Patient was seen and examined this morning. pleasant, elderly Caucasian male.  Not in physical distress.  Wife at bedside.  Patient states he had 10 out of 10 right lower back pain last night.  Feels better this morning. Chart reviewed Afebrile, heart rate in 60s this morning, blood pressure in 150s  Assessment/Plan: Fall, progressive generalized weakness -Probably progression of dementia at home. -May also be related to persistent bradycardia. -May need  placement. -PT eval.  AKI on CKD 3b -Baseline creatinine less than 1.8 over the years. -Presented with creatinine elevated to 2.14, primarily due to poor oral intake, use of diuretics and bradycardia -Continue LR -Creatinine trend as below, slightly better today than yesterday at presentation. Recent Labs    10/25/20 1435 11/30/20 1109 12/19/20 1208 01/22/21 1747 01/23/21 0703  BUN 18 24* 19 29* 24*  CREATININE 1.48* 1.73* 1.26* 2.14* 1.94*   History of atrial bigeminy/V. Tach -Per cardiology note from 3/21, patient was started on amiodarone.  Currently on maintenance dose of 20 mg daily. -11/30/2020, noted to be bradycardic in the ED.  Taken off Coreg and nebivolol.  CAD s/p stents Hyperlipidemia/statin intolerance -Aspirin, Ranexa, Repatha, Zetia  Cardiomyopathy -Echo on 4/22 with EF 50 to 55% which is better than previous echo, grade 1 diastolic dysfunction -He was on torsemide 10 mg every other day at home.  Currently on hold because of dehydration.  History of DVT/PE -He had posttraumatic DVT in 11/19.  On Eliquis 2.5 mill twice daily for long-term use.  Chronic progressive dyspnea -Seen by cardiology and pulmonary in the past.  No clear etiology.  Probably due to progressive deconditioning  Chronic bilateral low back pain without sciatica -Continue oxycodone as needed for pain as needed.  -Add Robaxin   Mild dementia -Continue reorientation effort. -Continue to monitor mental status stable. -Continue Namenda, Galantamine every morning, Wellbutrin 150 mg daily  History of pituitary adenoma s/p resection with resultant hypothyroidism and adrenal insufficiency. -Continue cabergoline 0.5 mg weekly, Synthroid 150 mcg daily and Hydrocortisone 40 mg in the morning  and 20 mg in the evening, as well as testosterone injection every 3 weeks.  Mobility: Pending PT eval Code Status:   Code Status: Full Code full code Nutritional status: Body mass index is 29.75 kg/m.      Diet Order            Diet Heart Room service appropriate? Yes; Fluid consistency: Thin  Diet effective now                 DVT prophylaxis: apixaban (ELIQUIS) tablet 2.5 mg Start: 01/22/21 2359 apixaban (ELIQUIS) tablet 2.5 mg   Antimicrobials:  None Fluid: LR@100  mL/h Consultants: None Family Communication:  Wife at bedside  Status is: Inpatient  Remains inpatient appropriate because: Pending PT eval, continue IV fluid  Dispo: The patient is from: Home              Anticipated d/c is to: Pending PT eval              Patient currently is not medically stable to d/c.   Difficult to place patient No     Infusions:  . lactated ringers 100 mL/hr at 01/23/21 0720    Scheduled Meds: . amiodarone  200 mg Oral Daily  . apixaban  2.5 mg Oral BID  . aspirin EC  81 mg Oral Daily  . buPROPion  150 mg Oral Daily  . [START ON 01/25/2021] cabergoline  0.5 mg Oral Q Wed  . Evolocumab  1 mL Subcutaneous Q14 Days  . ezetimibe  10 mg Oral Daily  . hydrocortisone  20-40 mg Oral See admin instructions  . levothyroxine  150 mcg Oral QHS  . mometasone-formoterol  2 puff Inhalation BID    Antimicrobials: Anti-infectives (From admission, onward)   None      PRN meds: acetaminophen **OR** acetaminophen, albuterol, methocarbamol, ondansetron **OR** ondansetron (ZOFRAN) IV, oxyCODONE, senna-docusate   Objective: Vitals:   01/23/21 0929 01/23/21 0936  BP:  (!) 150/99  Pulse:  63  Resp:    Temp:  97.8 F (36.6 C)  SpO2: 96% 97%    Intake/Output Summary (Last 24 hours) at 01/23/2021 1333 Last data filed at 01/23/2021 1011 Gross per 24 hour  Intake 1144.69 ml  Output 650 ml  Net 494.69 ml   Filed Weights   01/22/21 1725  Weight: 108 kg   Weight change:  Body mass index is 29.75 kg/m.   Physical Exam: General exam: Pleasant elderly Caucasian male.  Not in physical distress Skin: No rashes, lesions or ulcers. HEENT: Atraumatic, normocephalic, no obvious  bleeding Lungs: Clear to auscultation bilaterally CVS: Bradycardic, no murmur GI/Abd soft, nontender, nondistended, bowel sound present CNS: Alert, awake, oriented to place and person Psychiatry: Mood appropriate Extremities: No pedal edema, no calf tenderness  Data Review: I have personally reviewed the laboratory data and studies available.  Recent Labs  Lab 01/22/21 1747  WBC 8.9  NEUTROABS 7.2  HGB 13.7  HCT 42.3  MCV 83.9  PLT 314   Recent Labs  Lab 01/22/21 1747 01/23/21 0703  NA 138 139  K 4.6 3.6  CL 103 106  CO2 24 26  GLUCOSE 102* 74  BUN 29* 24*  CREATININE 2.14* 1.94*  CALCIUM 8.9 8.7*    F/u labs ordered Unresulted Labs (From admission, onward)          Start     Ordered   01/22/21 1738  Urine Culture  ONCE - STAT,   STAT  01/22/21 1737   Unscheduled  Basic metabolic panel  Daily,   R      01/23/21 1333   Unscheduled  CBC with Differential/Platelet  Daily,   R      01/23/21 1333   Unscheduled  Magnesium  Tomorrow morning,   STAT        01/23/21 1333   Unscheduled  Phosphorus  Tomorrow morning,   R        01/23/21 1333          Signed, Terrilee Croak, MD [facility] 01/23/2021

## 2021-01-24 ENCOUNTER — Other Ambulatory Visit (HOSPITAL_COMMUNITY): Payer: Medicare Other

## 2021-01-24 LAB — CBC WITH DIFFERENTIAL/PLATELET
Abs Immature Granulocytes: 0.01 10*3/uL (ref 0.00–0.07)
Basophils Absolute: 0.1 10*3/uL (ref 0.0–0.1)
Basophils Relative: 1 %
Eosinophils Absolute: 0.1 10*3/uL (ref 0.0–0.5)
Eosinophils Relative: 2 %
HCT: 40.5 % (ref 39.0–52.0)
Hemoglobin: 13.2 g/dL (ref 13.0–17.0)
Immature Granulocytes: 0 %
Lymphocytes Relative: 13 %
Lymphs Abs: 1 10*3/uL (ref 0.7–4.0)
MCH: 27.1 pg (ref 26.0–34.0)
MCHC: 32.6 g/dL (ref 30.0–36.0)
MCV: 83.2 fL (ref 80.0–100.0)
Monocytes Absolute: 0.4 10*3/uL (ref 0.1–1.0)
Monocytes Relative: 5 %
Neutro Abs: 6 10*3/uL (ref 1.7–7.7)
Neutrophils Relative %: 79 %
Platelets: 305 10*3/uL (ref 150–400)
RBC: 4.87 MIL/uL (ref 4.22–5.81)
RDW: 20.1 % — ABNORMAL HIGH (ref 11.5–15.5)
WBC: 7.5 10*3/uL (ref 4.0–10.5)
nRBC: 0 % (ref 0.0–0.2)

## 2021-01-24 LAB — BASIC METABOLIC PANEL
Anion gap: 10 (ref 5–15)
BUN: 18 mg/dL (ref 8–23)
CO2: 24 mmol/L (ref 22–32)
Calcium: 9.1 mg/dL (ref 8.9–10.3)
Chloride: 102 mmol/L (ref 98–111)
Creatinine, Ser: 1.51 mg/dL — ABNORMAL HIGH (ref 0.61–1.24)
GFR, Estimated: 45 mL/min — ABNORMAL LOW (ref >60)
Glucose, Bld: 104 mg/dL — ABNORMAL HIGH (ref 70–99)
Potassium: 4.2 mmol/L (ref 3.5–5.1)
Sodium: 136 mmol/L (ref 135–145)

## 2021-01-24 LAB — URINE CULTURE: Culture: NO GROWTH

## 2021-01-24 LAB — MAGNESIUM: Magnesium: 2.1 mg/dL (ref 1.7–2.4)

## 2021-01-24 LAB — PHOSPHORUS: Phosphorus: 2.7 mg/dL (ref 2.5–4.6)

## 2021-01-24 MED ORDER — HYDROCODONE-ACETAMINOPHEN 5-325 MG PO TABS
1.0000 | ORAL_TABLET | Freq: Four times a day (QID) | ORAL | Status: DC | PRN
  Administered 2021-01-24 – 2021-01-26 (×6): 1 via ORAL
  Filled 2021-01-24 (×6): qty 1

## 2021-01-24 MED ORDER — LIDOCAINE HCL URETHRAL/MUCOSAL 2 % EX GEL
1.0000 "application " | Freq: Once | CUTANEOUS | Status: AC
  Administered 2021-01-24: 1 via URETHRAL
  Filled 2021-01-24: qty 11

## 2021-01-24 NOTE — Progress Notes (Addendum)
PROGRESS NOTE  [name], [name]  DOB: Oct 12, 1933  PCP: [name], MD DJM:[id number]  DOA: 01/22/2021  LOS: 2 days   Chief Complaint  Patient presents with  . Fall   Brief narrative: [name], [name] is a 87 y.o. male with PMH significant for pituitary adenoma s/p resection, with resultant adrenal insufficiency, hypothyroidism; HTN, CAD with stents, nonischemic cardiomyopathy and chronic atrial bigeminy.  Brought to the ED on 4/24 with complaint of progressively increasing weakness for last few weeks, poor oral intake, orthostatic dizziness, leading to a fall, hit his head but did not have loss of consciousness. Per family, patient has a significant functional decline over the last year.  He does not get around as he used to.  Has mild cognitive impairment. Per previous chart, 11/30/2020, patient had nausea, diaphoresis, lightheadedness and briefly passed out.  He was seen in the ER and found to be bradycardic in the 40s.  Coreg and nebivolol were stopped.  Zio patch was placed for 2 weeks which showed 2 runs of V. Tach. Patient follow-up with cardiologist Dr. Aundra Dubin on 3/21.  Patient was started on amiodarone. Patient continued to have progressive weakness, he also had a fall and is brought to the ED  In the ED, patient had low heart rate in the 50s, has chronic bradycardia, hemodynamically stable. Labs noted AKI with a creatinine of 2.14, baseline 1.2 Admitted to hospital service for further evaluation management  Subjective: Patient was seen and examined this morning. Pleasant, elderly Caucasian male.   Not in physical distress. Mental status, slightly impaired, remains at baseline.   Family not at bedside. Spoke to him over the phone.  Assessment/Plan: Fall, progressive generalized weakness -I think, patient was having bradycardia related to meds (amiodarone, Namenda, galantamine) causing low blood pressure, lethargy, orthostatic dizziness, impaired kidney function and falls. -PT  eval was obtained.  SNF recommended.  AKI on CKD 3b -Baseline creatinine less than 1.8 over the years. -Presented with creatinine elevated to 2.14, primarily due to poor oral intake, use of diuretics and bradycardia -Given adequate IV hydration.  Creatinine gradually improving and is close to baseline.  Encourage oral hydration. Recent Labs    10/25/20 1435 11/30/20 1109 12/19/20 1208 01/22/21 1747 01/23/21 0703 01/24/21 0249  BUN 18 24* 19 29* 24* 18  CREATININE 1.48* 1.73* 1.26* 2.14* 1.94* 1.51*   Chronic bradycardia History of atrial bigeminy and nonsustained V. tach -Patient has history of atrial bigeminy and nonsustained V. tach.  He used to be on Coreg and nebivolol. They were stopped on 11/30/2020 when he was found to be bradycardic in one of the ED visits.   -3/21, he was started on amiodarone by cardiologist Dr. Aundra Dubin.   -4/25, I discussed the case with cardiologist Dr. Aundra Dubin.  With the concern of persistent bradycardia, he recommended to reduce amiodarone to 100 mg daily.   -Continue to monitor in telemetry.    CAD s/p stents Hyperlipidemia/statin intolerance -Aspirin, Ranexa, Repatha, Zetia  Cardiomyopathy Chronic diastolic CHF -Echo on 2/22 with EF 50 to 55% which is better than previous echo, grade 1 diastolic dysfunction -Cardiolite with a EF 49% no ischemia. -He was on torsemide 10 mg every other day at home.  Currently on hold because of dehydration.  History of DVT/PE -He had posttraumatic DVT in 11/19.  He was on Eliquis 2.5 milligrams twice daily for long-term use.  With the new concern of weakness and falls, I think it would be more reasonable to stop Eliquis.  Patient is also on aspirin 81 mg daily which we will continue.  Chronic progressive dyspnea -Seen by cardiology and pulmonary in the past.  No clear etiology.  Probably due to progressive deconditioning  Chronic bilateral low back pain without sciatica -Continue Norco as needed for pain as needed.   -Added Robaxin   Mild dementia -At home, patient was on Namenda, Galantamine and Wellbutrin. -I think patient was getting cholinergic side effects from Namenda and galantamine causing bradycardia.  I discussed with family.  We will stop these medications.  Okay to continue Wellbutrin.  History of pituitary adenoma s/p resection with resultant hypothyroidism and adrenal insufficiency. -Continue cabergoline 0.5 mg weekly, Synthroid 150 mcg daily and Hydrocortisone 40 mg in the morning and 20 mg in the evening, as well as testosterone injection every 3 weeks.  Mobility: PT eval obtained.  SNF recommended. Code Status:   Code Status: Full Code full code Nutritional status: Body mass index is 29.75 kg/m.     Diet Order            Diet Heart Room service appropriate? Yes; Fluid consistency: Thin  Diet effective now                 DVT prophylaxis:    Antimicrobials:  None Fluid: Okay to stop IV fluid Consultants: None Family Communication:  Discussed with wife and daughter at bedside  Status is: Inpatient  Remains inpatient appropriate because: PT eval obtained.  Dispo: The patient is from: Home              Anticipated d/c is to: SNF              Patient currently is medically stable to d/c.   Difficult to place patient No     Infusions:    Scheduled Meds: . amiodarone  100 mg Oral Daily  . aspirin EC  81 mg Oral Daily  . buPROPion  150 mg Oral Daily  . [START ON 01/25/2021] cabergoline  0.5 mg Oral Q Wed  . Evolocumab  1 mL Subcutaneous Q14 Days  . ezetimibe  10 mg Oral Daily  . hydrocortisone  20 mg Oral QHS  . hydrocortisone  40 mg Oral Daily  . levothyroxine  150 mcg Oral QHS  . mometasone-formoterol  2 puff Inhalation BID    Antimicrobials: Anti-infectives (From admission, onward)   None      PRN meds: acetaminophen **OR** acetaminophen, albuterol, methocarbamol, ondansetron **OR** ondansetron (ZOFRAN) IV, senna-docusate   Objective: Vitals:    01/24/21 0348 01/24/21 0747  BP: (!) 151/92   Pulse: (!) 55 (!) 51  Resp: 18 18  Temp: 98.1 F (36.7 C)   SpO2: 98% 98%    Intake/Output Summary (Last 24 hours) at 01/24/2021 1052 Last data filed at 01/24/2021 0954 Gross per 24 hour  Intake 2846.01 ml  Output 1025 ml  Net 1821.01 ml   Filed Weights   01/22/21 1725  Weight: 108 kg   Weight change:  Body mass index is 29.75 kg/m.   Physical Exam: General exam: Pleasant elderly Caucasian male.  Not in physical distress Skin: No rashes, lesions or ulcers. HEENT: Atraumatic, normocephalic, no obvious bleeding Lungs: Clear to auscultation bilaterally CVS: Bradycardic, no murmur GI/Abd soft, nontender, nondistended, bowel sound present CNS: Alert, awake, oriented to place and person Psychiatry: Mood appropriate Extremities: No pedal edema, no calf tenderness  Data Review: I have personally reviewed the laboratory data and studies available.  Recent Labs  Lab 01/22/21 1747  01/24/21 0249  WBC 8.9 7.5  NEUTROABS 7.2 6.0  HGB 13.7 13.2  HCT 42.3 40.5  MCV 83.9 83.2  PLT 314 305   Recent Labs  Lab 01/22/21 1747 01/23/21 0703 01/24/21 0249  NA 138 139 136  K 4.6 3.6 4.2  CL 103 106 102  CO2 24 26 24   GLUCOSE 102* 74 104*  BUN 29* 24* 18  CREATININE 2.14* 1.94* 1.51*  CALCIUM 8.9 8.7* 9.1  MG  --   --  2.1  PHOS  --   --  2.7    F/u labs ordered Unresulted Labs (From admission, onward)          Start     Ordered   01/24/21 0981  Basic metabolic panel  Daily,   R      01/23/21 1333   01/24/21 0500  CBC with Differential/Platelet  Daily,   R      01/23/21 1333          Signed, Terrilee Croak, MD [facility] 01/24/2021

## 2021-01-24 NOTE — Progress Notes (Signed)
Lidocaine administered as ordered. Coude Foley catheter inserted as per orders. 1000 mL of clear, yellow urine obtained immediately. Patient stated that he is beginning to feel much better with less pain. Will continue to monitor.

## 2021-01-24 NOTE — NC FL2 (Signed)
[place] LEVEL OF CARE SCREENING TOOL     IDENTIFICATION  Patient Name: [name], [name] Birthdate: 12/19/1933 Sex: male Admission Date (Current Location): 01/22/2021  [facility] and [place] Number:  Herbalist and Address:  [facility] [place], [place] [address], [place], [place] [address]      Provider Number: [id number]  Attending Physician Name and Address:  Terrilee Croak, MD  Relative Name and Phone Number:       Current Level of Care: Hospital Recommended Level of Care: [place] Prior Approval Number:    Date Approved/Denied:   PASRR Number: [id number]  Discharge Plan: SNF    Current Diagnoses: Patient Active Problem List   Diagnosis Date Noted  . Bradycardia 01/22/2021  . Generalized weakness 01/22/2021  . Fall at home, initial encounter 01/22/2021  . Skin tear of right hand without complication 13/24/4010  . Left leg cellulitis 10/13/2019  . Cellulitis 10/13/2019  . Other specified disorders of nose and nasal sinuses 09/04/2018  . Sleep disturbance 09/04/2018  . DVT (deep venous thrombosis) ([place]) 08/22/2018  . Pulmonary embolism ([place]) 08/22/2018  . CKD (chronic kidney disease) stage 3, GFR 30-59 ml/min 08/22/2018  . Anemia 08/22/2018  . BPH (benign prostatic hyperplasia) 08/22/2018  . Anxiety 08/22/2018  . HTN (hypertension) 08/22/2018  . HLD (hyperlipidemia) 08/22/2018  . Atopic asthma 08/22/2018  . VTE (venous thromboembolism) 08/21/2018  . Positive radioallergosorbent test (RAST) 06/30/2018  . Elevated IgE level 06/30/2018  . Hiatal hernia 06/27/2018  . Atypical angina ([place]) 08/08/2017  . RBBB, anterior fascicular block and incompl posterior fascicular block 08/08/2017  . Urinary retention due to benign prostatic hyperplasia 12/05/2016  . Overactive bladder 12/05/2016  . Acute adrenal insufficiency ([place]) 12/03/2016  . AKI (acute kidney injury) ([place])   . Right bundle branch block   . Orthostatic  hypotension   . CAD (coronary artery disease)   . History of ST elevation myocardial infarction (STEMI)   . H/O: pituitary tumor   . Chronic bilateral low back pain without sciatica   . Hyponatremia   . Leukocytosis   . Falls 12/02/2016  . Diarrhea 12/02/2016  . Syncope 12/02/2016  . Nasal fracture 12/02/2016  . Abnormal stress test 07/29/2015  . Angina effort   . Abnormal nuclear stress test   . NSTEMI (non-ST elevated myocardial infarction) ([place]) 08/25/2014  . Basal cell carcinoma 07/15/2014  . Open wound of nose with complication 27/25/3664  . Disease of upper respiratory system 07/15/2014  . Chronic infection of sinus 07/15/2014  . Dyssomnia 07/15/2014  . Dermatologic disease 04/05/2014  . CA of skin 04/05/2014  . Pituitary macroadenoma with extrasellar extension ([place]) 01/13/2014  . 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 Pituitary adenoma ([place]) 01/13/2014  . Mild cognitive disorder 01/13/2014  . PAC (premature atrial contraction) 12/10/2013  . APC (atrial premature contractions) 12/10/2013  . Lesion of pituitary gland ([place]) 08/30/2011  . HYPERCHOLESTEROLEMIA  IIA 02/10/2009  . CAD, NATIVE VESSEL 02/10/2009  . Cardiomyopathy ([place]) 02/10/2009  . BRADYCARDIA 02/10/2009  . Cardiac conduction disorder 02/10/2009  . CAD in native artery 02/10/2009    Orientation RESPIRATION BLADDER Height & Weight     Self,Time,Place  Normal Incontinent Weight: 238 lb (108 kg) Height:  6\' 3"  (190.5 cm)  BEHAVIORAL SYMPTOMS/MOOD NEUROLOGICAL BOWEL NUTRITION STATUS      Incontinent Diet (See dc summary)  AMBULATORY STATUS COMMUNICATION OF NEEDS Skin   Limited Assist Verbally Normal  Personal Care Assistance Level of Assistance  Bathing,Feeding,Dressing Bathing Assistance: Limited assistance Feeding assistance: Independent Dressing Assistance: Limited assistance     Functional Limitations Info  Sight,Speech,Hearing Sight  Info: Adequate Hearing Info: Adequate Speech Info: Adequate    SPECIAL CARE FACTORS FREQUENCY  PT (By licensed PT),OT (By licensed OT)     PT Frequency: 5x a week OT Frequency: 5x a week            Contractures      Additional Factors Info  Code Status,Allergies Code Status Info: Full Allergies Info: Sulfa Antibiotics   Sulfamethoxazole   Sulfonamide Derivatives   Bee Venom   Flonase (Fluticasone)   Tamsulosin           Current Medications (01/24/2021):  This is the current hospital active medication list Current Facility-Administered Medications  Medication Dose Route Frequency Provider Last Rate Last Admin  . acetaminophen (TYLENOL) tablet 650 mg  650 mg Oral Q6H PRN Chotiner, Yevonne Aline, MD   650 mg at 01/24/21 0544   Or  . acetaminophen (TYLENOL) suppository 650 mg  650 mg Rectal Q6H PRN Chotiner, Yevonne Aline, MD      . albuterol (VENTOLIN HFA) 108 (90 Base) MCG/ACT inhaler 2 puff  2 puff Inhalation Q4H PRN Chotiner, Yevonne Aline, MD      . amiodarone (PACERONE) tablet 100 mg  100 mg Oral Daily Dahal, Binaya, MD   100 mg at 01/24/21 0900  . aspirin EC tablet 81 mg  81 mg Oral Daily Dahal, Binaya, MD   81 mg at 01/24/21 0900  . buPROPion (WELLBUTRIN XL) 24 hr tablet 150 mg  150 mg Oral Daily Dahal, Binaya, MD   150 mg at 01/24/21 0900  . [START ON 01/25/2021] cabergoline (DOSTINEX) tablet 0.5 mg  0.5 mg Oral Q Wed Dahal, Binaya, MD      . Evolocumab SOSY 1 mL  1 mL Subcutaneous Q14 Days Dahal, Binaya, MD      . ezetimibe (ZETIA) tablet 10 mg  10 mg Oral Daily Chotiner, Yevonne Aline, MD   10 mg at 01/24/21 0900  . HYDROcodone-acetaminophen (NORCO/VICODIN) 5-325 MG per tablet 1 tablet  1 tablet Oral Q6H PRN Dahal, Marlowe Aschoff, MD   1 tablet at 01/24/21 1114  . hydrocortisone (CORTEF) tablet 20 mg  20 mg Oral QHS Dahal, Marlowe Aschoff, MD   20 mg at 01/23/21 2036  . hydrocortisone (CORTEF) tablet 40 mg  40 mg Oral Daily Dahal, Binaya, MD   40 mg at 01/24/21 0900  . levothyroxine (SYNTHROID) tablet  150 mcg  150 mcg Oral QHS Chotiner, Yevonne Aline, MD   150 mcg at 01/23/21 2036  . methocarbamol (ROBAXIN) tablet 500 mg  500 mg Oral Q8H PRN Terrilee Croak, MD   500 mg at 01/24/21 0544  . mometasone-formoterol (DULERA) 200-5 MCG/ACT inhaler 2 puff  2 puff Inhalation BID Chotiner, Yevonne Aline, MD   2 puff at 01/24/21 0747  . ondansetron (ZOFRAN) tablet 4 mg  4 mg Oral Q6H PRN Chotiner, Yevonne Aline, MD       Or  . ondansetron (ZOFRAN) injection 4 mg  4 mg Intravenous Q6H PRN Chotiner, Yevonne Aline, MD      . senna-docusate (Senokot-S) tablet 1 tablet  1 tablet Oral QHS PRN Chotiner, Yevonne Aline, MD       Facility-Administered Medications Ordered in Other Encounters  Medication Dose Route Frequency Provider Last Rate Last Admin  . sodium chloride flush (NS) 0.9 % injection 3 mL  3 mL Intravenous  Q12H [name], MD         Discharge Medications: Please see discharge summary for a list of discharge medications.  Relevant Imaging Results:  Relevant Lab Results:   Additional Information SSN [id number]  Emeterio Reeve, [place]

## 2021-01-24 NOTE — TOC Initial Note (Signed)
Transition of Care [facility]) - Initial/Assessment Note    Patient Details  Name: [name], [name] MRN: [id number] Date of Birth: [birth year]  Transition of Care [facility]) CM/SW Contact:    Emeterio Reeve, [place] Phone Number: 01/24/2021, 2:54 PM  Clinical Narrative:                 CSW spoke to pts wife on phone. Pts wife [name] stated that PTA pt was independent at home with mobility and ADL's. 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 Wife reports a decline about 3 days before coming into the hospital. Wife states that pt has one covid 19 vaccine, pt had an reaction and his PCP told him to not get another  Vaccine and pt has a letter stating such.   CSW reviewed PT/OT reccs of SNF. Pts wife is open to SNF. PT has not been to SNF in the past. CSW gave pts wife medicare.gov list to review.   CSW will follow.  Expected Discharge Plan: Skilled Nursing Facility Barriers to Discharge: Continued Medical Work up   Patient Goals and CMS Choice Patient states their goals for this hospitalization and ongoing recovery are:: to build strength CMS Medicare.gov Compare Post Acute Care list provided to:: Patient Choice offered to / list presented to : Spouse  Expected Discharge Plan and Services Expected Discharge Plan: [place] Acute Care Choice: Durable Medical Equipment Living arrangements for the past 2 months: Single Family Home                                      Prior Living Arrangements/Services Living arrangements for the past 2 months: Single Family Home Lives with:: Spouse Patient language and need for interpreter reviewed:: Yes Do you feel safe going back to the place where you live?: Yes      Need for Family Participation in Patient Care: Yes (Comment) Care giver support system in place?: Yes (comment)   Criminal Activity/Legal Involvement Pertinent to Current Situation/Hospitalization: No - Comment as needed  Activities of  Daily Living      Permission Sought/Granted Permission sought to share information with : Facility Retail banker granted to share information with : Yes, Verbal Permission Granted     Permission granted to share info w AGENCY: SNF        Emotional Assessment Appearance:: Appears stated age Attitude/Demeanor/Rapport: Engaged Affect (typically observed): Appropriate Orientation: : Oriented to Place,Oriented to Self,Oriented to  Time Alcohol / Substance Use: Not Applicable Psych Involvement: No (comment)  Admission diagnosis:  AKI (acute kidney injury) ([place]) [N17.9] Patient Active Problem List   Diagnosis Date Noted  . Bradycardia 01/22/2021  . Generalized weakness 01/22/2021  . Fall at home, initial encounter 01/22/2021  . Skin tear of right hand without complication 40/98/1191  . Left leg cellulitis 10/13/2019  . Cellulitis 10/13/2019  . Other specified disorders of nose and nasal sinuses 09/04/2018  . Sleep disturbance 09/04/2018  . DVT (deep venous thrombosis) ([place]) 08/22/2018  . Pulmonary embolism ([place]) 08/22/2018  . CKD (chronic kidney disease) stage 3, GFR 30-59 ml/min 08/22/2018  . Anemia 08/22/2018  . BPH (benign prostatic hyperplasia) 08/22/2018  . Anxiety 08/22/2018  . HTN (hypertension) 08/22/2018  . HLD (hyperlipidemia) 08/22/2018  . Atopic asthma 08/22/2018  . VTE (venous thromboembolism) 08/21/2018  . Positive radioallergosorbent test (RAST) 06/30/2018  .  Elevated IgE level 06/30/2018  . Hiatal hernia 06/27/2018  . Atypical angina ([place]) 08/08/2017  . RBBB, anterior fascicular block and incompl posterior fascicular block 08/08/2017  . Urinary retention due to benign prostatic hyperplasia 12/05/2016  . Overactive bladder 12/05/2016  . Acute adrenal insufficiency ([place]) 12/03/2016  . AKI (acute kidney injury) ([place])   . Right bundle branch block   . Orthostatic hypotension   . CAD (coronary artery disease)   . History of  ST elevation myocardial infarction (STEMI)   . H/O: pituitary tumor   . Chronic bilateral low back pain without sciatica   . Hyponatremia   . Leukocytosis   . Falls 12/02/2016  . Diarrhea 12/02/2016  . Syncope 12/02/2016  . Nasal fracture 12/02/2016  . Abnormal stress test 07/29/2015  . Angina effort   . Abnormal nuclear stress test   . NSTEMI (non-ST elevated myocardial infarction) ([place]) 08/25/2014  . Basal cell carcinoma 07/15/2014  . Open wound of nose with complication 35/36/1443  . Disease of upper respiratory system 07/15/2014  . Chronic infection of sinus 07/15/2014  . Dyssomnia 07/15/2014  . Dermatologic disease 04/05/2014  . CA of skin 04/05/2014  . Pituitary macroadenoma with extrasellar extension ([place]) 01/13/2014  . 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 Pituitary adenoma ([place]) 01/13/2014  . Mild cognitive disorder 01/13/2014  . PAC (premature atrial contraction) 12/10/2013  . APC (atrial premature contractions) 12/10/2013  . Lesion of pituitary gland ([place]) 08/30/2011  . HYPERCHOLESTEROLEMIA  IIA 02/10/2009  . CAD, NATIVE VESSEL 02/10/2009  . Cardiomyopathy ([place]) 02/10/2009  . BRADYCARDIA 02/10/2009  . Cardiac conduction disorder 02/10/2009  . CAD in native artery 02/10/2009   PCP:  [name], MD Pharmacy:   [facility] [address], [place] - [place] AT [place] [address] [place] [address] Phone: [phone] Fax: [phone]  [id number] [place], [place] - [place] AT [place] [address] [place] [address] Phone: [phone] Fax: [phone]  [facility] [place], [place] [address] [place] [address] Phone: [phone] Fax: [phone]     Social Determinants of Health (SDOH) Interventions     Readmission Risk Interventions No flowsheet data found.  Emeterio Reeve, Latanya Presser, [place] Social Worker [phone]

## 2021-01-24 NOTE — Progress Notes (Signed)
Spoke with Dr. Pietro Cassis to see if pt could have a Coude catheter inserted with the urojet urethral lidocaine to help with the pain of insertion.  Pt currently has 500 cc in his bladder per bladder scanner. Dr. Pietro Cassis agrees and ordered this.

## 2021-01-25 LAB — BASIC METABOLIC PANEL
Anion gap: 12 (ref 5–15)
BUN: 12 mg/dL (ref 8–23)
CO2: 24 mmol/L (ref 22–32)
Calcium: 9.4 mg/dL (ref 8.9–10.3)
Chloride: 102 mmol/L (ref 98–111)
Creatinine, Ser: 1.28 mg/dL — ABNORMAL HIGH (ref 0.61–1.24)
GFR, Estimated: 55 mL/min — ABNORMAL LOW (ref >60)
Glucose, Bld: 90 mg/dL (ref 70–99)
Potassium: 3.8 mmol/L (ref 3.5–5.1)
Sodium: 138 mmol/L (ref 135–145)

## 2021-01-25 LAB — CBC WITH DIFFERENTIAL/PLATELET
Abs Immature Granulocytes: 0.05 10*3/uL (ref 0.00–0.07)
Basophils Absolute: 0.1 10*3/uL (ref 0.0–0.1)
Basophils Relative: 1 %
Eosinophils Absolute: 0.2 10*3/uL (ref 0.0–0.5)
Eosinophils Relative: 2 %
HCT: 41.4 % (ref 39.0–52.0)
Hemoglobin: 13.4 g/dL (ref 13.0–17.0)
Immature Granulocytes: 1 %
Lymphocytes Relative: 13 %
Lymphs Abs: 1.3 10*3/uL (ref 0.7–4.0)
MCH: 27 pg (ref 26.0–34.0)
MCHC: 32.4 g/dL (ref 30.0–36.0)
MCV: 83.3 fL (ref 80.0–100.0)
Monocytes Absolute: 0.6 10*3/uL (ref 0.1–1.0)
Monocytes Relative: 6 %
Neutro Abs: 7.7 10*3/uL (ref 1.7–7.7)
Neutrophils Relative %: 77 %
Platelets: 298 10*3/uL (ref 150–400)
RBC: 4.97 MIL/uL (ref 4.22–5.81)
RDW: 19.9 % — ABNORMAL HIGH (ref 11.5–15.5)
WBC: 10 10*3/uL (ref 4.0–10.5)
nRBC: 0 % (ref 0.0–0.2)

## 2021-01-25 LAB — SARS CORONAVIRUS 2 (TAT 6-24 HRS): SARS Coronavirus 2: NEGATIVE

## 2021-01-25 MED ORDER — CHLORHEXIDINE GLUCONATE CLOTH 2 % EX PADS
6.0000 | MEDICATED_PAD | Freq: Every day | CUTANEOUS | Status: DC
  Administered 2021-01-25 – 2021-01-26 (×2): 6 via TOPICAL

## 2021-01-25 MED ORDER — QUETIAPINE FUMARATE 50 MG PO TABS
25.0000 mg | ORAL_TABLET | Freq: Every day | ORAL | Status: DC
  Administered 2021-01-25: 25 mg via ORAL
  Filled 2021-01-25: qty 1

## 2021-01-25 NOTE — Progress Notes (Signed)
PROGRESS NOTE  [name], [name]  DOB: 1934-06-09  PCP: [name], MD KGU:[id number]  DOA: 01/22/2021  LOS: 3 days   Chief Complaint  Patient presents with  . Fall   Brief narrative: [name], [name] is a 87 y.o. male with PMH significant for pituitary adenoma s/p resection, with resultant adrenal insufficiency, hypothyroidism; HTN, CAD with stents, nonischemic cardiomyopathy and chronic atrial bigeminy.  Brought to the ED on 4/24 with complaint of progressively increasing weakness for last few weeks, poor oral intake, orthostatic dizziness, leading to a fall, hit his head but did not have loss of consciousness. Per family, patient has a significant functional decline over the last year.  He does not get around as he used to.  Has mild cognitive impairment. Per previous chart, 11/30/2020, patient had nausea, diaphoresis, lightheadedness and briefly passed out.  He was seen in the ER and found to be bradycardic in the 40s.  Coreg and nebivolol were stopped.  Zio patch was placed for 2 weeks which showed 2 runs of V. Tach. Patient follow-up with cardiologist Dr. Aundra Dubin on 3/21.  Patient was started on amiodarone. Patient continued to have progressive weakness, he also had a fall and is brought to the ED  In the ED, patient had low heart rate in the 50s, has chronic bradycardia, hemodynamically stable. Labs noted AKI with a creatinine of 2.14, baseline 1.2 Admitted to hospital service for further evaluation management  Subjective: Patient was seen and examined this morning.  Got in bed.  Not in distress.  Late yesterday afternoon, patient had pain due to urinary retention.  He required coud catheter placement.  Assessment/Plan: Fall, progressive generalized weakness -I think due to bradycardia related to meds (amiodarone, Namenda, galantamine), patient was having low blood pressure, lethargy, orthostatic dizziness, impaired kidney function and falls. -PT eval was obtained.  SNF  recommended.  AKI on CKD 3b -Baseline creatinine less than 1.8 over the years. -Presented with creatinine elevated to 2.14, primarily due to poor oral intake, use of diuretics and bradycardia -Given adequate IV hydration.  Creatinine gradually improving and is close to baseline.  IV fluid is stopped.  Encourage oral hydration. Recent Labs    10/25/20 1435 11/30/20 1109 12/19/20 1208 01/22/21 1747 01/23/21 0703 01/24/21 0249 01/25/21 0224  BUN 18 24* 19 29* 24* 18 12  CREATININE 1.48* 1.73* 1.26* 2.14* 1.94* 1.51* 1.28*   Chronic bradycardia History of atrial bigeminy and nonsustained V. tach -Patient has history of atrial bigeminy and nonsustained V. tach.  He used to be on Coreg and nebivolol. They were stopped on 11/30/2020 when he was found to be bradycardic in one of the ED visits.   -3/21, he was started on amiodarone by cardiologist Dr. Aundra Dubin.   -4/25, I discussed the case with cardiologist Dr. Aundra Dubin.  With the concern of persistent bradycardia, he recommended to reduce amiodarone to 100 mg daily.  Heart rate remains in 50s. -Continue to monitor in telemetry.    CAD s/p stents Hyperlipidemia/statin intolerance -Aspirin, Ranexa, Repatha, Zetia  Cardiomyopathy Chronic diastolic CHF -Echo on 6/28 with EF 50 to 55% which is better than previous echo, grade 1 diastolic dysfunction -Cardiolite with a EF 49% no ischemia. -He was on torsemide 10 mg every other day at home.  Currently on hold because of dehydration.  History of DVT/PE -He had posttraumatic DVT in 11/19.  He was on Eliquis 2.5 milligrams twice daily for long-term use.  With the new concern of weakness and falls,  I think it would be more reasonable to stop Eliquis.  Patient is also on aspirin 81 mg daily which we will continue.  Chronic progressive dyspnea -Seen by cardiology and pulmonary in the past.  No clear etiology.  Probably due to progressive deconditioning  Chronic bilateral low back pain without  sciatica -Continue Norco as needed for pain as needed.  -Added Robaxin   Mild dementia -At home, patient was on Namenda, Galantamine and Wellbutrin. -I think patient was getting cholinergic side effects from Namenda and galantamine causing bradycardia.  I discussed with family.  We will stop these medications.  Okay to continue Wellbutrin. -Add Seroquel 25 mg nightly to improve circadian rhythm.  History of pituitary adenoma s/p resection with resultant hypothyroidism and adrenal insufficiency. -Continue cabergoline 0.5 mg weekly, Synthroid 150 mcg daily and Hydrocortisone 40 mg in the morning and 20 mg in the evening, as well as testosterone injection every 3 weeks.  Mobility: PT eval obtained.  SNF recommended. Code Status:   Code Status: Full Code full code Nutritional status: Body mass index is 29.75 kg/m.     Diet Order            Diet Heart Room service appropriate? Yes; Fluid consistency: Thin  Diet effective now                 DVT prophylaxis:    Antimicrobials:  None Fluid: Not on IV fluid Consultants: None Family Communication:  None at bedside  Status is: Inpatient  Remains inpatient appropriate because: PT eval obtained.  SNF recommended  Dispo: The patient is from: Home              Anticipated d/c is to: SNF              Patient currently is medically stable to d/c.   Difficult to place patient No     Infusions:    Scheduled Meds: . amiodarone  100 mg Oral Daily  . aspirin EC  81 mg Oral Daily  . buPROPion  150 mg Oral Daily  . cabergoline  0.5 mg Oral Q Wed  . Chlorhexidine Gluconate Cloth  6 each Topical Daily  . Evolocumab  1 mL Subcutaneous Q14 Days  . ezetimibe  10 mg Oral Daily  . hydrocortisone  20 mg Oral QHS  . hydrocortisone  40 mg Oral Daily  . levothyroxine  150 mcg Oral QHS  . mometasone-formoterol  2 puff Inhalation BID    Antimicrobials: Anti-infectives (From admission, onward)   None      PRN meds: acetaminophen  **OR** acetaminophen, albuterol, HYDROcodone-acetaminophen, methocarbamol, ondansetron **OR** ondansetron (ZOFRAN) IV, senna-docusate   Objective: Vitals:   01/25/21 0434 01/25/21 0839  BP: 129/66   Pulse: (!) 55   Resp: 17   Temp: 97.9 F (36.6 C)   SpO2: 98% 97%    Intake/Output Summary (Last 24 hours) at 01/25/2021 1103 Last data filed at 01/25/2021 0900 Gross per 24 hour  Intake 820 ml  Output 3875 ml  Net -3055 ml   Filed Weights   01/22/21 1725  Weight: 108 kg   Weight change:  Body mass index is 29.75 kg/m.   Physical Exam: General exam: Pleasant elderly Caucasian male.  Not in physical distress .  Has a Foley cathin place.   skin: No rashes, lesions or ulcers. HEENT: Atraumatic, normocephalic, no obvious bleeding Lungs: Clear to auscultation bilaterally CVS: Bradycardic, no murmur GI/Abd soft, nontender, nondistended, bowel sound present CNS: Alert, awake, oriented to  place and person.  Not restless or agitated. Psychiatry: Mood appropriate Extremities: No pedal edema, no calf tenderness  Data Review: I have personally reviewed the laboratory data and studies available.  Recent Labs  Lab 01/22/21 1747 01/24/21 0249 01/25/21 0224  WBC 8.9 7.5 10.0  NEUTROABS 7.2 6.0 7.7  HGB 13.7 13.2 13.4  HCT 42.3 40.5 41.4  MCV 83.9 83.2 83.3  PLT 314 305 298   Recent Labs  Lab 01/22/21 1747 01/23/21 0703 01/24/21 0249 01/25/21 0224  NA 138 139 136 138  K 4.6 3.6 4.2 3.8  CL 103 106 102 102  CO2 24 26 24 24   GLUCOSE 102* 74 104* 90  BUN 29* 24* 18 12  CREATININE 2.14* 1.94* 1.51* 1.28*  CALCIUM 8.9 8.7* 9.1 9.4  MG  --   --  2.1  --   PHOS  --   --  2.7  --     F/u labs ordered Unresulted Labs (From admission, onward)          Start     Ordered   01/25/21 0944  SARS CORONAVIRUS 2 (TAT 6-24 HRS) Nasopharyngeal Nasopharyngeal Swab  Once,   R       Question Answer Comment  Is this test for diagnosis or screening Screening   Symptomatic for COVID-19  as defined by CDC No   Hospitalized for COVID-19 No   Admitted to ICU for COVID-19 No   Previously tested for COVID-19 Yes   Resident in a congregate (group) care setting No   Employed in healthcare setting No   Has patient completed COVID vaccination(s) (2 doses of Pfizer/Moderna 1 dose of [employer]) Unknown      01/25/21 0943   01/24/21 4259  Basic metabolic panel  Daily,   R      01/23/21 1333   01/24/21 0500  CBC with Differential/Platelet  Daily,   R      01/23/21 1333          Signed, Terrilee Croak, MD [facility] 01/25/2021

## 2021-01-25 NOTE — Care Management Important Message (Signed)
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

## 2021-01-25 NOTE — Progress Notes (Signed)
Physical Therapy Treatment Patient Details Name: DOCTOR SHEAHAN, [name] MRN: [id number] DOB: March 18, 1934 Today's Date: 01/25/2021    History of Present Illness [name], [name] is a 87 y.o. male with medical history significant for MCI, HTN, CAD s/p stents, asthma, BPH who presents after fall at home, and progressive weakness, fatigue leading to inability to walk    PT Comments    Continuing work on functional mobility and activity tolerance;  Session focused on assessment of activity tolerance, specifically orthostatic BPs, and pushing progressive ambulation; Notably better, with incr amb distance, and better rise from recliner with use of armrests; Still with increased fall risk, and tends to push RW too far in front of him, leading to quicker steps to keep up; Mod assist for safety and RW management; Orthostatic BPs indicative of initial SBP drop of more than 14mmHg at initial stand, but his system does adjust to change in posture in time (see below);   Noted plans for dc to SNF, possibly tomorrow; Overall progressing well; Anticipate continuing good progress at post-acute rehabilitation.   Follow Up Recommendations  SNF     Equipment Recommendations  Rolling walker with 5" wheels;3in1 (PT)    Recommendations for Other Services       Precautions / Restrictions Precautions Precautions: Fall    Mobility  Bed Mobility                    Transfers Overall transfer level: Needs assistance Equipment used: Rolling walker (2 wheeled) Transfers: Sit to/from Stand Sit to Stand: Min assist         General transfer comment: Min assist, with good push off of armrests to rise from recliner; Slow rise, and min assist to steady, but did not need anti-gravity assist to power up  Ambulation/Gait Ambulation/Gait assistance: Min assist;Mod assist Gait Distance (Feet): 100 Feet (greater than; with rest breaks) Assistive device: Rolling walker (2 wheeled) Gait Pattern/deviations:  Step-through pattern;Decreased step length - right;Decreased step length - left;Trunk flexed Gait velocity: would quicken when RW too far in front   General Gait Details: Cues to self-monitor for activity tolerance; chair pulled behind for safety; Light mod assist to steady and hold RW closer to pt, as he tended to push it too far in front of him, leading to quicker steps to keep up, and significantly increased fall risk   Stairs             Wheelchair Mobility    Modified Rankin (Stroke Patients Only)       Balance     Sitting balance-Leahy Scale: Fair       Standing balance-Leahy Scale: Poor                              Cognition Arousal/Alertness: Awake/alert Behavior During Therapy: WFL for tasks assessed/performed Overall Cognitive Status: History of cognitive impairments - at baseline Area of Impairment: Orientation                 Orientation Level: Disoriented to;Place;Situation             General Comments: Notably HOH, and asks to repeat statements occasionally, so that must be taken into account; When he saw Glenda with Environmental while walkingin the hallway, he stated, "she was in my office earlier"; Glenda confirmed that she had cleaned his room earlier      Exercises Other Exercises Other Exercises: March in place with RW  x10    General Comments General comments (skin integrity, edema, etc.):   01/25/21 1300  Orthostatic Sitting  BP- Sitting 117/80  Pulse- Sitting 70  Orthostatic Standing at 0 minutes  BP- Standing at 0 minutes 93/72  Pulse- Standing at 0 minutes 77  Orthostatic Standing at 3 minutes  BP- Standing at 3 minutes 109/73  Pulse- Standing at 3 minutes 86    BP 120/80 seated post ambulation      Pertinent Vitals/Pain Pain Assessment: No/denies pain    Home Living                      Prior Function            PT Goals (current goals can now be found in the care plan section) Acute  Rehab PT Goals Patient Stated Goal: Get stronger, better endurance with walking PT Goal Formulation: With patient/family Time For Goal Achievement: 02/06/21 Potential to Achieve Goals: Good Progress towards PT goals: Progressing toward goals    Frequency    Min 2X/week      PT Plan Current plan remains appropriate    Co-evaluation              AM-PAC PT "6 Clicks" Mobility   Outcome Measure  Help needed turning from your back to your side while in a flat bed without using bedrails?: A Little Help needed moving from lying on your back to sitting on the side of a flat bed without using bedrails?: A Little Help needed moving to and from a bed to a chair (including a wheelchair)?: A Lot Help needed standing up from a chair using your arms (e.g., wheelchair or bedside chair)?: A Little Help needed to walk in hospital room?: A Little Help needed climbing 3-5 steps with a railing? : Total 6 Click Score: 15    End of Session Equipment Utilized During Treatment: Gait belt Activity Tolerance: Patient tolerated treatment well Patient left: in chair;with chair alarm set;with family/visitor present Nurse Communication: Mobility status PT Visit Diagnosis: Unsteadiness on feet (R26.81);Muscle weakness (generalized) (M62.81);History of falling (Z91.81)     Time: 8502-7741 PT Time Calculation (min) (ACUTE ONLY): 38 min  Charges:  $Gait Training: 23-37 mins $Therapeutic Activity: 8-22 mins                     Roney Marion, PT  Acute Rehabilitation Services Pager [phone] Office [place] 01/25/2021, 2:38 PM

## 2021-01-26 DIAGNOSIS — N400 Enlarged prostate without lower urinary tract symptoms: Secondary | ICD-10-CM | POA: Diagnosis not present

## 2021-01-26 DIAGNOSIS — M549 Dorsalgia, unspecified: Secondary | ICD-10-CM | POA: Diagnosis not present

## 2021-01-26 DIAGNOSIS — N1831 Chronic kidney disease, stage 3a: Secondary | ICD-10-CM | POA: Diagnosis not present

## 2021-01-26 DIAGNOSIS — I2510 Atherosclerotic heart disease of native coronary artery without angina pectoris: Secondary | ICD-10-CM | POA: Diagnosis not present

## 2021-01-26 DIAGNOSIS — M6281 Muscle weakness (generalized): Secondary | ICD-10-CM | POA: Diagnosis not present

## 2021-01-26 DIAGNOSIS — I255 Ischemic cardiomyopathy: Secondary | ICD-10-CM | POA: Diagnosis not present

## 2021-01-26 DIAGNOSIS — N1832 Chronic kidney disease, stage 3b: Secondary | ICD-10-CM | POA: Diagnosis not present

## 2021-01-26 DIAGNOSIS — E785 Hyperlipidemia, unspecified: Secondary | ICD-10-CM | POA: Diagnosis not present

## 2021-01-26 DIAGNOSIS — R600 Localized edema: Secondary | ICD-10-CM | POA: Diagnosis not present

## 2021-01-26 DIAGNOSIS — E039 Hypothyroidism, unspecified: Secondary | ICD-10-CM | POA: Diagnosis not present

## 2021-01-26 DIAGNOSIS — R0609 Other forms of dyspnea: Secondary | ICD-10-CM | POA: Diagnosis not present

## 2021-01-26 DIAGNOSIS — Z7401 Bed confinement status: Secondary | ICD-10-CM | POA: Diagnosis not present

## 2021-01-26 DIAGNOSIS — S61411D Laceration without foreign body of right hand, subsequent encounter: Secondary | ICD-10-CM | POA: Diagnosis not present

## 2021-01-26 DIAGNOSIS — I5032 Chronic diastolic (congestive) heart failure: Secondary | ICD-10-CM | POA: Diagnosis not present

## 2021-01-26 DIAGNOSIS — E7800 Pure hypercholesterolemia, unspecified: Secondary | ICD-10-CM | POA: Diagnosis not present

## 2021-01-26 DIAGNOSIS — M2550 Pain in unspecified joint: Secondary | ICD-10-CM | POA: Diagnosis not present

## 2021-01-26 DIAGNOSIS — R338 Other retention of urine: Secondary | ICD-10-CM | POA: Diagnosis not present

## 2021-01-26 DIAGNOSIS — R531 Weakness: Secondary | ICD-10-CM | POA: Diagnosis not present

## 2021-01-26 DIAGNOSIS — D631 Anemia in chronic kidney disease: Secondary | ICD-10-CM | POA: Diagnosis not present

## 2021-01-26 DIAGNOSIS — N3281 Overactive bladder: Secondary | ICD-10-CM | POA: Diagnosis not present

## 2021-01-26 DIAGNOSIS — R339 Retention of urine, unspecified: Secondary | ICD-10-CM | POA: Diagnosis not present

## 2021-01-26 DIAGNOSIS — Z8679 Personal history of other diseases of the circulatory system: Secondary | ICD-10-CM | POA: Diagnosis not present

## 2021-01-26 DIAGNOSIS — Z86718 Personal history of other venous thrombosis and embolism: Secondary | ICD-10-CM | POA: Diagnosis not present

## 2021-01-26 DIAGNOSIS — R2681 Unsteadiness on feet: Secondary | ICD-10-CM | POA: Diagnosis not present

## 2021-01-26 DIAGNOSIS — Z9181 History of falling: Secondary | ICD-10-CM | POA: Diagnosis not present

## 2021-01-26 DIAGNOSIS — E2740 Unspecified adrenocortical insufficiency: Secondary | ICD-10-CM | POA: Diagnosis not present

## 2021-01-26 DIAGNOSIS — G309 Alzheimer's disease, unspecified: Secondary | ICD-10-CM | POA: Diagnosis not present

## 2021-01-26 DIAGNOSIS — I129 Hypertensive chronic kidney disease with stage 1 through stage 4 chronic kidney disease, or unspecified chronic kidney disease: Secondary | ICD-10-CM | POA: Diagnosis not present

## 2021-01-26 DIAGNOSIS — N179 Acute kidney failure, unspecified: Secondary | ICD-10-CM | POA: Diagnosis not present

## 2021-01-26 DIAGNOSIS — N401 Enlarged prostate with lower urinary tract symptoms: Secondary | ICD-10-CM | POA: Diagnosis not present

## 2021-01-26 DIAGNOSIS — W19XXXD Unspecified fall, subsequent encounter: Secondary | ICD-10-CM | POA: Diagnosis not present

## 2021-01-26 DIAGNOSIS — M6289 Other specified disorders of muscle: Secondary | ICD-10-CM | POA: Diagnosis not present

## 2021-01-26 DIAGNOSIS — U071 COVID-19: Secondary | ICD-10-CM | POA: Diagnosis not present

## 2021-01-26 LAB — BASIC METABOLIC PANEL
Anion gap: 8 (ref 5–15)
BUN: 13 mg/dL (ref 8–23)
CO2: 25 mmol/L (ref 22–32)
Calcium: 8.9 mg/dL (ref 8.9–10.3)
Chloride: 104 mmol/L (ref 98–111)
Creatinine, Ser: 1.36 mg/dL — ABNORMAL HIGH (ref 0.61–1.24)
GFR, Estimated: 51 mL/min — ABNORMAL LOW (ref >60)
Glucose, Bld: 102 mg/dL — ABNORMAL HIGH (ref 70–99)
Potassium: 4.2 mmol/L (ref 3.5–5.1)
Sodium: 137 mmol/L (ref 135–145)

## 2021-01-26 LAB — CBC WITH DIFFERENTIAL/PLATELET
Abs Immature Granulocytes: 0.06 10*3/uL (ref 0.00–0.07)
Basophils Absolute: 0.1 10*3/uL (ref 0.0–0.1)
Basophils Relative: 1 %
Eosinophils Absolute: 0.1 10*3/uL (ref 0.0–0.5)
Eosinophils Relative: 1 %
HCT: 38.6 % — ABNORMAL LOW (ref 39.0–52.0)
Hemoglobin: 12.6 g/dL — ABNORMAL LOW (ref 13.0–17.0)
Immature Granulocytes: 1 %
Lymphocytes Relative: 13 %
Lymphs Abs: 1.3 10*3/uL (ref 0.7–4.0)
MCH: 27 pg (ref 26.0–34.0)
MCHC: 32.6 g/dL (ref 30.0–36.0)
MCV: 82.7 fL (ref 80.0–100.0)
Monocytes Absolute: 0.6 10*3/uL (ref 0.1–1.0)
Monocytes Relative: 6 %
Neutro Abs: 7.9 10*3/uL — ABNORMAL HIGH (ref 1.7–7.7)
Neutrophils Relative %: 78 %
Platelets: 276 10*3/uL (ref 150–400)
RBC: 4.67 MIL/uL (ref 4.22–5.81)
RDW: 20.2 % — ABNORMAL HIGH (ref 11.5–15.5)
WBC: 9.9 10*3/uL (ref 4.0–10.5)
nRBC: 0 % (ref 0.0–0.2)

## 2021-01-26 MED ORDER — METHOCARBAMOL 500 MG PO TABS: 500.0000 mg | ORAL_TABLET | Freq: Three times a day (TID) | ORAL | 0 refills | Status: AC | PRN

## 2021-01-26 MED ORDER — QUETIAPINE FUMARATE 25 MG PO TABS: 25.0000 mg | ORAL_TABLET | Freq: Every day | ORAL | 0 refills | Status: AC

## 2021-01-26 MED ORDER — TORSEMIDE 10 MG PO TABS: 10.0000 mg | ORAL_TABLET | Freq: Every day | ORAL | Status: DC | PRN

## 2021-01-26 MED ORDER — EVOLOCUMAB 140 MG/ML ~~LOC~~ SOSY
1.0000 mL | PREFILLED_SYRINGE | SUBCUTANEOUS | Status: DC
  Administered 2021-01-26: 1 mL via SUBCUTANEOUS
  Filled 2021-01-26: qty 1

## 2021-01-26 MED ORDER — HYDROCODONE-ACETAMINOPHEN 5-325 MG PO TABS: 1.0000 | ORAL_TABLET | Freq: Four times a day (QID) | ORAL | 0 refills | Status: AC | PRN

## 2021-01-26 MED ORDER — AMIODARONE HCL 100 MG PO TABS: 100.0000 mg | ORAL_TABLET | Freq: Every day | ORAL | Status: DC

## 2021-01-26 MED ORDER — SENNOSIDES-DOCUSATE SODIUM 8.6-50 MG PO TABS: 1.0000 | ORAL_TABLET | Freq: Every evening | ORAL | Status: AC | PRN

## 2021-01-26 NOTE — Progress Notes (Signed)
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  Brought to the ED on 4/24 with complaint of progressively increasing weakness for last few weeks, poor oral intake, orthostatic dizziness, leading to a fall, hit his head but did not have loss of consciousness. Per family, patient has a significant functional decline over the last year.  He does not get around as he used to.  Has mild cognitive impairment. Per previous chart, 11/30/2020, patient had nausea, diaphoresis, lightheadedness and briefly passed out.  He was seen in the ER and found to be bradycardic in the 40s.  Coreg and nebivolol were stopped.  Zio patch was placed for 2 weeks which showed 2 runs of V. Tach. Patient follow-up with cardiologist Dr. Aundra Dubin on 3/21.  Patient was started on amiodarone. Patient continued to have progressive weakness, he also had a fall and is brought to the ED  In the ED, patient had low heart rate in the 50s, has chronic bradycardia, hemodynamically stable. Labs noted AKI with a creatinine of 2.14, baseline 1.2 Admitted to hospital service for further evaluation management  Subjective: Patient was seen and examined this morning.  Sitting up in chair.  Not in distress.  He seems to be stressed about an episode of disorientation this morning.  Feels fine at this time.  Able to recall the whole story to tell me. Hemodynamically stable.  Assessment/Plan: Fall, progressive generalized weakness -I think due to bradycardia related to meds (amiodarone, Namenda, galantamine), patient was having low blood pressure, lethargy, orthostatic dizziness,  impaired kidney function and falls. -PT eval was obtained.  SNF recommended.  Acute urinary retention -4/26, patient had acute urinary retention.  1000 mL of clear urine was drained.  A coud catheter was placed. -Plan to continue catheter for next few days to discharge with the same and a voiding trial as an outpatient.  AKI on CKD 3b -Baseline creatinine less than 1.8 over the years. -Presented with creatinine elevated to 2.14, primarily due to poor oral intake, use of diuretics and bradycardia -Given adequate IV hydration.  Creatinine gradually improving and is close to baseline.  IV fluid is stopped.  Encourage oral hydration. Recent Labs    10/25/20 1435 11/30/20 1109 12/19/20 1208 01/22/21 1747 01/23/21 0703 01/24/21 0249 01/25/21 0224 01/26/21 0210  BUN 18 24* 19 29* 24* 18 12 13   CREATININE 1.48* 1.73* 1.26* 2.14* 1.94* 1.51* 1.28* 1.36*   Chronic bradycardia History of atrial bigeminy and nonsustained V. tach -Patient has history of atrial bigeminy and nonsustained V. tach.  He used to be on Coreg and nebivolol. They were stopped on 11/30/2020 when he was found to be bradycardic in one of the ED visits.   -3/21, he was started on amiodarone by cardiologist Dr. Aundra Dubin.   -4/25, I discussed the case with cardiologist Dr. Aundra Dubin.  With the concern of persistent bradycardia, he recommended to reduce amiodarone to 100 mg daily.  Heart rate remains stable in 50s. -Continue to monitor in telemetry.    CAD s/p stents Hyperlipidemia/statin intolerance -Aspirin, Ranexa, Repatha, Zetia  Cardiomyopathy Chronic diastolic CHF -Echo on 3/55 with EF 50 to 55% which is  better than previous echo, grade 1 diastolic dysfunction -Cardiolite with a EF 49% no ischemia. -He was on torsemide 10 mg every other day at home.  Currently on hold because of dehydration.  History of DVT/PE -He had posttraumatic DVT in 11/19.  He was on Eliquis 2.5 milligrams twice daily for long-term use.  With the  new concern of weakness and falls, I think it would be more reasonable to stop Eliquis.  Discussed with family and Dr. Aundra Dubin.  Eliquis is stopped.  Patient to continue aspirin 81 mg daily.  Chronic progressive dyspnea -Seen by cardiology and pulmonary in the past.  No clear etiology.  Probably due to progressive deconditioning  Chronic bilateral low back pain without sciatica -Continue Norco as needed for pain as needed.  -Added Robaxin   Mild dementia -At home, patient was on Namenda, Galantamine and Wellbutrin. -I think patient was getting cholinergic side effects from Namenda and galantamine causing bradycardia.  I discussed with family.  Because medications were stopped.Faythe Ghee to continue Wellbutrin.  Added Seroquel 25 mg nightly to improve circadian rhythm  History of pituitary adenoma s/p resection with resultant hypothyroidism and adrenal insufficiency. -Continue cabergoline 0.5 mg weekly, Synthroid 150 mcg daily and Hydrocortisone 40 mg in the morning and 20 mg in the evening, as well as testosterone injection every 3 weeks.  Mobility: PT eval obtained.  SNF recommended. Code Status:   Code Status: Full Code full code Nutritional status: Body mass index is 29.75 kg/m.     Diet Order            Diet Heart Room service appropriate? Yes; Fluid consistency: Thin  Diet effective now                 DVT prophylaxis:    Antimicrobials:  None Fluid: Not on IV fluid Consultants: None Family Communication:  None at bedside  Status is: Inpatient  Remains inpatient appropriate because: PT eval obtained.  SNF recommended  Dispo: The patient is from: Home              Anticipated d/c is to: SNF              Patient currently is medically stable to d/c.   Difficult to place patient No     Infusions:    Scheduled Meds: . amiodarone  100 mg Oral Daily  . aspirin EC  81 mg Oral Daily  . buPROPion  150 mg Oral Daily  . cabergoline  0.5 mg Oral Q Wed  . Chlorhexidine  Gluconate Cloth  6 each Topical Daily  . Evolocumab  1 mL Subcutaneous Q14 Days  . ezetimibe  10 mg Oral Daily  . hydrocortisone  20 mg Oral QHS  . hydrocortisone  40 mg Oral Daily  . levothyroxine  150 mcg Oral QHS  . mometasone-formoterol  2 puff Inhalation BID  . QUEtiapine  25 mg Oral QHS    Antimicrobials: Anti-infectives (From admission, onward)   None      PRN meds: acetaminophen **OR** acetaminophen, albuterol, HYDROcodone-acetaminophen, methocarbamol, ondansetron **OR** ondansetron (ZOFRAN) IV, senna-docusate   Objective: Vitals:   01/25/21 2127 01/26/21 0812  BP: 125/64   Pulse: (!) 53   Resp: 17   Temp: 98 F (36.7 C)   SpO2: 97% 98%    Intake/Output Summary (Last 24 hours) at 01/26/2021 1106 Last data filed at 01/25/2021 1500 Gross per 24 hour  Intake 120 ml  Output --  Net 120 ml   Danley Danker  Weights   01/22/21 1725  Weight: 108 kg   Weight change:  Body mass index is 29.75 kg/m.   Physical Exam: General exam: Pleasant elderly Caucasian male.  Not in physical distress.   Skin: No rashes, lesions or ulcers. HEENT: Atraumatic, normocephalic, no obvious bleeding Lungs: Clear to auscultation bilaterally CVS: Bradycardic, no murmur GI/Abd soft, nontender, nondistended, bowel sound present CNS: Alert, awake, oriented to place and person.  Not restless or agitated. Psychiatry: Mood appropriate Extremities: No pedal edema, no calf tenderness  Data Review: I have personally reviewed the laboratory data and studies available.  Recent Labs  Lab 01/22/21 1747 01/24/21 0249 01/25/21 0224 01/26/21 0210  WBC 8.9 7.5 10.0 9.9  NEUTROABS 7.2 6.0 7.7 7.9*  HGB 13.7 13.2 13.4 12.6*  HCT 42.3 40.5 41.4 38.6*  MCV 83.9 83.2 83.3 82.7  PLT 314 305 298 276   Recent Labs  Lab 01/22/21 1747 01/23/21 0703 01/24/21 0249 01/25/21 0224 01/26/21 0210  NA 138 139 136 138 137  K 4.6 3.6 4.2 3.8 4.2  CL 103 106 102 102 104  CO2 24 26 24 24 25   GLUCOSE 102* 74  104* 90 102*  BUN 29* 24* 18 12 13   CREATININE 2.14* 1.94* 1.51* 1.28* 1.36*  CALCIUM 8.9 8.7* 9.1 9.4 8.9  MG  --   --  2.1  --   --   PHOS  --   --  2.7  --   --     F/u labs ordered Unresulted Labs (From admission, onward)         None      Signed, Terrilee Croak, MD [facility] 01/26/2021

## 2021-01-26 NOTE — Discharge Summary (Addendum)
Physician Discharge Summary  Layke Catrett Denton, [name] [id number] DOB: 06/18/1934 DOA: 01/22/2021  PCP: [name], MD  Admit date: 01/22/2021 Discharge date: 01/26/2021 Admitted From: Home Discharge disposition: SNF   Code Status: Full Code  Diet Recommendation: Cardiac diet  Discharge Diagnosis:   Principal Problem:   AKI (acute kidney injury) ([place]) Active Problems:   Mild cognitive disorder   CAD (coronary artery disease)   Chronic bilateral low back pain without sciatica   HTN (hypertension)   Bradycardia   Generalized weakness   Fall at home, initial encounter   Skin tear of right hand without complication  Chief Complaint  Patient presents with  . 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  Brought to the ED on 4/24 with complaint of progressively increasing weakness for last few weeks, poor oral intake, orthostatic dizziness, leading to a fall, hit his head but did not have loss of consciousness. Per family, patient has a significant functional decline over the last year.  He does not get around as he used to.  Has mild cognitive impairment. Per previous chart, 11/30/2020, patient had nausea, diaphoresis, lightheadedness and briefly passed out.  He was seen in the ER and found to be bradycardic in the 40s.  Coreg and nebivolol were stopped.  Zio patch was placed for 2 weeks which showed 2 runs of V. Tach. Patient follow-up with cardiologist Dr. Aundra Dubin on 3/21.  Patient was started on amiodarone. Patient continued to have progressive weakness, he also had a fall and is brought to the ED  In the ED, patient had low heart rate in the 50s, has chronic bradycardia, hemodynamically stable. Labs noted AKI with a creatinine of 2.14, baseline 1.2 Admitted to hospital service for  further evaluation management  Subjective: Patient was seen and examined this morning.  Sitting up in chair.  Not in distress.  He seems to be stressed about an episode of disorientation this morning.  Feels fine at this time.  Able to recall the whole story to tell me. Hemodynamically stable.   Hospital Course: Fall, progressive generalized weakness -I think due to bradycardia related to meds (amiodarone, Namenda, galantamine), patient was having low blood pressure, lethargy, orthostatic dizziness, impaired kidney function and falls. -PT eval was obtained.  SNF recommended.  Acute urinary retention -4/26, patient had acute urinary retention.  1000 mL of clear urine was drained.  A coud catheter was placed. -Plan to continue catheter for next few days post discharge and a voiding trial as an outpatient.  AKI on CKD 3b -Baseline creatinine less than 1.8 over the years. -Presented with creatinine elevated to 2.14, primarily due to poor oral intake, use of diuretics and bradycardia -Given adequate IV hydration.  Creatinine gradually improving and is close to baseline.  IV fluid is stopped.  Encourage oral hydration. Recent Labs    10/25/20 1435 11/30/20 1109 12/19/20 1208 01/22/21 1747 01/23/21 0703 01/24/21 0249 01/25/21 0224 01/26/21 0210  BUN 18 24* 19 29* 24* 18 12 13   CREATININE 1.48* 1.73* 1.26* 2.14* 1.94* 1.51* 1.28* 1.36*   Chronic bradycardia History of atrial bigeminy and nonsustained V. tach -Patient has history of atrial bigeminy and nonsustained V. tach.  He used to be on Coreg and nebivolol. They were stopped on 11/30/2020 when he was found to be bradycardic in one of  the ED visits.   -3/21, he was started on amiodarone by cardiologist Dr. Aundra Dubin.   -4/25, I discussed the case with cardiologist Dr. Aundra Dubin.  With the concern of persistent bradycardia, he recommended to reduce amiodarone to 100 mg daily.  Heart rate remains stable in 50s. -Continue to monitor in  telemetry.    CAD s/p stents Hyperlipidemia/statin intolerance -Aspirin, Ranexa, Repatha, Zetia  Cardiomyopathy Chronic diastolic CHF -Echo on [id number] with EF 50 to 55% which is better than previous echo, grade 1 diastolic dysfunction -Cardiolite with a EF 49% no ischemia. -He was on torsemide 10 mg every other day at home.  Currently on hold because of dehydration.  Can resume as needed based on weight gain, pedal edema and shortness of breath.  History of DVT/PE -He had posttraumatic DVT in 11/19.  He was on Eliquis 2.5 milligrams twice daily for long-term use.  With the new concern of weakness and falls, I think it would be more reasonable to stop Eliquis.  Discussed with family and Dr. Aundra Dubin.  Eliquis is stopped.  Patient to continue aspirin 81 mg daily.  Chronic progressive dyspnea -Seen by cardiology and pulmonary in the past.  No clear etiology.  Probably due to progressive deconditioning  Chronic bilateral low back pain without sciatica -Continue Norco as needed for pain as needed.  -Added Robaxin   Mild dementia -At home, patient was on Namenda, Galantamine and Wellbutrin. -I think patient was getting cholinergic side effects from Namenda and galantamine causing bradycardia.  I discussed with family.  Because medications were stopped.Faythe Ghee to continue Wellbutrin.  Added Seroquel 25 mg nightly to improve circadian rhythm  History of pituitary adenoma s/p resection with resultant hypothyroidism and adrenal insufficiency. -Continue cabergoline 0.5 mg weekly, Synthroid 150 mcg daily and Hydrocortisone 40 mg in the morning and 20 mg in the evening, as well as testosterone injection every 3 weeks.   Wound care:    Discharge Exam:   Vitals:   01/25/21 0434 01/25/21 0839 01/25/21 2127 01/26/21 0812  BP: 129/66  125/64   Pulse: (!) 55  (!) 53   Resp: 17  17   Temp: 97.9 F (36.6 C)  98 F (36.7 C)   TempSrc: Axillary     SpO2: 98% 97% 97% 98%  Weight:      Height:         Body mass index is 29.75 kg/m.  General exam: Pleasant elderly Caucasian male.  Not in physical distress.   Skin: No rashes, lesions or ulcers. HEENT: Atraumatic, normocephalic, no obvious bleeding Lungs: Clear to auscultation bilaterally CVS: Bradycardic, no murmur GI/Abd soft, nontender, nondistended, bowel sound present CNS: Alert, awake, oriented to place and person.  Not restless or agitated. Psychiatry: Mood appropriate Extremities: No pedal edema, no calf tenderness  Follow ups:   Discharge Instructions    Diet - low sodium heart healthy   Complete by: As directed    Increase activity slowly   Complete by: As directed       Follow-up Information    [name], MD Follow up.   Specialty: Internal Medicine Contact information: [address] [place] [address] [place] [facility].   Contact information: [place] [phone]              Recommendations for Outpatient Follow-Up:   1. Follow-up with [facility] as an outpatient 2. Follow-up with  PCP as an outpatient  Discharge Instructions:  Follow with Primary MD [name], MD in 7 days   Get CBC/BMP checked in next visit within 1 week by PCP or SNF MD ( we routinely change or add medications that can affect your baseline labs and fluid status, therefore we recommend that you get the mentioned basic workup next visit with your PCP, your PCP may decide not to get them or add new tests based on their clinical decision)  On your next visit with your PCP, please Get Medicines reviewed and adjusted.  Please request your PCP  to go over all Hospital Tests and Procedure/Radiological results at the follow up, please get all Hospital records sent to your Prim MD by signing hospital release before you go home.  Activity: As tolerated with Full fall precautions use walker/cane & assistance as needed  For Heart  failure patients - Check your Weight same time everyday, if you gain over 2 pounds, or you develop in leg swelling, experience more shortness of breath or chest pain, call your Primary MD immediately. Follow Cardiac Low Salt Diet and 1.5 lit/day fluid restriction.  If you have smoked or chewed Tobacco in the last 2 yrs please stop smoking, stop any regular Alcohol  and or any Recreational drug use.  If you experience worsening of your admission symptoms, develop shortness of breath, life threatening emergency, suicidal or homicidal thoughts you must seek medical attention immediately by calling 911 or calling your MD immediately  if symptoms less severe.  You Must read complete instructions/literature along with all the possible adverse reactions/side effects for all the Medicines you take and that have been prescribed to you. Take any new Medicines after you have completely understood and accpet all the possible adverse reactions/side effects.   Do not drive, operate heavy machinery, perform activities at heights, swimming or participation in water activities or provide baby sitting services if your were admitted for syncope or siezures until you have seen by Primary MD or a Neurologist and advised to do so again.  Do not drive when taking Pain medications.  Do not take more than prescribed Pain, Sleep and Anxiety Medications  Wear Seat belts while driving.   Please note You were cared for by a hospitalist during your hospital stay. If you have any questions about your discharge medications or the care you received while you were in the hospital after you are discharged, you can call the unit and asked to speak with the hospitalist on call if the hospitalist that took care of you is not available. Once you are discharged, your primary care physician will handle any further medical issues. Please note that NO REFILLS for any discharge medications will be authorized once you are discharged, as it is  imperative that you return to your primary care physician (or establish a relationship with a primary care physician if you do not have one) for your aftercare needs so that they can reassess your need for medications and monitor your lab values.    Allergies as of 01/26/2021      Reactions   Sulfa Antibiotics Anaphylaxis, Nausea And Vomiting, Swelling   Sulfamethoxazole Anaphylaxis, Nausea And Vomiting, Swelling   Sulfonamide Derivatives Anaphylaxis, Nausea And Vomiting, Swelling   Bee Venom Rash   Flonase [fluticasone] Other (See Comments)   Caused nose bleeds   Tamsulosin Other (See Comments)   Unknown reaction- Patient doesn't think he is allergic (??)      Medication List  STOP taking these medications   Eliquis 2.5 MG Tabs tablet Generic drug: apixaban   loratadine 10 MG tablet Commonly known as: CLARITIN   memantine 5 MG tablet Commonly known as: NAMENDA   zinc sulfate 220 (50 Zn) MG capsule     TAKE these medications   Advair HFA 230-21 MCG/ACT inhaler Generic drug: fluticasone-salmeterol Inhale 2 puffs into the lungs 2 (two) times daily. What changed: when to take this   albuterol 108 (90 Base) MCG/ACT inhaler Commonly known as: VENTOLIN HFA Inhale 2 puffs into the lungs every 6 (six) hours as needed for wheezing or shortness of breath.   amiodarone 100 MG tablet Commonly known as: PACERONE Take 1 tablet (100 mg total) by mouth daily. Start taking on: January 27, 2021 What changed:   medication strength  See the new instructions.   ascorbic acid 500 MG tablet Commonly known as: VITAMIN C Take 1 tablet (500 mg total) by mouth daily.   aspirin EC 81 MG tablet Take 1 tablet (81 mg total) by mouth daily.   buPROPion 150 MG 24 hr tablet Commonly known as: WELLBUTRIN XL Take 150 mg by mouth daily.   cabergoline 0.5 MG tablet Commonly known as: DOSTINEX Take 0.5 mg by mouth every Wednesday.   ezetimibe 10 MG tablet Commonly known as: ZETIA TAKE 1  TABLET(10 MG) BY MOUTH DAILY What changed: See the new instructions.   fluticasone 50 MCG/ACT nasal spray Commonly known as: FLONASE Place 2 sprays into both nostrils daily. What changed:   when to take this  reasons to take this   galantamine 8 MG 24 hr capsule Commonly known as: RAZADYNE ER Take 8 mg by mouth every morning.   HYDROcodone-acetaminophen 5-325 MG tablet Commonly known as: NORCO/VICODIN Take 1 tablet by mouth every 6 (six) hours as needed for up to 5 days for moderate pain or severe pain.   hydrocortisone 20 MG tablet Commonly known as: CORTEF Take 20-40 mg by mouth See admin instructions. 40 mg every morning, 20 mg every evening   levothyroxine 150 MCG tablet Commonly known as: SYNTHROID Take 150 mcg by mouth daily before breakfast.   methocarbamol 500 MG tablet Commonly known as: ROBAXIN Take 1 tablet (500 mg total) by mouth every 8 (eight) hours as needed for up to 5 days for muscle spasms.   Myrbetriq 50 MG Tb24 tablet Generic drug: mirabegron ER Take 50 mg by mouth at bedtime.   nitroGLYCERIN 0.4 MG SL tablet Commonly known as: NITROSTAT Place 1 tablet (0.4 mg total) under the tongue every 5 (five) minutes as needed for chest pain.   potassium chloride 10 MEQ tablet Commonly known as: KLOR-CON Take 2 tablets (20 mEq total) by mouth daily.   QUEtiapine 25 MG tablet Commonly known as: SEROQUEL Take 1 tablet (25 mg total) by mouth at bedtime for 5 days.   ranolazine 500 MG 12 hr tablet Commonly known as: RANEXA TAKE 1 TABLET(500 MG) BY MOUTH TWICE DAILY What changed: See the new instructions.   Repatha 140 MG/ML Sosy Generic drug: Evolocumab Inject 1 mL into the skin every 14 (fourteen) days.   senna-docusate 8.6-50 MG tablet Commonly known as: Senokot-S Take 1 tablet by mouth at bedtime as needed for mild constipation.   testosterone cypionate 200 MG/ML injection Commonly known as: DEPOTESTOSTERONE CYPIONATE Inject 100 mg into the  muscle every 21 ( twenty-one) days.   torsemide 10 MG tablet Commonly known as: DEMADEX Take 1 tablet (10 mg total) by mouth daily as  needed (pedal edema). What changed:   when to take this  reasons to take this   triamcinolone cream 0.1 % Commonly known as: KENALOG Apply 1 application topically 2 (two) times daily as needed (irritation).   Vascepa 1 g capsule Generic drug: icosapent Ethyl TAKE 2 CAPSULES(2 GRAMS) BY MOUTH TWICE DAILY What changed: See the new instructions.       Time coordinating discharge: 35 minutes  The results of significant diagnostics from this hospitalization (including imaging, microbiology, ancillary and laboratory) are listed below for reference.    Procedures and Diagnostic Studies:   DG Chest 1 View  Result Date: 01/22/2021 CLINICAL DATA:  Fall, dizziness EXAM: CHEST  1 VIEW COMPARISON:  Chest radiograph dated 10/13/2019. FINDINGS: The heart size and mediastinal contours are within normal limits. There is mild bibasilar atelectasis. There is no pleural effusion or pneumothorax. The visualized skeletal structures are unremarkable. IMPRESSION: Mild bibasilar atelectasis. Electronically Signed   By: Zerita Boers M.D.   On: 01/22/2021 18:40   DG Pelvis 1-2 Views  Result Date: 01/22/2021 CLINICAL DATA:  Fall with pain. EXAM: PELVIS - 1-2 VIEW COMPARISON:  CT abdomen pelvis dated 12/02/2016. FINDINGS: There is no evidence of pelvic fracture or diastasis. No pelvic bone lesions are seen. Mild degenerative changes are seen in both hips. Degenerative changes are seen in the lumbar spine. IMPRESSION: No acute osseous injury. Electronically Signed   By: Zerita Boers M.D.   On: 01/22/2021 18:41   CT Head Wo Contrast  Result Date: 01/22/2021 CLINICAL DATA:  Fall this morning with head and neck pain. EXAM: CT HEAD WITHOUT CONTRAST CT CERVICAL SPINE WITHOUT CONTRAST TECHNIQUE: Multidetector CT imaging of the head and cervical spine was performed following the  standard protocol without intravenous contrast. Multiplanar CT image reconstructions of the cervical spine were also generated. COMPARISON:  CT head dated 12/02/2016. FINDINGS: CT HEAD FINDINGS Brain: No evidence of acute infarction, hemorrhage, hydrocephalus, extra-axial collection or mass lesion/mass effect. There is mild cerebral volume loss with associated ex vacuo dilatation. Periventricular white matter hypoattenuation likely represents chronic small vessel ischemic disease. 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 Vascular: No hyperdense vessel or unexpected calcification. Skull: Normal. Negative for fracture or focal lesion. Sinuses/Orbits: No acute finding. Other: None. CT CERVICAL SPINE FINDINGS Alignment: Normal. Skull base and vertebrae: No acute fracture. No primary bone lesion or focal pathologic process. Soft tissues and spinal canal: No prevertebral fluid or swelling. No visible canal hematoma. Disc levels:  Severe multilevel degenerative disc and joint disease. Upper chest: Left apical scarring is partially imaged. Other: None. IMPRESSION: 1. No acute intracranial process. 2. No acute osseous injury in the cervical spine. Electronically Signed   By: Zerita Boers M.D.   On: 01/22/2021 18:39   CT Cervical Spine Wo Contrast  Result Date: 01/22/2021 CLINICAL DATA:  Fall this morning with head and neck pain. EXAM: CT HEAD WITHOUT CONTRAST CT CERVICAL SPINE WITHOUT CONTRAST TECHNIQUE: Multidetector CT imaging of the head and cervical spine was performed following the standard protocol without intravenous contrast. Multiplanar CT image reconstructions of the cervical spine were also generated. COMPARISON:  CT head dated 12/02/2016. FINDINGS: CT HEAD FINDINGS Brain: No evidence of acute infarction, hemorrhage, hydrocephalus, extra-axial collection or mass lesion/mass effect. There is mild cerebral volume loss with associated ex vacuo dilatation. Periventricular white matter hypoattenuation likely  represents chronic small vessel ischemic disease. 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 Vascular: No hyperdense vessel or unexpected calcification. Skull: Normal. Negative for fracture or focal lesion.  Sinuses/Orbits: No acute finding. Other: None. CT CERVICAL SPINE FINDINGS Alignment: Normal. Skull base and vertebrae: No acute fracture. No primary bone lesion or focal pathologic process. Soft tissues and spinal canal: No prevertebral fluid or swelling. No visible canal hematoma. Disc levels:  Severe multilevel degenerative disc and joint disease. Upper chest: Left apical scarring is partially imaged. Other: None. IMPRESSION: 1. No acute intracranial process. 2. No acute osseous injury in the cervical spine. Electronically Signed   By: Zerita Boers M.D.   On: 01/22/2021 18:39   CT Lumbar Spine Wo Contrast  Result Date: 01/22/2021 CLINICAL DATA:  Worsening low back pain for greater than 1 month. Fall today. Increased confusion and weakness. EXAM: CT LUMBAR SPINE WITHOUT CONTRAST TECHNIQUE: Multidetector CT imaging of the lumbar spine was performed without intravenous contrast administration. Multiplanar CT image reconstructions were also generated. COMPARISON:  CT abdomen and pelvis 12/02/2016. Lumbar spine MRI 10/30/2007. FINDINGS: Segmentation: 5 lumbar type vertebrae. Alignment: Mild levoscoliosis with apex at L3. No significant listhesis. Vertebrae: Large hemangioma in the L2 vertebral body. Slight chronic anterior wedging of the T12 and L1 vertebral bodies, unchanged from the prior CT. No acute fracture. Remote left L3 transverse process fracture. Diffuse osteopenia. Paraspinal and other soft tissues: Abdominal aortic atherosclerosis without aneurysm. Partially visualized large right lower pole renal cyst. Hiatal hernia. Disc levels: L1-2: Mild disc bulging, endplate spurring, and mild facet arthrosis without evidence of significant stenosis. L2-3: Disc bulging, endplate spurring, and moderate facet  hypertrophy result in right greater than left lateral recess stenosis without evidence of significant generalized spinal stenosis or neural foraminal stenosis, similar to the prior MRI. L3-4: Disc bulging, endplate spurring, and moderate facet hypertrophy. Facet hypertrophy has mildly progressed from the prior MRI, however there is no evidence of significant stenosis. L4-5: Disc narrowing and calcification. Disc bulging, endplate spurring, and severe facet hypertrophy have progressed and result in mild spinal stenosis, bilateral lateral recess stenosis, and moderate to severe bilateral neural foraminal stenosis. L5-S1: Prior left laminotomy. Fusion across the disc space. Endplate spurring and severe facet hypertrophy have progressed and result in mild right and moderate left neural foraminal stenosis without spinal stenosis. IMPRESSION: 1. No acute osseous abnormality identified in the lumbar spine. 2. Progressive lumbar disc degeneration and severe facet hypertrophy in the lower lumbar spine compared to a 2009 MRI. 3. Mild spinal stenosis and moderate to severe bilateral neural foraminal stenosis at L4-5. 4. Moderate left neural foraminal stenosis at L5-S1. 5. Aortic Atherosclerosis (ICD10-I70.0). Electronically Signed   By: Logan Bores M.D.   On: 01/22/2021 18:37   DG Knee Complete 4 Views Right  Result Date: 01/22/2021 CLINICAL DATA:  Fall with knee pain EXAM: RIGHT KNEE - COMPLETE 4+ VIEW COMPARISON:  None. FINDINGS: The patient is status post a knee arthroplasty. No acute osseous injury. No knee joint dislocation or knee joint effusion. The hardware appears intact and well aligned. IMPRESSION: No acute osseous injury. Electronically Signed   By: Zerita Boers M.D.   On: 01/22/2021 18:44   DG Hand Complete Right  Result Date: 01/22/2021 CLINICAL DATA:  Fall with hand pain EXAM: RIGHT HAND - COMPLETE 3+ VIEW COMPARISON:  None. FINDINGS: There is no evidence of fracture or dislocation. Severe degenerative  changes are seen at the first carpal metacarpal joint. IMPRESSION: No acute osseous injury. Electronically Signed   By: Zerita Boers M.D.   On: 01/22/2021 18:43     Labs:   Basic Metabolic Panel: Recent Labs  Lab 01/22/21 1747 01/23/21  0703 01/24/21 0249 01/25/21 0224 01/26/21 0210  NA 138 139 136 138 137  K 4.6 3.6 4.2 3.8 4.2  CL 103 106 102 102 104  CO2 24 26 24 24 25   GLUCOSE 102* 74 104* 90 102*  BUN 29* 24* 18 12 13   CREATININE 2.14* 1.94* 1.51* 1.28* 1.36*  CALCIUM 8.9 8.7* 9.1 9.4 8.9  MG  --   --  2.1  --   --   PHOS  --   --  2.7  --   --    GFR Estimated Creatinine Clearance: 53.7 mL/min (A) (by C-G formula based on SCr of 1.36 mg/dL (H)). Liver Function Tests: Recent Labs  Lab 01/22/21 1747  AST 32  ALT 28  ALKPHOS 62  BILITOT 0.9  PROT 5.9*  ALBUMIN 3.3*   No results for input(s): LIPASE, AMYLASE in the last 168 hours. No results for input(s): AMMONIA in the last 168 hours. Coagulation profile No results for input(s): INR, PROTIME in the last 168 hours.  CBC: Recent Labs  Lab 01/22/21 1747 01/24/21 0249 01/25/21 0224 01/26/21 0210  WBC 8.9 7.5 10.0 9.9  NEUTROABS 7.2 6.0 7.7 7.9*  HGB 13.7 13.2 13.4 12.6*  HCT 42.3 40.5 41.4 38.6*  MCV 83.9 83.2 83.3 82.7  PLT 314 305 298 276   Cardiac Enzymes: Recent Labs  Lab 01/22/21 1747  CKTOTAL 119   BNP: Invalid input(s): POCBNP CBG: No results for input(s): GLUCAP in the last 168 hours. D-Dimer No results for input(s): DDIMER in the last 72 hours. Hgb A1c No results for input(s): HGBA1C in the last 72 hours. Lipid Profile No results for input(s): CHOL, HDL, LDLCALC, TRIG, CHOLHDL, LDLDIRECT in the last 72 hours. Thyroid function studies No results for input(s): TSH, T4TOTAL, T3FREE, THYROIDAB in the last 72 hours.  Invalid input(s): FREET3 Anemia work up No results for input(s): VITAMINB12, FOLATE, FERRITIN, TIBC, IRON, RETICCTPCT in the last 72 hours. Microbiology Recent Results  (from the past 240 hour(s))  Urine Culture     Status: None   Collection Time: 01/22/21  5:38 PM   Specimen: Urine, Random  Result Value Ref Range Status   Specimen Description URINE, RANDOM  Final   Special Requests NONE  Final   Culture   Final    NO GROWTH Performed at [facility], [address] [place], [place] [address]    Report Status 01/24/2021 FINAL  Final  Resp Panel by RT-PCR (Flu A&B, Covid) Nasopharyngeal Swab     Status: None   Collection Time: 01/22/21  8:57 PM   Specimen: Nasopharyngeal Swab; Nasopharyngeal(NP) swabs in vial transport medium  Result Value Ref Range Status   SARS Coronavirus 2 by RT PCR NEGATIVE NEGATIVE Final    Comment: (NOTE) SARS-CoV-2 target nucleic acids are NOT DETECTED.  The SARS-CoV-2 RNA is generally detectable in upper respiratory specimens during the acute phase of infection. The lowest concentration of SARS-CoV-2 viral copies this assay can detect is 138 copies/mL. A negative result does not preclude SARS-Cov-2 infection and should not be used as the sole basis for treatment or other patient management decisions. A negative result may occur with  improper specimen collection/handling, submission of specimen other than nasopharyngeal swab, presence of viral mutation(s) within the areas targeted by this assay, and inadequate number of viral copies(<138 copies/mL). A negative result must be combined with clinical observations, patient history, and epidemiological information. The expected result is Negative.  Fact Sheet for Patients:  EntrepreneurPulse.com.au  Fact  Sheet for Healthcare Providers:  IncredibleEmployment.be  This test is no t yet approved or cleared by the [place] FDA and  has been authorized for detection and/or diagnosis of SARS-CoV-2 by FDA under an Emergency Use Authorization (EUA). This EUA will remain  in effect (meaning this test can be used) for the duration of  the COVID-19 declaration under Section 564(b)(1) of the Act, 21 U.S.C.section 360bbb-3(b)(1), unless the authorization is terminated  or revoked sooner.       Influenza A by PCR NEGATIVE NEGATIVE Final   Influenza B by PCR NEGATIVE NEGATIVE Final    Comment: (NOTE) The Xpert Xpress SARS-CoV-2/FLU/RSV plus assay is intended as an aid in the diagnosis of influenza from Nasopharyngeal swab specimens and should not be used as a sole basis for treatment. Nasal washings and aspirates are unacceptable for Xpert Xpress SARS-CoV-2/FLU/RSV testing.  Fact Sheet for Patients: EntrepreneurPulse.com.au  Fact Sheet for Healthcare Providers: IncredibleEmployment.be  This test is not yet approved or cleared by the [place] FDA and has been authorized for detection and/or diagnosis of SARS-CoV-2 by FDA under an Emergency Use Authorization (EUA). This EUA will remain in effect (meaning this test can be used) for the duration of the COVID-19 declaration under Section 564(b)(1) of the Act, 21 U.S.C. section 360bbb-3(b)(1), unless the authorization is terminated or revoked.  Performed at [facility], [place] [address], [place], [place] [address]   SARS CORONAVIRUS 2 (TAT 6-24 HRS) Nasopharyngeal Nasopharyngeal Swab     Status: None   Collection Time: 01/25/21  9:44 AM   Specimen: Nasopharyngeal Swab  Result Value Ref Range Status   SARS Coronavirus 2 NEGATIVE NEGATIVE Final    Comment: (NOTE) SARS-CoV-2 target nucleic acids are NOT DETECTED.  The SARS-CoV-2 RNA is generally detectable in upper and lower respiratory specimens during the acute phase of infection. Negative results do not preclude SARS-CoV-2 infection, do not rule out co-infections with other pathogens, and should not be used as the sole basis for treatment or other patient management decisions. Negative results must be combined with clinical observations, patient history, and  epidemiological information. The expected result is Negative.  Fact Sheet for Patients: SugarRoll.be  Fact Sheet for Healthcare Providers: https://www.woods-mathews.com/  This test is not yet approved or cleared by the [place] FDA and  has been authorized for detection and/or diagnosis of SARS-CoV-2 by FDA under an Emergency Use Authorization (EUA). This EUA will remain  in effect (meaning this test can be used) for the duration of the COVID-19 declaration under Se ction 564(b)(1) of the Act, 21 U.S.C. section 360bbb-3(b)(1), unless the authorization is terminated or revoked sooner.  Performed at [facility], [place] [address] [place], [place] [address]      Signed: Terrilee Croak  [facility] 01/26/2021, 1:17 PM

## 2021-01-26 NOTE — Progress Notes (Signed)
Report called to [facility] and given to [facility]

## 2021-01-26 NOTE — TOC Transition Note (Signed)
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 RN, patient, patient's family, and facility notified of DC. Discharge Summary and FL2 sent to facility. DC packet on chart. Ambulance transport requested for patient.    RN to call report to [phone]. Pt will go to room 115.Please give pts wife a call if she is not in room at time of pickup.   CSW will sign off for now as social work intervention is no longer needed. Please consult [place] again if new needs arise.   Final next level of care: Skilled Nursing Facility Barriers to Discharge: Barriers Resolved   Patient Goals and CMS Choice Patient states their goals for this hospitalization and ongoing recovery are:: to build strength CMS Medicare.gov Compare Post Acute Care list provided to:: Patient Choice offered to / list presented to : Spouse  Discharge Placement              Patient chooses bed at: [facility] Patient to be transferred to facility by: ptar Name of family member notified: Wife, [name] Patient and family notified of of transfer: 01/26/21  Discharge Plan and Services     Post Acute Care Choice: Durable Medical Equipment                               Social Determinants of Health (SDOH) Interventions     Readmission Risk Interventions No flowsheet data found.   Emeterio Reeve, Latanya Presser, [place] Social Worker [phone]

## 2021-01-27 ENCOUNTER — Non-Acute Institutional Stay (SKILLED_NURSING_FACILITY): Payer: Medicare Other | Admitting: Internal Medicine

## 2021-01-27 ENCOUNTER — Encounter: Payer: Self-pay | Admitting: Internal Medicine

## 2021-01-27 DIAGNOSIS — W19XXXD Unspecified fall, subsequent encounter: Secondary | ICD-10-CM

## 2021-01-27 DIAGNOSIS — E7800 Pure hypercholesterolemia, unspecified: Secondary | ICD-10-CM | POA: Diagnosis not present

## 2021-01-27 DIAGNOSIS — U071 COVID-19: Secondary | ICD-10-CM | POA: Diagnosis not present

## 2021-01-27 DIAGNOSIS — N400 Enlarged prostate without lower urinary tract symptoms: Secondary | ICD-10-CM | POA: Diagnosis not present

## 2021-01-27 DIAGNOSIS — N179 Acute kidney failure, unspecified: Secondary | ICD-10-CM

## 2021-01-27 NOTE — Progress Notes (Signed)
NURSING HOME LOCATION:  [facility]  ROOM NUMBER:  [id number]  CODE STATUS:  DNR  PCP:  [name] MD  This is a comprehensive admission note to this SNFperformed on this date less than 30 days from date of admission. Included are preadmission medical/surgical history; reconciled medication list; family history; social history and comprehensive review of systems.  Corrections and additions to the records were documented. Comprehensive physical exam was also performed. Additionally a clinical summary was entered for each active diagnosis pertinent to this admission in the Problem List to enhance continuity of care.  HPI: He was an inpatient 4/24-4/28/2022,brought to the ER with progressive increasing weakness over several weeks with poor oral intake, orthostatic dizziness and subsequent fall with head trauma but no LOC.  Family states he has had a significant functional decline over the prior year in the context of pre-existing mild cognitive impairment. The patient had been seen in the ED 3/2 with nausea, diaphoresis, lightheadedness, and presyncope.  He was bradycardic with heart rates in the 40s.  Coreg and nebivolol were stopped.  Zio patch was placed for 2 weeks which showed 2 runs of V. Tach. At Cardiology follow-up with Dr. Aundra Dubin  3/21 patient was started on amiodarone. Subsequent to that visit he continued to have progressive weakness with falls prompting the ED visit. In the ED heart rate was in the 50s.  The fall was felt to be related to his bradycardia and possibly iatrogenic in the context of Namenda and galantamine.  Orthostatic dizziness was present.  AKI was present with a creatinine of 2.14; baseline was felt to be 1.2.  This was attributed to poor oral intake and diuretics.  Torsemide 10 mg every other day was held & changed to prn. With this &with IV hydration, creatinine improved . Past history does include atrial bigeminy and nonsustained V. tach.  Because  of the persistent bradycardia amiodarone was decreased to 100 mg daily. He has a history of DVT in 2019 and had been on Eliquis 2.5 mg twice daily.  With increased risk related to progressive weakness and subsequent falls; Eliquis was stopped.  81 mg of aspirin was continued daily. It was clinically suspect that the patient was having cholinergic side effects from Namenda and galantamine possibly causing bradycardia.  These medications were stopped; Wellbutrin was continued and Seroquel 25 mg added at night to improve circadian rhythm. 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  Additionally he was on testosterone injections every 3 weeks. Because of the progressive weakness and falls he was discharged to the SNF for rehab.  Past medical and surgical history: Includes diagnoses of vitamin D deficiency, CAD, history of pituitarymacroadenoma, non-STEMI, chronic LBP without sciatica, ischemic cardiomyopathy, neurocognitive deficit, renal cysts, colon polyps, dyslipidemia, and BPH. Surgeries and procedures include coronary angioplasty with stenting, neurosurgery for pituitary tumor, and hand surgery.  Social history: Currently nondrinker, never smoked.  Family history: Reviewed; noncontributory due to advanced age.   Review of systems: He states that he was in the hospital for "back problem".  There is no associated radiculopathy, paresthesias, or stool/urinary incontinence. He states that he was actually admitted to the hospital yesterday when in reality it was on 4/24.  He gave today's date as "April?, 22'. He states that he awoke 2 days ago and  was disoriented and it took a couple hours and intake of coffee for his thoughts to clear. He also began to tell me about some problem with his big  toe when walking but could not clarify the symptoms.  He denied having gout. He does validate exertional dyspnea. He states that he had COVID last year; he also states that he had a "reaction" to the "booster" in his doctor's office and has had no additional vaccines since.  Constitutional: No fever, significant weight change  Eyes: No redness, discharge, pain, vision change ENT/mouth: No nasal congestion, purulent discharge, earache, change in hearing, sore throat  Cardiovascular: No chest pain, paroxysmal nocturnal dyspnea, claudication, edema  Respiratory: No cough, sputum production, hemoptysis,  significant snoring, apnea  Gastrointestinal: No heartburn, dysphagia, abdominal pain, nausea /vomiting, rectal bleeding, melena, change in bowels Genitourinary: No dysuria, hematuria, pyuria, incontinence, nocturia Dermatologic: No rash, pruritus, change in appearance of skin Neurologic: No headache, syncope, seizures, numbness, tingling Psychiatric: No significant anxiety, depression, insomnia, anorexia Endocrine: No change in hair/skin/nails, excessive thirst, excessive hunger, excessive urination  Hematologic/lymphatic: No significant bruising, lymphadenopathy, abnormal bleeding Allergy/immunology: No itchy/watery eyes, significant sneezing, urticaria, angioedema  Physical exam:  Pertinent or positive findings: He appears younger than his stated age.  Pattern alopecia is present.  Hair is thin over the crown.  He is hard of hearing.  There is facial asymmetry with decreased right nasolabial fold and slight ptosis on the left.  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  Bradycardia is present.  Breath sounds are decreased.  Foley catheter is present.  He has 1+ pedal edema.  Stasis hyperpigmentation is present over the shins.  He has bruising over the dorsum of the hands.  General appearance: Adequately nourished; no acute distress, increased work of breathing is present.    Lymphatic: No lymphadenopathy about the head, neck, axilla. Eyes: No conjunctival inflammation or lid edema is present. There is no scleral icterus. Ears:  External ear exam shows no significant lesions or deformities.   Nose:  External nasal examination shows no deformity or inflammation. Nasal mucosa are pink and moist without lesions, exudates Oral exam: Lips and gums are healthy appearing.There is no oropharyngeal erythema or exudate. Neck:  No thyromegaly, masses, tenderness noted.    Heart:  No gallop, murmur, click, rub.  Lungs: without wheezes, rhonchi, rales, rubs. Abdomen: Bowel sounds are normal.  Abdomen is soft and nontender with no organomegaly, hernias, masses. GU: Deferred  Extremities:  No cyanosis, clubbing Neurologic exam:  Strength equal  in upper & lower extremities. Balance, Rhomberg, finger to nose testing could not be completed due to clinical state Skin: Warm & dry w/o tenting. No significant rash.  See clinical summary under each active problem in the Problem List with associated updated therapeutic plan

## 2021-01-27 NOTE — Assessment & Plan Note (Addendum)
No symptoms or sign of active COVID at this time Unlikely that present neurocognitive issues are related to previous asymptomatic Covid infection O2 sats 100% on RA

## 2021-01-27 NOTE — Assessment & Plan Note (Addendum)
Foley in place Testosterone will be held until discharge from SNF with reassessment by Dr Joylene Draft

## 2021-01-27 NOTE — Assessment & Plan Note (Signed)
PT/OT @ SNF.  D/C Robaxin

## 2021-01-27 NOTE — Patient Instructions (Signed)
See assessment and plan under each diagnosis in the problem list and acutely for this visit 

## 2021-01-27 NOTE — Assessment & Plan Note (Signed)
Diuretic prn progressive edema

## 2021-01-27 NOTE — Assessment & Plan Note (Addendum)
Repatha will be reinitiated post SNF discharge as not on [place] panel

## 2021-01-28 DIAGNOSIS — E785 Hyperlipidemia, unspecified: Secondary | ICD-10-CM | POA: Diagnosis not present

## 2021-01-28 DIAGNOSIS — N1831 Chronic kidney disease, stage 3a: Secondary | ICD-10-CM | POA: Diagnosis not present

## 2021-01-28 DIAGNOSIS — I129 Hypertensive chronic kidney disease with stage 1 through stage 4 chronic kidney disease, or unspecified chronic kidney disease: Secondary | ICD-10-CM | POA: Diagnosis not present

## 2021-01-28 DIAGNOSIS — I2510 Atherosclerotic heart disease of native coronary artery without angina pectoris: Secondary | ICD-10-CM | POA: Diagnosis not present

## 2021-02-02 DIAGNOSIS — R338 Other retention of urine: Secondary | ICD-10-CM | POA: Diagnosis not present

## 2021-02-06 ENCOUNTER — Non-Acute Institutional Stay (SKILLED_NURSING_FACILITY): Payer: Medicare Other | Admitting: Adult Health

## 2021-02-06 ENCOUNTER — Encounter: Payer: Self-pay | Admitting: Adult Health

## 2021-02-06 DIAGNOSIS — R600 Localized edema: Secondary | ICD-10-CM

## 2021-02-06 DIAGNOSIS — N179 Acute kidney failure, unspecified: Secondary | ICD-10-CM

## 2021-02-06 NOTE — Progress Notes (Signed)
Location:  [place] Room Number: [place] of Service:  SNF (31) Provider:  [name], DNP, FNP-BC  Patient Care Team: [name], MD as PCP - General (Internal Medicine) [name], MD as Consulting Physician (Cardiology)  Extended Emergency Contact Information Primary Emergency Contact: Usman,Carolyn Address: [address]          [place], [place] [address] [place] Phone: [phone] Work Phone: [phone] Mobile Phone: [phone] Relation: Spouse  Code Status:   DNR  Goals of care: Advanced Directive information Advanced Directives 01/22/2021  Does Patient Have a Medical Advance Directive? No  Type of Advance Directive -  Does patient want to make changes to medical advance directive? -  Copy of [place] in Chart? -  Would patient like information on creating a medical advance directive? No - Patient declined     Chief Complaint  Patient presents with  . Acute Visit    Short-term Rehab Visit    HPI:  Pt is a 87 y.o. male seen today for medical management of chronic diseases. He was noted to have BLE 1+pitting edema. No noted redness on BLE. No noted SOB. He used to take Torsemide 10 mg every other day at home which is currently on hold due to dehydration. He is currently having short-term rehabilitation at [facility].  He was admitted to [facility] on 01/26/21 post hospitalization 01/22/21 to 01/26/21. He was brought to ED for complaint of progressive weakness for a few weeks, poor oral intake, orthostatic dizziness, leading to a fall, hit his head without loss of consciousness. He was given IV fluids for hydration. Cardiology was consulted due to concerns for persistent bradycardia. It was recommended for Amiodarone to be reduced to 100 mg daily.  Family reported that he has a significant functional decline over the last year.  He does not get around as he used  to and has mild cognitive impairment. Eliquis was stopped due to risk for falls.  Of note, on 11/30/2020 he was brought to the ER for nausea, diaphoresis, lightheadedness and briefly passed out.  He was found to be bradycardic in the 40s.  Coreg and nebivolol were stopped.  Zio patch was placed x2 weeks which showed 2 runs of V. tach.  He followed up with cardiologist, Dr. Aundra Dubin, on 3/21 and was started on amiodarone.    Past Medical History:  Diagnosis Date  . Atrial premature beats   . BPH (benign prostatic hyperplasia)   . Cataract, right eye   . CHF (congestive heart failure) ([place])   . Chronic low back pain   . Colon polyp   . Coronary artery disease   . DDD (degenerative disc disease), lumbar   . Dermatitis 11/16  . Elevated IgE level 06/30/2018  . Hyperlipidemia   . Insomnia   . Kidney cysts   . Memory loss 2015  . Non-ischemic cardiomyopathy ([place])   . NSTEMI (non-ST elevated myocardial infarction) ([place]) 08/25/2014  . Osteoarthritis of right knee   . Pituitary tumor 2011   macroadenoma w/ VF compromise-then gamma knife a few years leater for some recurrence  . Positive radioallergosorbent test (RAST) 06/30/2018  . Status post insertion of drug-eluting stent into left anterior descending (LAD) artery 08/28/15   + nuc study.   . Urinary retention   . Vitamin D deficiency 01/2008   Past Surgical History:  Procedure Laterality Date  . ARTHROSCOPIC SURGERY    .  BACK SURGERY    . CARDIAC CATHETERIZATION  07/27/2015   Dr Shirlee Latch; oLAD 95%, mLAD 50%, CFX stent OK, OM1 60%, OM2 99%, pPLOM 50%, RCA irregular, AM2 70%, EF 55-60%, no regional wall motion abnormalities.     Marland Kitchen CARDIAC CATHETERIZATION N/A 07/28/2015   Procedure: Left Heart Cath and Coronary Angiography;  Surgeon: [name], MD;  Location: [facility] INVASIVE CV LAB;  Service: Cardiovascular;  Laterality: N/A;  . CARDIAC CATHETERIZATION N/A 07/28/2015   Procedure: Coronary Stent Intervention;  Surgeon: Lennette Bihari, MD;   Location: [facility] INVASIVE CV LAB;  Service: Cardiovascular;  Laterality: N/A;  . CATARACT EXTRACTION    . CORONARY ANGIOPLASTY WITH STENT PLACEMENT  07/27/2015   XIENCE ALPINE RX 3.0X33 DES to the LAD, 95%>>0  . HAMMER  BUNION TOE SURGERY    . HAND SURGERY Left   . LEFT HEART CATHETERIZATION WITH CORONARY ANGIOGRAM N/A 08/25/2014   Procedure: LEFT HEART CATHETERIZATION WITH CORONARY ANGIOGRAM;  Surgeon: Kathleene Hazel, MD;  Location: [facility] CATH LAB;  Service: Cardiovascular;  Laterality: N/A;  . PITUITARY SURGERY     12/01/09 Dr. Annalee Genta and Dr. [name].  Marland Kitchen RIGHT/LEFT HEART CATH AND CORONARY ANGIOGRAPHY N/A 03/19/2018   Procedure: RIGHT/LEFT HEART CATH AND CORONARY ANGIOGRAPHY;  Surgeon: [name], MD;  Location: [facility] INVASIVE CV LAB;  Service: Cardiovascular;  Laterality: N/A;  . RIGHT/LEFT HEART CATH AND CORONARY ANGIOGRAPHY N/A 03/13/2019   Procedure: RIGHT/LEFT HEART CATH AND CORONARY ANGIOGRAPHY;  Surgeon: [name], MD;  Location: [facility] INVASIVE CV LAB;  Service: Cardiovascular;  Laterality: N/A;    Allergies  Allergen Reactions  . Sulfa Antibiotics Anaphylaxis, Nausea And Vomiting and Swelling  . Sulfamethoxazole Anaphylaxis, Nausea And Vomiting and Swelling  . Sulfonamide Derivatives Anaphylaxis, Nausea And Vomiting and Swelling  . Bee Venom Rash  . Flonase [Fluticasone] Other (See Comments)    Caused nose bleeds  . Tamsulosin Other (See Comments)    Unknown reaction- Patient doesn't think he is allergic (??)    Outpatient Encounter Medications as of 02/06/2021  Medication Sig  . albuterol (VENTOLIN HFA) 108 (90 Base) MCG/ACT inhaler Inhale 2 puffs into the lungs every 6 (six) hours as needed for wheezing or shortness of breath.  Marland Kitchen amiodarone (PACERONE) 100 MG tablet Take 1 tablet (100 mg total) by mouth daily.  Marland Kitchen ascorbic acid (VITAMIN C) 500 MG tablet Take 1 tablet (500 mg total) by mouth daily.  Marland Kitchen aspirin EC 81 MG tablet Take 1 tablet (81 mg total) by mouth daily.  Marland Kitchen  buPROPion (WELLBUTRIN XL) 150 MG 24 hr tablet Take 150 mg by mouth daily.   . cabergoline (DOSTINEX) 0.5 MG tablet Take 0.5 mg by mouth every Wednesday.  . Evolocumab (REPATHA) 140 MG/ML SOSY Inject 1 mL into the skin every 14 (fourteen) days.   Marland Kitchen ezetimibe (ZETIA) 10 MG tablet TAKE 1 TABLET(10 MG) BY MOUTH DAILY (Patient taking differently: Take 10 mg by mouth daily.)  . fluticasone (FLONASE) 50 MCG/ACT nasal spray Place 2 sprays into both nostrils daily. (Patient taking differently: Place 2 sprays into both nostrils daily as needed for allergies.)  . fluticasone-salmeterol (ADVAIR HFA) 230-21 MCG/ACT inhaler Inhale 2 puffs into the lungs 2 (two) times daily. (Patient taking differently: Inhale 2 puffs into the lungs daily.)  . galantamine (RAZADYNE ER) 8 MG 24 hr capsule Take 8 mg by mouth every morning.  . hydrocortisone (CORTEF) 20 MG tablet Take 20-40 mg by mouth See admin instructions. 40 mg every morning, 20 mg  every evening  . levothyroxine (SYNTHROID, LEVOTHROID) 150 MCG tablet Take 150 mcg by mouth daily before breakfast.  . MYRBETRIQ 50 MG TB24 tablet Take 50 mg by mouth at bedtime.   . nitroGLYCERIN (NITROSTAT) 0.4 MG SL tablet Place 1 tablet (0.4 mg total) under the tongue every 5 (five) minutes as needed for chest pain.  . potassium chloride (KLOR-CON) 10 MEQ tablet Take 2 tablets (20 mEq total) by mouth daily.  . QUEtiapine (SEROQUEL) 25 MG tablet Take 1 tablet (25 mg total) by mouth at bedtime for 5 days.  . ranolazine (RANEXA) 500 MG 12 hr tablet TAKE 1 TABLET(500 MG) BY MOUTH TWICE DAILY (Patient taking differently: Take 500 mg by mouth 2 (two) times daily.)  . senna-docusate (SENOKOT-S) 8.6-50 MG tablet Take 1 tablet by mouth at bedtime as needed for mild constipation.  Marland Kitchen testosterone cypionate (DEPOTESTOTERONE CYPIONATE) 200 MG/ML injection Inject 100 mg into the muscle every 21 ( twenty-one) days.   Marland Kitchen torsemide (DEMADEX) 10 MG tablet Take 1 tablet (10 mg total) by mouth daily as  needed (pedal edema).  . triamcinolone cream (KENALOG) 0.1 % Apply 1 application topically 2 (two) times daily as needed (irritation).   . VASCEPA 1 g capsule TAKE 2 CAPSULES(2 GRAMS) BY MOUTH TWICE DAILY (Patient taking differently: Take 2 g by mouth 2 (two) times daily.)  . [DISCONTINUED] carvedilol (COREG) 12.5 MG tablet Take 1 tablet (12.5 mg total) by mouth 2 (two) times daily with a meal.  . [DISCONTINUED] nebivolol (BYSTOLIC) 2.5 MG tablet TAKE 1/2 TABLET(1.25 MG) BY MOUTH DAILY   Facility-Administered Encounter Medications as of 02/06/2021  Medication  . sodium chloride flush (NS) 0.9 % injection 3 mL    Review of Systems  GENERAL: No fever or chills  MOUTH and THROAT: Denies oral discomfort, gingival pain or bleeding RESPIRATORY: no cough, SOB, DOE, wheezing, hemoptysis CARDIAC: No chest pain or palpitations GI: No abdominal pain, diarrhea, constipation, heart burn, nausea or vomiting GU: Denies dysuria, frequency, hematuria or discharge NEUROLOGICAL: Denies dizziness, syncope, numbness, or headache PSYCHIATRIC: Denies feelings of depression or anxiety. No report of hallucinations, insomnia, paranoia, or agitation    Immunization History  Administered Date(s) Administered  . Fluad Quad(high Dose 65+) 05/26/2019  . Influenza, High Dose Seasonal PF 05/19/2019  . Influenza-Unspecified 05/12/2018  . PFIZER(Purple Top)SARS-COV-2 Vaccination 01/14/2020   Pertinent  Health Maintenance Due  Topic Date Due  . PNA vac Low Risk Adult (1 of 2 - PCV13) Never done  . INFLUENZA VACCINE  05/01/2021   Fall Risk  11/24/2018  Falls in the past year? 1  Number falls in past yr: 0  Injury with Fall? 1     Vitals:   02/06/21 1347  BP: 127/80  Pulse: 74  Resp: 18  Temp: (!) 97.5 F (36.4 C)  Weight: 227 lb 6.4 oz (103.1 kg)  Height: 6\' 3"  (1.905 m)   Body mass index is 28.42 kg/m.  Physical Exam  GENERAL APPEARANCE: Well nourished. In no acute distress. Normal body  habitus SKIN:  Skin is warm and dry. MOUTH and THROAT: Lips are without lesions. Oral mucosa is moist and without lesions.  RESPIRATORY: Breathing is even & unlabored, BS CTAB CARDIAC: RRR, no murmur,no extra heart sounds, BLE1+ edema GI: Abdomen soft, normal BS, no masses, no tenderness NEUROLOGICAL: There is no tremor. Speech is clear.  PSYCHIATRIC:  Affect and behavior are appropriate  Labs reviewed: Recent Labs    01/24/21 0249 01/25/21 0224 01/26/21 0210  NA 136  138 137  K 4.2 3.8 4.2  CL 102 102 104  CO2 24 24 25   GLUCOSE 104* 90 102*  BUN 18 12 13   CREATININE 1.51* 1.28* 1.36*  CALCIUM 9.1 9.4 8.9  MG 2.1  --   --   PHOS 2.7  --   --    Recent Labs    01/22/21 1747  AST 32  ALT 28  ALKPHOS 62  BILITOT 0.9  PROT 5.9*  ALBUMIN 3.3*   Recent Labs    01/24/21 0249 01/25/21 0224 01/26/21 0210  WBC 7.5 10.0 9.9  NEUTROABS 6.0 7.7 7.9*  HGB 13.2 13.4 12.6*  HCT 40.5 41.4 38.6*  MCV 83.2 83.3 82.7  PLT 305 298 276    Lab Results  Component Value Date   CHOL 198 02/24/2019   HDL 43 02/24/2019   LDLCALC 106 (H) 02/24/2019   LDLDIRECT 155.0 11/11/2014   TRIG 243 (H) 02/24/2019   CHOLHDL 4.6 02/24/2019    Significant Diagnostic Results in last 30 days:  DG Chest 1 View  Result Date: 01/22/2021 CLINICAL DATA:  Fall, dizziness EXAM: CHEST  1 VIEW COMPARISON:  Chest radiograph dated 10/13/2019. FINDINGS: The heart size and mediastinal contours are within normal limits. There is mild bibasilar atelectasis. There is no pleural effusion or pneumothorax. The visualized skeletal structures are unremarkable. IMPRESSION: Mild bibasilar atelectasis. Electronically Signed   By: Zerita Boers M.D.   On: 01/22/2021 18:40   DG Pelvis 1-2 Views  Result Date: 01/22/2021 CLINICAL DATA:  Fall with pain. EXAM: PELVIS - 1-2 VIEW COMPARISON:  CT abdomen pelvis dated 12/02/2016. FINDINGS: There is no evidence of pelvic fracture or diastasis. No pelvic bone lesions are seen. Mild  degenerative changes are seen in both hips. Degenerative changes are seen in the lumbar spine. IMPRESSION: No acute osseous injury. Electronically Signed   By: Zerita Boers M.D.   On: 01/22/2021 18:41   CT Head Wo Contrast  Result Date: 01/22/2021 CLINICAL DATA:  Fall this morning with head and neck pain. EXAM: CT HEAD WITHOUT CONTRAST CT CERVICAL SPINE WITHOUT CONTRAST TECHNIQUE: Multidetector CT imaging of the head and cervical spine was performed following the standard protocol without intravenous contrast. Multiplanar CT image reconstructions of the cervical spine were also generated. COMPARISON:  CT head dated 12/02/2016. FINDINGS: CT HEAD FINDINGS Brain: No evidence of acute infarction, hemorrhage, hydrocephalus, extra-axial collection or mass lesion/mass effect. There is mild cerebral volume loss with associated ex vacuo dilatation. Periventricular white matter hypoattenuation likely represents chronic small vessel ischemic disease. 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 Vascular: No hyperdense vessel or unexpected calcification. Skull: Normal. Negative for fracture or focal lesion. Sinuses/Orbits: No acute finding. Other: None. CT CERVICAL SPINE FINDINGS Alignment: Normal. Skull base and vertebrae: No acute fracture. No primary bone lesion or focal pathologic process. Soft tissues and spinal canal: No prevertebral fluid or swelling. No visible canal hematoma. Disc levels:  Severe multilevel degenerative disc and joint disease. Upper chest: Left apical scarring is partially imaged. Other: None. IMPRESSION: 1. No acute intracranial process. 2. No acute osseous injury in the cervical spine. Electronically Signed   By: Zerita Boers M.D.   On: 01/22/2021 18:39   CT Cervical Spine Wo Contrast  Result Date: 01/22/2021 CLINICAL DATA:  Fall this morning with head and neck pain. EXAM: CT HEAD WITHOUT CONTRAST CT CERVICAL SPINE WITHOUT CONTRAST TECHNIQUE: Multidetector CT imaging of the head and cervical  spine was performed following the standard protocol without intravenous contrast. Multiplanar CT  image reconstructions of the cervical spine were also generated. COMPARISON:  CT head dated 12/02/2016. FINDINGS: CT HEAD FINDINGS Brain: No evidence of acute infarction, hemorrhage, hydrocephalus, extra-axial collection or mass lesion/mass effect. There is mild cerebral volume loss with associated ex vacuo dilatation. Periventricular white matter hypoattenuation likely represents chronic small vessel ischemic disease. 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 Vascular: No hyperdense vessel or unexpected calcification. Skull: Normal. Negative for fracture or focal lesion. Sinuses/Orbits: No acute finding. Other: None. CT CERVICAL SPINE FINDINGS Alignment: Normal. Skull base and vertebrae: No acute fracture. No primary bone lesion or focal pathologic process. Soft tissues and spinal canal: No prevertebral fluid or swelling. No visible canal hematoma. Disc levels:  Severe multilevel degenerative disc and joint disease. Upper chest: Left apical scarring is partially imaged. Other: None. IMPRESSION: 1. No acute intracranial process. 2. No acute osseous injury in the cervical spine. Electronically Signed   By: Zerita Boers M.D.   On: 01/22/2021 18:39   CT Lumbar Spine Wo Contrast  Result Date: 01/22/2021 CLINICAL DATA:  Worsening low back pain for greater than 1 month. Fall today. Increased confusion and weakness. EXAM: CT LUMBAR SPINE WITHOUT CONTRAST TECHNIQUE: Multidetector CT imaging of the lumbar spine was performed without intravenous contrast administration. Multiplanar CT image reconstructions were also generated. COMPARISON:  CT abdomen and pelvis 12/02/2016. Lumbar spine MRI 10/30/2007. FINDINGS: Segmentation: 5 lumbar type vertebrae. Alignment: Mild levoscoliosis with apex at L3. No significant listhesis. Vertebrae: Large hemangioma in the L2 vertebral body. Slight chronic anterior wedging of the T12 and L1  vertebral bodies, unchanged from the prior CT. No acute fracture. Remote left L3 transverse process fracture. Diffuse osteopenia. Paraspinal and other soft tissues: Abdominal aortic atherosclerosis without aneurysm. Partially visualized large right lower pole renal cyst. Hiatal hernia. Disc levels: L1-2: Mild disc bulging, endplate spurring, and mild facet arthrosis without evidence of significant stenosis. L2-3: Disc bulging, endplate spurring, and moderate facet hypertrophy result in right greater than left lateral recess stenosis without evidence of significant generalized spinal stenosis or neural foraminal stenosis, similar to the prior MRI. L3-4: Disc bulging, endplate spurring, and moderate facet hypertrophy. Facet hypertrophy has mildly progressed from the prior MRI, however there is no evidence of significant stenosis. L4-5: Disc narrowing and calcification. Disc bulging, endplate spurring, and severe facet hypertrophy have progressed and result in mild spinal stenosis, bilateral lateral recess stenosis, and moderate to severe bilateral neural foraminal stenosis. L5-S1: Prior left laminotomy. Fusion across the disc space. Endplate spurring and severe facet hypertrophy have progressed and result in mild right and moderate left neural foraminal stenosis without spinal stenosis. IMPRESSION: 1. No acute osseous abnormality identified in the lumbar spine. 2. Progressive lumbar disc degeneration and severe facet hypertrophy in the lower lumbar spine compared to a 2009 MRI. 3. Mild spinal stenosis and moderate to severe bilateral neural foraminal stenosis at L4-5. 4. Moderate left neural foraminal stenosis at L5-S1. 5. Aortic Atherosclerosis (ICD10-I70.0). Electronically Signed   By: Logan Bores M.D.   On: 01/22/2021 18:37   DG Knee Complete 4 Views Right  Result Date: 01/22/2021 CLINICAL DATA:  Fall with knee pain EXAM: RIGHT KNEE - COMPLETE 4+ VIEW COMPARISON:  None. FINDINGS: The patient is status post a  knee arthroplasty. No acute osseous injury. No knee joint dislocation or knee joint effusion. The hardware appears intact and well aligned. IMPRESSION: No acute osseous injury. Electronically Signed   By: Zerita Boers M.D.   On: 01/22/2021 18:44   DG Hand Complete Right  Result Date: 01/22/2021 CLINICAL DATA:  Fall with hand pain EXAM: RIGHT HAND - COMPLETE 3+ VIEW COMPARISON:  None. FINDINGS: There is no evidence of fracture or dislocation. Severe degenerative changes are seen at the first carpal metacarpal joint. IMPRESSION: No acute osseous injury. Electronically Signed   By: Zerita Boers M.D.   On: 01/22/2021 18:43   ECHOCARDIOGRAM COMPLETE  Result Date: 01/20/2021    ECHOCARDIOGRAM REPORT   Patient Name:   [name]   Date of Exam: 01/20/2021 Medical Rec #:  [id number]     Height:       73.5 in Accession #:    [id number]    Weight:       235.0 lb Date of Birth:  [birth year]    BSA:          2.316 m Patient Age:    22 years      BP:           140/90 mmHg Patient Gender: M             HR:           64 bpm. Exam Location:  [facility] Procedure: 2D Echo, Cardiac Doppler and Color Doppler Indications:    I47.2 Ventricular tachycardia  History:        Patient has prior history of Echocardiogram examinations, most                 recent 03/17/2018. CHF, Previous Myocardial Infarction,                 Arrythmias:Ventricular tachycardia; Risk Factors:Dyslipidemia.  Sonographer:    Coralyn Helling RDCS Referring Phys: [place] Comments: Technically difficult study due to poor echo windows. IMPRESSIONS  1. Left ventricular ejection fraction, by estimation, is 50 to 55%. The left ventricle has low normal function. The left ventricle demonstrates regional wall motion abnormalities- mild inferolateral hypokinesis (base and mid). Left ventricular diastolic  parameters are consistent with Grade I diastolic dysfunction (impaired relaxation).  2. Aortic dilatation noted. There is borderline  dilatation of the aortic root, measuring 38 mm. There is borderline dilatation of the aortic root and of the ascending aorta, measuring 38 mm.  3. Right ventricular systolic function is normal. The right ventricular size is normal. Mildly increased right ventricular wall thickness.  4. The mitral valve is normal in structure. No evidence of mitral valve regurgitation. No evidence of mitral stenosis.  5. The aortic valve is tricuspid. There is mild thickening of the aortic valve. Aortic valve regurgitation is not visualized. Mild aortic valve sclerosis is present, with no evidence of aortic valve stenosis. Comparison(s): A prior study was performed on 03/14/2018. No significant change from prior study. FINDINGS  Left Ventricle: Left ventricular ejection fraction, by estimation, is 50 to 55%. The left ventricle has low normal function. The left ventricle demonstrates regional wall motion abnormalities. The left ventricular internal cavity size was normal in size. There is no left ventricular hypertrophy. Left ventricular diastolic parameters are consistent with Grade I diastolic dysfunction (impaired relaxation).  LV Wall Scoring: The posterior wall is hypokinetic. Right Ventricle: The right ventricular size is normal. Mildly increased right ventricular wall thickness. Right ventricular systolic function is normal. Left Atrium: Left atrial size was normal in size. Right Atrium: Right atrial size was normal in size. Pericardium: There is no evidence of pericardial effusion. Presence of pericardial fat pad. Mitral Valve: The mitral valve is normal in structure. No evidence of mitral  valve regurgitation. No evidence of mitral valve stenosis. Tricuspid Valve: The tricuspid valve is grossly normal. Tricuspid valve regurgitation is not demonstrated. No evidence of tricuspid stenosis. Aortic Valve: The aortic valve is tricuspid. There is mild thickening of the aortic valve. There is mild aortic valve annular calcification.  Aortic valve regurgitation is not visualized. Mild aortic valve sclerosis is present, with no evidence of aortic valve stenosis. Pulmonic Valve: The pulmonic valve was grossly normal. Pulmonic valve regurgitation is not visualized. No evidence of pulmonic stenosis. Aorta: Aortic dilatation noted. There is borderline dilatation of the aortic root, measuring 38 mm. There is borderline dilatation of the aortic root and of the ascending aorta, measuring 38 mm. IAS/Shunts: The atrial septum is grossly normal.  LEFT VENTRICLE PLAX 2D LVIDd:         4.90 cm  Diastology LVIDs:         3.80 cm  LV e' medial:    3.92 cm/s LV PW:         1.10 cm  LV E/e' medial:  13.9 LV IVS:        1.20 cm  LV e' lateral:   7.47 cm/s LVOT diam:     2.40 cm  LV E/e' lateral: 7.3 LV SV:         74 LV SV Index:   32 LVOT Area:     4.52 cm  RIGHT VENTRICLE             IVC RV S prime:     15.70 cm/s  IVC diam: 2.00 cm TAPSE (M-mode): 2.3 cm LEFT ATRIUM             Index       RIGHT ATRIUM           Index LA diam:        4.40 cm 1.90 cm/m  RA Area:     16.70 cm LA Vol (A2C):   60.7 ml 26.21 ml/m RA Volume:   48.40 ml  20.90 ml/m LA Vol (A4C):   73.8 ml 31.87 ml/m LA Biplane Vol: 70.2 ml 30.31 ml/m  AORTIC VALVE LVOT Vmax:   83.00 cm/s LVOT Vmean:  54.100 cm/s LVOT VTI:    0.163 m  AORTA Ao Root diam: 3.80 cm Ao Asc diam:  3.80 cm MV E velocity: 54.53 cm/s MV A velocity: 84.40 cm/s  SHUNTS MV E/A ratio:  0.65        Systemic VTI:  0.16 m                            Systemic Diam: 2.40 cm Rudean Haskell MD Electronically signed by Rudean Haskell MD Signature Date/Time: 01/20/2021/12:54:10 PM    Final     Assessment/Plan  1. Lower extremity edema -  Will need to have compression stockings, knee high, bilaterally, on in am and off at hs -  continue torsemide 10 mg daily PRN for edema  2. AKI (acute kidney injury) [facility]) Lab Results  Component Value Date   NA 137 01/26/2021   K 4.2 01/26/2021   CO2 25 01/26/2021   GLUCOSE  102 (H) 01/26/2021   BUN 13 01/26/2021   CREATININE 1.36 (H) 01/26/2021   CALCIUM 8.9 01/26/2021   GFRNONAA 51 (L) 01/26/2021   GFRAA 55 (L) 10/15/2019   -  Will re-check BMP     Family/ staff Communication: Discussed plan of care with resident and charge nurse.  Labs/tests  ordered:  BMP  Goals of care:   Short-term care   [name], DNP, MSN, FNP-BC [facility] [phone] (Monday-Friday 8:00 a.m. - 5:00 p.m.) [phone] (after hours)

## 2021-02-07 ENCOUNTER — Non-Acute Institutional Stay (SKILLED_NURSING_FACILITY): Payer: Medicare Other | Admitting: Adult Health

## 2021-02-07 ENCOUNTER — Encounter: Payer: Self-pay | Admitting: Adult Health

## 2021-02-07 DIAGNOSIS — E7800 Pure hypercholesterolemia, unspecified: Secondary | ICD-10-CM

## 2021-02-07 DIAGNOSIS — R531 Weakness: Secondary | ICD-10-CM

## 2021-02-07 DIAGNOSIS — I5032 Chronic diastolic (congestive) heart failure: Secondary | ICD-10-CM | POA: Diagnosis not present

## 2021-02-07 DIAGNOSIS — E039 Hypothyroidism, unspecified: Secondary | ICD-10-CM | POA: Diagnosis not present

## 2021-02-07 DIAGNOSIS — F339 Major depressive disorder, recurrent, unspecified: Secondary | ICD-10-CM

## 2021-02-07 DIAGNOSIS — Z86718 Personal history of other venous thrombosis and embolism: Secondary | ICD-10-CM

## 2021-02-07 DIAGNOSIS — R338 Other retention of urine: Secondary | ICD-10-CM

## 2021-02-07 DIAGNOSIS — N3281 Overactive bladder: Secondary | ICD-10-CM

## 2021-02-07 DIAGNOSIS — N401 Enlarged prostate with lower urinary tract symptoms: Secondary | ICD-10-CM

## 2021-02-07 DIAGNOSIS — E2740 Unspecified adrenocortical insufficiency: Secondary | ICD-10-CM

## 2021-02-07 DIAGNOSIS — F0280 Dementia in other diseases classified elsewhere without behavioral disturbance: Secondary | ICD-10-CM

## 2021-02-07 DIAGNOSIS — G309 Alzheimer's disease, unspecified: Secondary | ICD-10-CM | POA: Diagnosis not present

## 2021-02-07 DIAGNOSIS — I2510 Atherosclerotic heart disease of native coronary artery without angina pectoris: Secondary | ICD-10-CM

## 2021-02-07 DIAGNOSIS — Z8679 Personal history of other diseases of the circulatory system: Secondary | ICD-10-CM | POA: Diagnosis not present

## 2021-02-07 DIAGNOSIS — N179 Acute kidney failure, unspecified: Secondary | ICD-10-CM | POA: Diagnosis not present

## 2021-02-07 NOTE — Progress Notes (Incomplete)
Location:  [facility] Room Number: [place] of Service:  SNF (31) Provider:  [name], DNP, FNP-BC  Patient Care Team: [name], MD as PCP - General (Internal Medicine) [name], MD as Consulting Physician (Cardiology)  Extended Emergency Contact Information Primary Emergency Contact: Fresquez,Carolyn Address: [address]          [place], [place] [address] [place] Phone: [phone] Work Phone: [phone] Mobile Phone: [phone] Relation: Spouse  Code Status:   DNR  Goals of care: Advanced Directive information Advanced Directives 01/22/2021  Does Patient Have a Medical Advance Directive? No  Type of Advance Directive -  Does patient want to make changes to medical advance directive? -  Copy of [name] in Chart? -  Would patient like information on creating a medical advance directive? No - Patient declined     Chief Complaint  Patient presents with  . Discharge Note    For discharge to [facility] on 02/07/21    HPI:  Pt is a 87 y.o. male who is for discharge to [facility] on 02/07/21 with [place] to assist with foley catheter care and [place], OT and ST to assess and treat.  He was admitted to [facility] on 01/26/2021 post hospitalization 01/22/2021 to 01/26/2021.  He was brought to ED for complaint of progressive weakness for a few weeks, poor oral intake, orthostatic dizziness, leading to a fall, hit his head without loss of consciousness.  He presented to the ED with elevated creatinine of 2.14 and was given IV fluids for hydration.  Cardiology was consulted due to concerns for persistent bradycardia.  It was recommended for amiodarone to be reduced to 100 mg daily.  Family reported that he has a significant functional decline over the last year.  He does not get around as he used to and has mild cognitive impairment.   Eliquis was stopped due to risk for falls.  Of note, on 11/30/2020, he was brought to the ER for nausea, diaphoresis, lightheadedness and briefly passed out.  He was found to be bradycardic in the 40s.  Coreg and nebivolol were stopped.  Zio patch was placed x2 weeks which showed 2 runs of V. tach.  He followed up with cardiologist, Dr. Ander Slade on 3/21 and was a started on amiodarone then.  Patient was admitted to this facility for short-term rehabilitation after the patient's recent hospitalization.  Patient has completed SNF rehabilitation and therapy has cleared the patient for discharge.   Past Medical History:  Diagnosis Date  . Atrial premature beats   . BPH (benign prostatic hyperplasia)   . Cataract, right eye   . CHF (congestive heart failure) ([place])   . Chronic low back pain   . Colon polyp   . Coronary artery disease   . DDD (degenerative disc disease), lumbar   . Dermatitis 11/16  . Elevated IgE level 06/30/2018  . Hyperlipidemia   . Insomnia   . Kidney cysts   . Memory loss 2015  . Non-ischemic cardiomyopathy ([place])   . NSTEMI (non-ST elevated myocardial infarction) ([place]) 08/25/2014  . Osteoarthritis of right knee   . Pituitary tumor 2011   macroadenoma w/ VF compromise-then gamma knife a few years leater for some recurrence  . Positive radioallergosorbent test (RAST) 06/30/2018  . Status post insertion of drug-eluting stent into left anterior descending (LAD) artery 08/28/15   + nuc study.   Marland Kitchen  Urinary retention   . Vitamin D deficiency 01/2008   Past Surgical History:  Procedure Laterality Date  . ARTHROSCOPIC SURGERY    . BACK SURGERY    . CARDIAC CATHETERIZATION  07/27/2015   Dr Aundra Dubin; oLAD 95%, mLAD 50%, CFX stent OK, OM1 60%, OM2 99%, pPLOM 50%, RCA irregular, AM2 70%, EF 55-60%, no regional wall motion abnormalities.     Marland Kitchen CARDIAC CATHETERIZATION N/A 07/28/2015   Procedure: Left Heart Cath and Coronary Angiography;  Surgeon: [name], MD;  Location: [facility];  Service: Cardiovascular;  Laterality: N/A;  . CARDIAC CATHETERIZATION N/A 07/28/2015   Procedure: Coronary Stent Intervention;  Surgeon: Troy Sine, MD;  Location: [facility];  Service: Cardiovascular;  Laterality: N/A;  . CATARACT EXTRACTION    . CORONARY ANGIOPLASTY WITH STENT PLACEMENT  07/27/2015   XIENCE ALPINE RX 3.0X33 DES to the LAD, 95%>>0  . HAMMER  BUNION TOE SURGERY    . HAND SURGERY Left   . LEFT HEART CATHETERIZATION WITH CORONARY ANGIOGRAM N/A 08/25/2014   Procedure: LEFT HEART CATHETERIZATION WITH CORONARY ANGIOGRAM;  Surgeon: Burnell Blanks, MD;  Location: [facility] CATH LAB;  Service: Cardiovascular;  Laterality: N/A;  . PITUITARY SURGERY     12/01/09 Dr. Wilburn Cornelia and [name].  Marland Kitchen RIGHT/LEFT HEART CATH AND CORONARY ANGIOGRAPHY N/A 03/19/2018   Procedure: RIGHT/LEFT HEART CATH AND CORONARY ANGIOGRAPHY;  Surgeon: [name], MD;  Location: [facility];  Service: Cardiovascular;  Laterality: N/A;  . RIGHT/LEFT HEART CATH AND CORONARY ANGIOGRAPHY N/A 03/13/2019   Procedure: RIGHT/LEFT HEART CATH AND CORONARY ANGIOGRAPHY;  Surgeon: [name], MD;  Location: [facility];  Service: Cardiovascular;  Laterality: N/A;    Allergies  Allergen Reactions  . Sulfa Antibiotics Anaphylaxis, Nausea And Vomiting and Swelling  . Sulfamethoxazole Anaphylaxis, Nausea And Vomiting and Swelling  . Sulfonamide Derivatives Anaphylaxis, Nausea And Vomiting and Swelling  . Bee Venom Rash  . Flonase [Fluticasone] Other (See Comments)    Caused nose bleeds  . Tamsulosin Other (See Comments)    Unknown reaction- Patient doesn't think he is allergic (??)    Outpatient Encounter Medications as of 02/07/2021  Medication Sig  . albuterol (VENTOLIN HFA) 108 (90 Base) MCG/ACT inhaler Inhale 2 puffs into the lungs every 6 (six) hours as needed for wheezing or shortness of breath.  Marland Kitchen amiodarone (PACERONE) 100 MG tablet Take 1 tablet (100 mg total)  by mouth daily.  Marland Kitchen ascorbic acid (VITAMIN C) 500 MG tablet Take 1 tablet (500 mg total) by mouth daily.  Marland Kitchen aspirin EC 81 MG tablet Take 1 tablet (81 mg total) by mouth daily.  Marland Kitchen buPROPion (WELLBUTRIN XL) 150 MG 24 hr tablet Take 150 mg by mouth daily.   . cabergoline (DOSTINEX) 0.5 MG tablet Take 0.5 mg by mouth every Wednesday.  . Evolocumab (REPATHA) 140 MG/ML SOSY Inject 1 mL into the skin every 14 (fourteen) days.   Marland Kitchen ezetimibe (ZETIA) 10 MG tablet TAKE 1 TABLET(10 MG) BY MOUTH DAILY (Patient taking differently: Take 10 mg by mouth daily.)  . fluticasone (FLONASE) 50 MCG/ACT nasal spray Place 2 sprays into both nostrils daily. (Patient taking differently: Place 2 sprays into both nostrils daily as needed for allergies.)  . fluticasone-salmeterol (ADVAIR HFA) 230-21 MCG/ACT inhaler Inhale 2 puffs into the lungs 2 (two) times daily. (Patient taking differently: Inhale 2 puffs into the lungs daily.)  . galantamine (RAZADYNE ER) 8 MG 24 hr capsule Take 8  mg by mouth every morning.  . hydrocortisone (CORTEF) 20 MG tablet Take 20-40 mg by mouth See admin instructions. 40 mg every morning, 20 mg every evening  . levothyroxine (SYNTHROID, LEVOTHROID) 150 MCG tablet Take 150 mcg by mouth daily before breakfast.  . MYRBETRIQ 50 MG TB24 tablet Take 50 mg by mouth at bedtime.   . nitroGLYCERIN (NITROSTAT) 0.4 MG SL tablet Place 1 tablet (0.4 mg total) under the tongue every 5 (five) minutes as needed for chest pain.  . potassium chloride (KLOR-CON) 10 MEQ tablet Take 2 tablets (20 mEq total) by mouth daily.  . QUEtiapine (SEROQUEL) 25 MG tablet Take 1 tablet (25 mg total) by mouth at bedtime for 5 days.  . ranolazine (RANEXA) 500 MG 12 hr tablet TAKE 1 TABLET(500 MG) BY MOUTH TWICE DAILY (Patient taking differently: Take 500 mg by mouth 2 (two) times daily.)  . senna-docusate (SENOKOT-S) 8.6-50 MG tablet Take 1 tablet by mouth at bedtime as needed for mild constipation.  Marland Kitchen testosterone cypionate  (DEPOTESTOTERONE CYPIONATE) 200 MG/ML injection Inject 100 mg into the muscle every 21 ( twenty-one) days.   Marland Kitchen torsemide (DEMADEX) 10 MG tablet Take 1 tablet (10 mg total) by mouth daily as needed (pedal edema).  . triamcinolone cream (KENALOG) 0.1 % Apply 1 application topically 2 (two) times daily as needed (irritation).   . VASCEPA 1 g capsule TAKE 2 CAPSULES(2 GRAMS) BY MOUTH TWICE DAILY (Patient taking differently: Take 2 g by mouth 2 (two) times daily.)  . [DISCONTINUED] carvedilol (COREG) 12.5 MG tablet Take 1 tablet (12.5 mg total) by mouth 2 (two) times daily with a meal.  . [DISCONTINUED] nebivolol (BYSTOLIC) 2.5 MG tablet TAKE 1/2 TABLET(1.25 MG) BY MOUTH DAILY   Facility-Administered Encounter Medications as of 02/07/2021  Medication  . sodium chloride flush (NS) 0.9 % injection 3 mL    Review of Systems  GENERAL: No fever or chills  MOUTH and THROAT: Denies oral discomfort, gingival pain or bleeding RESPIRATORY: no cough, SOB, DOE, wheezing, hemoptysis CARDIAC: No chest pain or palpitations GI: No abdominal pain, diarrhea, constipation, heart burn, nausea or vomiting GU: Denies dysuria, frequency, hematuria or discharge NEUROLOGICAL: Denies dizziness, syncope, numbness, or headache PSYCHIATRIC: Denies feelings of depression or anxiety. No report of hallucinations, insomnia, paranoia, or agitation   Immunization History  Administered Date(s) Administered  . Fluad Quad(high Dose 65+) 05/26/2019  . Influenza, High Dose Seasonal PF 05/19/2019  . Influenza-Unspecified 05/12/2018  . PFIZER(Purple Top)SARS-COV-2 Vaccination 01/14/2020   Pertinent  Health Maintenance Due  Topic Date Due  . PNA vac Low Risk Adult (2 of 2 - PPSV23) 02/10/2016  . INFLUENZA VACCINE  05/01/2021   Fall Risk  11/24/2018  Falls in the past year? 1  Number falls in past yr: 0  Injury with Fall? 1     Vitals:   02/07/21 1000  BP: 115/71  Pulse: 90  Resp: 20  Temp: (!) 96.7 F (35.9 C)   Weight: 227 lb 6.4 oz (103.1 kg)  Height: 6\' 3"  (1.905 m)   Body mass index is 28.42 kg/m.  Physical Exam  GENERAL APPEARANCE: Well nourished. In no acute distress. Normal body habitus SKIN:  Skin is warm and dry.  MOUTH and THROAT: Lips are without lesions. Oral mucosa is moist and without lesions.  RESPIRATORY: Breathing is even & unlabored, BS CTAB CARDIAC: RRR, no murmur,no extra heart sounds, BLE 1+ edema GI: Abdomen soft, normal BS, no masses, no tenderness GU:  Has foley catheter french 16  NEUROLOGICAL: There is no tremor. Speech is clear. PSYCHIATRIC:  Affect and behavior are appropriate  Labs reviewed: Recent Labs    01/24/21 0249 01/25/21 0224 01/26/21 0210  NA 136 138 137  K 4.2 3.8 4.2  CL 102 102 104  CO2 24 24 25   GLUCOSE 104* 90 102*  BUN 18 12 13   CREATININE 1.51* 1.28* 1.36*  CALCIUM 9.1 9.4 8.9  MG 2.1  --   --   PHOS 2.7  --   --    Recent Labs    01/22/21 1747  AST 32  ALT 28  ALKPHOS 62  BILITOT 0.9  PROT 5.9*  ALBUMIN 3.3*   Recent Labs    01/24/21 0249 01/25/21 0224 01/26/21 0210  WBC 7.5 10.0 9.9  NEUTROABS 6.0 7.7 7.9*  HGB 13.2 13.4 12.6*  HCT 40.5 41.4 38.6*  MCV 83.2 83.3 82.7  PLT 305 298 276   No results found for: TSH No results found for: HGBA1C Lab Results  Component Value Date   CHOL 198 02/24/2019   HDL 43 02/24/2019   LDLCALC 106 (H) 02/24/2019   LDLDIRECT 155.0 11/11/2014   TRIG 243 (H) 02/24/2019   CHOLHDL 4.6 02/24/2019    Significant Diagnostic Results in last 30 days:  DG Chest 1 View  Result Date: 01/22/2021 CLINICAL DATA:  Fall, dizziness EXAM: CHEST  1 VIEW COMPARISON:  Chest radiograph dated 10/13/2019. FINDINGS: The heart size and mediastinal contours are within normal limits. There is mild bibasilar atelectasis. There is no pleural effusion or pneumothorax. The visualized skeletal structures are unremarkable. IMPRESSION: Mild bibasilar atelectasis. Electronically Signed   By: Zerita Boers M.D.    On: 01/22/2021 18:40   DG Pelvis 1-2 Views  Result Date: 01/22/2021 CLINICAL DATA:  Fall with pain. EXAM: PELVIS - 1-2 VIEW COMPARISON:  CT abdomen pelvis dated 12/02/2016. FINDINGS: There is no evidence of pelvic fracture or diastasis. No pelvic bone lesions are seen. Mild degenerative changes are seen in both hips. Degenerative changes are seen in the lumbar spine. IMPRESSION: No acute osseous injury. Electronically Signed   By: Zerita Boers M.D.   On: 01/22/2021 18:41   CT Head Wo Contrast  Result Date: 01/22/2021 CLINICAL DATA:  Fall this morning with head and neck pain. EXAM: CT HEAD WITHOUT CONTRAST CT CERVICAL SPINE WITHOUT CONTRAST TECHNIQUE: Multidetector CT imaging of the head and cervical spine was performed following the standard protocol without intravenous contrast. Multiplanar CT image reconstructions of the cervical spine were also generated. COMPARISON:  CT head dated 12/02/2016. FINDINGS: CT HEAD FINDINGS Brain: No evidence of acute infarction, hemorrhage, hydrocephalus, extra-axial collection or mass lesion/mass effect. There is mild cerebral volume loss with associated ex vacuo dilatation. Periventricular white matter hypoattenuation likely represents chronic small vessel ischemic disease. 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 Vascular: No hyperdense vessel or unexpected calcification. Skull: Normal. Negative for fracture or focal lesion. Sinuses/Orbits: No acute finding. Other: None. CT CERVICAL SPINE FINDINGS Alignment: Normal. Skull base and vertebrae: No acute fracture. No primary bone lesion or focal pathologic process. Soft tissues and spinal canal: No prevertebral fluid or swelling. No visible canal hematoma. Disc levels:  Severe multilevel degenerative disc and joint disease. Upper chest: Left apical scarring is partially imaged. Other: None. IMPRESSION: 1. No acute intracranial process. 2. No acute osseous injury in the cervical spine. Electronically Signed   By: Zerita Boers M.D.   On: 01/22/2021 18:39   CT Cervical Spine Wo Contrast  Result Date: 01/22/2021 CLINICAL  DATA:  Fall this morning with head and neck pain. EXAM: CT HEAD WITHOUT CONTRAST CT CERVICAL SPINE WITHOUT CONTRAST TECHNIQUE: Multidetector CT imaging of the head and cervical spine was performed following the standard protocol without intravenous contrast. Multiplanar CT image reconstructions of the cervical spine were also generated. COMPARISON:  CT head dated 12/02/2016. FINDINGS: CT HEAD FINDINGS Brain: No evidence of acute infarction, hemorrhage, hydrocephalus, extra-axial collection or mass lesion/mass effect. There is mild cerebral volume loss with associated ex vacuo dilatation. Periventricular white matter hypoattenuation likely represents chronic small vessel ischemic disease. 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 Vascular: No hyperdense vessel or unexpected calcification. Skull: Normal. Negative for fracture or focal lesion. Sinuses/Orbits: No acute finding. Other: None. CT CERVICAL SPINE FINDINGS Alignment: Normal. Skull base and vertebrae: No acute fracture. No primary bone lesion or focal pathologic process. Soft tissues and spinal canal: No prevertebral fluid or swelling. No visible canal hematoma. Disc levels:  Severe multilevel degenerative disc and joint disease. Upper chest: Left apical scarring is partially imaged. Other: None. IMPRESSION: 1. No acute intracranial process. 2. No acute osseous injury in the cervical spine. Electronically Signed   By: Zerita Boers M.D.   On: 01/22/2021 18:39   CT Lumbar Spine Wo Contrast  Result Date: 01/22/2021 CLINICAL DATA:  Worsening low back pain for greater than 1 month. Fall today. Increased confusion and weakness. EXAM: CT LUMBAR SPINE WITHOUT CONTRAST TECHNIQUE: Multidetector CT imaging of the lumbar spine was performed without intravenous contrast administration. Multiplanar CT image reconstructions were also generated. COMPARISON:  CT abdomen  and pelvis 12/02/2016. Lumbar spine MRI 10/30/2007. FINDINGS: Segmentation: 5 lumbar type vertebrae. Alignment: Mild levoscoliosis with apex at L3. No significant listhesis. Vertebrae: Large hemangioma in the L2 vertebral body. Slight chronic anterior wedging of the T12 and L1 vertebral bodies, unchanged from the prior CT. No acute fracture. Remote left L3 transverse process fracture. Diffuse osteopenia. Paraspinal and other soft tissues: Abdominal aortic atherosclerosis without aneurysm. Partially visualized large right lower pole renal cyst. Hiatal hernia. Disc levels: L1-2: Mild disc bulging, endplate spurring, and mild facet arthrosis without evidence of significant stenosis. L2-3: Disc bulging, endplate spurring, and moderate facet hypertrophy result in right greater than left lateral recess stenosis without evidence of significant generalized spinal stenosis or neural foraminal stenosis, similar to the prior MRI. L3-4: Disc bulging, endplate spurring, and moderate facet hypertrophy. Facet hypertrophy has mildly progressed from the prior MRI, however there is no evidence of significant stenosis. L4-5: Disc narrowing and calcification. Disc bulging, endplate spurring, and severe facet hypertrophy have progressed and result in mild spinal stenosis, bilateral lateral recess stenosis, and moderate to severe bilateral neural foraminal stenosis. L5-S1: Prior left laminotomy. Fusion across the disc space. Endplate spurring and severe facet hypertrophy have progressed and result in mild right and moderate left neural foraminal stenosis without spinal stenosis. IMPRESSION: 1. No acute osseous abnormality identified in the lumbar spine. 2. Progressive lumbar disc degeneration and severe facet hypertrophy in the lower lumbar spine compared to a 2009 MRI. 3. Mild spinal stenosis and moderate to severe bilateral neural foraminal stenosis at L4-5. 4. Moderate left neural foraminal stenosis at L5-S1. 5. Aortic Atherosclerosis  (ICD10-I70.0). Electronically Signed   By: Logan Bores M.D.   On: 01/22/2021 18:37   DG Knee Complete 4 Views Right  Result Date: 01/22/2021 CLINICAL DATA:  Fall with knee pain EXAM: RIGHT KNEE - COMPLETE 4+ VIEW COMPARISON:  None. FINDINGS: The patient is status post a knee arthroplasty. No acute osseous injury.  No knee joint dislocation or knee joint effusion. The hardware appears intact and well aligned. IMPRESSION: No acute osseous injury. Electronically Signed   By: Zerita Boers M.D.   On: 01/22/2021 18:44   DG Hand Complete Right  Result Date: 01/22/2021 CLINICAL DATA:  Fall with hand pain EXAM: RIGHT HAND - COMPLETE 3+ VIEW COMPARISON:  None. FINDINGS: There is no evidence of fracture or dislocation. Severe degenerative changes are seen at the first carpal metacarpal joint. IMPRESSION: No acute osseous injury. Electronically Signed   By: Zerita Boers M.D.   On: 01/22/2021 18:43   ECHOCARDIOGRAM COMPLETE  Result Date: 01/20/2021    ECHOCARDIOGRAM REPORT   Patient Name:   [name]   Date of Exam: 01/20/2021 Medical Rec #:  [id number]     Height:       73.5 in Accession #:    [id number]    Weight:       235.0 lb Date of Birth:  [birth year]    BSA:          2.316 m Patient Age:    14 years      BP:           140/90 mmHg Patient Gender: M             HR:           64 bpm. Exam Location:  [facility] Procedure: 2D Echo, Cardiac Doppler and Color Doppler Indications:    I47.2 Ventricular tachycardia  History:        Patient has prior history of Echocardiogram examinations, most                 recent 03/17/2018. CHF, Previous Myocardial Infarction,                 Arrythmias:Ventricular tachycardia; Risk Factors:Dyslipidemia.  Sonographer:    Coralyn Helling RDCS Referring Phys: Tangent Comments: Technically difficult study due to poor echo windows. IMPRESSIONS  1. Left ventricular ejection fraction, by estimation, is 50 to 55%. The left ventricle has low normal function.  The left ventricle demonstrates regional wall motion abnormalities- mild inferolateral hypokinesis (base and mid). Left ventricular diastolic  parameters are consistent with Grade I diastolic dysfunction (impaired relaxation).  2. Aortic dilatation noted. There is borderline dilatation of the aortic root, measuring 38 mm. There is borderline dilatation of the aortic root and of the ascending aorta, measuring 38 mm.  3. Right ventricular systolic function is normal. The right ventricular size is normal. Mildly increased right ventricular wall thickness.  4. The mitral valve is normal in structure. No evidence of mitral valve regurgitation. No evidence of mitral stenosis.  5. The aortic valve is tricuspid. There is mild thickening of the aortic valve. Aortic valve regurgitation is not visualized. Mild aortic valve sclerosis is present, with no evidence of aortic valve stenosis. Comparison(s): A prior study was performed on 03/14/2018. No significant change from prior study. FINDINGS  Left Ventricle: Left ventricular ejection fraction, by estimation, is 50 to 55%. The left ventricle has low normal function. The left ventricle demonstrates regional wall motion abnormalities. The left ventricular internal cavity size was normal in size. There is no left ventricular hypertrophy. Left ventricular diastolic parameters are consistent with Grade I diastolic dysfunction (impaired relaxation).  LV Wall Scoring: The posterior wall is hypokinetic. Right Ventricle: The right ventricular size is normal. Mildly increased right ventricular wall thickness. Right ventricular systolic function is normal. Left Atrium: Left  atrial size was normal in size. Right Atrium: Right atrial size was normal in size. Pericardium: There is no evidence of pericardial effusion. Presence of pericardial fat pad. Mitral Valve: The mitral valve is normal in structure. No evidence of mitral valve regurgitation. No evidence of mitral valve stenosis. Tricuspid  Valve: The tricuspid valve is grossly normal. Tricuspid valve regurgitation is not demonstrated. No evidence of tricuspid stenosis. Aortic Valve: The aortic valve is tricuspid. There is mild thickening of the aortic valve. There is mild aortic valve annular calcification. Aortic valve regurgitation is not visualized. Mild aortic valve sclerosis is present, with no evidence of aortic valve stenosis. Pulmonic Valve: The pulmonic valve was grossly normal. Pulmonic valve regurgitation is not visualized. No evidence of pulmonic stenosis. Aorta: Aortic dilatation noted. There is borderline dilatation of the aortic root, measuring 38 mm. There is borderline dilatation of the aortic root and of the ascending aorta, measuring 38 mm. IAS/Shunts: The atrial septum is grossly normal.  LEFT VENTRICLE PLAX 2D LVIDd:         4.90 cm  Diastology LVIDs:         3.80 cm  LV e' medial:    3.92 cm/s LV PW:         1.10 cm  LV E/e' medial:  13.9 LV IVS:        1.20 cm  LV e' lateral:   7.47 cm/s LVOT diam:     2.40 cm  LV E/e' lateral: 7.3 LV SV:         74 LV SV Index:   32 LVOT Area:     4.52 cm  RIGHT VENTRICLE             IVC RV S prime:     15.70 cm/s  IVC diam: 2.00 cm TAPSE (M-mode): 2.3 cm LEFT ATRIUM             Index       RIGHT ATRIUM           Index LA diam:        4.40 cm 1.90 cm/m  RA Area:     16.70 cm LA Vol (A2C):   60.7 ml 26.21 ml/m RA Volume:   48.40 ml  20.90 ml/m LA Vol (A4C):   73.8 ml 31.87 ml/m LA Biplane Vol: 70.2 ml 30.31 ml/m  AORTIC VALVE LVOT Vmax:   83.00 cm/s LVOT Vmean:  54.100 cm/s LVOT VTI:    0.163 m  AORTA Ao Root diam: 3.80 cm Ao Asc diam:  3.80 cm MV E velocity: 54.53 cm/s MV A velocity: 84.40 cm/s  SHUNTS MV E/A ratio:  0.65        Systemic VTI:  0.16 m                            Systemic Diam: 2.40 cm Rudean Haskell MD Electronically signed by Rudean Haskell MD Signature Date/Time: 01/20/2021/12:54:10 PM    Final     Assessment/Plan    Family/ staff Communication:    Labs/tests ordered:    Goals of care:      [name], DNP, MSN, FNP-BC [facility] and Adult Medicine [phone] (Monday-Friday 8:00 a.m. - 5:00 p.m.) [phone] (after hours)

## 2021-02-07 NOTE — Progress Notes (Signed)
Location:  [facility] Room Number: [place] of Service:  SNF (31) Provider:  [name], DNP, FNP-BC  Patient Care Team: [name], MD as PCP - General (Internal Medicine) [name], MD as Consulting Physician (Cardiology)  Extended Emergency Contact Information Primary Emergency Contact: Fresquez,Carolyn Address: [address]          [place], [place] [address] [place] Phone: [phone] Work Phone: [phone] Mobile Phone: [phone] Relation: Spouse  Code Status:   DNR  Goals of care: Advanced Directive information Advanced Directives 01/22/2021  Does Patient Have a Medical Advance Directive? No  Type of Advance Directive -  Does patient want to make changes to medical advance directive? -  Copy of [name] in Chart? -  Would patient like information on creating a medical advance directive? No - Patient declined     Chief Complaint  Patient presents with  . Discharge Note    For discharge to [facility] on 02/07/21    HPI:  Pt is a 87 y.o. male who is for discharge to [facility] on 02/07/21 with [place] to assist with foley catheter care and [place], OT and ST to assess and treat.  He was admitted to [facility] on 01/26/2021 post hospitalization 01/22/2021 to 01/26/2021.  He was brought to ED for complaint of progressive weakness for a few weeks, poor oral intake, orthostatic dizziness, leading to a fall, hit his head without loss of consciousness.  He presented to the ED with elevated creatinine of 2.14 and was given IV fluids for hydration.  Cardiology was consulted due to concerns for persistent bradycardia.  It was recommended for amiodarone to be reduced to 100 mg daily.  Family reported that he has a significant functional decline over the last year.  He does not get around as he used to and has mild cognitive impairment.   Eliquis was stopped due to risk for falls.  Of note, on 11/30/2020, he was brought to the ER for nausea, diaphoresis, lightheadedness and briefly passed out.  He was found to be bradycardic in the 40s.  Coreg and nebivolol were stopped.  Zio patch was placed x2 weeks which showed 2 runs of V. tach.  He followed up with cardiologist, Dr. Ander Slade on 3/21 and was a started on amiodarone then.  Patient was admitted to this facility for short-term rehabilitation after the patient's recent hospitalization.  Patient has completed SNF rehabilitation and therapy has cleared the patient for discharge.   Past Medical History:  Diagnosis Date  . Atrial premature beats   . BPH (benign prostatic hyperplasia)   . Cataract, right eye   . CHF (congestive heart failure) ([place])   . Chronic low back pain   . Colon polyp   . Coronary artery disease   . DDD (degenerative disc disease), lumbar   . Dermatitis 11/16  . Elevated IgE level 06/30/2018  . Hyperlipidemia   . Insomnia   . Kidney cysts   . Memory loss 2015  . Non-ischemic cardiomyopathy ([place])   . NSTEMI (non-ST elevated myocardial infarction) ([place]) 08/25/2014  . Osteoarthritis of right knee   . Pituitary tumor 2011   macroadenoma w/ VF compromise-then gamma knife a few years leater for some recurrence  . Positive radioallergosorbent test (RAST) 06/30/2018  . Status post insertion of drug-eluting stent into left anterior descending (LAD) artery 08/28/15   + nuc study.   Marland Kitchen  Urinary retention   . Vitamin D deficiency 01/2008   Past Surgical History:  Procedure Laterality Date  . ARTHROSCOPIC SURGERY    . BACK SURGERY    . CARDIAC CATHETERIZATION  07/27/2015   Dr Aundra Dubin; oLAD 95%, mLAD 50%, CFX stent OK, OM1 60%, OM2 99%, pPLOM 50%, RCA irregular, AM2 70%, EF 55-60%, no regional wall motion abnormalities.     Marland Kitchen CARDIAC CATHETERIZATION N/A 07/28/2015   Procedure: Left Heart Cath and Coronary Angiography;  Surgeon: [name], MD;  Location: [facility];  Service: Cardiovascular;  Laterality: N/A;  . CARDIAC CATHETERIZATION N/A 07/28/2015   Procedure: Coronary Stent Intervention;  Surgeon: Troy Sine, MD;  Location: [facility];  Service: Cardiovascular;  Laterality: N/A;  . CATARACT EXTRACTION    . CORONARY ANGIOPLASTY WITH STENT PLACEMENT  07/27/2015   XIENCE ALPINE RX 3.0X33 DES to the LAD, 95%>>0  . HAMMER  BUNION TOE SURGERY    . HAND SURGERY Left   . LEFT HEART CATHETERIZATION WITH CORONARY ANGIOGRAM N/A 08/25/2014   Procedure: LEFT HEART CATHETERIZATION WITH CORONARY ANGIOGRAM;  Surgeon: Burnell Blanks, MD;  Location: [facility] CATH LAB;  Service: Cardiovascular;  Laterality: N/A;  . PITUITARY SURGERY     12/01/09 Dr. Wilburn Cornelia and [name].  Marland Kitchen RIGHT/LEFT HEART CATH AND CORONARY ANGIOGRAPHY N/A 03/19/2018   Procedure: RIGHT/LEFT HEART CATH AND CORONARY ANGIOGRAPHY;  Surgeon: [name], MD;  Location: [facility];  Service: Cardiovascular;  Laterality: N/A;  . RIGHT/LEFT HEART CATH AND CORONARY ANGIOGRAPHY N/A 03/13/2019   Procedure: RIGHT/LEFT HEART CATH AND CORONARY ANGIOGRAPHY;  Surgeon: [name], MD;  Location: [facility];  Service: Cardiovascular;  Laterality: N/A;    Allergies  Allergen Reactions  . Sulfa Antibiotics Anaphylaxis, Nausea And Vomiting and Swelling  . Sulfamethoxazole Anaphylaxis, Nausea And Vomiting and Swelling  . Sulfonamide Derivatives Anaphylaxis, Nausea And Vomiting and Swelling  . Bee Venom Rash  . Flonase [Fluticasone] Other (See Comments)    Caused nose bleeds  . Tamsulosin Other (See Comments)    Unknown reaction- Patient doesn't think he is allergic (??)    Outpatient Encounter Medications as of 02/07/2021  Medication Sig  . albuterol (VENTOLIN HFA) 108 (90 Base) MCG/ACT inhaler Inhale 2 puffs into the lungs every 6 (six) hours as needed for wheezing or shortness of breath.  Marland Kitchen amiodarone (PACERONE) 100 MG tablet Take 1 tablet (100 mg total)  by mouth daily.  Marland Kitchen ascorbic acid (VITAMIN C) 500 MG tablet Take 1 tablet (500 mg total) by mouth daily.  Marland Kitchen aspirin EC 81 MG tablet Take 1 tablet (81 mg total) by mouth daily.  Marland Kitchen buPROPion (WELLBUTRIN XL) 150 MG 24 hr tablet Take 150 mg by mouth daily.   . cabergoline (DOSTINEX) 0.5 MG tablet Take 0.5 mg by mouth every Wednesday.  . Evolocumab (REPATHA) 140 MG/ML SOSY Inject 1 mL into the skin every 14 (fourteen) days.   Marland Kitchen ezetimibe (ZETIA) 10 MG tablet TAKE 1 TABLET(10 MG) BY MOUTH DAILY (Patient taking differently: Take 10 mg by mouth daily.)  . fluticasone (FLONASE) 50 MCG/ACT nasal spray Place 2 sprays into both nostrils daily. (Patient taking differently: Place 2 sprays into both nostrils daily as needed for allergies.)  . fluticasone-salmeterol (ADVAIR HFA) 230-21 MCG/ACT inhaler Inhale 2 puffs into the lungs 2 (two) times daily. (Patient taking differently: Inhale 2 puffs into the lungs daily.)  . galantamine (RAZADYNE ER) 8 MG 24 hr capsule Take 8  mg by mouth every morning.  . hydrocortisone (CORTEF) 20 MG tablet Take 20-40 mg by mouth See admin instructions. 40 mg every morning, 20 mg every evening  . levothyroxine (SYNTHROID, LEVOTHROID) 150 MCG tablet Take 150 mcg by mouth daily before breakfast.  . MYRBETRIQ 50 MG TB24 tablet Take 50 mg by mouth at bedtime.   . nitroGLYCERIN (NITROSTAT) 0.4 MG SL tablet Place 1 tablet (0.4 mg total) under the tongue every 5 (five) minutes as needed for chest pain.  . potassium chloride (KLOR-CON) 10 MEQ tablet Take 2 tablets (20 mEq total) by mouth daily.  . QUEtiapine (SEROQUEL) 25 MG tablet Take 1 tablet (25 mg total) by mouth at bedtime for 5 days.  . ranolazine (RANEXA) 500 MG 12 hr tablet TAKE 1 TABLET(500 MG) BY MOUTH TWICE DAILY (Patient taking differently: Take 500 mg by mouth 2 (two) times daily.)  . senna-docusate (SENOKOT-S) 8.6-50 MG tablet Take 1 tablet by mouth at bedtime as needed for mild constipation.  Marland Kitchen testosterone cypionate  (DEPOTESTOTERONE CYPIONATE) 200 MG/ML injection Inject 100 mg into the muscle every 21 ( twenty-one) days.   Marland Kitchen torsemide (DEMADEX) 10 MG tablet Take 1 tablet (10 mg total) by mouth daily as needed (pedal edema).  . triamcinolone cream (KENALOG) 0.1 % Apply 1 application topically 2 (two) times daily as needed (irritation).   . VASCEPA 1 g capsule TAKE 2 CAPSULES(2 GRAMS) BY MOUTH TWICE DAILY (Patient taking differently: Take 2 g by mouth 2 (two) times daily.)  . [DISCONTINUED] carvedilol (COREG) 12.5 MG tablet Take 1 tablet (12.5 mg total) by mouth 2 (two) times daily with a meal.  . [DISCONTINUED] nebivolol (BYSTOLIC) 2.5 MG tablet TAKE 1/2 TABLET(1.25 MG) BY MOUTH DAILY   Facility-Administered Encounter Medications as of 02/07/2021  Medication  . sodium chloride flush (NS) 0.9 % injection 3 mL    Review of Systems  GENERAL: No fever or chills  MOUTH and THROAT: Denies oral discomfort, gingival pain or bleeding RESPIRATORY: no cough, SOB, DOE, wheezing, hemoptysis CARDIAC: No chest pain or palpitations GI: No abdominal pain, diarrhea, constipation, heart burn, nausea or vomiting GU: Denies dysuria, frequency, hematuria or discharge NEUROLOGICAL: Denies dizziness, syncope, numbness, or headache PSYCHIATRIC: Denies feelings of depression or anxiety. No report of hallucinations, insomnia, paranoia, or agitation   Immunization History  Administered Date(s) Administered  . Fluad Quad(high Dose 65+) 05/26/2019  . Influenza, High Dose Seasonal PF 05/19/2019  . Influenza-Unspecified 05/12/2018  . PFIZER(Purple Top)SARS-COV-2 Vaccination 01/14/2020   Pertinent  Health Maintenance Due  Topic Date Due  . PNA vac Low Risk Adult (2 of 2 - PPSV23) 02/10/2016  . INFLUENZA VACCINE  05/01/2021   Fall Risk  11/24/2018  Falls in the past year? 1  Number falls in past yr: 0  Injury with Fall? 1     Vitals:   02/07/21 1000  BP: 115/71  Pulse: 90  Resp: 20  Temp: (!) 96.7 F (35.9 C)   Weight: 227 lb 6.4 oz (103.1 kg)  Height: 6\' 3"  (1.905 m)   Body mass index is 28.42 kg/m.  Physical Exam  GENERAL APPEARANCE: Well nourished. In no acute distress. Normal body habitus SKIN:  Skin is warm and dry.  MOUTH and THROAT: Lips are without lesions. Oral mucosa is moist and without lesions.  RESPIRATORY: Breathing is even & unlabored, BS CTAB CARDIAC: RRR, no murmur,no extra heart sounds, BLE 1+ edema GI: Abdomen soft, normal BS, no masses, no tenderness GU:  Has foley catheter french 16  NEUROLOGICAL: There is no tremor. Speech is clear. PSYCHIATRIC:  Affect and behavior are appropriate  Labs reviewed: Recent Labs    01/24/21 0249 01/25/21 0224 01/26/21 0210  NA 136 138 137  K 4.2 3.8 4.2  CL 102 102 104  CO2 24 24 25   GLUCOSE 104* 90 102*  BUN 18 12 13   CREATININE 1.51* 1.28* 1.36*  CALCIUM 9.1 9.4 8.9  MG 2.1  --   --   PHOS 2.7  --   --    Recent Labs    01/22/21 1747  AST 32  ALT 28  ALKPHOS 62  BILITOT 0.9  PROT 5.9*  ALBUMIN 3.3*   Recent Labs    01/24/21 0249 01/25/21 0224 01/26/21 0210  WBC 7.5 10.0 9.9  NEUTROABS 6.0 7.7 7.9*  HGB 13.2 13.4 12.6*  HCT 40.5 41.4 38.6*  MCV 83.2 83.3 82.7  PLT 305 298 276   No results found for: TSH No results found for: HGBA1C Lab Results  Component Value Date   CHOL 198 02/24/2019   HDL 43 02/24/2019   LDLCALC 106 (H) 02/24/2019   LDLDIRECT 155.0 11/11/2014   TRIG 243 (H) 02/24/2019   CHOLHDL 4.6 02/24/2019    Significant Diagnostic Results in last 30 days:  DG Chest 1 View  Result Date: 01/22/2021 CLINICAL DATA:  Fall, dizziness EXAM: CHEST  1 VIEW COMPARISON:  Chest radiograph dated 10/13/2019. FINDINGS: The heart size and mediastinal contours are within normal limits. There is mild bibasilar atelectasis. There is no pleural effusion or pneumothorax. The visualized skeletal structures are unremarkable. IMPRESSION: Mild bibasilar atelectasis. Electronically Signed   By: Zerita Boers M.D.    On: 01/22/2021 18:40   DG Pelvis 1-2 Views  Result Date: 01/22/2021 CLINICAL DATA:  Fall with pain. EXAM: PELVIS - 1-2 VIEW COMPARISON:  CT abdomen pelvis dated 12/02/2016. FINDINGS: There is no evidence of pelvic fracture or diastasis. No pelvic bone lesions are seen. Mild degenerative changes are seen in both hips. Degenerative changes are seen in the lumbar spine. IMPRESSION: No acute osseous injury. Electronically Signed   By: Zerita Boers M.D.   On: 01/22/2021 18:41   CT Head Wo Contrast  Result Date: 01/22/2021 CLINICAL DATA:  Fall this morning with head and neck pain. EXAM: CT HEAD WITHOUT CONTRAST CT CERVICAL SPINE WITHOUT CONTRAST TECHNIQUE: Multidetector CT imaging of the head and cervical spine was performed following the standard protocol without intravenous contrast. Multiplanar CT image reconstructions of the cervical spine were also generated. COMPARISON:  CT head dated 12/02/2016. FINDINGS: CT HEAD FINDINGS Brain: No evidence of acute infarction, hemorrhage, hydrocephalus, extra-axial collection or mass lesion/mass effect. There is mild cerebral volume loss with associated ex vacuo dilatation. Periventricular white matter hypoattenuation likely represents chronic small vessel ischemic disease. 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 Vascular: No hyperdense vessel or unexpected calcification. Skull: Normal. Negative for fracture or focal lesion. Sinuses/Orbits: No acute finding. Other: None. CT CERVICAL SPINE FINDINGS Alignment: Normal. Skull base and vertebrae: No acute fracture. No primary bone lesion or focal pathologic process. Soft tissues and spinal canal: No prevertebral fluid or swelling. No visible canal hematoma. Disc levels:  Severe multilevel degenerative disc and joint disease. Upper chest: Left apical scarring is partially imaged. Other: None. IMPRESSION: 1. No acute intracranial process. 2. No acute osseous injury in the cervical spine. Electronically Signed   By: Zerita Boers M.D.   On: 01/22/2021 18:39   CT Cervical Spine Wo Contrast  Result Date: 01/22/2021 CLINICAL  DATA:  Fall this morning with head and neck pain. EXAM: CT HEAD WITHOUT CONTRAST CT CERVICAL SPINE WITHOUT CONTRAST TECHNIQUE: Multidetector CT imaging of the head and cervical spine was performed following the standard protocol without intravenous contrast. Multiplanar CT image reconstructions of the cervical spine were also generated. COMPARISON:  CT head dated 12/02/2016. FINDINGS: CT HEAD FINDINGS Brain: No evidence of acute infarction, hemorrhage, hydrocephalus, extra-axial collection or mass lesion/mass effect. There is mild cerebral volume loss with associated ex vacuo dilatation. Periventricular white matter hypoattenuation likely represents chronic small vessel ischemic disease. 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 Vascular: No hyperdense vessel or unexpected calcification. Skull: Normal. Negative for fracture or focal lesion. Sinuses/Orbits: No acute finding. Other: None. CT CERVICAL SPINE FINDINGS Alignment: Normal. Skull base and vertebrae: No acute fracture. No primary bone lesion or focal pathologic process. Soft tissues and spinal canal: No prevertebral fluid or swelling. No visible canal hematoma. Disc levels:  Severe multilevel degenerative disc and joint disease. Upper chest: Left apical scarring is partially imaged. Other: None. IMPRESSION: 1. No acute intracranial process. 2. No acute osseous injury in the cervical spine. Electronically Signed   By: Zerita Boers M.D.   On: 01/22/2021 18:39   CT Lumbar Spine Wo Contrast  Result Date: 01/22/2021 CLINICAL DATA:  Worsening low back pain for greater than 1 month. Fall today. Increased confusion and weakness. EXAM: CT LUMBAR SPINE WITHOUT CONTRAST TECHNIQUE: Multidetector CT imaging of the lumbar spine was performed without intravenous contrast administration. Multiplanar CT image reconstructions were also generated. COMPARISON:  CT abdomen  and pelvis 12/02/2016. Lumbar spine MRI 10/30/2007. FINDINGS: Segmentation: 5 lumbar type vertebrae. Alignment: Mild levoscoliosis with apex at L3. No significant listhesis. Vertebrae: Large hemangioma in the L2 vertebral body. Slight chronic anterior wedging of the T12 and L1 vertebral bodies, unchanged from the prior CT. No acute fracture. Remote left L3 transverse process fracture. Diffuse osteopenia. Paraspinal and other soft tissues: Abdominal aortic atherosclerosis without aneurysm. Partially visualized large right lower pole renal cyst. Hiatal hernia. Disc levels: L1-2: Mild disc bulging, endplate spurring, and mild facet arthrosis without evidence of significant stenosis. L2-3: Disc bulging, endplate spurring, and moderate facet hypertrophy result in right greater than left lateral recess stenosis without evidence of significant generalized spinal stenosis or neural foraminal stenosis, similar to the prior MRI. L3-4: Disc bulging, endplate spurring, and moderate facet hypertrophy. Facet hypertrophy has mildly progressed from the prior MRI, however there is no evidence of significant stenosis. L4-5: Disc narrowing and calcification. Disc bulging, endplate spurring, and severe facet hypertrophy have progressed and result in mild spinal stenosis, bilateral lateral recess stenosis, and moderate to severe bilateral neural foraminal stenosis. L5-S1: Prior left laminotomy. Fusion across the disc space. Endplate spurring and severe facet hypertrophy have progressed and result in mild right and moderate left neural foraminal stenosis without spinal stenosis. IMPRESSION: 1. No acute osseous abnormality identified in the lumbar spine. 2. Progressive lumbar disc degeneration and severe facet hypertrophy in the lower lumbar spine compared to a 2009 MRI. 3. Mild spinal stenosis and moderate to severe bilateral neural foraminal stenosis at L4-5. 4. Moderate left neural foraminal stenosis at L5-S1. 5. Aortic Atherosclerosis  (ICD10-I70.0). Electronically Signed   By: Logan Bores M.D.   On: 01/22/2021 18:37   DG Knee Complete 4 Views Right  Result Date: 01/22/2021 CLINICAL DATA:  Fall with knee pain EXAM: RIGHT KNEE - COMPLETE 4+ VIEW COMPARISON:  None. FINDINGS: The patient is status post a knee arthroplasty. No acute osseous injury.  No knee joint dislocation or knee joint effusion. The hardware appears intact and well aligned. IMPRESSION: No acute osseous injury. Electronically Signed   By: Zerita Boers M.D.   On: 01/22/2021 18:44   DG Hand Complete Right  Result Date: 01/22/2021 CLINICAL DATA:  Fall with hand pain EXAM: RIGHT HAND - COMPLETE 3+ VIEW COMPARISON:  None. FINDINGS: There is no evidence of fracture or dislocation. Severe degenerative changes are seen at the first carpal metacarpal joint. IMPRESSION: No acute osseous injury. Electronically Signed   By: Zerita Boers M.D.   On: 01/22/2021 18:43   ECHOCARDIOGRAM COMPLETE  Result Date: 01/20/2021    ECHOCARDIOGRAM REPORT   Patient Name:   [name]   Date of Exam: 01/20/2021 Medical Rec #:  [id number]     Height:       73.5 in Accession #:    [id number]    Weight:       235.0 lb Date of Birth:  [birth year]    BSA:          2.316 m Patient Age:    73 years      BP:           140/90 mmHg Patient Gender: M             HR:           64 bpm. Exam Location:  [facility] Procedure: 2D Echo, Cardiac Doppler and Color Doppler Indications:    I47.2 Ventricular tachycardia  History:        Patient has prior history of Echocardiogram examinations, most                 recent 03/17/2018. CHF, Previous Myocardial Infarction,                 Arrythmias:Ventricular tachycardia; Risk Factors:Dyslipidemia.  Sonographer:    Coralyn Helling RDCS Referring Phys: [place] Comments: Technically difficult study due to poor echo windows. IMPRESSIONS  1. Left ventricular ejection fraction, by estimation, is 50 to 55%. The left ventricle has low normal function.  The left ventricle demonstrates regional wall motion abnormalities- mild inferolateral hypokinesis (base and mid). Left ventricular diastolic  parameters are consistent with Grade I diastolic dysfunction (impaired relaxation).  2. Aortic dilatation noted. There is borderline dilatation of the aortic root, measuring 38 mm. There is borderline dilatation of the aortic root and of the ascending aorta, measuring 38 mm.  3. Right ventricular systolic function is normal. The right ventricular size is normal. Mildly increased right ventricular wall thickness.  4. The mitral valve is normal in structure. No evidence of mitral valve regurgitation. No evidence of mitral stenosis.  5. The aortic valve is tricuspid. There is mild thickening of the aortic valve. Aortic valve regurgitation is not visualized. Mild aortic valve sclerosis is present, with no evidence of aortic valve stenosis. Comparison(s): A prior study was performed on 03/14/2018. No significant change from prior study. FINDINGS  Left Ventricle: Left ventricular ejection fraction, by estimation, is 50 to 55%. The left ventricle has low normal function. The left ventricle demonstrates regional wall motion abnormalities. The left ventricular internal cavity size was normal in size. There is no left ventricular hypertrophy. Left ventricular diastolic parameters are consistent with Grade I diastolic dysfunction (impaired relaxation).  LV Wall Scoring: The posterior wall is hypokinetic. Right Ventricle: The right ventricular size is normal. Mildly increased right ventricular wall thickness. Right ventricular systolic function is normal. Left Atrium: Left  atrial size was normal in size. Right Atrium: Right atrial size was normal in size. Pericardium: There is no evidence of pericardial effusion. Presence of pericardial fat pad. Mitral Valve: The mitral valve is normal in structure. No evidence of mitral valve regurgitation. No evidence of mitral valve stenosis. Tricuspid  Valve: The tricuspid valve is grossly normal. Tricuspid valve regurgitation is not demonstrated. No evidence of tricuspid stenosis. Aortic Valve: The aortic valve is tricuspid. There is mild thickening of the aortic valve. There is mild aortic valve annular calcification. Aortic valve regurgitation is not visualized. Mild aortic valve sclerosis is present, with no evidence of aortic valve stenosis. Pulmonic Valve: The pulmonic valve was grossly normal. Pulmonic valve regurgitation is not visualized. No evidence of pulmonic stenosis. Aorta: Aortic dilatation noted. There is borderline dilatation of the aortic root, measuring 38 mm. There is borderline dilatation of the aortic root and of the ascending aorta, measuring 38 mm. IAS/Shunts: The atrial septum is grossly normal.  LEFT VENTRICLE PLAX 2D LVIDd:         4.90 cm  Diastology LVIDs:         3.80 cm  LV e' medial:    3.92 cm/s LV PW:         1.10 cm  LV E/e' medial:  13.9 LV IVS:        1.20 cm  LV e' lateral:   7.47 cm/s LVOT diam:     2.40 cm  LV E/e' lateral: 7.3 LV SV:         74 LV SV Index:   32 LVOT Area:     4.52 cm  RIGHT VENTRICLE             IVC RV S prime:     15.70 cm/s  IVC diam: 2.00 cm TAPSE (M-mode): 2.3 cm LEFT ATRIUM             Index       RIGHT ATRIUM           Index LA diam:        4.40 cm 1.90 cm/m  RA Area:     16.70 cm LA Vol (A2C):   60.7 ml 26.21 ml/m RA Volume:   48.40 ml  20.90 ml/m LA Vol (A4C):   73.8 ml 31.87 ml/m LA Biplane Vol: 70.2 ml 30.31 ml/m  AORTIC VALVE LVOT Vmax:   83.00 cm/s LVOT Vmean:  54.100 cm/s LVOT VTI:    0.163 m  AORTA Ao Root diam: 3.80 cm Ao Asc diam:  3.80 cm MV E velocity: 54.53 cm/s MV A velocity: 84.40 cm/s  SHUNTS MV E/A ratio:  0.65        Systemic VTI:  0.16 m                            Systemic Diam: 2.40 cm Rudean Haskell MD Electronically signed by Rudean Haskell MD Signature Date/Time: 01/20/2021/12:54:10 PM    Final     Assessment/Plan  1. Generalized weakness -  For home  health PT, OT and ST, for therapeutic strengthening exercises  2. Urinary retention due to benign prostatic hyperplasia -  For [facility] home health RN to assist with catheter care -   Follow-up with [facility] - finasteride (PROSCAR) 5 MG tablet; Take 1 tablet (5 mg total) by mouth daily.  Dispense: 30 tablet; Refill: 0  3. AKI (acute kidney injury) ([place]) -  GFR 45.30 and creatinine  1.41, 02/07/21  4. HYPERCHOLESTEROLEMIA  IIA - icosapent Ethyl (VASCEPA) 1 g capsule; TAKE 2 CAPSULES(2 GRAMS) BY MOUTH TWICE DAILY  Dispense: 120 capsule; Refill: 0 - ezetimibe (ZETIA) 10 MG tablet; Take 1 tablet (10 mg total) by mouth daily.  Dispense: 30 tablet; Refill: 0  5. Hypothyroidism, unspecified type - levothyroxine (SYNTHROID) 150 MCG tablet; Take 1 tablet (150 mcg total) by mouth daily before breakfast.  Dispense: 30 tablet; Refill: 0  6. History of ventricular tachycardia - ranolazine (RANEXA) 500 MG 12 hr tablet; Take 1 tablet (500 mg total) by mouth 2 (two) times daily.  Dispense: 60 tablet; Refill: 0 - amiodarone (PACERONE) 100 MG tablet; Take 1 tablet (100 mg total) by mouth daily.  Dispense: 30 tablet; Refill: 0  7. Chronic diastolic heart failure (HCC) - albuterol (VENTOLIN HFA) 108 (90 Base) MCG/ACT inhaler; Inhale 2 puffs into the lungs every 6 (six) hours as needed for wheezing or shortness of breath.  Dispense: 24 g; Refill: 0 - torsemide (DEMADEX) 10 MG tablet; Take 1 tablet (10 mg total) by mouth daily as needed (pedal edema).  Dispense: 30 tablet; Refill: 0  8. History of DVT (deep vein thrombosis) -  Eliquis was discontinued due to risk for falls - aspirin EC 81 MG tablet; Take 1 tablet (81 mg total) by mouth daily.  Dispense: 30 tablet; Refill: 0  9. Overactive bladder  MYRBETRIQ 50 MG TB24 tablet; Take 1 tablet (50 mg total) by mouth at bedtime.  Dispense: 30 tablet; Refill: 0  10. Coronary artery disease involving native coronary artery of native heart without angina  pectoris - nitroGLYCERIN (NITROSTAT) 0.4 MG SL tablet; Place 1 tablet (0.4 mg total) under the tongue every 5 (five) minutes as needed for chest pain.  Dispense: 30 tablet; Refill: 0 - aspirin EC 81 MG tablet; Take 1 tablet (81 mg total) by mouth daily.  Dispense: 30 tablet; Refill: 0  11. Adrenal insufficiency (HCC) -History of pituitary adenoma S/P resection with resultant hypothyroidism and adrenal insufficiency - cabergoline (DOSTINEX) 0.5 MG tablet; Take 1 tablet (0.5 mg total) by mouth every Wednesday.  Dispense: 5 tablet; Refill: 0 - hydrocortisone (CORTEF) 20 MG tablet; Take 1-2 tablets (20-40 mg total) by mouth See admin instructions. 40 mg every morning, 20 mg every evening  Dispense: 90 tablet; Refill: 0  12. Alzheimer's dementia without behavioral disturbance, unspecified timing of dementia onset ([place]) - galantamine (RAZADYNE ER) 8 MG 24 hr capsule; Take 1 capsule (8 mg total) by mouth every morning.  Dispense: 30 capsule; Refill: 0  13. Major depression, recurrent, chronic (HCC) - buPROPion (WELLBUTRIN XL) 150 MG 24 hr tablet; Take 1 tablet (150 mg total) by mouth daily.  Dispense: 30 tablet; Refill: 0      I have filled out patient's discharge paperwork and e-prescribed medications.  Patient will have [facility] home health RN to assist with foley catheter care and [facility] home health PT, OT and ST to assess and treat.  DME provided:  Sports administrator  -patient suffers from generalized weakness which impairs his ability to perform daily activities like toileting, dressing, grooming and bathing in the home.  A cane or walker will not resolve issue with performing activities of daily living.  A wheelchair will allow patient to safely perform daily activities.  Patient can safely propel the wheelchair in the home.   Total discharge time: Greater than 30 minutes Greater then 50% was spent in counseling and coordination of care.    Discharge  time involved  coordination of the discharge process with Education officer, museum, nursing staff and therapy department. Medical justification for home health services/DME verified.   [name], DNP, MSN, FNP-BC [facility] [phone] (Monday-Friday 8:00 a.m. - 5:00 p.m.) [phone] (after hours)

## 2021-02-08 ENCOUNTER — Other Ambulatory Visit: Payer: Self-pay | Admitting: Adult Health

## 2021-02-08 DIAGNOSIS — E559 Vitamin D deficiency, unspecified: Secondary | ICD-10-CM | POA: Diagnosis not present

## 2021-02-08 DIAGNOSIS — N401 Enlarged prostate with lower urinary tract symptoms: Secondary | ICD-10-CM | POA: Diagnosis not present

## 2021-02-08 DIAGNOSIS — M1711 Unilateral primary osteoarthritis, right knee: Secondary | ICD-10-CM | POA: Diagnosis not present

## 2021-02-08 DIAGNOSIS — I5032 Chronic diastolic (congestive) heart failure: Secondary | ICD-10-CM

## 2021-02-08 DIAGNOSIS — G4700 Insomnia, unspecified: Secondary | ICD-10-CM | POA: Diagnosis not present

## 2021-02-08 DIAGNOSIS — M5136 Other intervertebral disc degeneration, lumbar region: Secondary | ICD-10-CM | POA: Diagnosis not present

## 2021-02-08 DIAGNOSIS — Z9181 History of falling: Secondary | ICD-10-CM | POA: Diagnosis not present

## 2021-02-08 DIAGNOSIS — M545 Low back pain, unspecified: Secondary | ICD-10-CM | POA: Diagnosis not present

## 2021-02-08 DIAGNOSIS — R338 Other retention of urine: Secondary | ICD-10-CM | POA: Diagnosis not present

## 2021-02-08 DIAGNOSIS — N179 Acute kidney failure, unspecified: Secondary | ICD-10-CM | POA: Diagnosis not present

## 2021-02-08 DIAGNOSIS — F0390 Unspecified dementia without behavioral disturbance: Secondary | ICD-10-CM | POA: Diagnosis not present

## 2021-02-08 DIAGNOSIS — I2510 Atherosclerotic heart disease of native coronary artery without angina pectoris: Secondary | ICD-10-CM | POA: Diagnosis not present

## 2021-02-08 DIAGNOSIS — I252 Old myocardial infarction: Secondary | ICD-10-CM | POA: Diagnosis not present

## 2021-02-08 DIAGNOSIS — I509 Heart failure, unspecified: Secondary | ICD-10-CM | POA: Diagnosis not present

## 2021-02-08 DIAGNOSIS — G8929 Other chronic pain: Secondary | ICD-10-CM | POA: Diagnosis not present

## 2021-02-08 DIAGNOSIS — E785 Hyperlipidemia, unspecified: Secondary | ICD-10-CM | POA: Diagnosis not present

## 2021-02-08 DIAGNOSIS — Z86010 Personal history of colonic polyps: Secondary | ICD-10-CM | POA: Diagnosis not present

## 2021-02-08 DIAGNOSIS — Z466 Encounter for fitting and adjustment of urinary device: Secondary | ICD-10-CM | POA: Diagnosis not present

## 2021-02-08 DIAGNOSIS — I110 Hypertensive heart disease with heart failure: Secondary | ICD-10-CM | POA: Diagnosis not present

## 2021-02-08 DIAGNOSIS — I428 Other cardiomyopathies: Secondary | ICD-10-CM | POA: Diagnosis not present

## 2021-02-08 DIAGNOSIS — Z8616 Personal history of COVID-19: Secondary | ICD-10-CM | POA: Diagnosis not present

## 2021-02-08 DIAGNOSIS — J45909 Unspecified asthma, uncomplicated: Secondary | ICD-10-CM | POA: Diagnosis not present

## 2021-02-08 MED ORDER — AMIODARONE HCL 100 MG PO TABS: 100.0000 mg | ORAL_TABLET | Freq: Every day | ORAL | 0 refills | Status: AC

## 2021-02-08 MED ORDER — ADVAIR HFA 230-21 MCG/ACT IN AERO: 2.0000 | INHALATION_SPRAY | Freq: Two times a day (BID) | RESPIRATORY_TRACT | 0 refills | Status: AC

## 2021-02-08 MED ORDER — ICOSAPENT ETHYL 1 G PO CAPS: ORAL_CAPSULE | ORAL | 0 refills | Status: AC

## 2021-02-08 MED ORDER — ASPIRIN EC 81 MG PO TBEC: 81.0000 mg | DELAYED_RELEASE_TABLET | Freq: Every day | ORAL | 0 refills | Status: AC

## 2021-02-08 MED ORDER — FLUTICASONE PROPIONATE 50 MCG/ACT NA SUSP: 2.0000 | Freq: Every day | NASAL | 5 refills | Status: AC

## 2021-02-08 MED ORDER — BUPROPION HCL ER (XL) 150 MG PO TB24: 150.0000 mg | ORAL_TABLET | Freq: Every day | ORAL | 0 refills | Status: AC

## 2021-02-08 MED ORDER — ALBUTEROL SULFATE HFA 108 (90 BASE) MCG/ACT IN AERS: 2.0000 | INHALATION_SPRAY | Freq: Four times a day (QID) | RESPIRATORY_TRACT | 0 refills | Status: AC | PRN

## 2021-02-08 MED ORDER — GALANTAMINE HYDROBROMIDE ER 8 MG PO CP24: 8.0000 mg | ORAL_CAPSULE | Freq: Every morning | ORAL | 0 refills | Status: AC

## 2021-02-08 MED ORDER — MYRBETRIQ 50 MG PO TB24: 50.0000 mg | ORAL_TABLET | Freq: Every day | ORAL | 0 refills | Status: AC

## 2021-02-08 MED ORDER — HYDROCORTISONE 20 MG PO TABS: 20.0000 mg | ORAL_TABLET | ORAL | 0 refills | Status: AC

## 2021-02-08 MED ORDER — POTASSIUM CHLORIDE ER 10 MEQ PO TBCR: 20.0000 meq | EXTENDED_RELEASE_TABLET | Freq: Every day | ORAL | 0 refills | Status: AC

## 2021-02-08 MED ORDER — CABERGOLINE 0.5 MG PO TABS: 0.5000 mg | ORAL_TABLET | ORAL | 0 refills | Status: AC

## 2021-02-08 MED ORDER — RANOLAZINE ER 500 MG PO TB12: 500.0000 mg | ORAL_TABLET | Freq: Two times a day (BID) | ORAL | 0 refills | Status: AC

## 2021-02-08 MED ORDER — LEVOTHYROXINE SODIUM 150 MCG PO TABS: 150.0000 ug | ORAL_TABLET | Freq: Every day | ORAL | 0 refills | Status: AC

## 2021-02-08 MED ORDER — FINASTERIDE 5 MG PO TABS: 5.0000 mg | ORAL_TABLET | Freq: Every day | ORAL | 0 refills | Status: AC

## 2021-02-08 MED ORDER — NITROGLYCERIN 0.4 MG SL SUBL: 0.4000 mg | SUBLINGUAL_TABLET | SUBLINGUAL | 0 refills | Status: AC | PRN

## 2021-02-08 MED ORDER — EZETIMIBE 10 MG PO TABS: 10.0000 mg | ORAL_TABLET | Freq: Every day | ORAL | 0 refills | Status: AC

## 2021-02-08 MED ORDER — TORSEMIDE 10 MG PO TABS: 10.0000 mg | ORAL_TABLET | Freq: Every day | ORAL | 0 refills | Status: AC | PRN

## 2021-02-10 DIAGNOSIS — N401 Enlarged prostate with lower urinary tract symptoms: Secondary | ICD-10-CM | POA: Diagnosis not present

## 2021-02-10 DIAGNOSIS — N179 Acute kidney failure, unspecified: Secondary | ICD-10-CM | POA: Diagnosis not present

## 2021-02-10 DIAGNOSIS — I509 Heart failure, unspecified: Secondary | ICD-10-CM | POA: Diagnosis not present

## 2021-02-10 DIAGNOSIS — I110 Hypertensive heart disease with heart failure: Secondary | ICD-10-CM | POA: Diagnosis not present

## 2021-02-10 DIAGNOSIS — F0390 Unspecified dementia without behavioral disturbance: Secondary | ICD-10-CM | POA: Diagnosis not present

## 2021-02-10 DIAGNOSIS — R338 Other retention of urine: Secondary | ICD-10-CM | POA: Diagnosis not present

## 2021-02-13 DIAGNOSIS — I110 Hypertensive heart disease with heart failure: Secondary | ICD-10-CM | POA: Diagnosis not present

## 2021-02-13 DIAGNOSIS — N3281 Overactive bladder: Secondary | ICD-10-CM | POA: Diagnosis not present

## 2021-02-13 DIAGNOSIS — I2510 Atherosclerotic heart disease of native coronary artery without angina pectoris: Secondary | ICD-10-CM | POA: Diagnosis not present

## 2021-02-13 DIAGNOSIS — F0281 Dementia in other diseases classified elsewhere with behavioral disturbance: Secondary | ICD-10-CM | POA: Diagnosis not present

## 2021-02-13 DIAGNOSIS — I252 Old myocardial infarction: Secondary | ICD-10-CM | POA: Diagnosis not present

## 2021-02-13 DIAGNOSIS — F0390 Unspecified dementia without behavioral disturbance: Secondary | ICD-10-CM | POA: Diagnosis not present

## 2021-02-13 DIAGNOSIS — E039 Hypothyroidism, unspecified: Secondary | ICD-10-CM | POA: Diagnosis not present

## 2021-02-13 DIAGNOSIS — F331 Major depressive disorder, recurrent, moderate: Secondary | ICD-10-CM | POA: Diagnosis not present

## 2021-02-13 DIAGNOSIS — N401 Enlarged prostate with lower urinary tract symptoms: Secondary | ICD-10-CM | POA: Diagnosis not present

## 2021-02-13 DIAGNOSIS — Z79899 Other long term (current) drug therapy: Secondary | ICD-10-CM | POA: Diagnosis not present

## 2021-02-13 DIAGNOSIS — I509 Heart failure, unspecified: Secondary | ICD-10-CM | POA: Diagnosis not present

## 2021-02-13 DIAGNOSIS — E2740 Unspecified adrenocortical insufficiency: Secondary | ICD-10-CM | POA: Diagnosis not present

## 2021-02-13 DIAGNOSIS — N179 Acute kidney failure, unspecified: Secondary | ICD-10-CM | POA: Diagnosis not present

## 2021-02-13 DIAGNOSIS — G309 Alzheimer's disease, unspecified: Secondary | ICD-10-CM | POA: Diagnosis not present

## 2021-02-13 DIAGNOSIS — R338 Other retention of urine: Secondary | ICD-10-CM | POA: Diagnosis not present

## 2021-02-14 DIAGNOSIS — I110 Hypertensive heart disease with heart failure: Secondary | ICD-10-CM | POA: Diagnosis not present

## 2021-02-14 DIAGNOSIS — R338 Other retention of urine: Secondary | ICD-10-CM | POA: Diagnosis not present

## 2021-02-14 DIAGNOSIS — F0390 Unspecified dementia without behavioral disturbance: Secondary | ICD-10-CM | POA: Diagnosis not present

## 2021-02-14 DIAGNOSIS — N401 Enlarged prostate with lower urinary tract symptoms: Secondary | ICD-10-CM | POA: Diagnosis not present

## 2021-02-14 DIAGNOSIS — I509 Heart failure, unspecified: Secondary | ICD-10-CM | POA: Diagnosis not present

## 2021-02-14 DIAGNOSIS — N179 Acute kidney failure, unspecified: Secondary | ICD-10-CM | POA: Diagnosis not present

## 2021-02-15 DIAGNOSIS — N179 Acute kidney failure, unspecified: Secondary | ICD-10-CM | POA: Diagnosis not present

## 2021-02-15 DIAGNOSIS — I509 Heart failure, unspecified: Secondary | ICD-10-CM | POA: Diagnosis not present

## 2021-02-15 DIAGNOSIS — R338 Other retention of urine: Secondary | ICD-10-CM | POA: Diagnosis not present

## 2021-02-15 DIAGNOSIS — F0390 Unspecified dementia without behavioral disturbance: Secondary | ICD-10-CM | POA: Diagnosis not present

## 2021-02-15 DIAGNOSIS — I110 Hypertensive heart disease with heart failure: Secondary | ICD-10-CM | POA: Diagnosis not present

## 2021-02-15 DIAGNOSIS — N401 Enlarged prostate with lower urinary tract symptoms: Secondary | ICD-10-CM | POA: Diagnosis not present

## 2021-02-16 DIAGNOSIS — H353131 Nonexudative age-related macular degeneration, bilateral, early dry stage: Secondary | ICD-10-CM | POA: Diagnosis not present

## 2021-02-16 DIAGNOSIS — H4720 Unspecified optic atrophy: Secondary | ICD-10-CM | POA: Diagnosis not present

## 2021-02-16 DIAGNOSIS — N401 Enlarged prostate with lower urinary tract symptoms: Secondary | ICD-10-CM | POA: Diagnosis not present

## 2021-02-16 DIAGNOSIS — E039 Hypothyroidism, unspecified: Secondary | ICD-10-CM | POA: Diagnosis not present

## 2021-02-16 DIAGNOSIS — D649 Anemia, unspecified: Secondary | ICD-10-CM | POA: Diagnosis not present

## 2021-02-16 DIAGNOSIS — I82401 Acute embolism and thrombosis of unspecified deep veins of right lower extremity: Secondary | ICD-10-CM | POA: Diagnosis not present

## 2021-02-16 DIAGNOSIS — R338 Other retention of urine: Secondary | ICD-10-CM | POA: Diagnosis not present

## 2021-02-16 DIAGNOSIS — I129 Hypertensive chronic kidney disease with stage 1 through stage 4 chronic kidney disease, or unspecified chronic kidney disease: Secondary | ICD-10-CM | POA: Diagnosis not present

## 2021-02-16 DIAGNOSIS — I110 Hypertensive heart disease with heart failure: Secondary | ICD-10-CM | POA: Diagnosis not present

## 2021-02-16 DIAGNOSIS — I87319 Chronic venous hypertension (idiopathic) with ulcer of unspecified lower extremity: Secondary | ICD-10-CM | POA: Diagnosis not present

## 2021-02-16 DIAGNOSIS — E230 Hypopituitarism: Secondary | ICD-10-CM | POA: Diagnosis not present

## 2021-02-16 DIAGNOSIS — N179 Acute kidney failure, unspecified: Secondary | ICD-10-CM | POA: Diagnosis not present

## 2021-02-16 DIAGNOSIS — N1831 Chronic kidney disease, stage 3a: Secondary | ICD-10-CM | POA: Diagnosis not present

## 2021-02-16 DIAGNOSIS — I509 Heart failure, unspecified: Secondary | ICD-10-CM | POA: Diagnosis not present

## 2021-02-16 DIAGNOSIS — F0390 Unspecified dementia without behavioral disturbance: Secondary | ICD-10-CM | POA: Diagnosis not present

## 2021-02-16 DIAGNOSIS — E785 Hyperlipidemia, unspecified: Secondary | ICD-10-CM | POA: Diagnosis not present

## 2021-02-16 DIAGNOSIS — I2510 Atherosclerotic heart disease of native coronary artery without angina pectoris: Secondary | ICD-10-CM | POA: Diagnosis not present

## 2021-02-16 DIAGNOSIS — E871 Hypo-osmolality and hyponatremia: Secondary | ICD-10-CM | POA: Diagnosis not present

## 2021-02-16 DIAGNOSIS — E2740 Unspecified adrenocortical insufficiency: Secondary | ICD-10-CM | POA: Diagnosis not present

## 2021-02-21 ENCOUNTER — Other Ambulatory Visit (HOSPITAL_COMMUNITY): Payer: Medicare Other

## 2021-02-21 ENCOUNTER — Encounter (HOSPITAL_COMMUNITY): Payer: Medicare Other | Admitting: Cardiology

## 2021-02-21 DIAGNOSIS — I110 Hypertensive heart disease with heart failure: Secondary | ICD-10-CM | POA: Diagnosis not present

## 2021-02-21 DIAGNOSIS — N401 Enlarged prostate with lower urinary tract symptoms: Secondary | ICD-10-CM | POA: Diagnosis not present

## 2021-02-21 DIAGNOSIS — F0390 Unspecified dementia without behavioral disturbance: Secondary | ICD-10-CM | POA: Diagnosis not present

## 2021-02-21 DIAGNOSIS — N179 Acute kidney failure, unspecified: Secondary | ICD-10-CM | POA: Diagnosis not present

## 2021-02-21 DIAGNOSIS — I509 Heart failure, unspecified: Secondary | ICD-10-CM | POA: Diagnosis not present

## 2021-02-21 DIAGNOSIS — R338 Other retention of urine: Secondary | ICD-10-CM | POA: Diagnosis not present

## 2021-02-22 DIAGNOSIS — N179 Acute kidney failure, unspecified: Secondary | ICD-10-CM | POA: Diagnosis not present

## 2021-02-22 DIAGNOSIS — I509 Heart failure, unspecified: Secondary | ICD-10-CM | POA: Diagnosis not present

## 2021-02-22 DIAGNOSIS — R338 Other retention of urine: Secondary | ICD-10-CM | POA: Diagnosis not present

## 2021-02-22 DIAGNOSIS — I110 Hypertensive heart disease with heart failure: Secondary | ICD-10-CM | POA: Diagnosis not present

## 2021-02-22 DIAGNOSIS — N401 Enlarged prostate with lower urinary tract symptoms: Secondary | ICD-10-CM | POA: Diagnosis not present

## 2021-02-22 DIAGNOSIS — F0390 Unspecified dementia without behavioral disturbance: Secondary | ICD-10-CM | POA: Diagnosis not present

## 2021-02-23 DIAGNOSIS — N401 Enlarged prostate with lower urinary tract symptoms: Secondary | ICD-10-CM | POA: Diagnosis not present

## 2021-02-23 DIAGNOSIS — R338 Other retention of urine: Secondary | ICD-10-CM | POA: Diagnosis not present

## 2021-02-23 DIAGNOSIS — I110 Hypertensive heart disease with heart failure: Secondary | ICD-10-CM | POA: Diagnosis not present

## 2021-02-23 DIAGNOSIS — N179 Acute kidney failure, unspecified: Secondary | ICD-10-CM | POA: Diagnosis not present

## 2021-02-23 DIAGNOSIS — F0390 Unspecified dementia without behavioral disturbance: Secondary | ICD-10-CM | POA: Diagnosis not present

## 2021-02-23 DIAGNOSIS — I509 Heart failure, unspecified: Secondary | ICD-10-CM | POA: Diagnosis not present

## 2021-02-24 DIAGNOSIS — R338 Other retention of urine: Secondary | ICD-10-CM | POA: Diagnosis not present

## 2021-02-24 DIAGNOSIS — F0390 Unspecified dementia without behavioral disturbance: Secondary | ICD-10-CM | POA: Diagnosis not present

## 2021-02-24 DIAGNOSIS — I509 Heart failure, unspecified: Secondary | ICD-10-CM | POA: Diagnosis not present

## 2021-02-24 DIAGNOSIS — I110 Hypertensive heart disease with heart failure: Secondary | ICD-10-CM | POA: Diagnosis not present

## 2021-02-24 DIAGNOSIS — N401 Enlarged prostate with lower urinary tract symptoms: Secondary | ICD-10-CM | POA: Diagnosis not present

## 2021-02-24 DIAGNOSIS — N179 Acute kidney failure, unspecified: Secondary | ICD-10-CM | POA: Diagnosis not present

## 2021-02-27 DIAGNOSIS — N401 Enlarged prostate with lower urinary tract symptoms: Secondary | ICD-10-CM | POA: Diagnosis not present

## 2021-02-27 DIAGNOSIS — F0390 Unspecified dementia without behavioral disturbance: Secondary | ICD-10-CM | POA: Diagnosis not present

## 2021-02-27 DIAGNOSIS — I509 Heart failure, unspecified: Secondary | ICD-10-CM | POA: Diagnosis not present

## 2021-02-27 DIAGNOSIS — I110 Hypertensive heart disease with heart failure: Secondary | ICD-10-CM | POA: Diagnosis not present

## 2021-02-27 DIAGNOSIS — N179 Acute kidney failure, unspecified: Secondary | ICD-10-CM | POA: Diagnosis not present

## 2021-02-27 DIAGNOSIS — R338 Other retention of urine: Secondary | ICD-10-CM | POA: Diagnosis not present

## 2021-02-28 DIAGNOSIS — N179 Acute kidney failure, unspecified: Secondary | ICD-10-CM | POA: Diagnosis not present

## 2021-02-28 DIAGNOSIS — N401 Enlarged prostate with lower urinary tract symptoms: Secondary | ICD-10-CM | POA: Diagnosis not present

## 2021-02-28 DIAGNOSIS — I129 Hypertensive chronic kidney disease with stage 1 through stage 4 chronic kidney disease, or unspecified chronic kidney disease: Secondary | ICD-10-CM | POA: Diagnosis not present

## 2021-02-28 DIAGNOSIS — N1831 Chronic kidney disease, stage 3a: Secondary | ICD-10-CM | POA: Diagnosis not present

## 2021-02-28 DIAGNOSIS — R338 Other retention of urine: Secondary | ICD-10-CM | POA: Diagnosis not present

## 2021-02-28 DIAGNOSIS — I2510 Atherosclerotic heart disease of native coronary artery without angina pectoris: Secondary | ICD-10-CM | POA: Diagnosis not present

## 2021-02-28 DIAGNOSIS — I509 Heart failure, unspecified: Secondary | ICD-10-CM | POA: Diagnosis not present

## 2021-02-28 DIAGNOSIS — F0390 Unspecified dementia without behavioral disturbance: Secondary | ICD-10-CM | POA: Diagnosis not present

## 2021-02-28 DIAGNOSIS — I110 Hypertensive heart disease with heart failure: Secondary | ICD-10-CM | POA: Diagnosis not present

## 2021-02-28 DIAGNOSIS — E785 Hyperlipidemia, unspecified: Secondary | ICD-10-CM | POA: Diagnosis not present

## 2021-03-01 DIAGNOSIS — R338 Other retention of urine: Secondary | ICD-10-CM | POA: Diagnosis not present

## 2021-03-01 DIAGNOSIS — I509 Heart failure, unspecified: Secondary | ICD-10-CM | POA: Diagnosis not present

## 2021-03-01 DIAGNOSIS — I110 Hypertensive heart disease with heart failure: Secondary | ICD-10-CM | POA: Diagnosis not present

## 2021-03-01 DIAGNOSIS — N401 Enlarged prostate with lower urinary tract symptoms: Secondary | ICD-10-CM | POA: Diagnosis not present

## 2021-03-01 DIAGNOSIS — F0390 Unspecified dementia without behavioral disturbance: Secondary | ICD-10-CM | POA: Diagnosis not present

## 2021-03-01 DIAGNOSIS — N179 Acute kidney failure, unspecified: Secondary | ICD-10-CM | POA: Diagnosis not present

## 2021-03-02 DIAGNOSIS — N179 Acute kidney failure, unspecified: Secondary | ICD-10-CM | POA: Diagnosis not present

## 2021-03-02 DIAGNOSIS — I110 Hypertensive heart disease with heart failure: Secondary | ICD-10-CM | POA: Diagnosis not present

## 2021-03-02 DIAGNOSIS — R338 Other retention of urine: Secondary | ICD-10-CM | POA: Diagnosis not present

## 2021-03-02 DIAGNOSIS — N401 Enlarged prostate with lower urinary tract symptoms: Secondary | ICD-10-CM | POA: Diagnosis not present

## 2021-03-02 DIAGNOSIS — F0390 Unspecified dementia without behavioral disturbance: Secondary | ICD-10-CM | POA: Diagnosis not present

## 2021-03-02 DIAGNOSIS — I509 Heart failure, unspecified: Secondary | ICD-10-CM | POA: Diagnosis not present

## 2021-03-03 DIAGNOSIS — R338 Other retention of urine: Secondary | ICD-10-CM | POA: Diagnosis not present

## 2021-03-03 DIAGNOSIS — F0390 Unspecified dementia without behavioral disturbance: Secondary | ICD-10-CM | POA: Diagnosis not present

## 2021-03-03 DIAGNOSIS — I509 Heart failure, unspecified: Secondary | ICD-10-CM | POA: Diagnosis not present

## 2021-03-03 DIAGNOSIS — I110 Hypertensive heart disease with heart failure: Secondary | ICD-10-CM | POA: Diagnosis not present

## 2021-03-03 DIAGNOSIS — N179 Acute kidney failure, unspecified: Secondary | ICD-10-CM | POA: Diagnosis not present

## 2021-03-03 DIAGNOSIS — N401 Enlarged prostate with lower urinary tract symptoms: Secondary | ICD-10-CM | POA: Diagnosis not present

## 2021-03-06 DIAGNOSIS — I82401 Acute embolism and thrombosis of unspecified deep veins of right lower extremity: Secondary | ICD-10-CM | POA: Diagnosis not present

## 2021-03-06 DIAGNOSIS — D352 Benign neoplasm of pituitary gland: Secondary | ICD-10-CM | POA: Diagnosis not present

## 2021-03-06 DIAGNOSIS — I87319 Chronic venous hypertension (idiopathic) with ulcer of unspecified lower extremity: Secondary | ICD-10-CM | POA: Diagnosis not present

## 2021-03-06 DIAGNOSIS — N1831 Chronic kidney disease, stage 3a: Secondary | ICD-10-CM | POA: Diagnosis not present

## 2021-03-06 DIAGNOSIS — E230 Hypopituitarism: Secondary | ICD-10-CM | POA: Diagnosis not present

## 2021-03-06 DIAGNOSIS — D649 Anemia, unspecified: Secondary | ICD-10-CM | POA: Diagnosis not present

## 2021-03-06 DIAGNOSIS — F0390 Unspecified dementia without behavioral disturbance: Secondary | ICD-10-CM | POA: Diagnosis not present

## 2021-03-06 DIAGNOSIS — I951 Orthostatic hypotension: Secondary | ICD-10-CM | POA: Diagnosis not present

## 2021-03-06 DIAGNOSIS — I129 Hypertensive chronic kidney disease with stage 1 through stage 4 chronic kidney disease, or unspecified chronic kidney disease: Secondary | ICD-10-CM | POA: Diagnosis not present

## 2021-03-06 DIAGNOSIS — E2740 Unspecified adrenocortical insufficiency: Secondary | ICD-10-CM | POA: Diagnosis not present

## 2021-03-06 DIAGNOSIS — I255 Ischemic cardiomyopathy: Secondary | ICD-10-CM | POA: Diagnosis not present

## 2021-03-06 DIAGNOSIS — I2510 Atherosclerotic heart disease of native coronary artery without angina pectoris: Secondary | ICD-10-CM | POA: Diagnosis not present

## 2021-03-07 DIAGNOSIS — I110 Hypertensive heart disease with heart failure: Secondary | ICD-10-CM | POA: Diagnosis not present

## 2021-03-07 DIAGNOSIS — F0390 Unspecified dementia without behavioral disturbance: Secondary | ICD-10-CM | POA: Diagnosis not present

## 2021-03-07 DIAGNOSIS — N401 Enlarged prostate with lower urinary tract symptoms: Secondary | ICD-10-CM | POA: Diagnosis not present

## 2021-03-07 DIAGNOSIS — R338 Other retention of urine: Secondary | ICD-10-CM | POA: Diagnosis not present

## 2021-03-07 DIAGNOSIS — N179 Acute kidney failure, unspecified: Secondary | ICD-10-CM | POA: Diagnosis not present

## 2021-03-07 DIAGNOSIS — I509 Heart failure, unspecified: Secondary | ICD-10-CM | POA: Diagnosis not present

## 2021-03-08 DIAGNOSIS — N179 Acute kidney failure, unspecified: Secondary | ICD-10-CM | POA: Diagnosis not present

## 2021-03-08 DIAGNOSIS — R338 Other retention of urine: Secondary | ICD-10-CM | POA: Diagnosis not present

## 2021-03-08 DIAGNOSIS — F0390 Unspecified dementia without behavioral disturbance: Secondary | ICD-10-CM | POA: Diagnosis not present

## 2021-03-08 DIAGNOSIS — I509 Heart failure, unspecified: Secondary | ICD-10-CM | POA: Diagnosis not present

## 2021-03-08 DIAGNOSIS — I110 Hypertensive heart disease with heart failure: Secondary | ICD-10-CM | POA: Diagnosis not present

## 2021-03-08 DIAGNOSIS — N401 Enlarged prostate with lower urinary tract symptoms: Secondary | ICD-10-CM | POA: Diagnosis not present

## 2021-03-09 DIAGNOSIS — I110 Hypertensive heart disease with heart failure: Secondary | ICD-10-CM | POA: Diagnosis not present

## 2021-03-09 DIAGNOSIS — R338 Other retention of urine: Secondary | ICD-10-CM | POA: Diagnosis not present

## 2021-03-09 DIAGNOSIS — F0390 Unspecified dementia without behavioral disturbance: Secondary | ICD-10-CM | POA: Diagnosis not present

## 2021-03-09 DIAGNOSIS — N179 Acute kidney failure, unspecified: Secondary | ICD-10-CM | POA: Diagnosis not present

## 2021-03-09 DIAGNOSIS — I509 Heart failure, unspecified: Secondary | ICD-10-CM | POA: Diagnosis not present

## 2021-03-09 DIAGNOSIS — N401 Enlarged prostate with lower urinary tract symptoms: Secondary | ICD-10-CM | POA: Diagnosis not present

## 2021-03-10 DIAGNOSIS — M1711 Unilateral primary osteoarthritis, right knee: Secondary | ICD-10-CM | POA: Diagnosis not present

## 2021-03-10 DIAGNOSIS — I428 Other cardiomyopathies: Secondary | ICD-10-CM | POA: Diagnosis not present

## 2021-03-10 DIAGNOSIS — G4700 Insomnia, unspecified: Secondary | ICD-10-CM | POA: Diagnosis not present

## 2021-03-10 DIAGNOSIS — E785 Hyperlipidemia, unspecified: Secondary | ICD-10-CM | POA: Diagnosis not present

## 2021-03-10 DIAGNOSIS — N179 Acute kidney failure, unspecified: Secondary | ICD-10-CM | POA: Diagnosis not present

## 2021-03-10 DIAGNOSIS — Z8616 Personal history of COVID-19: Secondary | ICD-10-CM | POA: Diagnosis not present

## 2021-03-10 DIAGNOSIS — G8929 Other chronic pain: Secondary | ICD-10-CM | POA: Diagnosis not present

## 2021-03-10 DIAGNOSIS — I2510 Atherosclerotic heart disease of native coronary artery without angina pectoris: Secondary | ICD-10-CM | POA: Diagnosis not present

## 2021-03-10 DIAGNOSIS — I110 Hypertensive heart disease with heart failure: Secondary | ICD-10-CM | POA: Diagnosis not present

## 2021-03-10 DIAGNOSIS — F0390 Unspecified dementia without behavioral disturbance: Secondary | ICD-10-CM | POA: Diagnosis not present

## 2021-03-10 DIAGNOSIS — J45909 Unspecified asthma, uncomplicated: Secondary | ICD-10-CM | POA: Diagnosis not present

## 2021-03-10 DIAGNOSIS — M545 Low back pain, unspecified: Secondary | ICD-10-CM | POA: Diagnosis not present

## 2021-03-10 DIAGNOSIS — E559 Vitamin D deficiency, unspecified: Secondary | ICD-10-CM | POA: Diagnosis not present

## 2021-03-10 DIAGNOSIS — R338 Other retention of urine: Secondary | ICD-10-CM | POA: Diagnosis not present

## 2021-03-10 DIAGNOSIS — Z86010 Personal history of colonic polyps: Secondary | ICD-10-CM | POA: Diagnosis not present

## 2021-03-10 DIAGNOSIS — I252 Old myocardial infarction: Secondary | ICD-10-CM | POA: Diagnosis not present

## 2021-03-10 DIAGNOSIS — M5136 Other intervertebral disc degeneration, lumbar region: Secondary | ICD-10-CM | POA: Diagnosis not present

## 2021-03-10 DIAGNOSIS — Z466 Encounter for fitting and adjustment of urinary device: Secondary | ICD-10-CM | POA: Diagnosis not present

## 2021-03-10 DIAGNOSIS — Z9181 History of falling: Secondary | ICD-10-CM | POA: Diagnosis not present

## 2021-03-10 DIAGNOSIS — I509 Heart failure, unspecified: Secondary | ICD-10-CM | POA: Diagnosis not present

## 2021-03-10 DIAGNOSIS — N401 Enlarged prostate with lower urinary tract symptoms: Secondary | ICD-10-CM | POA: Diagnosis not present

## 2021-03-14 DIAGNOSIS — F418 Other specified anxiety disorders: Secondary | ICD-10-CM | POA: Diagnosis not present

## 2021-03-14 DIAGNOSIS — N401 Enlarged prostate with lower urinary tract symptoms: Secondary | ICD-10-CM | POA: Diagnosis not present

## 2021-03-14 DIAGNOSIS — Z86011 Personal history of benign neoplasm of the brain: Secondary | ICD-10-CM | POA: Diagnosis not present

## 2021-03-14 DIAGNOSIS — I255 Ischemic cardiomyopathy: Secondary | ICD-10-CM | POA: Diagnosis not present

## 2021-03-14 DIAGNOSIS — I110 Hypertensive heart disease with heart failure: Secondary | ICD-10-CM | POA: Diagnosis not present

## 2021-03-14 DIAGNOSIS — I5030 Unspecified diastolic (congestive) heart failure: Secondary | ICD-10-CM | POA: Diagnosis not present

## 2021-03-14 DIAGNOSIS — N3281 Overactive bladder: Secondary | ICD-10-CM | POA: Diagnosis not present

## 2021-03-14 DIAGNOSIS — G309 Alzheimer's disease, unspecified: Secondary | ICD-10-CM | POA: Diagnosis not present

## 2021-03-14 DIAGNOSIS — F0280 Dementia in other diseases classified elsewhere without behavioral disturbance: Secondary | ICD-10-CM | POA: Diagnosis not present

## 2021-03-14 DIAGNOSIS — E2740 Unspecified adrenocortical insufficiency: Secondary | ICD-10-CM | POA: Diagnosis not present

## 2021-03-14 DIAGNOSIS — G4700 Insomnia, unspecified: Secondary | ICD-10-CM | POA: Diagnosis not present

## 2021-03-14 DIAGNOSIS — F0390 Unspecified dementia without behavioral disturbance: Secondary | ICD-10-CM | POA: Diagnosis not present

## 2021-03-14 DIAGNOSIS — M1990 Unspecified osteoarthritis, unspecified site: Secondary | ICD-10-CM | POA: Diagnosis not present

## 2021-03-14 DIAGNOSIS — I509 Heart failure, unspecified: Secondary | ICD-10-CM | POA: Diagnosis not present

## 2021-03-14 DIAGNOSIS — Z87448 Personal history of other diseases of urinary system: Secondary | ICD-10-CM | POA: Diagnosis not present

## 2021-03-14 DIAGNOSIS — R338 Other retention of urine: Secondary | ICD-10-CM | POA: Diagnosis not present

## 2021-03-14 DIAGNOSIS — J45909 Unspecified asthma, uncomplicated: Secondary | ICD-10-CM | POA: Diagnosis not present

## 2021-03-14 DIAGNOSIS — I2510 Atherosclerotic heart disease of native coronary artery without angina pectoris: Secondary | ICD-10-CM | POA: Diagnosis not present

## 2021-03-14 DIAGNOSIS — J302 Other seasonal allergic rhinitis: Secondary | ICD-10-CM | POA: Diagnosis not present

## 2021-03-14 DIAGNOSIS — E785 Hyperlipidemia, unspecified: Secondary | ICD-10-CM | POA: Diagnosis not present

## 2021-03-14 DIAGNOSIS — N179 Acute kidney failure, unspecified: Secondary | ICD-10-CM | POA: Diagnosis not present

## 2021-03-14 DIAGNOSIS — E039 Hypothyroidism, unspecified: Secondary | ICD-10-CM | POA: Diagnosis not present

## 2021-03-14 DIAGNOSIS — Z86718 Personal history of other venous thrombosis and embolism: Secondary | ICD-10-CM | POA: Diagnosis not present

## 2021-03-14 DIAGNOSIS — N400 Enlarged prostate without lower urinary tract symptoms: Secondary | ICD-10-CM | POA: Diagnosis not present

## 2021-03-14 DIAGNOSIS — Z8679 Personal history of other diseases of the circulatory system: Secondary | ICD-10-CM | POA: Diagnosis not present

## 2021-03-15 DIAGNOSIS — I5030 Unspecified diastolic (congestive) heart failure: Secondary | ICD-10-CM | POA: Diagnosis not present

## 2021-03-15 DIAGNOSIS — I2510 Atherosclerotic heart disease of native coronary artery without angina pectoris: Secondary | ICD-10-CM | POA: Diagnosis not present

## 2021-03-15 DIAGNOSIS — I110 Hypertensive heart disease with heart failure: Secondary | ICD-10-CM | POA: Diagnosis not present

## 2021-03-15 DIAGNOSIS — Z8679 Personal history of other diseases of the circulatory system: Secondary | ICD-10-CM | POA: Diagnosis not present

## 2021-03-15 DIAGNOSIS — F0280 Dementia in other diseases classified elsewhere without behavioral disturbance: Secondary | ICD-10-CM | POA: Diagnosis not present

## 2021-03-15 DIAGNOSIS — G309 Alzheimer's disease, unspecified: Secondary | ICD-10-CM | POA: Diagnosis not present

## 2021-03-16 ENCOUNTER — Other Ambulatory Visit: Payer: Self-pay

## 2021-03-16 ENCOUNTER — Emergency Department (HOSPITAL_COMMUNITY)
Admission: EM | Admit: 2021-03-16 | Discharge: 2021-03-16 | Disposition: A | Discharge status: Home / Self Care | Attending: Emergency Medicine | Admitting: Emergency Medicine

## 2021-03-16 ENCOUNTER — Encounter (HOSPITAL_COMMUNITY): Payer: Self-pay

## 2021-03-16 ENCOUNTER — Emergency Department (HOSPITAL_COMMUNITY)

## 2021-03-16 DIAGNOSIS — E785 Hyperlipidemia, unspecified: Secondary | ICD-10-CM | POA: Diagnosis not present

## 2021-03-16 DIAGNOSIS — Y900 Blood alcohol level of less than 20 mg/100 ml: Secondary | ICD-10-CM | POA: Insufficient documentation

## 2021-03-16 DIAGNOSIS — N183 Chronic kidney disease, stage 3 unspecified: Secondary | ICD-10-CM | POA: Insufficient documentation

## 2021-03-16 DIAGNOSIS — R42 Dizziness and giddiness: Secondary | ICD-10-CM | POA: Diagnosis not present

## 2021-03-16 DIAGNOSIS — E871 Hypo-osmolality and hyponatremia: Secondary | ICD-10-CM | POA: Diagnosis not present

## 2021-03-16 DIAGNOSIS — I110 Hypertensive heart disease with heart failure: Secondary | ICD-10-CM | POA: Diagnosis not present

## 2021-03-16 DIAGNOSIS — Z8616 Personal history of COVID-19: Secondary | ICD-10-CM | POA: Diagnosis not present

## 2021-03-16 DIAGNOSIS — I2510 Atherosclerotic heart disease of native coronary artery without angina pectoris: Secondary | ICD-10-CM | POA: Insufficient documentation

## 2021-03-16 DIAGNOSIS — R58 Hemorrhage, not elsewhere classified: Secondary | ICD-10-CM | POA: Diagnosis not present

## 2021-03-16 DIAGNOSIS — J9811 Atelectasis: Secondary | ICD-10-CM | POA: Diagnosis not present

## 2021-03-16 DIAGNOSIS — M5134 Other intervertebral disc degeneration, thoracic region: Secondary | ICD-10-CM | POA: Diagnosis not present

## 2021-03-16 DIAGNOSIS — N390 Urinary tract infection, site not specified: Secondary | ICD-10-CM | POA: Diagnosis not present

## 2021-03-16 DIAGNOSIS — N1831 Chronic kidney disease, stage 3a: Secondary | ICD-10-CM | POA: Diagnosis not present

## 2021-03-16 DIAGNOSIS — M5136 Other intervertebral disc degeneration, lumbar region: Secondary | ICD-10-CM | POA: Diagnosis not present

## 2021-03-16 DIAGNOSIS — F0390 Unspecified dementia without behavioral disturbance: Secondary | ICD-10-CM | POA: Diagnosis not present

## 2021-03-16 DIAGNOSIS — R531 Weakness: Secondary | ICD-10-CM | POA: Insufficient documentation

## 2021-03-16 DIAGNOSIS — F0280 Dementia in other diseases classified elsewhere without behavioral disturbance: Secondary | ICD-10-CM | POA: Diagnosis not present

## 2021-03-16 DIAGNOSIS — R410 Disorientation, unspecified: Secondary | ICD-10-CM | POA: Diagnosis not present

## 2021-03-16 DIAGNOSIS — Z79899 Other long term (current) drug therapy: Secondary | ICD-10-CM | POA: Insufficient documentation

## 2021-03-16 DIAGNOSIS — I5030 Unspecified diastolic (congestive) heart failure: Secondary | ICD-10-CM | POA: Diagnosis not present

## 2021-03-16 DIAGNOSIS — R4182 Altered mental status, unspecified: Secondary | ICD-10-CM | POA: Diagnosis not present

## 2021-03-16 DIAGNOSIS — Z859 Personal history of malignant neoplasm, unspecified: Secondary | ICD-10-CM | POA: Insufficient documentation

## 2021-03-16 DIAGNOSIS — W19XXXA Unspecified fall, initial encounter: Secondary | ICD-10-CM | POA: Insufficient documentation

## 2021-03-16 DIAGNOSIS — R110 Nausea: Secondary | ICD-10-CM | POA: Diagnosis present

## 2021-03-16 DIAGNOSIS — D352 Benign neoplasm of pituitary gland: Secondary | ICD-10-CM | POA: Diagnosis not present

## 2021-03-16 DIAGNOSIS — I509 Heart failure, unspecified: Secondary | ICD-10-CM | POA: Diagnosis not present

## 2021-03-16 DIAGNOSIS — I491 Atrial premature depolarization: Secondary | ICD-10-CM | POA: Diagnosis not present

## 2021-03-16 DIAGNOSIS — R55 Syncope and collapse: Secondary | ICD-10-CM | POA: Diagnosis not present

## 2021-03-16 DIAGNOSIS — I130 Hypertensive heart and chronic kidney disease with heart failure and stage 1 through stage 4 chronic kidney disease, or unspecified chronic kidney disease: Secondary | ICD-10-CM | POA: Insufficient documentation

## 2021-03-16 DIAGNOSIS — G309 Alzheimer's disease, unspecified: Secondary | ICD-10-CM | POA: Diagnosis not present

## 2021-03-16 DIAGNOSIS — D649 Anemia, unspecified: Secondary | ICD-10-CM | POA: Diagnosis not present

## 2021-03-16 DIAGNOSIS — I129 Hypertensive chronic kidney disease with stage 1 through stage 4 chronic kidney disease, or unspecified chronic kidney disease: Secondary | ICD-10-CM | POA: Diagnosis not present

## 2021-03-16 DIAGNOSIS — Z043 Encounter for examination and observation following other accident: Secondary | ICD-10-CM | POA: Diagnosis not present

## 2021-03-16 DIAGNOSIS — I82401 Acute embolism and thrombosis of unspecified deep veins of right lower extremity: Secondary | ICD-10-CM | POA: Diagnosis not present

## 2021-03-16 DIAGNOSIS — Z8679 Personal history of other diseases of the circulatory system: Secondary | ICD-10-CM | POA: Diagnosis not present

## 2021-03-16 DIAGNOSIS — I87319 Chronic venous hypertension (idiopathic) with ulcer of unspecified lower extremity: Secondary | ICD-10-CM | POA: Diagnosis not present

## 2021-03-16 DIAGNOSIS — E2740 Unspecified adrenocortical insufficiency: Secondary | ICD-10-CM | POA: Diagnosis not present

## 2021-03-16 DIAGNOSIS — Z7982 Long term (current) use of aspirin: Secondary | ICD-10-CM | POA: Insufficient documentation

## 2021-03-16 DIAGNOSIS — R404 Transient alteration of awareness: Secondary | ICD-10-CM | POA: Diagnosis not present

## 2021-03-16 DIAGNOSIS — R001 Bradycardia, unspecified: Secondary | ICD-10-CM | POA: Diagnosis not present

## 2021-03-16 DIAGNOSIS — I255 Ischemic cardiomyopathy: Secondary | ICD-10-CM | POA: Diagnosis not present

## 2021-03-16 LAB — PROTIME-INR
INR: 1.1 (ref 0.8–1.2)
Prothrombin Time: 14.5 seconds (ref 11.4–15.2)

## 2021-03-16 LAB — COMPREHENSIVE METABOLIC PANEL
ALT: 28 U/L (ref 0–44)
AST: 21 U/L (ref 15–41)
Albumin: 2.8 g/dL — ABNORMAL LOW (ref 3.5–5.0)
Alkaline Phosphatase: 67 U/L (ref 38–126)
Anion gap: 7 (ref 5–15)
BUN: 23 mg/dL (ref 8–23)
CO2: 24 mmol/L (ref 22–32)
Calcium: 8.4 mg/dL — ABNORMAL LOW (ref 8.9–10.3)
Chloride: 109 mmol/L (ref 98–111)
Creatinine, Ser: 1.54 mg/dL — ABNORMAL HIGH (ref 0.61–1.24)
GFR, Estimated: 44 mL/min — ABNORMAL LOW (ref >60)
Glucose, Bld: 89 mg/dL (ref 70–99)
Potassium: 3.9 mmol/L (ref 3.5–5.1)
Sodium: 140 mmol/L (ref 135–145)
Total Bilirubin: 1 mg/dL (ref 0.3–1.2)
Total Protein: 5.1 g/dL — ABNORMAL LOW (ref 6.5–8.1)

## 2021-03-16 LAB — URINALYSIS, COMPLETE (UACMP) WITH MICROSCOPIC
Bilirubin Urine: NEGATIVE
Glucose, UA: NEGATIVE mg/dL
Hgb urine dipstick: NEGATIVE
Ketones, ur: NEGATIVE mg/dL
Nitrite: POSITIVE — AB
Protein, ur: NEGATIVE mg/dL
Specific Gravity, Urine: 1.013 (ref 1.005–1.030)
pH: 5 (ref 5.0–8.0)

## 2021-03-16 LAB — CBC WITH DIFFERENTIAL/PLATELET
Abs Immature Granulocytes: 0.06 10*3/uL (ref 0.00–0.07)
Basophils Absolute: 0.1 10*3/uL (ref 0.0–0.1)
Basophils Relative: 1 %
Eosinophils Absolute: 0.3 10*3/uL (ref 0.0–0.5)
Eosinophils Relative: 3 %
HCT: 35.2 % — ABNORMAL LOW (ref 39.0–52.0)
Hemoglobin: 10.9 g/dL — ABNORMAL LOW (ref 13.0–17.0)
Immature Granulocytes: 1 %
Lymphocytes Relative: 17 %
Lymphs Abs: 1.9 10*3/uL (ref 0.7–4.0)
MCH: 28.3 pg (ref 26.0–34.0)
MCHC: 31 g/dL (ref 30.0–36.0)
MCV: 91.4 fL (ref 80.0–100.0)
Monocytes Absolute: 0.9 10*3/uL (ref 0.1–1.0)
Monocytes Relative: 8 %
Neutro Abs: 8.1 10*3/uL — ABNORMAL HIGH (ref 1.7–7.7)
Neutrophils Relative %: 70 %
Platelets: 359 10*3/uL (ref 150–400)
RBC: 3.85 MIL/uL — ABNORMAL LOW (ref 4.22–5.81)
RDW: 18.6 % — ABNORMAL HIGH (ref 11.5–15.5)
WBC: 11.4 10*3/uL — ABNORMAL HIGH (ref 4.0–10.5)
nRBC: 0 % (ref 0.0–0.2)

## 2021-03-16 LAB — RAPID URINE DRUG SCREEN, HOSP PERFORMED
Amphetamines: NOT DETECTED
Barbiturates: NOT DETECTED
Benzodiazepines: POSITIVE — AB
Cocaine: NOT DETECTED
Opiates: NOT DETECTED
Tetrahydrocannabinol: NOT DETECTED

## 2021-03-16 LAB — CBG MONITORING, ED: Glucose-Capillary: 77 mg/dL (ref 70–99)

## 2021-03-16 LAB — ETHANOL: Alcohol, Ethyl (B): <10 mg/dL (ref <10)

## 2021-03-16 MED ORDER — SODIUM CHLORIDE 0.9 % IV BOLUS
1000.0000 mL | Freq: Once | INTRAVENOUS | Status: AC
  Administered 2021-03-16: 1000 mL via INTRAVENOUS

## 2021-03-16 MED ORDER — CEPHALEXIN 500 MG PO CAPS: 500.0000 mg | ORAL_CAPSULE | Freq: Two times a day (BID) | ORAL | 0 refills | Status: AC

## 2021-03-16 MED ORDER — SODIUM CHLORIDE 0.9 % IV SOLN: INTRAVENOUS | Status: DC

## 2021-03-16 MED ORDER — FENTANYL CITRATE (PF) 100 MCG/2ML IJ SOLN
25.0000 ug | Freq: Once | INTRAMUSCULAR | Status: AC
  Administered 2021-03-16: 25 ug via INTRAVENOUS
  Filled 2021-03-16: qty 2

## 2021-03-16 NOTE — ED Provider Notes (Signed)
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  HPI Patient presents from nursing facility/memory care unit after an unwitnessed fall. History is obtained by the patient somewhat, though he does have memory loss, level 5 caveat, and EMS providers. Per report the patient has had increasing falls, requiring placement in a nursing facility.  Today, the patient was found after last being seen about 2 hours prior to discovery.  He was on the ground.  He seemingly complained of lightheadedness, possible syncope when found, but currently denies any pain, complaints, weakness, nausea, lightheadedness. He is not complaining of fever, chills. No nursing report of change in medication, diet, activity.     Past Medical History:  Diagnosis Date   Atrial premature beats    BPH (benign prostatic hyperplasia)    Cataract, right eye    CHF (congestive heart failure) (HCC)    Chronic low back pain    Colon polyp    Coronary artery disease    DDD (degenerative disc disease), lumbar    Dermatitis 11/16   Elevated IgE level 06/30/2018   Hyperlipidemia    Insomnia    Kidney cysts    Memory loss 2015   Non-ischemic cardiomyopathy ([place])    NSTEMI (non-ST elevated myocardial infarction) ([place]) 08/25/2014   Osteoarthritis of right knee    Pituitary tumor 2011   macroadenoma w/ VF compromise-then gamma knife a few years leater for some recurrence   Positive radioallergosorbent test (RAST) 06/30/2018   Status post insertion of drug-eluting stent into left anterior descending (LAD) artery 08/28/15   + nuc study.    Urinary retention    Vitamin D deficiency 01/2008    Patient Active Problem List   Diagnosis Date Noted   SARS-CoV-2 positive 01/27/2021   Bradycardia 01/22/2021   Generalized weakness 01/22/2021   Skin tear of  right hand without complication 45/62/5638   Left leg cellulitis 10/13/2019   Cellulitis 10/13/2019   Other specified disorders of nose and nasal sinuses 09/04/2018   Sleep disturbance 09/04/2018   DVT (deep venous thrombosis) ([place]) 08/22/2018   Pulmonary embolism ([place]) 08/22/2018   CKD (chronic kidney disease) stage 3, GFR 30-59 ml/min 08/22/2018   Anemia 08/22/2018   BPH (benign prostatic hyperplasia) 08/22/2018   Anxiety 08/22/2018   HTN (hypertension) 08/22/2018   HLD (hyperlipidemia) 08/22/2018   Atopic asthma 08/22/2018   VTE (venous thromboembolism) 08/21/2018   Positive radioallergosorbent test (RAST) 06/30/2018   Elevated IgE level 06/30/2018   Hiatal hernia 06/27/2018   Atypical angina ([place]) 08/08/2017   RBBB, anterior fascicular block and incompl posterior fascicular block 08/08/2017   Urinary retention due to benign prostatic hyperplasia 12/05/2016   Overactive bladder 12/05/2016   Acute adrenal insufficiency ([place]) 12/03/2016   AKI (acute kidney injury) ([place])    Right bundle branch block    Orthostatic hypotension    CAD (coronary artery disease)    History of ST elevation myocardial infarction (STEMI)    Chronic bilateral low back pain without sciatica    Hyponatremia    Leukocytosis    Falls 12/02/2016   Diarrhea 12/02/2016   Syncope 12/02/2016   Nasal fracture 12/02/2016   Angina effort    Abnormal nuclear stress test    NSTEMI (non-ST elevated myocardial infarction) ([place]) 08/25/2014   Basal cell  carcinoma 07/15/2014   Open wound of nose with complication 58/06/9832   Disease of upper respiratory system 07/15/2014   Chronic infection of sinus 07/15/2014   Dyssomnia 07/15/2014   Dermatologic disease 04/05/2014   CA of skin 04/05/2014   Pituitary macroadenoma with extrasellar extension ([place]) 01/13/2014   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   Mild cognitive disorder 01/13/2014   PAC (premature atrial contraction) 12/10/2013    APC (atrial premature contractions) 12/10/2013   HYPERCHOLESTEROLEMIA  IIA 02/10/2009   CAD, NATIVE VESSEL 02/10/2009   Cardiomyopathy ([place]) 02/10/2009   BRADYCARDIA 02/10/2009   Cardiac conduction disorder 02/10/2009   CAD in native artery 02/10/2009    Past Surgical History:  Procedure Laterality Date   ARTHROSCOPIC SURGERY     BACK SURGERY     CARDIAC CATHETERIZATION  07/27/2015   Dr Aundra Dubin; oLAD 95%, mLAD 50%, CFX stent OK, OM1 60%, OM2 99%, pPLOM 50%, RCA irregular, AM2 70%, EF 55-60%, no regional wall motion abnormalities.      CARDIAC CATHETERIZATION N/A 07/28/2015   Procedure: Left Heart Cath and Coronary Angiography;  Surgeon: [name], MD;  Location: [place] [facility];  Service: Cardiovascular;  Laterality: N/A;   CARDIAC CATHETERIZATION N/A 07/28/2015   Procedure: Coronary Stent Intervention;  Surgeon: Troy Sine, MD;  Location: [place] [facility];  Service: Cardiovascular;  Laterality: N/A;   CATARACT EXTRACTION     CORONARY ANGIOPLASTY WITH STENT PLACEMENT  07/27/2015   XIENCE ALPINE RX 3.0X33 DES to the LAD, 95%>>0   HAMMER  BUNION TOE SURGERY     HAND SURGERY Left    LEFT HEART CATHETERIZATION WITH CORONARY ANGIOGRAM N/A 08/25/2014   Procedure: LEFT HEART CATHETERIZATION WITH CORONARY ANGIOGRAM;  Surgeon: Burnell Blanks, MD;  Location: [facility] CATH LAB;  Service: Cardiovascular;  Laterality: N/A;   PITUITARY SURGERY     12/01/09 Dr. Wilburn Cornelia and [name].   RIGHT/LEFT HEART CATH AND CORONARY ANGIOGRAPHY N/A 03/19/2018   Procedure: RIGHT/LEFT HEART CATH AND CORONARY ANGIOGRAPHY;  Surgeon: [name], MD;  Location: [place] [facility];  Service: Cardiovascular;  Laterality: N/A;   RIGHT/LEFT HEART CATH AND CORONARY ANGIOGRAPHY N/A 03/13/2019   Procedure: RIGHT/LEFT HEART CATH AND CORONARY ANGIOGRAPHY;  Surgeon: [name], MD;  Location: [place] [facility] LAB;  Service: Cardiovascular;  Laterality: N/A;       Family History  Problem Relation  Age of Onset   Congestive Heart Failure Father 27   Other Mother 33   Coronary artery disease Brother    Other Sister 53       polio   Heart attack Brother        MI/ASCAD 2006   COPD Sister 41    Social History   Tobacco Use   Smoking status: Never   Smokeless tobacco: Never  Vaping Use   Vaping Use: Never used  Substance Use Topics   Alcohol use: Not Currently   Drug use: No    Home Medications Prior to Admission medications   Medication Sig Start Date End Date Taking? Authorizing Provider  albuterol (VENTOLIN HFA) 108 (90 Base) MCG/ACT inhaler Inhale 2 puffs into the lungs every 6 (six) hours as needed for wheezing or shortness of breath. 02/08/21   Medina-Vargas, Monina C, NP  amiodarone (PACERONE) 100 MG tablet Take 1 tablet (100 mg total) by mouth daily. 02/08/21   Medina-Vargas, Monina C, NP  ascorbic acid (VITAMIN C) 500 MG tablet Take 1 tablet (500 mg  total) by mouth daily. 10/16/19   Elie Confer, MD  aspirin EC 81 MG tablet Take 1 tablet (81 mg total) by mouth daily. 02/08/21   Medina-Vargas, Monina C, NP  buPROPion (WELLBUTRIN XL) 150 MG 24 hr tablet Take 1 tablet (150 mg total) by mouth daily. 02/08/21   Medina-Vargas, Monina C, NP  cabergoline (DOSTINEX) 0.5 MG tablet Take 1 tablet (0.5 mg total) by mouth every Wednesday. 02/08/21   Medina-Vargas, Monina C, NP  Evolocumab (REPATHA) 140 MG/ML SOSY Inject 1 mL into the skin every 14 (fourteen) days.     [provider]  ezetimibe (ZETIA) 10 MG tablet Take 1 tablet (10 mg total) by mouth daily. 02/08/21   Medina-Vargas, Monina C, NP  finasteride (PROSCAR) 5 MG tablet Take 1 tablet (5 mg total) by mouth daily. 02/08/21   Medina-Vargas, Monina C, NP  fluticasone (FLONASE) 50 MCG/ACT nasal spray Place 2 sprays into both nostrils daily. 02/08/21   Medina-Vargas, Monina C, NP  fluticasone-salmeterol (ADVAIR HFA) 230-21 MCG/ACT inhaler Inhale 2 puffs into the lungs 2 (two) times daily. 02/08/21   Medina-Vargas, Monina C,  NP  galantamine (RAZADYNE ER) 8 MG 24 hr capsule Take 1 capsule (8 mg total) by mouth every morning. 02/08/21   Medina-Vargas, Monina C, NP  hydrocortisone (CORTEF) 20 MG tablet Take 1-2 tablets (20-40 mg total) by mouth See admin instructions. 40 mg every morning, 20 mg every evening 02/08/21   Medina-Vargas, Monina C, NP  icosapent Ethyl (VASCEPA) 1 g capsule TAKE 2 CAPSULES(2 GRAMS) BY MOUTH TWICE DAILY 02/08/21   Medina-Vargas, Monina C, NP  levothyroxine (SYNTHROID) 150 MCG tablet Take 1 tablet (150 mcg total) by mouth daily before breakfast. 02/08/21   Medina-Vargas, Monina C, NP  MYRBETRIQ 50 MG TB24 tablet Take 1 tablet (50 mg total) by mouth at bedtime. 02/08/21   Medina-Vargas, Monina C, NP  nitroGLYCERIN (NITROSTAT) 0.4 MG SL tablet Place 1 tablet (0.4 mg total) under the tongue every 5 (five) minutes as needed for chest pain. 02/08/21   Medina-Vargas, Monina C, NP  potassium chloride (KLOR-CON) 10 MEQ tablet Take 2 tablets (20 mEq total) by mouth daily. 02/08/21   Medina-Vargas, Monina C, NP  QUEtiapine (SEROQUEL) 25 MG tablet Take 1 tablet (25 mg total) by mouth at bedtime for 5 days. 01/26/21 01/31/21  Terrilee Croak, MD  ranolazine (RANEXA) 500 MG 12 hr tablet Take 1 tablet (500 mg total) by mouth 2 (two) times daily. 02/08/21   Medina-Vargas, Monina C, NP  senna-docusate (SENOKOT-S) 8.6-50 MG tablet Take 1 tablet by mouth at bedtime as needed for mild constipation. 01/26/21   Terrilee Croak, MD  testosterone cypionate (DEPOTESTOTERONE CYPIONATE) 200 MG/ML injection Inject 100 mg into the muscle every 21 ( twenty-one) days.     [provider]  torsemide (DEMADEX) 10 MG tablet Take 1 tablet (10 mg total) by mouth daily as needed (pedal edema). 02/08/21   Medina-Vargas, Monina C, NP  triamcinolone cream (KENALOG) 0.1 % Apply 1 application topically 2 (two) times daily as needed (irritation).     [provider]  carvedilol (COREG) 12.5 MG tablet Take 1 tablet (12.5 mg total) by mouth 2  (two) times daily with a meal. 07/22/20 11/30/20  [name], MD  nebivolol (BYSTOLIC) 2.5 MG tablet TAKE 1/2 TABLET(1.25 MG) BY MOUTH DAILY 10/03/20 11/30/20  [name], MD    Allergies    Sulfa antibiotics, Sulfamethoxazole, Sulfonamide derivatives, Bee venom, Flonase [fluticasone], and Tamsulosin  Review of Systems  Review of Systems  Physical Exam Updated Vital Signs BP 140/80   Pulse 60   Temp 98.4 F (36.9 C) (Oral)   Resp 17   SpO2 99%   Physical Exam  ED Results / Procedures / Treatments   Labs (all labs ordered are listed, but only abnormal results are displayed) Labs Reviewed  COMPREHENSIVE METABOLIC PANEL - Abnormal; Notable for the following components:      Result Value   Creatinine, Ser 1.54 (*)    Calcium 8.4 (*)    Total Protein 5.1 (*)    Albumin 2.8 (*)    GFR, Estimated 44 (*)    All other components within normal limits  RAPID URINE DRUG SCREEN, HOSP PERFORMED - Abnormal; Notable for the following components:   Benzodiazepines POSITIVE (*)    All other components within normal limits  CBC WITH DIFFERENTIAL/PLATELET - Abnormal; Notable for the following components:   WBC 11.4 (*)    RBC 3.85 (*)    Hemoglobin 10.9 (*)    HCT 35.2 (*)    RDW 18.6 (*)    Neutro Abs 8.1 (*)    All other components within normal limits  URINALYSIS, COMPLETE (UACMP) WITH MICROSCOPIC - Abnormal; Notable for the following components:   APPearance HAZY (*)    Nitrite POSITIVE (*)    Leukocytes,Ua SMALL (*)    Bacteria, UA MANY (*)    All other components within normal limits  ETHANOL  PROTIME-INR  CBG MONITORING, ED  CBG MONITORING, ED    EMS rhythm strip rate 76, frequent PVC, sinus, abnormal   EKG EKG Interpretation  Date/Time:  Thursday March 16 2021 09:37:49 EDT Ventricular Rate:  66 PR Interval:  195 QRS Duration: 136 QT Interval:  446 QTC Calculation: 468 R Axis:   5 Text Interpretation: Sinus rhythm Nonspecific intraventricular conduction  delay Minimal ST depression, anterolateral leads Artifact Abnormal ECG Confirmed by Carmin Muskrat [phone]) on 03/16/2021 9:55:10 AM  Radiology DG Thoracic Spine 2 View  Result Date: 03/16/2021 CLINICAL DATA:  Fall EXAM: THORACIC SPINE 2 VIEWS COMPARISON:  Chest CT 08/21/2018 FINDINGS: There is no radiographically evident thoracic spine fracture. Unchanged bridging osteophyte formation. Unchanged mild multilevel degenerative disc disease throughout the lumbar spine. Unchanged anterior wedging of T12. IMPRESSION: No radiographically evident acute thoracic spine fracture. Unchanged chronic anterior wedging of T12. Mild multilevel degenerative disc disease. Electronically Signed   By: Maurine Simmering   On: 03/16/2021 11:51   DG Lumbar Spine 2-3 Views  Result Date: 03/16/2021 CLINICAL DATA:  Fall EXAM: LUMBAR SPINE - 2-3 VIEW COMPARISON:  CT lumbar spine 01/22/2021 FINDINGS: There are 5 non-rib-bearing lumbar vertebrae. Slight levoconvex curvature. There is unchanged mild anterior wedging of T12 and L1. There is no evidence of new compression deformity. There is severe lower lumbar facet arthritis. Unchanged moderate disc height loss at L4-L5 and L5-S1. Unchanged bridging osteophyte formation. Aortic atherosclerosis. IMPRESSION: No evidence of acute lumbar spine fracture. Unchanged chronic anterior wedging of T12 and L1. Unchanged multilevel degenerative disc disease, worst at L4-5 and L5-S1 with severe lower lumbar predominant facet arthritis. Electronically Signed   By: Maurine Simmering   On: 03/16/2021 11:49   CT HEAD WO CONTRAST  Result Date: 03/16/2021 CLINICAL DATA:  Mental status change. EXAM: CT HEAD WITHOUT CONTRAST TECHNIQUE: Contiguous axial images were obtained from the base of the skull through the vertex without intravenous contrast. COMPARISON:  CT head 01/22/2021 FINDINGS: Brain: Generalized atrophy and ventricular enlargement, stable. Mega cisterna magna, stable. Moderate white  matter changes with  diffuse white matter hypodensity, stable and unchanged from the prior study. Negative for acute infarct, hemorrhage, mass. Prior pituitary surgery. Enlarged sella without recurrent mass in the sella. Vascular: Negative for hyperdense vessel Skull: Negative Sinuses/Orbits: Paranasal sinuses clear. Bilateral cataract extraction. Other: None IMPRESSION: No acute abnormality. Atrophy and chronic microvascular ischemic change throughout the white matter Prior pituitary surgery without recurrent mass lesion. Electronically Signed   By: Franchot Gallo M.D.   On: 03/16/2021 12:33   DG Chest Port 1 View  Result Date: 03/16/2021 CLINICAL DATA:  Un witnessed fall. Altered level of consciousness. History of CHF. EXAM: PORTABLE CHEST 1 VIEW COMPARISON:  01/22/2021 FINDINGS: The cardiac silhouette remains borderline enlarged. Lung volumes remain low with similar mild prominence of the interstitial markings but no overt pulmonary edema. Mild bibasilar opacities likely reflect atelectasis. No sizable pleural effusion or pneumothorax is identified. No acute osseous abnormality is seen. IMPRESSION: Low lung volumes with mild bibasilar atelectasis. Electronically Signed   By: Logan Bores M.D.   On: 03/16/2021 10:36    Procedures Procedures   Medications Ordered in ED Medications  sodium chloride 0.9 % bolus 1,000 mL (0 mLs Intravenous Stopped 03/16/21 1319)    And  0.9 %  sodium chloride infusion ( Intravenous New Bag/Given 03/16/21 1045)  fentaNYL (SUBLIMAZE) injection 25 mcg (25 mcg Intravenous Given 03/16/21 1103)    ED Course  I have reviewed the triage vital signs and the nursing notes.  Pertinent labs & imaging results that were available during my care of the patient were reviewed by me and considered in my medical decision making (see chart for details).  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    3:30 PM I spoke with patient's wife on the phone.  She notes that the patient is indeed enrolled in hospice care, and she is amenable to him returning to his nursing facility with diagnosis of urinary tract infection, likely contributing to his unsteadiness and fall. Other findings reviewed as well including CT scans, x-rays, none with acute alarming findings.  Labs, generally reassuring aside from urinary tract infection, as above.  MDM Rules/Calculators/A&P MDM Number of Diagnoses or Management Options Fall, initial encounter: new, needed workup Lower urinary tract infectious disease: new, needed workup   Amount and/or Complexity of Data Reviewed Clinical lab tests: ordered and reviewed Tests in the radiology section of CPT: ordered and reviewed Tests in the medicine section of CPT: reviewed and ordered Decide to obtain previous medical records or to obtain history from someone other than the patient: yes Obtain history from someone other than the patient: yes Review and summarize past medical records: yes Independent visualization of images, tracings, or specimens: yes  Risk of Complications, Morbidity, and/or Mortality Presenting problems: high Diagnostic procedures: high Management options: high  Critical Care Total time providing critical care: < 30 minutes  Patient Progress Patient progress: stable   Final Clinical Impression(s) / ED Diagnoses Final diagnoses:  Fall, initial encounter  Lower urinary tract infectious disease    Rx / DC Orders ED Discharge Orders          Ordered    cephALEXin (KEFLEX) 500 MG capsule  2 times daily        03/16/21 1531             Carmin Muskrat,  MD 03/16/21 1532

## 2021-03-16 NOTE — Discharge Instructions (Addendum)
As discussed, you have been diagnosed with a urinary tract infection.  This likely contributed to your unsteadiness and fall.  Your other studies were reassuring, but if you develop new, or concerning changes do not hesitate to return here for additional evaluation.

## 2021-03-16 NOTE — ED Notes (Signed)
Patient transported to CT 

## 2021-03-16 NOTE — ED Notes (Addendum)
Patient transported to X-ray 

## 2021-03-16 NOTE — ED Notes (Signed)
Pt has repeatedly been calling with his call bell every few minutes for different reasons. He needed his glasses (not with him), needed his hearing aids put back in (he took them out), wants to leave & be D/C (this RN informed him that we are waiting on doctors orders), wants to speak with a physician (EDP Vanita Panda made aware), he wanted his socks located, etc. All requests has been appeased & pt continues to call very frequently.

## 2021-03-16 NOTE — ED Triage Notes (Signed)
Pt BIB [facility] from a memory care unit d/t being found on the floor from an unwitnessed fall. Pt verbal & oriented upon arrival, endorses recalling LOC & hitting his head & being on the floor for approx. 1 hour before he was found. EMS reports prequent PVC on the monitor & 18G PIV in Lft FA.

## 2021-03-17 DIAGNOSIS — I2510 Atherosclerotic heart disease of native coronary artery without angina pectoris: Secondary | ICD-10-CM | POA: Diagnosis not present

## 2021-03-17 DIAGNOSIS — G309 Alzheimer's disease, unspecified: Secondary | ICD-10-CM | POA: Diagnosis not present

## 2021-03-17 DIAGNOSIS — I5030 Unspecified diastolic (congestive) heart failure: Secondary | ICD-10-CM | POA: Diagnosis not present

## 2021-03-17 DIAGNOSIS — F0280 Dementia in other diseases classified elsewhere without behavioral disturbance: Secondary | ICD-10-CM | POA: Diagnosis not present

## 2021-03-17 DIAGNOSIS — Z8679 Personal history of other diseases of the circulatory system: Secondary | ICD-10-CM | POA: Diagnosis not present

## 2021-03-17 DIAGNOSIS — I110 Hypertensive heart disease with heart failure: Secondary | ICD-10-CM | POA: Diagnosis not present

## 2021-03-20 DIAGNOSIS — I2510 Atherosclerotic heart disease of native coronary artery without angina pectoris: Secondary | ICD-10-CM | POA: Diagnosis not present

## 2021-03-20 DIAGNOSIS — G309 Alzheimer's disease, unspecified: Secondary | ICD-10-CM | POA: Diagnosis not present

## 2021-03-20 DIAGNOSIS — Z8679 Personal history of other diseases of the circulatory system: Secondary | ICD-10-CM | POA: Diagnosis not present

## 2021-03-20 DIAGNOSIS — I5030 Unspecified diastolic (congestive) heart failure: Secondary | ICD-10-CM | POA: Diagnosis not present

## 2021-03-20 DIAGNOSIS — I110 Hypertensive heart disease with heart failure: Secondary | ICD-10-CM | POA: Diagnosis not present

## 2021-03-20 DIAGNOSIS — F0280 Dementia in other diseases classified elsewhere without behavioral disturbance: Secondary | ICD-10-CM | POA: Diagnosis not present

## 2021-03-21 DIAGNOSIS — G309 Alzheimer's disease, unspecified: Secondary | ICD-10-CM | POA: Diagnosis not present

## 2021-03-21 DIAGNOSIS — I5030 Unspecified diastolic (congestive) heart failure: Secondary | ICD-10-CM | POA: Diagnosis not present

## 2021-03-21 DIAGNOSIS — Z8679 Personal history of other diseases of the circulatory system: Secondary | ICD-10-CM | POA: Diagnosis not present

## 2021-03-21 DIAGNOSIS — I2510 Atherosclerotic heart disease of native coronary artery without angina pectoris: Secondary | ICD-10-CM | POA: Diagnosis not present

## 2021-03-21 DIAGNOSIS — F0280 Dementia in other diseases classified elsewhere without behavioral disturbance: Secondary | ICD-10-CM | POA: Diagnosis not present

## 2021-03-21 DIAGNOSIS — I110 Hypertensive heart disease with heart failure: Secondary | ICD-10-CM | POA: Diagnosis not present

## 2021-03-29 DIAGNOSIS — I5030 Unspecified diastolic (congestive) heart failure: Secondary | ICD-10-CM | POA: Diagnosis not present

## 2021-03-29 DIAGNOSIS — I2510 Atherosclerotic heart disease of native coronary artery without angina pectoris: Secondary | ICD-10-CM | POA: Diagnosis not present

## 2021-03-29 DIAGNOSIS — I110 Hypertensive heart disease with heart failure: Secondary | ICD-10-CM | POA: Diagnosis not present

## 2021-03-29 DIAGNOSIS — F0280 Dementia in other diseases classified elsewhere without behavioral disturbance: Secondary | ICD-10-CM | POA: Diagnosis not present

## 2021-03-29 DIAGNOSIS — Z8679 Personal history of other diseases of the circulatory system: Secondary | ICD-10-CM | POA: Diagnosis not present

## 2021-03-29 DIAGNOSIS — G309 Alzheimer's disease, unspecified: Secondary | ICD-10-CM | POA: Diagnosis not present

## 2021-03-31 DIAGNOSIS — Z86718 Personal history of other venous thrombosis and embolism: Secondary | ICD-10-CM | POA: Diagnosis not present

## 2021-03-31 DIAGNOSIS — F418 Other specified anxiety disorders: Secondary | ICD-10-CM | POA: Diagnosis not present

## 2021-03-31 DIAGNOSIS — J302 Other seasonal allergic rhinitis: Secondary | ICD-10-CM | POA: Diagnosis not present

## 2021-03-31 DIAGNOSIS — N400 Enlarged prostate without lower urinary tract symptoms: Secondary | ICD-10-CM | POA: Diagnosis not present

## 2021-03-31 DIAGNOSIS — Z8679 Personal history of other diseases of the circulatory system: Secondary | ICD-10-CM | POA: Diagnosis not present

## 2021-03-31 DIAGNOSIS — I110 Hypertensive heart disease with heart failure: Secondary | ICD-10-CM | POA: Diagnosis not present

## 2021-03-31 DIAGNOSIS — E2740 Unspecified adrenocortical insufficiency: Secondary | ICD-10-CM | POA: Diagnosis not present

## 2021-03-31 DIAGNOSIS — E785 Hyperlipidemia, unspecified: Secondary | ICD-10-CM | POA: Diagnosis not present

## 2021-03-31 DIAGNOSIS — Z86011 Personal history of benign neoplasm of the brain: Secondary | ICD-10-CM | POA: Diagnosis not present

## 2021-03-31 DIAGNOSIS — I255 Ischemic cardiomyopathy: Secondary | ICD-10-CM | POA: Diagnosis not present

## 2021-03-31 DIAGNOSIS — Z87448 Personal history of other diseases of urinary system: Secondary | ICD-10-CM | POA: Diagnosis not present

## 2021-03-31 DIAGNOSIS — E039 Hypothyroidism, unspecified: Secondary | ICD-10-CM | POA: Diagnosis not present

## 2021-03-31 DIAGNOSIS — J45909 Unspecified asthma, uncomplicated: Secondary | ICD-10-CM | POA: Diagnosis not present

## 2021-03-31 DIAGNOSIS — N3281 Overactive bladder: Secondary | ICD-10-CM | POA: Diagnosis not present

## 2021-03-31 DIAGNOSIS — G4700 Insomnia, unspecified: Secondary | ICD-10-CM | POA: Diagnosis not present

## 2021-03-31 DIAGNOSIS — I5030 Unspecified diastolic (congestive) heart failure: Secondary | ICD-10-CM | POA: Diagnosis not present

## 2021-03-31 DIAGNOSIS — M1990 Unspecified osteoarthritis, unspecified site: Secondary | ICD-10-CM | POA: Diagnosis not present

## 2021-03-31 DIAGNOSIS — F0280 Dementia in other diseases classified elsewhere without behavioral disturbance: Secondary | ICD-10-CM | POA: Diagnosis not present

## 2021-03-31 DIAGNOSIS — I2510 Atherosclerotic heart disease of native coronary artery without angina pectoris: Secondary | ICD-10-CM | POA: Diagnosis not present

## 2021-03-31 DIAGNOSIS — G309 Alzheimer's disease, unspecified: Secondary | ICD-10-CM | POA: Diagnosis not present

## 2021-04-04 DIAGNOSIS — G309 Alzheimer's disease, unspecified: Secondary | ICD-10-CM | POA: Diagnosis not present

## 2021-04-04 DIAGNOSIS — Z8679 Personal history of other diseases of the circulatory system: Secondary | ICD-10-CM | POA: Diagnosis not present

## 2021-04-04 DIAGNOSIS — I2510 Atherosclerotic heart disease of native coronary artery without angina pectoris: Secondary | ICD-10-CM | POA: Diagnosis not present

## 2021-04-04 DIAGNOSIS — I110 Hypertensive heart disease with heart failure: Secondary | ICD-10-CM | POA: Diagnosis not present

## 2021-04-04 DIAGNOSIS — F0280 Dementia in other diseases classified elsewhere without behavioral disturbance: Secondary | ICD-10-CM | POA: Diagnosis not present

## 2021-04-04 DIAGNOSIS — I5030 Unspecified diastolic (congestive) heart failure: Secondary | ICD-10-CM | POA: Diagnosis not present

## 2021-04-05 DIAGNOSIS — I2510 Atherosclerotic heart disease of native coronary artery without angina pectoris: Secondary | ICD-10-CM | POA: Diagnosis not present

## 2021-04-05 DIAGNOSIS — F0280 Dementia in other diseases classified elsewhere without behavioral disturbance: Secondary | ICD-10-CM | POA: Diagnosis not present

## 2021-04-05 DIAGNOSIS — I110 Hypertensive heart disease with heart failure: Secondary | ICD-10-CM | POA: Diagnosis not present

## 2021-04-05 DIAGNOSIS — Z8679 Personal history of other diseases of the circulatory system: Secondary | ICD-10-CM | POA: Diagnosis not present

## 2021-04-05 DIAGNOSIS — I5030 Unspecified diastolic (congestive) heart failure: Secondary | ICD-10-CM | POA: Diagnosis not present

## 2021-04-05 DIAGNOSIS — G309 Alzheimer's disease, unspecified: Secondary | ICD-10-CM | POA: Diagnosis not present

## 2021-04-12 DIAGNOSIS — E2740 Unspecified adrenocortical insufficiency: Secondary | ICD-10-CM | POA: Diagnosis not present

## 2021-04-12 DIAGNOSIS — D649 Anemia, unspecified: Secondary | ICD-10-CM | POA: Diagnosis not present

## 2021-04-12 DIAGNOSIS — E785 Hyperlipidemia, unspecified: Secondary | ICD-10-CM | POA: Diagnosis not present

## 2021-04-12 DIAGNOSIS — J45909 Unspecified asthma, uncomplicated: Secondary | ICD-10-CM | POA: Diagnosis not present

## 2021-04-12 DIAGNOSIS — D751 Secondary polycythemia: Secondary | ICD-10-CM | POA: Diagnosis not present

## 2021-04-12 DIAGNOSIS — E039 Hypothyroidism, unspecified: Secondary | ICD-10-CM | POA: Diagnosis not present

## 2021-04-12 DIAGNOSIS — F0390 Unspecified dementia without behavioral disturbance: Secondary | ICD-10-CM | POA: Diagnosis not present

## 2021-04-12 DIAGNOSIS — I951 Orthostatic hypotension: Secondary | ICD-10-CM | POA: Diagnosis not present

## 2021-04-12 DIAGNOSIS — N1831 Chronic kidney disease, stage 3a: Secondary | ICD-10-CM | POA: Diagnosis not present

## 2021-04-12 DIAGNOSIS — E230 Hypopituitarism: Secondary | ICD-10-CM | POA: Diagnosis not present

## 2021-04-12 DIAGNOSIS — D352 Benign neoplasm of pituitary gland: Secondary | ICD-10-CM | POA: Diagnosis not present

## 2021-04-14 DIAGNOSIS — F0280 Dementia in other diseases classified elsewhere without behavioral disturbance: Secondary | ICD-10-CM | POA: Diagnosis not present

## 2021-04-14 DIAGNOSIS — I5030 Unspecified diastolic (congestive) heart failure: Secondary | ICD-10-CM | POA: Diagnosis not present

## 2021-04-14 DIAGNOSIS — I2510 Atherosclerotic heart disease of native coronary artery without angina pectoris: Secondary | ICD-10-CM | POA: Diagnosis not present

## 2021-04-14 DIAGNOSIS — G309 Alzheimer's disease, unspecified: Secondary | ICD-10-CM | POA: Diagnosis not present

## 2021-04-14 DIAGNOSIS — Z8679 Personal history of other diseases of the circulatory system: Secondary | ICD-10-CM | POA: Diagnosis not present

## 2021-04-14 DIAGNOSIS — I110 Hypertensive heart disease with heart failure: Secondary | ICD-10-CM | POA: Diagnosis not present

## 2021-04-17 DIAGNOSIS — I5030 Unspecified diastolic (congestive) heart failure: Secondary | ICD-10-CM | POA: Diagnosis not present

## 2021-04-17 DIAGNOSIS — Z8679 Personal history of other diseases of the circulatory system: Secondary | ICD-10-CM | POA: Diagnosis not present

## 2021-04-17 DIAGNOSIS — I2510 Atherosclerotic heart disease of native coronary artery without angina pectoris: Secondary | ICD-10-CM | POA: Diagnosis not present

## 2021-04-17 DIAGNOSIS — F0280 Dementia in other diseases classified elsewhere without behavioral disturbance: Secondary | ICD-10-CM | POA: Diagnosis not present

## 2021-04-17 DIAGNOSIS — I110 Hypertensive heart disease with heart failure: Secondary | ICD-10-CM | POA: Diagnosis not present

## 2021-04-17 DIAGNOSIS — G309 Alzheimer's disease, unspecified: Secondary | ICD-10-CM | POA: Diagnosis not present

## 2021-04-18 DIAGNOSIS — F0280 Dementia in other diseases classified elsewhere without behavioral disturbance: Secondary | ICD-10-CM | POA: Diagnosis not present

## 2021-04-18 DIAGNOSIS — I110 Hypertensive heart disease with heart failure: Secondary | ICD-10-CM | POA: Diagnosis not present

## 2021-04-18 DIAGNOSIS — I2510 Atherosclerotic heart disease of native coronary artery without angina pectoris: Secondary | ICD-10-CM | POA: Diagnosis not present

## 2021-04-18 DIAGNOSIS — Z8679 Personal history of other diseases of the circulatory system: Secondary | ICD-10-CM | POA: Diagnosis not present

## 2021-04-18 DIAGNOSIS — I5030 Unspecified diastolic (congestive) heart failure: Secondary | ICD-10-CM | POA: Diagnosis not present

## 2021-04-18 DIAGNOSIS — G309 Alzheimer's disease, unspecified: Secondary | ICD-10-CM | POA: Diagnosis not present

## 2021-04-20 DIAGNOSIS — F0280 Dementia in other diseases classified elsewhere without behavioral disturbance: Secondary | ICD-10-CM | POA: Diagnosis not present

## 2021-04-20 DIAGNOSIS — I2510 Atherosclerotic heart disease of native coronary artery without angina pectoris: Secondary | ICD-10-CM | POA: Diagnosis not present

## 2021-04-20 DIAGNOSIS — Z8679 Personal history of other diseases of the circulatory system: Secondary | ICD-10-CM | POA: Diagnosis not present

## 2021-04-20 DIAGNOSIS — I110 Hypertensive heart disease with heart failure: Secondary | ICD-10-CM | POA: Diagnosis not present

## 2021-04-20 DIAGNOSIS — I5030 Unspecified diastolic (congestive) heart failure: Secondary | ICD-10-CM | POA: Diagnosis not present

## 2021-04-20 DIAGNOSIS — G309 Alzheimer's disease, unspecified: Secondary | ICD-10-CM | POA: Diagnosis not present

## 2021-04-22 DIAGNOSIS — I5030 Unspecified diastolic (congestive) heart failure: Secondary | ICD-10-CM | POA: Diagnosis not present

## 2021-04-22 DIAGNOSIS — F0280 Dementia in other diseases classified elsewhere without behavioral disturbance: Secondary | ICD-10-CM | POA: Diagnosis not present

## 2021-04-22 DIAGNOSIS — I2510 Atherosclerotic heart disease of native coronary artery without angina pectoris: Secondary | ICD-10-CM | POA: Diagnosis not present

## 2021-04-22 DIAGNOSIS — I110 Hypertensive heart disease with heart failure: Secondary | ICD-10-CM | POA: Diagnosis not present

## 2021-04-22 DIAGNOSIS — G309 Alzheimer's disease, unspecified: Secondary | ICD-10-CM | POA: Diagnosis not present

## 2021-04-22 DIAGNOSIS — Z8679 Personal history of other diseases of the circulatory system: Secondary | ICD-10-CM | POA: Diagnosis not present

## 2021-04-24 DIAGNOSIS — F0280 Dementia in other diseases classified elsewhere without behavioral disturbance: Secondary | ICD-10-CM | POA: Diagnosis not present

## 2021-04-24 DIAGNOSIS — I110 Hypertensive heart disease with heart failure: Secondary | ICD-10-CM | POA: Diagnosis not present

## 2021-04-24 DIAGNOSIS — I2510 Atherosclerotic heart disease of native coronary artery without angina pectoris: Secondary | ICD-10-CM | POA: Diagnosis not present

## 2021-04-24 DIAGNOSIS — G309 Alzheimer's disease, unspecified: Secondary | ICD-10-CM | POA: Diagnosis not present

## 2021-04-24 DIAGNOSIS — I5030 Unspecified diastolic (congestive) heart failure: Secondary | ICD-10-CM | POA: Diagnosis not present

## 2021-04-24 DIAGNOSIS — Z8679 Personal history of other diseases of the circulatory system: Secondary | ICD-10-CM | POA: Diagnosis not present

## 2021-05-01 DEATH — deceased
